# Patient Record
Sex: Female | Born: 1955 | Race: White | Hispanic: No | Marital: Married | Smoking: Former smoker
Health system: Southern US, Community
[De-identification: ages and names within clinical notes are randomized; demographics above are authoritative.]

## PROBLEM LIST (undated history)

## (undated) DIAGNOSIS — D649 Anemia, unspecified: Secondary | ICD-10-CM

## (undated) DIAGNOSIS — E538 Deficiency of other specified B group vitamins: Secondary | ICD-10-CM

## (undated) DIAGNOSIS — R0600 Dyspnea, unspecified: Secondary | ICD-10-CM

## (undated) DIAGNOSIS — Z9221 Personal history of antineoplastic chemotherapy: Secondary | ICD-10-CM

## (undated) DIAGNOSIS — T7840XA Allergy, unspecified, initial encounter: Secondary | ICD-10-CM

## (undated) DIAGNOSIS — J189 Pneumonia, unspecified organism: Secondary | ICD-10-CM

## (undated) DIAGNOSIS — Z8489 Family history of other specified conditions: Secondary | ICD-10-CM

## (undated) DIAGNOSIS — E063 Autoimmune thyroiditis: Secondary | ICD-10-CM

## (undated) DIAGNOSIS — M549 Dorsalgia, unspecified: Secondary | ICD-10-CM

## (undated) DIAGNOSIS — T451X5A Adverse effect of antineoplastic and immunosuppressive drugs, initial encounter: Secondary | ICD-10-CM

## (undated) DIAGNOSIS — K5790 Diverticulosis of intestine, part unspecified, without perforation or abscess without bleeding: Secondary | ICD-10-CM

## (undated) DIAGNOSIS — M545 Low back pain, unspecified: Secondary | ICD-10-CM

## (undated) DIAGNOSIS — F32A Depression, unspecified: Secondary | ICD-10-CM

## (undated) DIAGNOSIS — C3492 Malignant neoplasm of unspecified part of left bronchus or lung: Secondary | ICD-10-CM

## (undated) DIAGNOSIS — E039 Hypothyroidism, unspecified: Secondary | ICD-10-CM

## (undated) DIAGNOSIS — N952 Postmenopausal atrophic vaginitis: Secondary | ICD-10-CM

## (undated) DIAGNOSIS — F419 Anxiety disorder, unspecified: Secondary | ICD-10-CM

## (undated) DIAGNOSIS — R5383 Other fatigue: Secondary | ICD-10-CM

## (undated) DIAGNOSIS — Z720 Tobacco use: Secondary | ICD-10-CM

## (undated) DIAGNOSIS — M8580 Other specified disorders of bone density and structure, unspecified site: Secondary | ICD-10-CM

## (undated) DIAGNOSIS — F329 Major depressive disorder, single episode, unspecified: Secondary | ICD-10-CM

## (undated) DIAGNOSIS — Z8739 Personal history of other diseases of the musculoskeletal system and connective tissue: Secondary | ICD-10-CM

## (undated) DIAGNOSIS — K219 Gastro-esophageal reflux disease without esophagitis: Secondary | ICD-10-CM

## (undated) DIAGNOSIS — M1990 Unspecified osteoarthritis, unspecified site: Secondary | ICD-10-CM

## (undated) DIAGNOSIS — G620 Drug-induced polyneuropathy: Secondary | ICD-10-CM

## (undated) DIAGNOSIS — J449 Chronic obstructive pulmonary disease, unspecified: Secondary | ICD-10-CM

## (undated) DIAGNOSIS — E785 Hyperlipidemia, unspecified: Secondary | ICD-10-CM

## (undated) DIAGNOSIS — G4700 Insomnia, unspecified: Secondary | ICD-10-CM

## (undated) HISTORY — DX: Postmenopausal atrophic vaginitis: N95.2

## (undated) HISTORY — DX: Other fatigue: R53.83

## (undated) HISTORY — DX: Hypothyroidism, unspecified: E03.9

## (undated) HISTORY — DX: Allergy, unspecified, initial encounter: T78.40XA

## (undated) HISTORY — DX: Anemia, unspecified: D64.9

## (undated) HISTORY — DX: Chronic obstructive pulmonary disease, unspecified: J44.9

## (undated) HISTORY — DX: Tobacco use: Z72.0

## (undated) HISTORY — PX: ABDOMINAL HYSTERECTOMY: SHX81

## (undated) HISTORY — PX: ANKLE SURGERY: SHX546

## (undated) HISTORY — DX: Low back pain: M54.5

## (undated) HISTORY — DX: Insomnia, unspecified: G47.00

## (undated) HISTORY — DX: Low back pain, unspecified: M54.50

## (undated) HISTORY — DX: Depression, unspecified: F32.A

## (undated) HISTORY — DX: Other specified disorders of bone density and structure, unspecified site: M85.80

## (undated) HISTORY — PX: CHOLECYSTECTOMY: SHX55

## (undated) HISTORY — DX: Hyperlipidemia, unspecified: E78.5

## (undated) HISTORY — PX: HERNIA REPAIR: SHX51

## (undated) HISTORY — DX: Deficiency of other specified B group vitamins: E53.8

## (undated) HISTORY — DX: Major depressive disorder, single episode, unspecified: F32.9

## (undated) SURGERY — BRONCHOSCOPY, WITH EBUS
Anesthesia: Monitor Anesthesia Care | Laterality: Left

---

## 2005-06-17 ENCOUNTER — Ambulatory Visit: Payer: Self-pay | Admitting: Family Medicine

## 2005-09-04 ENCOUNTER — Ambulatory Visit (HOSPITAL_COMMUNITY): Admission: RE | Admit: 2005-09-04 | Discharge: 2005-09-05 | Payer: Self-pay | Admitting: General Surgery

## 2006-07-17 ENCOUNTER — Ambulatory Visit: Payer: Self-pay | Admitting: Family Medicine

## 2006-10-05 ENCOUNTER — Ambulatory Visit: Payer: Self-pay | Admitting: Gynecology

## 2007-09-27 ENCOUNTER — Ambulatory Visit: Payer: Self-pay | Admitting: Family Medicine

## 2007-11-09 ENCOUNTER — Ambulatory Visit: Payer: Self-pay | Admitting: Gastroenterology

## 2007-11-09 LAB — HM COLONOSCOPY

## 2007-11-12 ENCOUNTER — Ambulatory Visit: Payer: Self-pay | Admitting: Anesthesiology

## 2007-11-16 ENCOUNTER — Ambulatory Visit: Payer: Self-pay | Admitting: Anesthesiology

## 2007-11-24 ENCOUNTER — Ambulatory Visit: Payer: Self-pay | Admitting: Anesthesiology

## 2008-01-06 ENCOUNTER — Ambulatory Visit: Payer: Self-pay | Admitting: Anesthesiology

## 2008-02-11 ENCOUNTER — Ambulatory Visit: Payer: Self-pay | Admitting: Anesthesiology

## 2008-02-18 ENCOUNTER — Encounter: Payer: Self-pay | Admitting: Anesthesiology

## 2008-03-08 ENCOUNTER — Encounter: Payer: Self-pay | Admitting: Anesthesiology

## 2008-03-16 ENCOUNTER — Ambulatory Visit: Payer: Self-pay | Admitting: Anesthesiology

## 2008-04-18 ENCOUNTER — Ambulatory Visit: Payer: Self-pay | Admitting: Anesthesiology

## 2008-05-23 ENCOUNTER — Ambulatory Visit: Payer: Self-pay | Admitting: Anesthesiology

## 2008-06-12 ENCOUNTER — Ambulatory Visit: Payer: Self-pay | Admitting: Anesthesiology

## 2008-06-22 ENCOUNTER — Ambulatory Visit: Payer: Self-pay

## 2008-08-03 ENCOUNTER — Ambulatory Visit: Payer: Self-pay | Admitting: Anesthesiology

## 2008-09-18 ENCOUNTER — Ambulatory Visit: Payer: Self-pay | Admitting: Anesthesiology

## 2008-11-21 ENCOUNTER — Ambulatory Visit: Payer: Self-pay | Admitting: Anesthesiology

## 2008-12-20 ENCOUNTER — Ambulatory Visit: Payer: Self-pay | Admitting: Anesthesiology

## 2009-01-17 ENCOUNTER — Ambulatory Visit: Payer: Self-pay | Admitting: Anesthesiology

## 2009-02-15 ENCOUNTER — Ambulatory Visit: Payer: Self-pay | Admitting: Anesthesiology

## 2009-03-20 ENCOUNTER — Ambulatory Visit: Payer: Self-pay | Admitting: Anesthesiology

## 2009-04-17 ENCOUNTER — Ambulatory Visit: Payer: Self-pay | Admitting: Anesthesiology

## 2009-05-17 ENCOUNTER — Ambulatory Visit: Payer: Self-pay | Admitting: Anesthesiology

## 2009-06-12 ENCOUNTER — Ambulatory Visit: Payer: Self-pay | Admitting: Anesthesiology

## 2009-07-17 ENCOUNTER — Ambulatory Visit: Payer: Self-pay | Admitting: Anesthesiology

## 2009-08-23 ENCOUNTER — Ambulatory Visit: Payer: Self-pay | Admitting: Anesthesiology

## 2009-09-27 ENCOUNTER — Ambulatory Visit: Payer: Self-pay | Admitting: Anesthesiology

## 2009-11-14 ENCOUNTER — Ambulatory Visit: Payer: Self-pay | Admitting: Anesthesiology

## 2010-01-09 ENCOUNTER — Ambulatory Visit: Payer: Self-pay | Admitting: Anesthesiology

## 2010-03-04 ENCOUNTER — Ambulatory Visit: Payer: Self-pay | Admitting: Anesthesiology

## 2010-03-27 ENCOUNTER — Ambulatory Visit: Payer: Self-pay | Admitting: Anesthesiology

## 2010-04-30 ENCOUNTER — Ambulatory Visit: Payer: Self-pay | Admitting: Anesthesiology

## 2010-06-17 ENCOUNTER — Ambulatory Visit: Payer: Self-pay | Admitting: Anesthesiology

## 2010-08-15 ENCOUNTER — Ambulatory Visit: Payer: Self-pay | Admitting: Anesthesiology

## 2010-10-16 ENCOUNTER — Ambulatory Visit: Payer: Self-pay | Admitting: Anesthesiology

## 2010-12-17 ENCOUNTER — Ambulatory Visit: Payer: Self-pay | Admitting: Anesthesiology

## 2011-01-15 ENCOUNTER — Ambulatory Visit: Payer: Self-pay | Admitting: Anesthesiology

## 2011-02-05 ENCOUNTER — Ambulatory Visit: Payer: Self-pay | Admitting: Anesthesiology

## 2011-03-16 ENCOUNTER — Ambulatory Visit: Payer: Self-pay | Admitting: Unknown Physician Specialty

## 2011-04-03 ENCOUNTER — Ambulatory Visit: Payer: Self-pay | Admitting: Anesthesiology

## 2011-05-08 ENCOUNTER — Ambulatory Visit: Payer: Self-pay | Admitting: Pain Medicine

## 2011-05-19 ENCOUNTER — Ambulatory Visit: Payer: Self-pay | Admitting: Pain Medicine

## 2011-06-09 ENCOUNTER — Ambulatory Visit: Payer: Self-pay | Admitting: Pain Medicine

## 2011-06-16 ENCOUNTER — Ambulatory Visit: Payer: Self-pay | Admitting: Pain Medicine

## 2011-07-14 ENCOUNTER — Ambulatory Visit: Payer: Self-pay | Admitting: Pain Medicine

## 2011-08-12 ENCOUNTER — Ambulatory Visit: Payer: Self-pay | Admitting: Pain Medicine

## 2011-08-20 ENCOUNTER — Ambulatory Visit: Payer: Self-pay | Admitting: Pain Medicine

## 2011-09-15 ENCOUNTER — Ambulatory Visit: Payer: Self-pay | Admitting: Pain Medicine

## 2011-10-14 ENCOUNTER — Ambulatory Visit: Payer: Self-pay | Admitting: Pain Medicine

## 2011-10-20 ENCOUNTER — Ambulatory Visit: Payer: Self-pay | Admitting: Pain Medicine

## 2011-11-17 ENCOUNTER — Ambulatory Visit: Payer: Self-pay | Admitting: Pain Medicine

## 2011-12-22 ENCOUNTER — Ambulatory Visit: Payer: Self-pay | Admitting: Pain Medicine

## 2012-01-12 ENCOUNTER — Ambulatory Visit: Payer: Self-pay | Admitting: Pain Medicine

## 2012-02-16 ENCOUNTER — Ambulatory Visit: Payer: Self-pay | Admitting: Pain Medicine

## 2012-02-18 ENCOUNTER — Ambulatory Visit: Payer: Self-pay | Admitting: Pain Medicine

## 2012-03-11 ENCOUNTER — Ambulatory Visit: Payer: Self-pay | Admitting: Pain Medicine

## 2012-03-22 ENCOUNTER — Ambulatory Visit: Payer: Self-pay | Admitting: Pain Medicine

## 2012-04-08 ENCOUNTER — Ambulatory Visit: Payer: Self-pay | Admitting: Pain Medicine

## 2012-04-19 ENCOUNTER — Ambulatory Visit: Payer: Self-pay | Admitting: Pain Medicine

## 2012-05-12 ENCOUNTER — Ambulatory Visit: Payer: Self-pay | Admitting: Pain Medicine

## 2012-06-08 ENCOUNTER — Ambulatory Visit: Payer: Self-pay | Admitting: Pain Medicine

## 2012-06-14 ENCOUNTER — Ambulatory Visit: Payer: Self-pay | Admitting: Pain Medicine

## 2012-07-06 ENCOUNTER — Ambulatory Visit: Payer: Self-pay | Admitting: Pain Medicine

## 2012-07-14 ENCOUNTER — Ambulatory Visit: Payer: Self-pay | Admitting: Pain Medicine

## 2012-08-03 ENCOUNTER — Ambulatory Visit: Payer: Self-pay | Admitting: Pain Medicine

## 2012-08-16 ENCOUNTER — Ambulatory Visit: Payer: Self-pay | Admitting: Pain Medicine

## 2012-08-25 ENCOUNTER — Ambulatory Visit: Payer: Self-pay | Admitting: Pain Medicine

## 2012-09-22 ENCOUNTER — Ambulatory Visit: Payer: Self-pay | Admitting: Pain Medicine

## 2012-09-30 ENCOUNTER — Ambulatory Visit: Payer: Self-pay | Admitting: Pain Medicine

## 2012-10-11 ENCOUNTER — Ambulatory Visit: Payer: Self-pay | Admitting: Pain Medicine

## 2012-11-02 ENCOUNTER — Ambulatory Visit: Payer: Self-pay | Admitting: Pain Medicine

## 2012-11-15 ENCOUNTER — Ambulatory Visit: Payer: Self-pay | Admitting: Pain Medicine

## 2012-11-29 ENCOUNTER — Ambulatory Visit: Payer: Self-pay | Admitting: Pain Medicine

## 2012-12-22 ENCOUNTER — Ambulatory Visit: Payer: Self-pay | Admitting: Pain Medicine

## 2013-01-27 ENCOUNTER — Ambulatory Visit: Payer: Self-pay | Admitting: Pain Medicine

## 2013-02-07 ENCOUNTER — Ambulatory Visit: Payer: Self-pay | Admitting: Pain Medicine

## 2013-03-03 ENCOUNTER — Ambulatory Visit: Payer: Self-pay | Admitting: Pain Medicine

## 2013-03-14 ENCOUNTER — Ambulatory Visit: Payer: Self-pay | Admitting: Pain Medicine

## 2013-03-31 ENCOUNTER — Ambulatory Visit: Payer: Self-pay | Admitting: Pain Medicine

## 2013-04-11 ENCOUNTER — Ambulatory Visit: Payer: Self-pay | Admitting: Pain Medicine

## 2013-05-03 ENCOUNTER — Ambulatory Visit: Payer: Self-pay | Admitting: Pain Medicine

## 2013-05-09 ENCOUNTER — Ambulatory Visit: Payer: Self-pay | Admitting: Pain Medicine

## 2013-06-02 ENCOUNTER — Ambulatory Visit: Payer: Self-pay | Admitting: Pain Medicine

## 2013-06-13 ENCOUNTER — Ambulatory Visit: Payer: Self-pay | Admitting: Pain Medicine

## 2013-06-30 ENCOUNTER — Ambulatory Visit: Payer: Self-pay | Admitting: Pain Medicine

## 2013-07-13 ENCOUNTER — Ambulatory Visit: Payer: Self-pay | Admitting: Pain Medicine

## 2013-07-28 ENCOUNTER — Ambulatory Visit: Payer: Self-pay | Admitting: Pain Medicine

## 2013-08-30 ENCOUNTER — Ambulatory Visit: Payer: Self-pay | Admitting: Pain Medicine

## 2013-09-07 ENCOUNTER — Ambulatory Visit: Payer: Self-pay | Admitting: Pain Medicine

## 2013-09-29 ENCOUNTER — Ambulatory Visit: Payer: Self-pay | Admitting: Pain Medicine

## 2013-10-25 ENCOUNTER — Ambulatory Visit: Payer: Self-pay

## 2013-10-25 LAB — HM MAMMOGRAPHY

## 2014-03-08 HISTORY — PX: CARPAL TUNNEL RELEASE: SHX101

## 2015-03-28 ENCOUNTER — Other Ambulatory Visit: Payer: Self-pay | Admitting: Unknown Physician Specialty

## 2015-03-28 DIAGNOSIS — N951 Menopausal and female climacteric states: Secondary | ICD-10-CM

## 2015-03-28 DIAGNOSIS — Z1231 Encounter for screening mammogram for malignant neoplasm of breast: Secondary | ICD-10-CM

## 2015-04-03 ENCOUNTER — Other Ambulatory Visit: Payer: Self-pay | Admitting: Unknown Physician Specialty

## 2015-04-03 DIAGNOSIS — Z780 Asymptomatic menopausal state: Secondary | ICD-10-CM

## 2015-04-03 DIAGNOSIS — N951 Menopausal and female climacteric states: Secondary | ICD-10-CM

## 2015-04-17 ENCOUNTER — Ambulatory Visit: Payer: Self-pay

## 2015-04-24 ENCOUNTER — Ambulatory Visit: Payer: Self-pay

## 2015-05-03 ENCOUNTER — Ambulatory Visit: Payer: Managed Care, Other (non HMO) | Attending: Unknown Physician Specialty

## 2015-08-14 ENCOUNTER — Other Ambulatory Visit: Payer: Self-pay

## 2015-08-14 MED ORDER — ZOLPIDEM TARTRATE 10 MG PO TABS: 10.0000 mg | ORAL_TABLET | Freq: Every day | ORAL | Status: DC

## 2015-08-14 NOTE — Telephone Encounter (Signed)
Patient called stating that she needs a refill on Ambien. Patient has appointment scheduled on 09/10/15.

## 2015-08-15 ENCOUNTER — Other Ambulatory Visit: Payer: Self-pay | Admitting: Unknown Physician Specialty

## 2015-08-17 ENCOUNTER — Telehealth: Payer: Self-pay | Admitting: Unknown Physician Specialty

## 2015-08-17 NOTE — Telephone Encounter (Signed)
done

## 2015-09-10 ENCOUNTER — Ambulatory Visit (INDEPENDENT_AMBULATORY_CARE_PROVIDER_SITE_OTHER): Payer: Managed Care, Other (non HMO) | Admitting: Unknown Physician Specialty

## 2015-09-10 ENCOUNTER — Encounter: Payer: Self-pay | Admitting: Unknown Physician Specialty

## 2015-09-10 VITALS — BP 113/73 | HR 120 | Temp 98.9°F | Ht 64.2 in | Wt 159.2 lb

## 2015-09-10 DIAGNOSIS — E538 Deficiency of other specified B group vitamins: Secondary | ICD-10-CM

## 2015-09-10 DIAGNOSIS — F329 Major depressive disorder, single episode, unspecified: Secondary | ICD-10-CM | POA: Insufficient documentation

## 2015-09-10 DIAGNOSIS — E039 Hypothyroidism, unspecified: Secondary | ICD-10-CM | POA: Insufficient documentation

## 2015-09-10 DIAGNOSIS — J029 Acute pharyngitis, unspecified: Secondary | ICD-10-CM

## 2015-09-10 DIAGNOSIS — J0100 Acute maxillary sinusitis, unspecified: Secondary | ICD-10-CM

## 2015-09-10 DIAGNOSIS — N3941 Urge incontinence: Secondary | ICD-10-CM

## 2015-09-10 DIAGNOSIS — E785 Hyperlipidemia, unspecified: Secondary | ICD-10-CM

## 2015-09-10 DIAGNOSIS — D510 Vitamin B12 deficiency anemia due to intrinsic factor deficiency: Secondary | ICD-10-CM

## 2015-09-10 DIAGNOSIS — G8929 Other chronic pain: Secondary | ICD-10-CM

## 2015-09-10 DIAGNOSIS — Z720 Tobacco use: Secondary | ICD-10-CM

## 2015-09-10 DIAGNOSIS — N952 Postmenopausal atrophic vaginitis: Secondary | ICD-10-CM

## 2015-09-10 DIAGNOSIS — I10 Essential (primary) hypertension: Secondary | ICD-10-CM | POA: Insufficient documentation

## 2015-09-10 DIAGNOSIS — M8580 Other specified disorders of bone density and structure, unspecified site: Secondary | ICD-10-CM | POA: Insufficient documentation

## 2015-09-10 DIAGNOSIS — M545 Low back pain, unspecified: Secondary | ICD-10-CM | POA: Insufficient documentation

## 2015-09-10 DIAGNOSIS — G4700 Insomnia, unspecified: Secondary | ICD-10-CM | POA: Diagnosis not present

## 2015-09-10 DIAGNOSIS — R5382 Chronic fatigue, unspecified: Secondary | ICD-10-CM

## 2015-09-10 DIAGNOSIS — R5383 Other fatigue: Secondary | ICD-10-CM | POA: Insufficient documentation

## 2015-09-10 DIAGNOSIS — F322 Major depressive disorder, single episode, severe without psychotic features: Secondary | ICD-10-CM

## 2015-09-10 DIAGNOSIS — E8881 Metabolic syndrome: Secondary | ICD-10-CM

## 2015-09-10 DIAGNOSIS — J449 Chronic obstructive pulmonary disease, unspecified: Secondary | ICD-10-CM

## 2015-09-10 DIAGNOSIS — F32A Depression, unspecified: Secondary | ICD-10-CM

## 2015-09-10 LAB — MICROALBUMIN, URINE WAIVED
CREATININE, URINE WAIVED: 50 mg/dL (ref 10–300)
MICROALB, UR WAIVED: 30 mg/L — AB (ref 0–19)

## 2015-09-10 MED ORDER — AMOXICILLIN-POT CLAVULANATE 875-125 MG PO TABS: 1.0000 | ORAL_TABLET | Freq: Two times a day (BID) | ORAL | Status: DC

## 2015-09-10 MED ORDER — CYANOCOBALAMIN 1000 MCG/ML IJ SOLN: 1000.0000 ug | Freq: Once | INTRAMUSCULAR | Status: DC

## 2015-09-10 MED ORDER — METHOCARBAMOL 750 MG PO TABS: 750.0000 mg | ORAL_TABLET | Freq: Four times a day (QID) | ORAL | Status: DC

## 2015-09-10 MED ORDER — ZOLPIDEM TARTRATE 10 MG PO TABS: 10.0000 mg | ORAL_TABLET | Freq: Every day | ORAL | Status: DC

## 2015-09-10 NOTE — Assessment & Plan Note (Signed)
Check TSH 

## 2015-09-10 NOTE — Assessment & Plan Note (Signed)
Stable, continue present medications. Stable, continue present medications.

## 2015-09-10 NOTE — Assessment & Plan Note (Signed)
Stable, continue present medications.   

## 2015-09-10 NOTE — Assessment & Plan Note (Signed)
Needs refill of B12

## 2015-09-10 NOTE — Progress Notes (Signed)
BP 113/73 mmHg  Pulse 120  Temp(Src) 98.9 F (37.2 C)  Ht 5' 4.2" (1.631 m)  Wt 159 lb 3.2 oz (72.213 kg)  BMI 27.15 kg/m2  SpO2 96%  LMP  (LMP Unknown)   Subjective:    Patient ID: Jill Gross, female    DOB: 07/27/1956, 59 y.o.   MRN: [id number]  HPI: Jill Gross is a 59 y.o. female  Chief Complaint  Patient presents with  . Hyperlipidemia  . Hypertension  . Depression  . Sore Throat    pt states sore throat started about 2 weeks ago. States it started as sore throat, went to sinuses, and now back to sore throat. States it is also messing with her ears.   Hyperlipidemia This is a chronic problem. The problem is controlled. Exacerbating diseases include hypothyroidism. Pertinent negatives include no chest pain, myalgias or shortness of breath. There are no known risk factors for coronary artery disease.  Hypertension This is a chronic problem. The problem is unchanged. The problem is controlled. Pertinent negatives include no chest pain, palpitations or shortness of breath.  Depression        This is a chronic problem.  The problem has been resolved since onset.  Associated symptoms include insomnia.  Associated symptoms include no decreased concentration, no fatigue, no helplessness, no hopelessness, not irritable, no restlessness, no decreased interest, no appetite change, no body aches, no myalgias, no indigestion, not sad and no suicidal ideas.  Compliance with treatment is good.  Past medical history includes hypothyroidism.   Cough This is a new problem. The current episode started in the past 7 days. The problem has been rapidly worsening. The problem occurs constantly. The cough is productive of purulent sputum. Associated symptoms include nasal congestion, postnasal drip, rhinorrhea and a sore throat. Pertinent negatives include no chest pain, myalgias or shortness of breath. Nothing aggravates the symptoms. She has tried nothing for the symptoms.   Insomnia States  she "does not sleep" confirmed by husband.  She takes 5-10 mg of Ambien nightly.    Relevant past medical, surgical, family and social history reviewed and updated as indicated. Interim medical history since our last visit reviewed. Allergies and medications reviewed and updated.  Review of Systems  Constitutional: Negative for appetite change and fatigue.  HENT: Positive for postnasal drip, rhinorrhea and sore throat.   Respiratory: Positive for cough. Negative for shortness of breath.   Cardiovascular: Negative for chest pain and palpitations.  Musculoskeletal: Negative for myalgias.  Psychiatric/Behavioral: Positive for depression. Negative for suicidal ideas and decreased concentration. The patient has insomnia.     Per HPI unless specifically indicated above     Objective:    BP 113/73 mmHg  Pulse 120  Temp(Src) 98.9 F (37.2 C)  Ht 5' 4.2" (1.631 m)  Wt 159 lb 3.2 oz (72.213 kg)  BMI 27.15 kg/m2  SpO2 96%  LMP  (LMP Unknown)  Wt Readings from Last 3 Encounters:  09/10/15 159 lb 3.2 oz (72.213 kg)  03/27/15 160 lb (72.576 kg)    Physical Exam  Constitutional: She is oriented to person, place, and time. She appears well-developed and well-nourished. She is not irritable. No distress.  HENT:  Head: Normocephalic and atraumatic.  Right Ear: Tympanic membrane and ear canal normal.  Left Ear: Tympanic membrane and ear canal normal.  Nose: No rhinorrhea. Right sinus exhibits maxillary sinus tenderness. Right sinus exhibits no frontal sinus tenderness. Left sinus exhibits maxillary sinus tenderness. Left sinus exhibits  no frontal sinus tenderness.  Eyes: Conjunctivae and lids are normal. Right eye exhibits no discharge. Left eye exhibits no discharge. No scleral icterus.  Cardiovascular: Normal rate and regular rhythm.   Pulmonary/Chest: Effort normal and breath sounds normal. No respiratory distress.  Abdominal: Soft. Normal appearance and bowel sounds are normal. There is no  splenomegaly or hepatomegaly.  Musculoskeletal: Normal range of motion.  Neurological: She is alert and oriented to person, place, and time.  Skin: Skin is warm, dry and intact. No rash noted. No pallor.  Psychiatric: She has a normal mood and affect. Her behavior is normal. Judgment and thought content normal.  Nursing note and vitals reviewed.   Results for orders placed or performed in visit on 09/10/15  Strep Gp A Ag, IA W/Reflex  Result Value Ref Range   Strep Gp A Ag, IA W/Reflex Negative Negative      Assessment & Plan:   Problem List Items Addressed This Visit      Unprioritized   Hypothyroidism    Check TSH      Relevant Orders   TSH   Insomnia    Pt requires Ambien to help her sleep.  She has been on it long-term.  Encouraged alternatives.  Will refill the Ambien      Low back pain    Will try Robaxin for spasms.        Relevant Medications   methocarbamol (ROBAXIN-750) 750 MG tablet   Hyperlipidemia    Stable, continue present medications.       Relevant Orders   Lipid Panel w/o Chol/HDL Ratio   Hypertension    Stable, continue present medications. Stable, continue present medications.       Relevant Orders   Comprehensive metabolic panel   Microalbumin, Urine Waived   Uric acid   Vitamin B12 deficiency    Needs refill of B12      Relevant Orders   Vitamin B12    Other Visit Diagnoses    Sore throat    -  Primary    Relevant Orders    Strep Gp A Ag, IA W/Reflex (Completed)    Acute maxillary sinusitis, recurrence not specified        Relevant Medications    amoxicillin-clavulanate (AUGMENTIN) 875-125 MG tablet        Follow up plan: Return in about 6 months (around 03/10/2016).

## 2015-09-10 NOTE — Assessment & Plan Note (Signed)
Will try Robaxin for spasms.

## 2015-09-10 NOTE — Assessment & Plan Note (Signed)
Pt requires Ambien to help her sleep.  She has been on it long-term.  Encouraged alternatives.  Will refill the Ambien

## 2015-09-11 ENCOUNTER — Encounter: Payer: Self-pay | Admitting: Unknown Physician Specialty

## 2015-09-11 LAB — COMPREHENSIVE METABOLIC PANEL
ALBUMIN: 4.2 g/dL (ref 3.5–5.5)
ALK PHOS: 118 IU/L — AB (ref 39–117)
ALT: 15 IU/L (ref 0–32)
AST: 18 IU/L (ref 0–40)
Albumin/Globulin Ratio: 1.6 (ref 1.1–2.5)
BUN / CREAT RATIO: 8 — AB (ref 9–23)
BUN: 6 mg/dL (ref 6–24)
Bilirubin Total: 0.4 mg/dL (ref 0.0–1.2)
CO2: 24 mmol/L (ref 18–29)
CREATININE: 0.79 mg/dL (ref 0.57–1.00)
Calcium: 9.2 mg/dL (ref 8.7–10.2)
Chloride: 93 mmol/L — ABNORMAL LOW (ref 97–108)
GFR calc non Af Amer: 82 mL/min/{1.73_m2} (ref >59)
GFR, EST AFRICAN AMERICAN: 95 mL/min/{1.73_m2} (ref >59)
GLOBULIN, TOTAL: 2.7 g/dL (ref 1.5–4.5)
Glucose: 96 mg/dL (ref 65–99)
Potassium: 4 mmol/L (ref 3.5–5.2)
SODIUM: 135 mmol/L (ref 134–144)
TOTAL PROTEIN: 6.9 g/dL (ref 6.0–8.5)

## 2015-09-11 LAB — TSH: TSH: 2.1 u[IU]/mL (ref 0.450–4.500)

## 2015-09-11 LAB — LIPID PANEL W/O CHOL/HDL RATIO
Cholesterol, Total: 141 mg/dL (ref 100–199)
HDL: 30 mg/dL — AB (ref >39)
LDL Calculated: 76 mg/dL (ref 0–99)
TRIGLYCERIDES: 173 mg/dL — AB (ref 0–149)
VLDL Cholesterol Cal: 35 mg/dL (ref 5–40)

## 2015-09-11 LAB — URIC ACID: Uric Acid: 4.9 mg/dL (ref 2.5–7.1)

## 2015-09-11 LAB — VITAMIN B12: Vitamin B-12: 511 pg/mL (ref 211–946)

## 2015-09-12 ENCOUNTER — Telehealth: Payer: Self-pay | Admitting: Unknown Physician Specialty

## 2015-09-12 LAB — STREP GP A AG, IA W/REFLEX: Strep A Culture: NEGATIVE

## 2015-09-12 MED ORDER — AZITHROMYCIN 250 MG PO TABS: ORAL_TABLET | ORAL | Status: DC

## 2015-09-12 MED ORDER — HYDROCOD POLST-CPM POLST ER 10-8 MG/5ML PO SUER: 5.0000 mL | Freq: Two times a day (BID) | ORAL | Status: DC | PRN

## 2015-09-12 NOTE — Telephone Encounter (Signed)
Called and left patient a voicemail asking for her to please return my call.  

## 2015-09-12 NOTE — Telephone Encounter (Signed)
Pt called and asked that you call her back. Thanks.

## 2015-09-12 NOTE — Telephone Encounter (Signed)
Wrote for a Zpack and sent.  Tussionex needs to be picked up

## 2015-09-12 NOTE — Telephone Encounter (Signed)
Pt called in and would like to have another antibiotic because the one she was given makes her throw up. She would also like a form of cough syrup if possible.

## 2015-09-12 NOTE — Telephone Encounter (Signed)
Routing to provider. Patient was seen 09/10/15 and pharmacy is [facility] in [place].

## 2015-09-12 NOTE — Telephone Encounter (Signed)
Called patient back and let her know about her prescriptions.

## 2015-09-20 ENCOUNTER — Telehealth: Payer: Self-pay | Admitting: Unknown Physician Specialty

## 2015-09-20 NOTE — Telephone Encounter (Signed)
pts husband called and stated that she cant talk over a wisper and it doesn't sem like the antibiotics and cough syrup are working. They would like to know if she needs to come back in or if something else could be called in to [facility] in [place]

## 2015-09-21 ENCOUNTER — Telehealth: Payer: Self-pay

## 2015-09-21 ENCOUNTER — Ambulatory Visit: Payer: Self-pay | Admitting: Unknown Physician Specialty

## 2015-09-21 NOTE — Telephone Encounter (Signed)
There is a stomach virus going around.  Best to drink fluids.  If she is not able to hold them down, plus being sick, she should go to the ER as ? Dehydration.  I don't want to give her extra antibiotics without examining her as it might make her worse.

## 2015-09-21 NOTE — Telephone Encounter (Signed)
Jill Gross, since she no showed, can you call her and see if she is OK?

## 2015-09-21 NOTE — Telephone Encounter (Signed)
Called patient again and explained if she continued to vomit with diarrhea to go to the ER for dehydration. Explained to get plenty of rest and drink plenty of fluids. She said that if her voice/throat was still the same Monday, she was going call and make an appointment. I told her to try to talk as little as possible, and even though she's hoarse to whisper. Not to strain her vocal chords. I told her to call Monday if no better.

## 2015-09-21 NOTE — Telephone Encounter (Signed)
I called patient and asked her if she was ok because she didn't come to her appointment today. Patient was very hoarse on the phone. She explained that she vomited all night and had diarrhea this morning, and that's why she didn't come. I asked her if she was still vomiting with diarrhea and she said no. She explained that she was still taking the Z-Pack. I told her that if there was anything the doctor wanted to do, I'd call her back. And told her to feel better.

## 2015-09-25 ENCOUNTER — Ambulatory Visit (INDEPENDENT_AMBULATORY_CARE_PROVIDER_SITE_OTHER): Payer: Managed Care, Other (non HMO) | Admitting: Unknown Physician Specialty

## 2015-09-25 ENCOUNTER — Encounter: Payer: Self-pay | Admitting: Unknown Physician Specialty

## 2015-09-25 VITALS — BP 152/85 | HR 99 | Temp 98.4°F | Ht 64.5 in | Wt 159.2 lb

## 2015-09-25 DIAGNOSIS — Z23 Encounter for immunization: Secondary | ICD-10-CM | POA: Diagnosis not present

## 2015-09-25 DIAGNOSIS — J040 Acute laryngitis: Secondary | ICD-10-CM

## 2015-09-25 MED ORDER — PREDNISONE 20 MG PO TABS: 20.0000 mg | ORAL_TABLET | Freq: Every day | ORAL | Status: DC

## 2015-09-25 NOTE — Progress Notes (Signed)
BP 152/85 mmHg  Pulse 99  Temp(Src) 98.4 F (36.9 C)  Ht 5' 4.5" (1.638 m)  Wt 159 lb 3.2 oz (72.213 kg)  BMI 26.91 kg/m2  SpO2 98%  LMP  (LMP Unknown)   Subjective:    Patient ID: Jill Gross, female    DOB: [birth year], 60 y.o.   MRN: [id number]  HPI: Jill Gross is a 59 y.o. female  Chief Complaint  Patient presents with  . Speech Problem    pt states she is still having trouble talking, says her voice comes and goes. But states throat does not hurt    Pt with complaints of continued laryngitis.  She has been out of work.  Other cold and sinus symptoms I saw her for before are improved with no cough.  Still feels she has a lot of mucous in the back of her throat.    Relevant past medical, surgical, family and social history reviewed and updated as indicated. Interim medical history since our last visit reviewed. Allergies and medications reviewed and updated.  Review of Systems  Per HPI unless specifically indicated above     Objective:    BP 152/85 mmHg  Pulse 99  Temp(Src) 98.4 F (36.9 C)  Ht 5' 4.5" (1.638 m)  Wt 159 lb 3.2 oz (72.213 kg)  BMI 26.91 kg/m2  SpO2 98%  LMP  (LMP Unknown)  Wt Readings from Last 3 Encounters:  09/25/15 159 lb 3.2 oz (72.213 kg)  09/10/15 159 lb 3.2 oz (72.213 kg)  03/27/15 160 lb (72.576 kg)    Physical Exam  Constitutional: She is oriented to person, place, and time. She appears well-developed and well-nourished. No distress.  HENT:  Head: Normocephalic and atraumatic.  Nose: Nose normal.  Mouth/Throat: Uvula is midline, oropharynx is clear and moist and mucous membranes are normal.  Eyes: Conjunctivae and lids are normal. Right eye exhibits no discharge. Left eye exhibits no discharge. No scleral icterus.  Cardiovascular: Normal rate, regular rhythm and normal heart sounds.   Pulmonary/Chest: Effort normal and breath sounds normal. No respiratory distress.  Abdominal: Normal appearance. There is no splenomegaly or  hepatomegaly.  Musculoskeletal: Normal range of motion.  Neurological: She is alert and oriented to person, place, and time.  Skin: Skin is intact. No rash noted. No pallor.  Psychiatric: She has a normal mood and affect. Her behavior is normal. Judgment and thought content normal.    Results for orders placed or performed in visit on 09/10/15  Strep Gp A Ag, IA W/Reflex  Result Value Ref Range   Strep A Culture Negative   TSH  Result Value Ref Range   TSH 2.100 0.450 - 4.500 uIU/mL  Lipid Panel w/o Chol/HDL Ratio  Result Value Ref Range   Cholesterol, Total 141 100 - 199 mg/dL   Triglycerides 173 (H) 0 - 149 mg/dL   HDL 30 (L) >39 mg/dL   VLDL Cholesterol Cal 35 5 - 40 mg/dL   LDL Calculated 76 0 - 99 mg/dL  Comprehensive metabolic panel  Result Value Ref Range   Glucose 96 65 - 99 mg/dL   BUN 6 6 - 24 mg/dL   Creatinine, Ser 0.79 0.57 - 1.00 mg/dL   GFR calc non Af Amer 82 >59 mL/min/1.73   GFR calc Af Amer 95 >59 mL/min/1.73   BUN/Creatinine Ratio 8 (L) 9 - 23   Sodium 135 134 - 144 mmol/L   Potassium 4.0 3.5 - 5.2 mmol/L   Chloride 93 (  L) 97 - 108 mmol/L   CO2 24 18 - 29 mmol/L   Calcium 9.2 8.7 - 10.2 mg/dL   Total Protein 6.9 6.0 - 8.5 g/dL   Albumin 4.2 3.5 - 5.5 g/dL   Globulin, Total 2.7 1.5 - 4.5 g/dL   Albumin/Globulin Ratio 1.6 1.1 - 2.5   Bilirubin Total 0.4 0.0 - 1.2 mg/dL   Alkaline Phosphatase 118 (H) 39 - 117 IU/L   AST 18 0 - 40 IU/L   ALT 15 0 - 32 IU/L  Microalbumin, Urine Waived  Result Value Ref Range   Microalb, Ur Waived 30 (H) 0 - 19 mg/L   Creatinine, Urine Waived 50 10 - 300 mg/dL   Microalb/Creat Ratio 30-300 (H) <30 mg/g  Uric acid  Result Value Ref Range   Uric Acid 4.9 2.5 - 7.1 mg/dL  Vitamin B12  Result Value Ref Range   Vitamin B-12 511 211 - 946 pg/mL      Assessment & Plan:   Problem List Items Addressed This Visit    None    Visit Diagnoses    Laryngitis    -  Primary    Will rx a steroid to see if it will help.  No  further antibiotics are needed.  Will refer to ENT for further evaluation os laryngitis is ongoing for 3 weeks.      Relevant Orders    Ambulatory referral to ENT        Follow up plan: Return for with ENT.

## 2015-12-06 ENCOUNTER — Other Ambulatory Visit: Payer: Self-pay | Admitting: Unknown Physician Specialty

## 2015-12-26 ENCOUNTER — Ambulatory Visit
Admission: RE | Admit: 2015-12-26 | Discharge: 2015-12-26 | Disposition: A | Discharge status: Home / Self Care | Payer: Managed Care, Other (non HMO) | Source: Ambulatory Visit | Attending: Unknown Physician Specialty | Admitting: Unknown Physician Specialty

## 2015-12-26 ENCOUNTER — Telehealth: Payer: Self-pay | Admitting: Unknown Physician Specialty

## 2015-12-26 ENCOUNTER — Encounter: Payer: Self-pay | Admitting: Unknown Physician Specialty

## 2015-12-26 ENCOUNTER — Ambulatory Visit (INDEPENDENT_AMBULATORY_CARE_PROVIDER_SITE_OTHER): Payer: Managed Care, Other (non HMO) | Admitting: Unknown Physician Specialty

## 2015-12-26 VITALS — BP 140/83 | HR 116 | Temp 97.8°F | Ht 63.1 in | Wt 153.4 lb

## 2015-12-26 DIAGNOSIS — R042 Hemoptysis: Secondary | ICD-10-CM | POA: Insufficient documentation

## 2015-12-26 DIAGNOSIS — R918 Other nonspecific abnormal finding of lung field: Secondary | ICD-10-CM | POA: Insufficient documentation

## 2015-12-26 MED ORDER — LEVOFLOXACIN 750 MG PO TABS: 750.0000 mg | ORAL_TABLET | Freq: Every day | ORAL | Status: DC

## 2015-12-26 NOTE — Progress Notes (Signed)
BP 140/83 mmHg  Pulse 116  Temp(Src) 97.8 F (36.6 C)  Ht 5' 3.1" (1.603 m)  Wt 153 lb 6.4 oz (69.582 kg)  BMI 27.08 kg/m2  SpO2 96%  LMP  (LMP Unknown)   Subjective:    Patient ID: Jill Gross, female    DOB: October 19, 1956, 60 y.o.   MRN: [id number]  HPI: Jill Gross is a 60 y.o. female  Chief Complaint  Patient presents with  . Cough    pt states she has been coughing up blood and has a pain in the middle of her chest that started Saturday. States she has been to ENT.   This started on Saturday.  States she put her cigarettes down when she started coughing up blood.  She had been to ENT for w/u of sinus drainage.  Had "a light" down her throat.  SOB at times.  Chest pain with cough.  States she is having "sweats" and lost about 6 pounds from last visit.  Very fatigued with no energy.    Relevant past medical, surgical, family and social history reviewed and updated as indicated. Interim medical history since our last visit reviewed. Allergies and medications reviewed and updated.  Review of Systems  Per HPI unless specifically indicated above     Objective:    BP 140/83 mmHg  Pulse 116  Temp(Src) 97.8 F (36.6 C)  Ht 5' 3.1" (1.603 m)  Wt 153 lb 6.4 oz (69.582 kg)  BMI 27.08 kg/m2  SpO2 96%  LMP  (LMP Unknown)  Wt Readings from Last 3 Encounters:  12/26/15 153 lb 6.4 oz (69.582 kg)  09/25/15 159 lb 3.2 oz (72.213 kg)  09/10/15 159 lb 3.2 oz (72.213 kg)    Physical Exam  Constitutional: She is oriented to person, place, and time. She appears well-developed and well-nourished. No distress.  HENT:  Head: Normocephalic and atraumatic.  Eyes: Conjunctivae and lids are normal. Right eye exhibits no discharge. Left eye exhibits no discharge. No scleral icterus.  Neck: Normal range of motion. Neck supple. No JVD present. Carotid bruit is not present.  Cardiovascular: Normal rate, regular rhythm and normal heart sounds.   Pulmonary/Chest: Effort normal. She has  decreased breath sounds. She has wheezes in the right middle field. She has rhonchi in the right lower field.  Abdominal: Normal appearance. There is no splenomegaly or hepatomegaly.  Musculoskeletal: Normal range of motion.  Neurological: She is alert and oriented to person, place, and time.  Skin: Skin is warm, dry and intact. No rash noted. No pallor.  Psychiatric: She has a normal mood and affect. Her behavior is normal. Judgment and thought content normal.    Results for orders placed or performed in visit on 09/10/15  Strep Gp A Ag, IA W/Reflex  Result Value Ref Range   Strep A Culture Negative   TSH  Result Value Ref Range   TSH 2.100 0.450 - 4.500 uIU/mL  Lipid Panel w/o Chol/HDL Ratio  Result Value Ref Range   Cholesterol, Total 141 100 - 199 mg/dL   Triglycerides 173 (H) 0 - 149 mg/dL   HDL 30 (L) >39 mg/dL   VLDL Cholesterol Cal 35 5 - 40 mg/dL   LDL Calculated 76 0 - 99 mg/dL  Comprehensive metabolic panel  Result Value Ref Range   Glucose 96 65 - 99 mg/dL   BUN 6 6 - 24 mg/dL   Creatinine, Ser 0.79 0.57 - 1.00 mg/dL   GFR calc non Af Wyvonnia Lora  82 >59 mL/min/1.73   GFR calc Af Amer 95 >59 mL/min/1.73   BUN/Creatinine Ratio 8 (L) 9 - 23   Sodium 135 134 - 144 mmol/L   Potassium 4.0 3.5 - 5.2 mmol/L   Chloride 93 (L) 97 - 108 mmol/L   CO2 24 18 - 29 mmol/L   Calcium 9.2 8.7 - 10.2 mg/dL   Total Protein 6.9 6.0 - 8.5 g/dL   Albumin 4.2 3.5 - 5.5 g/dL   Globulin, Total 2.7 1.5 - 4.5 g/dL   Albumin/Globulin Ratio 1.6 1.1 - 2.5   Bilirubin Total 0.4 0.0 - 1.2 mg/dL   Alkaline Phosphatase 118 (H) 39 - 117 IU/L   AST 18 0 - 40 IU/L   ALT 15 0 - 32 IU/L  Microalbumin, Urine Waived  Result Value Ref Range   Microalb, Ur Waived 30 (H) 0 - 19 mg/L   Creatinine, Urine Waived 50 10 - 300 mg/dL   Microalb/Creat Ratio 30-300 (H) <30 mg/g  Uric acid  Result Value Ref Range   Uric Acid 4.9 2.5 - 7.1 mg/dL  Vitamin B12  Result Value Ref Range   Vitamin B-12 511 211 - 946  pg/mL      Assessment & Plan:   Problem List Items Addressed This Visit    None    Visit Diagnoses    Hemoptysis    -  Primary    Relevant Medications    aspirin 81 MG tablet    Other Relevant Orders    DG Chest 2 View       Will get chest x-ray.  If negative, get CT scan in this 1-2 pack/day smoker with a 40 plus pack year history     Follow up plan: Return for 2 days.

## 2015-12-26 NOTE — Addendum Note (Signed)
Addended by: [name] on: 12/26/2015 03:04 PM   Modules accepted: Orders

## 2015-12-26 NOTE — Telephone Encounter (Signed)
Called patient to discuss CT scan.  Middle right lung infiltrate vs mass.  Treat with Levaquin and also get contrast enhanced CT scan

## 2015-12-27 LAB — CBC WITH DIFFERENTIAL/PLATELET
BASOS: 0 %
Basophils Absolute: 0 10*3/uL (ref 0.0–0.2)
EOS (ABSOLUTE): 0.1 10*3/uL (ref 0.0–0.4)
EOS: 1 %
HEMATOCRIT: 41.7 % (ref 34.0–46.6)
Hemoglobin: 14.2 g/dL (ref 11.1–15.9)
IMMATURE GRANS (ABS): 0 10*3/uL (ref 0.0–0.1)
Immature Granulocytes: 0 %
Lymphocytes Absolute: 2.4 10*3/uL (ref 0.7–3.1)
Lymphs: 24 %
MCH: 31.5 pg (ref 26.6–33.0)
MCHC: 34.1 g/dL (ref 31.5–35.7)
MCV: 93 fL (ref 79–97)
MONOS ABS: 0.6 10*3/uL (ref 0.1–0.9)
Monocytes: 6 %
NEUTROS ABS: 6.9 10*3/uL (ref 1.4–7.0)
Neutrophils: 69 %
PLATELETS: 326 10*3/uL (ref 150–379)
RBC: 4.51 x10E6/uL (ref 3.77–5.28)
RDW: 13.4 % (ref 12.3–15.4)
WBC: 10 10*3/uL (ref 3.4–10.8)

## 2015-12-27 NOTE — Telephone Encounter (Signed)
This is the patient I was just speaking to you about.

## 2015-12-28 ENCOUNTER — Encounter: Payer: Self-pay | Admitting: Unknown Physician Specialty

## 2015-12-28 ENCOUNTER — Ambulatory Visit (INDEPENDENT_AMBULATORY_CARE_PROVIDER_SITE_OTHER): Payer: Managed Care, Other (non HMO) | Admitting: Unknown Physician Specialty

## 2015-12-28 VITALS — BP 142/87 | HR 106 | Temp 98.4°F | Ht 63.1 in | Wt 152.6 lb

## 2015-12-28 DIAGNOSIS — R938 Abnormal findings on diagnostic imaging of other specified body structures: Secondary | ICD-10-CM | POA: Diagnosis not present

## 2015-12-28 DIAGNOSIS — R9389 Abnormal findings on diagnostic imaging of other specified body structures: Secondary | ICD-10-CM

## 2015-12-28 NOTE — Progress Notes (Signed)
BP 142/87 mmHg  Pulse 106  Temp(Src) 98.4 F (36.9 C)  Ht 5' 3.1" (1.603 m)  Wt 152 lb 9.6 oz (69.219 kg)  BMI 26.94 kg/m2  SpO2 96%  LMP  (LMP Unknown)   Subjective:    Patient ID: Jill Gross, female    DOB: [birth year], 60 y.o.   MRN: [id number]  HPI: Jill Gross is a 60 y.o. female  Chief Complaint  Patient presents with  . Follow-up    pt states she is here for 2 day f/u on coughing up blood   Pt having no cough at this time besides in the AM.  She has quit smoking.  Never picked up the antibiotic called in for her due to left middle lobe density.  Will need a BMP for contrast.    Relevant past medical, surgical, family and social history reviewed and updated as indicated. Interim medical history since our last visit reviewed. Allergies and medications reviewed and updated.  Review of Systems  Per HPI unless specifically indicated above     Objective:    BP 142/87 mmHg  Pulse 106  Temp(Src) 98.4 F (36.9 C)  Ht 5' 3.1" (1.603 m)  Wt 152 lb 9.6 oz (69.219 kg)  BMI 26.94 kg/m2  SpO2 96%  LMP  (LMP Unknown)  Wt Readings from Last 3 Encounters:  12/28/15 152 lb 9.6 oz (69.219 kg)  12/26/15 153 lb 6.4 oz (69.582 kg)  09/25/15 159 lb 3.2 oz (72.213 kg)    Physical Exam  Constitutional: She is oriented to person, place, and time. She appears well-developed and well-nourished. No distress.  HENT:  Head: Normocephalic and atraumatic.  Eyes: Conjunctivae and lids are normal. Right eye exhibits no discharge. Left eye exhibits no discharge. No scleral icterus.  Cardiovascular: Normal rate.   Pulmonary/Chest: Effort normal.  Abdominal: Normal appearance. There is no splenomegaly or hepatomegaly.  Musculoskeletal: Normal range of motion.  Neurological: She is alert and oriented to person, place, and time.  Skin: Skin is intact. No rash noted. No pallor. + Psychiatric: She has a normal mood and affect. Her behavior is normal. Judgment and thought content normal.     Results for orders placed or performed in visit on 12/26/15  CBC with Differential/Platelet  Result Value Ref Range   WBC 10.0 3.4 - 10.8 x10E3/uL   RBC 4.51 3.77 - 5.28 x10E6/uL   Hemoglobin 14.2 11.1 - 15.9 g/dL   Hematocrit 41.7 34.0 - 46.6 %   MCV 93 79 - 97 fL   MCH 31.5 26.6 - 33.0 pg   MCHC 34.1 31.5 - 35.7 g/dL   RDW 13.4 12.3 - 15.4 %   Platelets 326 150 - 379 x10E3/uL   Neutrophils 69 %   Lymphs 24 %   Monocytes 6 %   Eos 1 %   Basos 0 %   Neutrophils Absolute 6.9 1.4 - 7.0 x10E3/uL   Lymphocytes Absolute 2.4 0.7 - 3.1 x10E3/uL   Monocytes Absolute 0.6 0.1 - 0.9 x10E3/uL   EOS (ABSOLUTE) 0.1 0.0 - 0.4 x10E3/uL   Basophils Absolute 0.0 0.0 - 0.2 x10E3/uL   Immature Granulocytes 0 %   Immature Grans (Abs) 0.0 0.0 - 0.1 x10E3/uL      Assessment & Plan:   Problem List Items Addressed This Visit    None    Visit Diagnoses    Abnormal chest x-ray    -  Primary    Piuck up antibiotic called in. Await results of  chest CT.  BMP for contrast dye administration    Relevant Orders    Basic Metabolic Panel (BMET)        Follow up plan: No Follow-up on file.

## 2015-12-29 LAB — BASIC METABOLIC PANEL
BUN/Creatinine Ratio: 7 — ABNORMAL LOW (ref 9–23)
BUN: 5 mg/dL — ABNORMAL LOW (ref 6–24)
CALCIUM: 9.4 mg/dL (ref 8.7–10.2)
CO2: 26 mmol/L (ref 18–29)
CREATININE: 0.76 mg/dL (ref 0.57–1.00)
Chloride: 98 mmol/L (ref 96–106)
GFR, EST AFRICAN AMERICAN: 99 mL/min/{1.73_m2} (ref >59)
GFR, EST NON AFRICAN AMERICAN: 86 mL/min/{1.73_m2} (ref >59)
Glucose: 102 mg/dL — ABNORMAL HIGH (ref 65–99)
POTASSIUM: 3.8 mmol/L (ref 3.5–5.2)
SODIUM: 143 mmol/L (ref 134–144)

## 2016-01-02 ENCOUNTER — Other Ambulatory Visit: Payer: Self-pay | Admitting: Unknown Physician Specialty

## 2016-01-02 ENCOUNTER — Ambulatory Visit
Admission: RE | Admit: 2016-01-02 | Discharge: 2016-01-02 | Disposition: A | Discharge status: Home / Self Care | Payer: Managed Care, Other (non HMO) | Source: Ambulatory Visit | Attending: Unknown Physician Specialty | Admitting: Unknown Physician Specialty

## 2016-01-02 DIAGNOSIS — R042 Hemoptysis: Secondary | ICD-10-CM | POA: Insufficient documentation

## 2016-01-02 DIAGNOSIS — I313 Pericardial effusion (noninflammatory): Secondary | ICD-10-CM | POA: Diagnosis not present

## 2016-01-02 DIAGNOSIS — R918 Other nonspecific abnormal finding of lung field: Secondary | ICD-10-CM | POA: Insufficient documentation

## 2016-01-02 MED ORDER — IOHEXOL 300 MG/ML  SOLN
75.0000 mL | Freq: Once | INTRAMUSCULAR | Status: AC | PRN
  Administered 2016-01-02: 75 mL via INTRAVENOUS

## 2016-01-03 ENCOUNTER — Inpatient Hospital Stay: Payer: Managed Care, Other (non HMO) | Attending: Oncology | Admitting: Oncology

## 2016-01-03 ENCOUNTER — Inpatient Hospital Stay: Payer: Managed Care, Other (non HMO)

## 2016-01-03 VITALS — BP 90/65 | HR 111 | Temp 96.9°F | Resp 20 | Wt 155.0 lb

## 2016-01-03 DIAGNOSIS — F17210 Nicotine dependence, cigarettes, uncomplicated: Secondary | ICD-10-CM | POA: Insufficient documentation

## 2016-01-03 DIAGNOSIS — E039 Hypothyroidism, unspecified: Secondary | ICD-10-CM | POA: Insufficient documentation

## 2016-01-03 DIAGNOSIS — J90 Pleural effusion, not elsewhere classified: Secondary | ICD-10-CM | POA: Diagnosis not present

## 2016-01-03 DIAGNOSIS — J449 Chronic obstructive pulmonary disease, unspecified: Secondary | ICD-10-CM | POA: Insufficient documentation

## 2016-01-03 DIAGNOSIS — F419 Anxiety disorder, unspecified: Secondary | ICD-10-CM | POA: Diagnosis not present

## 2016-01-03 DIAGNOSIS — M8580 Other specified disorders of bone density and structure, unspecified site: Secondary | ICD-10-CM | POA: Insufficient documentation

## 2016-01-03 DIAGNOSIS — I517 Cardiomegaly: Secondary | ICD-10-CM | POA: Insufficient documentation

## 2016-01-03 DIAGNOSIS — R918 Other nonspecific abnormal finding of lung field: Secondary | ICD-10-CM

## 2016-01-03 DIAGNOSIS — E785 Hyperlipidemia, unspecified: Secondary | ICD-10-CM | POA: Diagnosis not present

## 2016-01-03 DIAGNOSIS — I313 Pericardial effusion (noninflammatory): Secondary | ICD-10-CM | POA: Diagnosis not present

## 2016-01-03 DIAGNOSIS — G4700 Insomnia, unspecified: Secondary | ICD-10-CM | POA: Insufficient documentation

## 2016-01-03 DIAGNOSIS — F329 Major depressive disorder, single episode, unspecified: Secondary | ICD-10-CM | POA: Insufficient documentation

## 2016-01-03 DIAGNOSIS — R490 Dysphonia: Secondary | ICD-10-CM | POA: Insufficient documentation

## 2016-01-03 DIAGNOSIS — Z79899 Other long term (current) drug therapy: Secondary | ICD-10-CM | POA: Insufficient documentation

## 2016-01-03 DIAGNOSIS — N952 Postmenopausal atrophic vaginitis: Secondary | ICD-10-CM | POA: Insufficient documentation

## 2016-01-03 DIAGNOSIS — C3402 Malignant neoplasm of left main bronchus: Secondary | ICD-10-CM

## 2016-01-03 DIAGNOSIS — D510 Vitamin B12 deficiency anemia due to intrinsic factor deficiency: Secondary | ICD-10-CM | POA: Insufficient documentation

## 2016-01-03 DIAGNOSIS — Z7982 Long term (current) use of aspirin: Secondary | ICD-10-CM | POA: Diagnosis not present

## 2016-01-03 DIAGNOSIS — M545 Low back pain: Secondary | ICD-10-CM | POA: Diagnosis not present

## 2016-01-03 DIAGNOSIS — R05 Cough: Secondary | ICD-10-CM | POA: Diagnosis not present

## 2016-01-03 DIAGNOSIS — R5383 Other fatigue: Secondary | ICD-10-CM | POA: Diagnosis not present

## 2016-01-03 DIAGNOSIS — R042 Hemoptysis: Secondary | ICD-10-CM | POA: Insufficient documentation

## 2016-01-03 DIAGNOSIS — R590 Localized enlarged lymph nodes: Secondary | ICD-10-CM | POA: Diagnosis not present

## 2016-01-03 LAB — PROTIME-INR
INR: 1.04
PROTHROMBIN TIME: 13.8 s (ref 11.4–15.0)

## 2016-01-03 LAB — APTT: aPTT: 28 seconds (ref 24–36)

## 2016-01-03 NOTE — Progress Notes (Signed)
Patient here today for initial evaluation regarding lung mass. Patient report she has been coughing up blood and having chest pain for about 2 weeks, pain is rated 2/10 this morning.

## 2016-01-04 ENCOUNTER — Encounter
Admission: RE | Admit: 2016-01-04 | Discharge: 2016-01-04 | Disposition: A | Discharge status: Home / Self Care | Payer: Managed Care, Other (non HMO) | Source: Ambulatory Visit | Attending: Internal Medicine | Admitting: Internal Medicine

## 2016-01-04 ENCOUNTER — Inpatient Hospital Stay: Admission: RE | Admit: 2016-01-04 | Payer: Managed Care, Other (non HMO) | Source: Ambulatory Visit

## 2016-01-04 DIAGNOSIS — Z7982 Long term (current) use of aspirin: Secondary | ICD-10-CM | POA: Diagnosis not present

## 2016-01-04 DIAGNOSIS — R918 Other nonspecific abnormal finding of lung field: Secondary | ICD-10-CM | POA: Diagnosis present

## 2016-01-04 DIAGNOSIS — Z9049 Acquired absence of other specified parts of digestive tract: Secondary | ICD-10-CM | POA: Diagnosis not present

## 2016-01-04 DIAGNOSIS — N952 Postmenopausal atrophic vaginitis: Secondary | ICD-10-CM | POA: Diagnosis not present

## 2016-01-04 DIAGNOSIS — Z79899 Other long term (current) drug therapy: Secondary | ICD-10-CM | POA: Diagnosis not present

## 2016-01-04 DIAGNOSIS — G4700 Insomnia, unspecified: Secondary | ICD-10-CM | POA: Diagnosis not present

## 2016-01-04 DIAGNOSIS — Z888 Allergy status to other drugs, medicaments and biological substances status: Secondary | ICD-10-CM | POA: Diagnosis not present

## 2016-01-04 DIAGNOSIS — Z87891 Personal history of nicotine dependence: Secondary | ICD-10-CM | POA: Diagnosis not present

## 2016-01-04 DIAGNOSIS — R042 Hemoptysis: Secondary | ICD-10-CM | POA: Diagnosis not present

## 2016-01-04 DIAGNOSIS — M8580 Other specified disorders of bone density and structure, unspecified site: Secondary | ICD-10-CM | POA: Diagnosis not present

## 2016-01-04 DIAGNOSIS — C3432 Malignant neoplasm of lower lobe, left bronchus or lung: Secondary | ICD-10-CM | POA: Diagnosis not present

## 2016-01-04 DIAGNOSIS — F419 Anxiety disorder, unspecified: Secondary | ICD-10-CM | POA: Diagnosis not present

## 2016-01-04 DIAGNOSIS — F329 Major depressive disorder, single episode, unspecified: Secondary | ICD-10-CM | POA: Diagnosis not present

## 2016-01-04 DIAGNOSIS — R5383 Other fatigue: Secondary | ICD-10-CM | POA: Diagnosis not present

## 2016-01-04 DIAGNOSIS — Z825 Family history of asthma and other chronic lower respiratory diseases: Secondary | ICD-10-CM | POA: Diagnosis not present

## 2016-01-04 DIAGNOSIS — Z882 Allergy status to sulfonamides status: Secondary | ICD-10-CM | POA: Diagnosis not present

## 2016-01-04 DIAGNOSIS — Z7951 Long term (current) use of inhaled steroids: Secondary | ICD-10-CM | POA: Diagnosis not present

## 2016-01-04 DIAGNOSIS — E039 Hypothyroidism, unspecified: Secondary | ICD-10-CM | POA: Diagnosis not present

## 2016-01-04 DIAGNOSIS — D510 Vitamin B12 deficiency anemia due to intrinsic factor deficiency: Secondary | ICD-10-CM | POA: Diagnosis not present

## 2016-01-04 DIAGNOSIS — M545 Low back pain: Secondary | ICD-10-CM | POA: Diagnosis not present

## 2016-01-04 DIAGNOSIS — Z90710 Acquired absence of both cervix and uterus: Secondary | ICD-10-CM | POA: Diagnosis not present

## 2016-01-04 DIAGNOSIS — E785 Hyperlipidemia, unspecified: Secondary | ICD-10-CM | POA: Diagnosis not present

## 2016-01-04 DIAGNOSIS — J449 Chronic obstructive pulmonary disease, unspecified: Secondary | ICD-10-CM | POA: Diagnosis not present

## 2016-01-04 DIAGNOSIS — Z833 Family history of diabetes mellitus: Secondary | ICD-10-CM | POA: Diagnosis not present

## 2016-01-04 DIAGNOSIS — E538 Deficiency of other specified B group vitamins: Secondary | ICD-10-CM | POA: Diagnosis not present

## 2016-01-04 DIAGNOSIS — R079 Chest pain, unspecified: Secondary | ICD-10-CM | POA: Diagnosis not present

## 2016-01-04 HISTORY — DX: Anxiety disorder, unspecified: F41.9

## 2016-01-04 MED ORDER — ALPRAZOLAM 0.25 MG PO TABS: 0.2500 mg | ORAL_TABLET | Freq: Two times a day (BID) | ORAL | Status: DC | PRN

## 2016-01-04 NOTE — Progress Notes (Signed)
[place]  [phone]) [phone] [phone]) [phone]  ID: DASIAH HOOLEY OB: 12-16-1955  MR#: [id number]  KKX#:[id number]  Patient Care Team: [name], NP as PCP - General (Nurse Practitioner)  CHIEF COMPLAINT:  Chief Complaint  Patient presents with  . New Evaluation    Lung mass    INTERVAL HISTORY: Patient is a 60 year old female who presented to the emergency room with chest pain and hemoptysis for approximately 2 weeks. Subsequent workup included a CT scan which revealed a large left hilar mass suspicious for malignancy. Currently, she is highly anxious but otherwise feels well. She is no longer complaining of chest pain. She has no neurologic complaints. She has a good appetite and denies weight loss. She denies any shortness of breath or cough. She denies any nausea, vomiting, constipation, or diarrhea. She has no urinary complaints. Patient otherwise feels well and offers no further specific complaints.  REVIEW OF SYSTEMS:   Review of Systems  Constitutional: Negative for fever, weight loss and malaise/fatigue.  Respiratory: Positive for cough and hemoptysis. Negative for shortness of breath.   Cardiovascular: Negative.  Negative for chest pain.  Gastrointestinal: Negative.   Genitourinary: Negative.   Musculoskeletal: Negative.   Neurological: Negative.  Negative for weakness.    As per HPI. Otherwise, a complete review of systems is negatve.  PAST MEDICAL HISTORY: Past Medical History  Diagnosis Date  . Anemia     pernicious  . Hypothyroid   . Osteopenia   . Insomnia   . Allergy   . COPD (chronic obstructive pulmonary disease) ([place])   . Depression   . Atrophic vaginitis   . Low back pain   . Fatigue   . Lumbago   . Tobacco use   . Hyperlipidemia   . Vitamin B12 deficiency   . Anxiety     PAST SURGICAL HISTORY: Past Surgical History  Procedure Laterality Date  . Cholecystectomy    . Abdominal hysterectomy    . Hernia repair     . Ankle surgery      x4  . Carpal tunnel release  April 2015    FAMILY HISTORY Family History  Problem Relation Age of Onset  . Emphysema Mother   . Diabetes Father        ADVANCED DIRECTIVES:    HEALTH MAINTENANCE: Social History  Substance Use Topics  . Smoking status: Former Smoker -- 1.00 packs/day    Types: Cigarettes  . Smokeless tobacco: Never Used  . Alcohol Use: No     Colonoscopy:  PAP:  Bone density:  Lipid panel:  Allergies  Allergen Reactions  . Skelaxin [Metaxalone] Anaphylaxis  . Effexor [Venlafaxine]   . Hctz [Hydrochlorothiazide] Other (See Comments)    cramps  . Macrobid [Nitrofurantoin]   . Neurontin [Gabapentin]   . Paxil [Paroxetine Hcl]   . Pravachol [Pravastatin Sodium] Other (See Comments)    aching  . Prozac [Fluoxetine Hcl]   . Sulfur   . Wellbutrin [Bupropion]   . Zoloft [Sertraline Hcl]     Current Outpatient Prescriptions  Medication Sig Dispense Refill  . albuterol (PROAIR HFA) 108 (90 Base) MCG/ACT inhaler Inhale 2 puffs into the lungs every 4 (four) hours as needed for wheezing or shortness of breath. 8.5 g 1  . amitriptyline (ELAVIL) 10 MG tablet Take by mouth. Take 2 tablets at bedtime    . aspirin 81 MG tablet Take 81 mg by mouth daily.    . cyanocobalamin (,VITAMIN B-12,) 1000 MCG/ML injection Inject 1  mL (1,000 mcg total) into the muscle once. 10 mL 1  . escitalopram (LEXAPRO) 20 MG tablet Take 20 mg by mouth daily.    Marland Kitchen glucose blood test strip 1 each by Other route daily. Use as instructed    . levothyroxine (SYNTHROID, LEVOTHROID) 137 MCG tablet Take 137 mcg by mouth daily before breakfast.    . Multiple Vitamin (MULTIVITAMIN) tablet Take 1 tablet by mouth daily.    . simvastatin (ZOCOR) 10 MG tablet Take 10 mg by mouth daily.    Marland Kitchen zolpidem (AMBIEN) 10 MG tablet Take 1 tablet (10 mg total) by mouth at bedtime. 30 tablet 5  . ALPRAZolam (XANAX) 0.25 MG tablet Take 1 tablet (0.25 mg total) by mouth 2 (two) times daily  as needed for anxiety. 45 tablet 0   No current facility-administered medications for this visit.    OBJECTIVE: Filed Vitals:   01/03/16 0955  BP: 90/65  Pulse: 111  Temp: 96.9 F (36.1 C)  Resp: 20     Body mass index is 27.36 kg/(m^2).    ECOG FS:0 - Asymptomatic  General: Well-developed, well-nourished, no acute distress. Eyes: Pink conjunctiva, anicteric sclera. HEENT: Normocephalic, moist mucous membranes, clear oropharnyx. Lungs: Clear to auscultation bilaterally. Heart: Regular rate and rhythm. No rubs, murmurs, or gallops. Abdomen: Soft, nontender, nondistended. No organomegaly noted, normoactive bowel sounds. Musculoskeletal: No edema, cyanosis, or clubbing. Neuro: Alert, answering all questions appropriately. Cranial nerves grossly intact. Skin: No rashes or petechiae noted. Psych: Normal affect. Lymphatics: No cervical, calvicular, axillary or inguinal LAD.   LAB RESULTS:  Lab Results  Component Value Date   NA 143 12/28/2015   K 3.8 12/28/2015   CL 98 12/28/2015   CO2 26 12/28/2015   GLUCOSE 102* 12/28/2015   BUN 5* 12/28/2015   CREATININE 0.76 12/28/2015   CALCIUM 9.4 12/28/2015   PROT 6.9 09/10/2015   ALBUMIN 4.2 09/10/2015   AST 18 09/10/2015   ALT 15 09/10/2015   ALKPHOS 118* 09/10/2015   BILITOT 0.4 09/10/2015   GFRNONAA 86 12/28/2015   GFRAA 99 12/28/2015    Lab Results  Component Value Date   WBC 10.0 12/26/2015   NEUTROABS 6.9 12/26/2015   HCT 41.7 12/26/2015   MCV 93 12/26/2015   PLT 326 12/26/2015     STUDIES: Dg Chest 2 View  12/26/2015  CLINICAL DATA:  Hemoptysis. EXAM: CHEST  2 VIEW COMPARISON:  CT 10/05/2007. FINDINGS: Mediastinum and hilar structures normal. Cardiomegaly with normal pulmonary vascularity. Low lung volumes with mild basilar atelectasis. Increased density noted over the left upper anterior. This could represent a mediastinal, pleural, or pulmonary mass or infiltrate. Follow-up PA lateral chest x-ray suggested.  This density does not clear contrast-enhanced chest CT suggested for further evaluation. Left base subsegmental atelectasis. No pleural effusion or pneumothorax. No acute bony abnormality IMPRESSION: Increased density noted over the medial left anterior lung. This could represent a mediastinal, pleural, or pulmonary mass or infiltrate. Follow-up PA lateral chest x-ray is suggested to demonstrate clearing. If this density does not clear contrast-enhanced chest CT suggested. Electronically Signed   By: Marcello Moores  Register   On: 12/26/2015 16:20   Ct Chest W Contrast  01/02/2016  CLINICAL DATA:  Hoarseness for 5 week, intermittent hemoptysis EXAM: CT CHEST WITH CONTRAST TECHNIQUE: Multidetector CT imaging of the chest was performed during intravenous contrast administration. CONTRAST:  44m OMNIPAQUE IOHEXOL 300 MG/ML  SOLN COMPARISON:  Chest x-ray 12/26/2015 FINDINGS: Sagittal images of the spine shows mild degenerative changes thoracic spine.  The central airways are patent. Images of the thoracic inlet are unremarkable. There is sub- carinal pathologic lymph node measures at least 1.8 cm short-axis. There is left hilar mass or confluent adenopathy extending in left infrahilar region posteriorly. Best seen in coronal image 85 measures at least 3.5 x 4.7 cm. There is encasement of the left lower lobe medial segment bronchus. There is encasement of the segmental branch of pulmonary artery and narrowing of segmental pain. There is consolidation in left lower lobe posterior medially highly suspicious for tumor extension or postobstructive pneumonia. There is peripheral consolidation with nodular central low density. Necrotic tumor or focal alveolar infection cannot be excluded. Please see axial image 30. There is small left pleural effusion. Small pericardial effusion. Pathologic left hilar adenopathy. The visualized upper abdomen shows fatty infiltration of the liver. No adrenal gland mass is noted in visualized upper  abdomen. A calcified granuloma is noted in left lower lobe medially within consolidation. The right lung is clear. There is no right hilar adenopathy. There is pathologic lymph node in left AP window with mild mass effect on distal esophagus. Please correlate for dysphagia. See axial image 26. IMPRESSION: 1. There is mass or adenopathy in left hilum posteriorly extending in left infrahilar region. Measures at least 3.5 x 4.7 cm. This is highly suspicious for malignancy such as small cell carcinoma. Further correlation with bronchoscopy is recommended. There is encasement of the left lower lobe posterior medial segment bronchus and arterial branch. There is narrowing of segmental vein. There is consolidation with air bronchogram in left lower lobe posteromedially. Although may be due to postobstructive pneumonia tumor extension cannot be excluded. There is focal nodular consolidation with within left lower lobe posterior medial segment with low density centrally. Necrotic tumour or focal infectious alveolar process cannot be excluded. Please see axial image 31 measures 2.8 cm. There is sub- carinal and left hilar adenopathy. 2. The right lung is clear.  No right hilar adenopathy. 3. Small pericardial effusion. 4. No adrenal gland mass is noted. These results were called by telephone at the time of interpretation on 01/02/2016 at 10:25 am to Dr. [name] , who verbally acknowledged these results. Electronically Signed   By: Lahoma Crocker M.D.   On: 01/02/2016 10:29    ASSESSMENT: Clinical at least stage IIa, T2 N1 M0 presumed lung cancer.  PLAN:    1. Hilar mass: Given patient's extensive smoking history and hemoptysis this is highly suspicious for malignancy. Patient is scheduled for bronchoscopy next week to obtain biopsy and confirmed the diagnosis. We will get a PET scan as well as MRI of the brain to complete the staging workup. Patient will return to clinic in approximately 1 to 2 weeks to discuss her  pathology and imaging results and treatment planning.  Approximately 45 minutes was spent in discussion of which greater than 50% was consultation.  Patient expressed understanding and was in agreement with this plan. She also understands that She can call clinic at any time with any questions, concerns, or complaints.   Lloyd Huger, MD   01/04/2016 4:21 PM

## 2016-01-04 NOTE — Patient Instructions (Signed)
  Your procedure is scheduled on: Monday 01/07/16 AT 12:00 Report to Day Surgery. 2ND FLOOR MEDICAL MALL ENTRANCE To find out your arrival time please call [phone] between 1PM - 3PM on .  Remember: Instructions that are not followed completely may result in serious medical risk, up to and including death, or upon the discretion of your surgeon and anesthesiologist your surgery may need to be rescheduled.    __X__ 1. Do not eat food or drink liquids after midnight. No gum chewing or hard candies.     __X__ 2. No Alcohol for 24 hours before or after surgery.   ____ 3. Bring all medications with you on the day of surgery if instructed.    __X__ 4. Notify your doctor if there is any change in your medical condition     (cold, fever, infections).     Do not wear jewelry, make-up, hairpins, clips or nail polish.  Do not wear lotions, powders, or perfumes. You may wear deodorant.  Do not shave 48 hours prior to surgery. Men may shave face and neck.  Do not bring valuables to the hospital.    [facility] is not responsible for any belongings or valuables.               Contacts, dentures or bridgework may not be worn into surgery.  Leave your suitcase in the car. After surgery it may be brought to your room.  For patients admitted to the hospital, discharge time is determined by your                treatment team.   Patients discharged the day of surgery will not be allowed to drive home.   Please read over the following fact sheets that you were given:   Surgical Site Infection Prevention   __X__ Take these medicines the morning of surgery with A SIP OF WATER:    1. LEVOTHYROXINE/SYNTHROID  2. MAY TAKE ALPRAZOLAM FOR ANXIETY IF NEEDED  3.   4.  5.  6.  ____ Fleet Enema (as directed)   ____ Use CHG Soap as directed  __X__ Use inhalers on the day of surgery AND BRING WITH YOU  ____ Stop metformin 2 days prior to surgery    ____ Take 1/2 of usual insulin dose the night  before surgery and none on the morning of surgery.   ____ Stop Coumadin/Plavix/aspirin on   ____ Stop Anti-inflammatories on    ____ Stop supplements until after surgery.    ____ Bring C-Pap to the hospital.

## 2016-01-07 ENCOUNTER — Ambulatory Visit: Payer: Managed Care, Other (non HMO)

## 2016-01-07 ENCOUNTER — Ambulatory Visit
Admission: RE | Admit: 2016-01-07 | Discharge: 2016-01-07 | Disposition: A | Discharge status: Home / Self Care | Payer: Managed Care, Other (non HMO) | Source: Ambulatory Visit | Attending: Internal Medicine | Admitting: Internal Medicine

## 2016-01-07 ENCOUNTER — Ambulatory Visit: Payer: Managed Care, Other (non HMO) | Admitting: Anesthesiology

## 2016-01-07 ENCOUNTER — Encounter: Payer: Self-pay | Admitting: *Deleted

## 2016-01-07 ENCOUNTER — Encounter
Admission: RE | Disposition: A | Discharge status: Home / Self Care | Payer: Self-pay | Source: Ambulatory Visit | Attending: Internal Medicine

## 2016-01-07 ENCOUNTER — Telehealth: Payer: Self-pay | Admitting: *Deleted

## 2016-01-07 DIAGNOSIS — Z9049 Acquired absence of other specified parts of digestive tract: Secondary | ICD-10-CM | POA: Insufficient documentation

## 2016-01-07 DIAGNOSIS — E538 Deficiency of other specified B group vitamins: Secondary | ICD-10-CM | POA: Insufficient documentation

## 2016-01-07 DIAGNOSIS — Z90710 Acquired absence of both cervix and uterus: Secondary | ICD-10-CM | POA: Insufficient documentation

## 2016-01-07 DIAGNOSIS — J984 Other disorders of lung: Secondary | ICD-10-CM

## 2016-01-07 DIAGNOSIS — J449 Chronic obstructive pulmonary disease, unspecified: Secondary | ICD-10-CM | POA: Insufficient documentation

## 2016-01-07 DIAGNOSIS — G4700 Insomnia, unspecified: Secondary | ICD-10-CM | POA: Insufficient documentation

## 2016-01-07 DIAGNOSIS — E785 Hyperlipidemia, unspecified: Secondary | ICD-10-CM | POA: Insufficient documentation

## 2016-01-07 DIAGNOSIS — M8580 Other specified disorders of bone density and structure, unspecified site: Secondary | ICD-10-CM | POA: Insufficient documentation

## 2016-01-07 DIAGNOSIS — R222 Localized swelling, mass and lump, trunk: Secondary | ICD-10-CM

## 2016-01-07 DIAGNOSIS — R079 Chest pain, unspecified: Secondary | ICD-10-CM | POA: Insufficient documentation

## 2016-01-07 DIAGNOSIS — R918 Other nonspecific abnormal finding of lung field: Secondary | ICD-10-CM | POA: Insufficient documentation

## 2016-01-07 DIAGNOSIS — R5383 Other fatigue: Secondary | ICD-10-CM | POA: Insufficient documentation

## 2016-01-07 DIAGNOSIS — C3432 Malignant neoplasm of lower lobe, left bronchus or lung: Secondary | ICD-10-CM | POA: Insufficient documentation

## 2016-01-07 DIAGNOSIS — R042 Hemoptysis: Secondary | ICD-10-CM | POA: Insufficient documentation

## 2016-01-07 DIAGNOSIS — Z825 Family history of asthma and other chronic lower respiratory diseases: Secondary | ICD-10-CM | POA: Insufficient documentation

## 2016-01-07 DIAGNOSIS — Z833 Family history of diabetes mellitus: Secondary | ICD-10-CM | POA: Insufficient documentation

## 2016-01-07 DIAGNOSIS — N952 Postmenopausal atrophic vaginitis: Secondary | ICD-10-CM | POA: Insufficient documentation

## 2016-01-07 DIAGNOSIS — F329 Major depressive disorder, single episode, unspecified: Secondary | ICD-10-CM | POA: Insufficient documentation

## 2016-01-07 DIAGNOSIS — Z79899 Other long term (current) drug therapy: Secondary | ICD-10-CM | POA: Insufficient documentation

## 2016-01-07 DIAGNOSIS — Z87891 Personal history of nicotine dependence: Secondary | ICD-10-CM | POA: Insufficient documentation

## 2016-01-07 DIAGNOSIS — C3402 Malignant neoplasm of left main bronchus: Secondary | ICD-10-CM | POA: Insufficient documentation

## 2016-01-07 DIAGNOSIS — Z7951 Long term (current) use of inhaled steroids: Secondary | ICD-10-CM | POA: Insufficient documentation

## 2016-01-07 DIAGNOSIS — M545 Low back pain: Secondary | ICD-10-CM | POA: Insufficient documentation

## 2016-01-07 DIAGNOSIS — D510 Vitamin B12 deficiency anemia due to intrinsic factor deficiency: Secondary | ICD-10-CM | POA: Insufficient documentation

## 2016-01-07 DIAGNOSIS — F419 Anxiety disorder, unspecified: Secondary | ICD-10-CM | POA: Insufficient documentation

## 2016-01-07 DIAGNOSIS — Z882 Allergy status to sulfonamides status: Secondary | ICD-10-CM | POA: Insufficient documentation

## 2016-01-07 DIAGNOSIS — Z888 Allergy status to other drugs, medicaments and biological substances status: Secondary | ICD-10-CM | POA: Insufficient documentation

## 2016-01-07 DIAGNOSIS — Z7982 Long term (current) use of aspirin: Secondary | ICD-10-CM | POA: Insufficient documentation

## 2016-01-07 DIAGNOSIS — E039 Hypothyroidism, unspecified: Secondary | ICD-10-CM | POA: Insufficient documentation

## 2016-01-07 HISTORY — PX: ENDOBRONCHIAL ULTRASOUND: SHX5096

## 2016-01-07 LAB — GLUCOSE, CAPILLARY: Glucose-Capillary: 99 mg/dL (ref 65–99)

## 2016-01-07 SURGERY — ENDOBRONCHIAL ULTRASOUND (EBUS)
Anesthesia: General

## 2016-01-07 MED ORDER — FAMOTIDINE 20 MG PO TABS
20.0000 mg | ORAL_TABLET | Freq: Once | ORAL | Status: AC
  Administered 2016-01-07: 20 mg via ORAL

## 2016-01-07 MED ORDER — IPRATROPIUM-ALBUTEROL 0.5-2.5 (3) MG/3ML IN SOLN
RESPIRATORY_TRACT | Status: AC
  Filled 2016-01-07: qty 3

## 2016-01-07 MED ORDER — EPHEDRINE SULFATE 50 MG/ML IJ SOLN
INTRAMUSCULAR | Status: DC | PRN
  Administered 2016-01-07: 10 mg via INTRAVENOUS
  Administered 2016-01-07: 5 mg via INTRAVENOUS
  Administered 2016-01-07: 10 mg via INTRAVENOUS

## 2016-01-07 MED ORDER — FENTANYL CITRATE (PF) 100 MCG/2ML IJ SOLN
INTRAMUSCULAR | Status: DC | PRN
  Administered 2016-01-07: 50 ug via INTRAVENOUS
  Administered 2016-01-07: 200 ug via INTRAVENOUS

## 2016-01-07 MED ORDER — ROCURONIUM BROMIDE 100 MG/10ML IV SOLN
INTRAVENOUS | Status: DC | PRN
  Administered 2016-01-07: 40 mg via INTRAVENOUS

## 2016-01-07 MED ORDER — GLYCOPYRROLATE 0.2 MG/ML IJ SOLN
INTRAMUSCULAR | Status: DC | PRN
  Administered 2016-01-07: 0.4 mg via INTRAVENOUS

## 2016-01-07 MED ORDER — NEOSTIGMINE METHYLSULFATE 10 MG/10ML IV SOLN
INTRAVENOUS | Status: DC | PRN
  Administered 2016-01-07: 3 mg via INTRAVENOUS

## 2016-01-07 MED ORDER — IPRATROPIUM-ALBUTEROL 0.5-2.5 (3) MG/3ML IN SOLN: 3.0000 mL | Freq: Once | RESPIRATORY_TRACT | Status: DC | PRN

## 2016-01-07 MED ORDER — FENTANYL CITRATE (PF) 100 MCG/2ML IJ SOLN
25.0000 ug | INTRAMUSCULAR | Status: DC | PRN
  Administered 2016-01-07 (×4): 25 ug via INTRAVENOUS

## 2016-01-07 MED ORDER — PROPOFOL 10 MG/ML IV BOLUS
INTRAVENOUS | Status: DC | PRN
  Administered 2016-01-07: 150 mg via INTRAVENOUS

## 2016-01-07 MED ORDER — FENTANYL CITRATE (PF) 100 MCG/2ML IJ SOLN
INTRAMUSCULAR | Status: AC
  Administered 2016-01-07: 25 ug via INTRAVENOUS
  Filled 2016-01-07: qty 2

## 2016-01-07 MED ORDER — OXYCODONE HCL 5 MG PO TABS: 5.0000 mg | ORAL_TABLET | Freq: Once | ORAL | Status: DC | PRN

## 2016-01-07 MED ORDER — LIDOCAINE HCL (CARDIAC) 20 MG/ML IV SOLN
INTRAVENOUS | Status: DC | PRN
  Administered 2016-01-07: 40 mg via INTRAVENOUS
  Administered 2016-01-07: 60 mg via INTRAVENOUS

## 2016-01-07 MED ORDER — LIDOCAINE HCL (PF) 4 % IJ SOLN
INTRAMUSCULAR | Status: DC | PRN
  Administered 2016-01-07: 4 mL via RESPIRATORY_TRACT

## 2016-01-07 MED ORDER — OXYCODONE HCL 5 MG/5ML PO SOLN: 5.0000 mg | Freq: Once | ORAL | Status: DC | PRN

## 2016-01-07 MED ORDER — LACTATED RINGERS IV SOLN
INTRAVENOUS | Status: DC
  Administered 2016-01-07 (×2): via INTRAVENOUS

## 2016-01-07 MED ORDER — ONDANSETRON HCL 4 MG/2ML IJ SOLN
4.0000 mg | Freq: Once | INTRAMUSCULAR | Status: AC
  Administered 2016-01-07: 4 mg via INTRAVENOUS

## 2016-01-07 MED ORDER — IPRATROPIUM-ALBUTEROL 0.5-2.5 (3) MG/3ML IN SOLN
3.0000 mL | Freq: Once | RESPIRATORY_TRACT | Status: AC
  Administered 2016-01-07: 3 mL via RESPIRATORY_TRACT

## 2016-01-07 MED ORDER — MIDAZOLAM HCL 2 MG/2ML IJ SOLN
INTRAMUSCULAR | Status: DC | PRN
  Administered 2016-01-07: 2 mg via INTRAVENOUS

## 2016-01-07 MED ORDER — ONDANSETRON HCL 4 MG/2ML IJ SOLN
INTRAMUSCULAR | Status: AC
  Filled 2016-01-07: qty 2

## 2016-01-07 MED ORDER — FAMOTIDINE 20 MG PO TABS
ORAL_TABLET | ORAL | Status: AC
  Filled 2016-01-07: qty 1

## 2016-01-07 NOTE — Anesthesia Preprocedure Evaluation (Signed)
Anesthesia Evaluation  Patient identified by MRN, date of birth, ID band Patient awake    Reviewed: Allergy & Precautions, H&P , NPO status , Patient's Chart, lab work & pertinent test results  History of Anesthesia Complications Negative for: history of anesthetic complications  Airway Mallampati: III  TM Distance: >3 FB Neck ROM: limited    Dental  (+) Poor Dentition, Chipped, Missing, Upper Dentures, Lower Dentures   Pulmonary neg shortness of breath, COPD, former smoker,    Pulmonary exam normal breath sounds clear to auscultation       Cardiovascular Exercise Tolerance: Good hypertension, (-) angina(-) Past MI and (-) DOE Normal cardiovascular exam Rhythm:regular Rate:Normal     Neuro/Psych PSYCHIATRIC DISORDERS Anxiety Depression negative neurological ROS     GI/Hepatic negative GI ROS, Neg liver ROS,   Endo/Other  Hypothyroidism   Renal/GU negative Renal ROS  negative genitourinary   Musculoskeletal   Abdominal   Peds  Hematology negative hematology ROS (+)   Anesthesia Other Findings Past Medical History:   Anemia                                                         Comment:pernicious   Hypothyroid                                                  Osteopenia                                                   Insomnia                                                     Allergy                                                      COPD (chronic obstructive pulmonary disease) (*              Depression                                                   Atrophic vaginitis                                           Low back pain  Fatigue                                                      Lumbago                                                      Tobacco use                                                  Hyperlipidemia                                                Vitamin B12 deficiency                                       Anxiety                                                     Past Surgical History:   CHOLECYSTECTOMY                                               ABDOMINAL HYSTERECTOMY                                        HERNIA REPAIR                                                 ANKLE SURGERY                                                   Comment:x4   CARPAL TUNNEL RELEASE                            April 2015  BMI    Body Mass Index   26.42 kg/m 2      Reproductive/Obstetrics negative OB ROS                             Anesthesia Physical Anesthesia Plan  ASA: III  Anesthesia Plan: General ETT   Post-op Pain Management:  Induction:   Airway Management Planned:   Additional Equipment:   Intra-op Plan:   Post-operative Plan:   Informed Consent: I have reviewed the patients History and Physical, chart, labs and discussed the procedure including the risks, benefits and alternatives for the proposed anesthesia with the patient or authorized representative who has indicated his/her understanding and acceptance.   Dental Advisory Given  Plan Discussed with: Anesthesiologist, CRNA and Surgeon  Anesthesia Plan Comments:         Anesthesia Quick Evaluation

## 2016-01-07 NOTE — Anesthesia Procedure Notes (Signed)
Procedure Name: Intubation Date/Time: 01/07/2016 1:57 PM Performed by: Rosaria Ferries, Hendrick Pavich Pre-anesthesia Checklist: Patient identified, Emergency Drugs available, Suction available and Patient being monitored Patient Re-evaluated:Patient Re-evaluated prior to inductionOxygen Delivery Method: Circle system utilized Preoxygenation: Pre-oxygenation with 100% oxygen Intubation Type: IV induction Laryngoscope Size: Mac and 3 Grade View: Grade I Tube type: Oral Tube size: 8.5 mm Number of attempts: 1 Secured at: 21 cm Tube secured with: Tape Dental Injury: Teeth and Oropharynx as per pre-operative assessment

## 2016-01-07 NOTE — Telephone Encounter (Signed)
Scheduling is working on this.

## 2016-01-07 NOTE — Anesthesia Postprocedure Evaluation (Signed)
Anesthesia Post Note  Patient: Jill Gross  Procedure(s) Performed: Procedure(s) (LRB): ENDOBRONCHIAL ULTRASOUND (N/A)  Patient location during evaluation: PACU Anesthesia Type: General Level of consciousness: awake and alert Pain management: pain level controlled Vital Signs Assessment: post-procedure vital signs reviewed and stable Respiratory status: spontaneous breathing, nonlabored ventilation, respiratory function stable and patient connected to nasal cannula oxygen Cardiovascular status: blood pressure returned to baseline and stable Postop Assessment: no signs of nausea or vomiting Anesthetic complications: no    Last Vitals:  Filed Vitals:   01/07/16 1648 01/07/16 1704  BP: 160/78 138/79  Pulse: 88 84  Temp: 36.8 C   Resp: 17 17    Last Pain:  Filed Vitals:   01/07/16 1706  PainSc: 5                  Precious Haws Burton Paone

## 2016-01-07 NOTE — Telephone Encounter (Signed)
Insurance has denied PET we need to let pt know this

## 2016-01-07 NOTE — Discharge Instructions (Signed)
AMBULATORY SURGERY  °DISCHARGE INSTRUCTIONS ° ° °1) The drugs that you were given will stay in your system until tomorrow so for the next 24 hours you should not: ° °A) Drive an automobile °B) Make any legal decisions °C) Drink any alcoholic beverage ° ° °2) You may resume regular meals tomorrow.  Today it is better to start with liquids and gradually work up to solid foods. ° °You may eat anything you prefer, but it is better to start with liquids, then soup and crackers, and gradually work up to solid foods. ° ° °3) Please notify your doctor immediately if you have any unusual bleeding, trouble breathing, redness and pain at the surgery site, drainage, fever, or pain not relieved by medication. ° ° ° °4) Additional Instructions: ° ° ° ° ° ° ° °Please contact your physician with any problems or Same Day Surgery at [phone], Monday through Friday 6 am to 4 pm, or [place] [facility] [place] Main number at [phone]. °

## 2016-01-07 NOTE — Op Note (Signed)
[facility] Patient Name: Jill Gross Procedure Date: 01/07/2016 1:25 PM MRN: [id number] Account #: [id number] Date of Birth: 1956-05-28 Admit Type: Outpatient Age: 60 Room: [address] on 2nd floor Gender: Female Note Status: Finalized Attending MD: Vilinda Boehringer,  Procedure:         Bronchoscopy Indications:       Left lower lobe mass, Left hilar mass Providers:         Vilinda Boehringer, Annia Belt, Admin. Environmental education officer) Referring MD:       Medicines:         General Anesthesia Complications:     No immediate complications. Estimated blood loss: Minimal Procedure:         Pre-Anesthesia Assessment:                    - A History and Physical has been performed. The patient's                     medications, allergies and sensitivities have been                     reviewed.                    - The risks and benefits of the procedure and the sedation                     options and risks were discussed with the patient. All                     questions were answered and informed consent was obtained.                    - Pre-procedure physical examination revealed no                     contraindications to sedation.                    After obtaining informed consent, the bronchoscope was                     passed under direct vision. Throughout the procedure, the                     patient's blood pressure, pulse, and oxygen saturations                     were monitored continuously. the Bronchofibervideoscope                     Olympus [id number] was introduced through the                     nose, via the endotracheal tube (the patient was intubated                     for the procedure) and advanced to the trachea. The                     procedure was accomplished without difficulty. The patient                     tolerated the procedure well. Findings:      Left Lung Abnormalities: Extrinsic compression was found in the left       lower  lobe. The airway lumen is nearly occluded. The lesion was not       traversed. Edema was found in the left lower lobe. Erythema was found in       the left lower lobe.      The nasopharynx/oropharynx appears normal. The larynx appears normal.       The vocal cords are normal and move normally with phonation and       breathing. The subglottic space is normal. The trachea is of normal       caliber. The carina is sharp. The tracheobronchial tree of the right       lung was examined to at least the first subsegmental level. Bronchial       mucosa and anatomy in the right lung are normal; there are no       endobronchial lesions, and no secretions.      The endotracheal tube is in good position. The visualized portion of the       trachea is of normal caliber. The carina is sharp. The tracheobronchial       tree was examined to at least the first subsegmental level. Bronchial       mucosa and anatomy are normal; there are no endobronchial lesions, and       no secretions.      Endobronchial biopsies were performed in the left lower lobe using a       forceps and sent for cell count, bacterial culture, viral smears &       culture, analysis and cytology. Three samples were obtained.      An endobronchial ultrasound endoscope was utilized to systematically       examine in the left mainstem bronchus in order to assist with guiding       the biopsy needle.      Washings were obtained in the left lower lobe and sent for cell count,       bacterial culture, viral smears & culture, and fungal & AFB analysis and       cytology. The return was bloody. Multiple specimens were obtained, and       each sent for analysis.      Brushings were obtained in the left lower lobe and sent for cell count,       bacterial culture, viral smears & culture, fungal & AFB analysis and       cytology for immunocompromised host protocol. Four samples were obtained.      Lesion sizing was performed via endobronchial  ultrasound for presumed       lung cancer. Sampling by endobronchial needle aspiration was also       performed using a fine (21 gauge) needle in the left hilum and sent for       routine cytology.      - The lesion in the left hilum. Five samples with the needle were       obtained. Impression:        - Extrinsic compression was found in the left lower lobe                     secondary to a mass and secondary to a non-pulsatile mass.                    - Edema was present in the left lower lobe.                    -  Erythema was present in the left lower lobe.                    - The right lung was normal.                    - The examination was normal.                    - No specimens collected. Recommendation:    - Await BAL, biopsy, brushing, culture, cytology and                     washing results.                    - Consultation with/referral to referring MD. Vilinda Boehringer,  01/07/2016 3:31:21 PM Number of Addenda: 0 Note Initiated On: 01/07/2016 1:25 PM      [facility]

## 2016-01-07 NOTE — H&P (Signed)
See clinic note by Dr. [name] on 01/04/16. No change in H&P. Plan for EBUS of Left hilar mass.   Vilinda Boehringer, MD [place] Pager [phone] (please enter 7-digits) On Call Pager - [phone] (please enter 7-digits)

## 2016-01-07 NOTE — Telephone Encounter (Signed)
Per Dr Shari Heritage, she is to have a bx this week and does not want to cancel appt

## 2016-01-07 NOTE — Transfer of Care (Signed)
Immediate Anesthesia Transfer of Care Note  Patient: Jill Gross  Procedure(s) Performed: Procedure(s): ENDOBRONCHIAL ULTRASOUND (N/A)  Patient Location: PACU  Anesthesia Type:General  Level of Consciousness: awake and patient cooperative  Airway & Oxygen Therapy: Patient Spontanous Breathing and Patient connected to nasal cannula oxygen  Post-op Assessment: Report given to RN and Post -op Vital signs reviewed and stable  Post vital signs: Reviewed and stable  Last Vitals:  Filed Vitals:   01/07/16 1201  BP: 143/70  Pulse: 94  Temp: 36.6 C  Resp: 16    Complications: No apparent anesthesia complications

## 2016-01-08 ENCOUNTER — Encounter: Payer: Self-pay | Admitting: Internal Medicine

## 2016-01-09 ENCOUNTER — Ambulatory Visit: Admission: RE | Admit: 2016-01-09 | Payer: Managed Care, Other (non HMO) | Source: Ambulatory Visit

## 2016-01-09 DIAGNOSIS — C3492 Malignant neoplasm of unspecified part of left bronchus or lung: Secondary | ICD-10-CM

## 2016-01-09 HISTORY — DX: Malignant neoplasm of unspecified part of left bronchus or lung: C34.92

## 2016-01-10 LAB — CULTURE, BAL-QUANTITATIVE: CULTURE: NO GROWTH

## 2016-01-10 LAB — CULTURE, BAL-QUANTITATIVE W GRAM STAIN: Special Requests: NORMAL

## 2016-01-14 LAB — SURGICAL PATHOLOGY

## 2016-01-14 LAB — CYTOLOGY - NON PAP

## 2016-01-17 ENCOUNTER — Ambulatory Visit
Admission: RE | Admit: 2016-01-17 | Discharge: 2016-01-17 | Disposition: A | Discharge status: Home / Self Care | Payer: Managed Care, Other (non HMO) | Source: Ambulatory Visit | Attending: Oncology | Admitting: Oncology

## 2016-01-17 ENCOUNTER — Inpatient Hospital Stay: Payer: Managed Care, Other (non HMO) | Attending: Oncology | Admitting: Oncology

## 2016-01-17 ENCOUNTER — Inpatient Hospital Stay (HOSPITAL_BASED_OUTPATIENT_CLINIC_OR_DEPARTMENT_OTHER): Payer: Managed Care, Other (non HMO) | Admitting: Cardiothoracic Surgery

## 2016-01-17 ENCOUNTER — Encounter: Payer: Self-pay | Admitting: *Deleted

## 2016-01-17 VITALS — BP 134/87 | HR 102 | Temp 97.3°F | Resp 18 | Ht 64.0 in | Wt 156.1 lb

## 2016-01-17 DIAGNOSIS — R042 Hemoptysis: Secondary | ICD-10-CM

## 2016-01-17 DIAGNOSIS — M8580 Other specified disorders of bone density and structure, unspecified site: Secondary | ICD-10-CM | POA: Diagnosis not present

## 2016-01-17 DIAGNOSIS — J449 Chronic obstructive pulmonary disease, unspecified: Secondary | ICD-10-CM

## 2016-01-17 DIAGNOSIS — Z87891 Personal history of nicotine dependence: Secondary | ICD-10-CM | POA: Diagnosis not present

## 2016-01-17 DIAGNOSIS — F17210 Nicotine dependence, cigarettes, uncomplicated: Secondary | ICD-10-CM

## 2016-01-17 DIAGNOSIS — F419 Anxiety disorder, unspecified: Secondary | ICD-10-CM | POA: Diagnosis not present

## 2016-01-17 DIAGNOSIS — G4700 Insomnia, unspecified: Secondary | ICD-10-CM | POA: Diagnosis not present

## 2016-01-17 DIAGNOSIS — Z5111 Encounter for antineoplastic chemotherapy: Secondary | ICD-10-CM | POA: Insufficient documentation

## 2016-01-17 DIAGNOSIS — F329 Major depressive disorder, single episode, unspecified: Secondary | ICD-10-CM

## 2016-01-17 DIAGNOSIS — Z7982 Long term (current) use of aspirin: Secondary | ICD-10-CM | POA: Diagnosis not present

## 2016-01-17 DIAGNOSIS — I959 Hypotension, unspecified: Secondary | ICD-10-CM | POA: Diagnosis not present

## 2016-01-17 DIAGNOSIS — E785 Hyperlipidemia, unspecified: Secondary | ICD-10-CM

## 2016-01-17 DIAGNOSIS — M545 Low back pain: Secondary | ICD-10-CM

## 2016-01-17 DIAGNOSIS — R739 Hyperglycemia, unspecified: Secondary | ICD-10-CM | POA: Diagnosis not present

## 2016-01-17 DIAGNOSIS — C3432 Malignant neoplasm of lower lobe, left bronchus or lung: Secondary | ICD-10-CM

## 2016-01-17 DIAGNOSIS — E039 Hypothyroidism, unspecified: Secondary | ICD-10-CM | POA: Diagnosis not present

## 2016-01-17 DIAGNOSIS — I639 Cerebral infarction, unspecified: Secondary | ICD-10-CM | POA: Diagnosis not present

## 2016-01-17 DIAGNOSIS — C771 Secondary and unspecified malignant neoplasm of intrathoracic lymph nodes: Secondary | ICD-10-CM

## 2016-01-17 DIAGNOSIS — N952 Postmenopausal atrophic vaginitis: Secondary | ICD-10-CM

## 2016-01-17 DIAGNOSIS — C3402 Malignant neoplasm of left main bronchus: Secondary | ICD-10-CM

## 2016-01-17 DIAGNOSIS — G4730 Sleep apnea, unspecified: Secondary | ICD-10-CM | POA: Diagnosis not present

## 2016-01-17 DIAGNOSIS — Z79899 Other long term (current) drug therapy: Secondary | ICD-10-CM | POA: Diagnosis not present

## 2016-01-17 DIAGNOSIS — I517 Cardiomegaly: Secondary | ICD-10-CM | POA: Diagnosis not present

## 2016-01-17 MED ORDER — OXYCODONE-ACETAMINOPHEN 5-325 MG PO TABS: 1.0000 | ORAL_TABLET | ORAL | Status: DC | PRN

## 2016-01-17 NOTE — Progress Notes (Signed)
Patient ID: Jill Gross, female   DOB: [birth year], 60 y.o.   MRN: [id number]  Chief Complaint  Patient presents with  . Lung Mass    Port Placement    Referred By Dr. Delight Hoh Reason for Referral insertion of Port-A-Cath  HPI Location, Quality, Duration, Severity, Timing, Context, Modifying Factors, Associated Signs and Symptoms.  Jill Gross is a 60 y.o. female.  Jill Gross is a 60 year old female who began experiencing hemoptysis several weeks ago. She did undergo bronchoscopy by Dr. Stevenson Clinch which revealed small cell carcinoma the lung. Imaging studies show near complete collapse of the left lower lobe bronchus and some mediastinal adenopathy. Patient is otherwise in good health except for some surgery on her foot and some back pain. 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 She was stepped on her foot by a horse causing multiple operations on her foot. She does smoke but recently just quit. She has extensive allergies but can take penicillin.  She does not complain of any significant shortness of breath. She states that her hemoptysis is actually improved since her bronchoscopy. She has some back pain but this is been a chronic issue for her.   Past Medical History  Diagnosis Date  . Anemia     pernicious  . Hypothyroid   . Osteopenia   . Insomnia   . Allergy   . COPD (chronic obstructive pulmonary disease) ([place])   . Depression   . Atrophic vaginitis   . Low back pain   . Fatigue   . Lumbago   . Tobacco use   . Hyperlipidemia   . Vitamin B12 deficiency   . Anxiety     Past Surgical History  Procedure Laterality Date  . Cholecystectomy    . Abdominal hysterectomy    . Hernia repair    . Ankle surgery      x4  . Carpal tunnel release  April 2015  . Endobronchial ultrasound N/A 01/07/2016    Procedure: ENDOBRONCHIAL ULTRASOUND;  Surgeon: Vilinda Boehringer, MD;  Location: [facility] ORS;  Service: Cardiopulmonary;  Laterality: N/A;    Family History  Problem  Relation Age of Onset  . Emphysema Mother   . Diabetes Father     Social History Social History  Substance Use Topics  . Smoking status: Former Smoker -- 1.00 packs/day    Types: Cigarettes  . Smokeless tobacco: Never Used  . Alcohol Use: No    Allergies  Allergen Reactions  . Skelaxin [Metaxalone] Anaphylaxis  . Effexor [Venlafaxine]   . Hctz [Hydrochlorothiazide] Other (See Comments)    cramps  . Macrobid [Nitrofurantoin]   . Neurontin [Gabapentin]   . Paxil [Paroxetine Hcl]   . Pravachol [Pravastatin Sodium] Other (See Comments)    aching  . Prozac [Fluoxetine Hcl]   . Sulfur   . Wellbutrin [Bupropion]   . Zoloft [Sertraline Hcl]     Current Outpatient Prescriptions  Medication Sig Dispense Refill  . albuterol (PROAIR HFA) 108 (90 Base) MCG/ACT inhaler Inhale 2 puffs into the lungs every 4 (four) hours as needed for wheezing or shortness of breath. 8.5 g 1  . ALPRAZolam (XANAX) 0.25 MG tablet Take 1 tablet (0.25 mg total) by mouth 2 (two) times daily as needed for anxiety. 45 tablet 0  . amitriptyline (ELAVIL) 10 MG tablet Take by mouth. Take 2 tablets at bedtime    . aspirin 81 MG tablet Take 81 mg by mouth daily.    . cimetidine (  TAGAMET) 200 MG tablet Take 150 mg by mouth every morning.    . cyanocobalamin (,VITAMIN B-12,) 1000 MCG/ML injection Inject 1 mL (1,000 mcg total) into the muscle once. 10 mL 1  . escitalopram (LEXAPRO) 20 MG tablet Take 20 mg by mouth daily.    Marland Kitchen glucose blood test strip 1 each by Other route daily. Reported on 01/07/2016    . levothyroxine (SYNTHROID, LEVOTHROID) 137 MCG tablet Take 137 mcg by mouth daily before breakfast.    . Multiple Vitamin (MULTIVITAMIN) tablet Take 1 tablet by mouth daily.    . simvastatin (ZOCOR) 10 MG tablet Take 10 mg by mouth daily.    Marland Kitchen zolpidem (AMBIEN) 10 MG tablet Take 1 tablet (10 mg total) by mouth at bedtime. 30 tablet 5   No current facility-administered medications for this visit.      Review of  Systems A complete review of systems was asked and was negative except for the following positive findings difficulty with vision, cough, hemoptysis, shortness of breath, headache.  Blood pressure 134/87, pulse 102, temperature 97.3 F (36.3 C), temperature source Tympanic, resp. rate 18, height '5\' 4"'$  (1.626 m), weight 156 lb 1.4 oz (70.8 kg).  Physical Exam CONSTITUTIONAL:  Pleasant, well-developed, well-nourished, and in no acute distress. EYES: Pupils equal and reactive to light, Sclera non-icteric EARS, NOSE, MOUTH AND THROAT:  The oropharynx was clear.  She is edentulous.  Oral mucosa pink and moist. LYMPH NODES:  Lymph nodes in the neck and axillae were normal RESPIRATORY:  Lungs were clear.  Normal respiratory effort without pathologic use of accessory muscles of respiration CARDIOVASCULAR: Heart was regular without murmurs.  There were no carotid bruits. GI: The abdomen was soft, nontender, and nondistended. There were no palpable masses. There was no hepatosplenomegaly. There were normal bowel sounds in all quadrants. GU:  Rectal deferred.   MUSCULOSKELETAL:  Normal muscle strength and tone.  No clubbing or cyanosis.   SKIN:  There were no pathologic skin lesions.  There were no nodules on palpation. NEUROLOGIC:  Sensation is normal.  Cranial nerves are grossly intact. PSYCH:  Oriented to person, place and time.  Mood and affect are normal.  Data Reviewed CT scans  I have personally reviewed the patient's imaging, laboratory findings and medical records.    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 I do not believe she is a candidate for surgical resection. I did review with her the indications and risks of Port-A-Cath insertion including risks of bleeding, infection, pneumothorax, and catheter malfunction.    Plan    After receiving informed consent we'll go ahead and set her up for surgery next week. All her questions were answered.  I did discuss her care today with Dr. Edwin Dada and Dr. Delight Hoh.     [name], MD 01/17/2016, 3:52 PM

## 2016-01-18 ENCOUNTER — Telehealth: Payer: Self-pay | Admitting: Cardiothoracic Surgery

## 2016-01-18 NOTE — Progress Notes (Signed)
Met with patient and husband at follow up medical oncology appointment. Introduced Programmer, multimedia and reviewed plan of care. Had previously assisted patient with coordination. Will follow.

## 2016-01-18 NOTE — Telephone Encounter (Signed)
Pt advised of pre op date/time and sx date. Sx: 01/23/16 with Dr Rolley Sims Placement Pre op: 01/21/16 between 1-5:00pm--Phone.

## 2016-01-21 ENCOUNTER — Encounter: Payer: Self-pay | Admitting: *Deleted

## 2016-01-21 ENCOUNTER — Inpatient Hospital Stay: Admission: RE | Admit: 2016-01-21 | Payer: Managed Care, Other (non HMO) | Source: Ambulatory Visit

## 2016-01-21 ENCOUNTER — Other Ambulatory Visit: Payer: Managed Care, Other (non HMO)

## 2016-01-21 NOTE — Patient Instructions (Addendum)
  Your procedure is scheduled on: 01-23-16  Report to [facility]. To find out your arrival time please call [phone] between 1PM - 3PM on 01-22-16  Remember: Instructions that are not followed completely may result in serious medical risk, up to and including death, or upon the discretion of your surgeon and anesthesiologist your surgery may need to be rescheduled.    _X___ 1. Do not eat food or drink liquids after midnight. No gum chewing or hard candies.     _X___ 2. No Alcohol for 24 hours before or after surgery.   ____ 3. Bring all medications with you on the day of surgery if instructed.    ____ 4. Notify your doctor if there is any change in your medical condition     (cold, fever, infections).     Do not wear jewelry, make-up, hairpins, clips or nail polish.  Do not wear lotions, powders, or perfumes. You may wear deodorant.  Do not shave 48 hours prior to surgery. Men may shave face and neck.  Do not bring valuables to the hospital.    [facility] is not responsible for any belongings or valuables.               Contacts, dentures or bridgework may not be worn into surgery.  Leave your suitcase in the car. After surgery it may be brought to your room.  For patients admitted to the hospital, discharge time is determined by your treatment team.   Patients discharged the day of surgery will not be allowed to drive home.   Please read over the following fact sheets that you were given:      _X___ Take these medicines the morning of surgery with A SIP OF WATER:    1. LEVOTHYROXINE  2. MAY TAKE XANAX IF NEEDED DAY OF SURGERY WITH A SMALL SIP OF WATER  3.   4.  5.  6.  ____ Fleet Enema (as directed)   ____ Use CHG Soap as directed  _X___ Use inhalers on the day of surgery-USE ALBUTEROL INHALER AT [place]   ____ Stop metformin 2 days prior to surgery    ____ Take 1/2 of usual insulin dose the night before surgery and  none on the morning of surgery.   ____ Stop Coumadin/Plavix/aspirin-PT STOPPED ASA 3 WEEKS AGO  ____ Stop Anti-inflammatories-NO NSAIDS OR ASA PRODUCTS-PERCOCET OK TO CONTINUE    ____ Stop supplements until after surgery.    ____ Bring C-Pap to the hospital.

## 2016-01-22 ENCOUNTER — Encounter: Payer: Self-pay | Admitting: Radiation Oncology

## 2016-01-22 ENCOUNTER — Ambulatory Visit
Admission: RE | Admit: 2016-01-22 | Discharge: 2016-01-22 | Disposition: A | Discharge status: Home / Self Care | Payer: Managed Care, Other (non HMO) | Source: Ambulatory Visit | Attending: Oncology | Admitting: Oncology

## 2016-01-22 ENCOUNTER — Ambulatory Visit
Admission: RE | Admit: 2016-01-22 | Discharge: 2016-01-22 | Disposition: A | Discharge status: Home / Self Care | Payer: Managed Care, Other (non HMO) | Source: Ambulatory Visit | Attending: Radiation Oncology | Admitting: Radiation Oncology

## 2016-01-22 VITALS — BP 126/82 | HR 99 | Temp 97.1°F | Resp 18 | Wt 159.1 lb

## 2016-01-22 DIAGNOSIS — C3412 Malignant neoplasm of upper lobe, left bronchus or lung: Secondary | ICD-10-CM | POA: Insufficient documentation

## 2016-01-22 DIAGNOSIS — Z7982 Long term (current) use of aspirin: Secondary | ICD-10-CM | POA: Diagnosis not present

## 2016-01-22 DIAGNOSIS — C3432 Malignant neoplasm of lower lobe, left bronchus or lung: Secondary | ICD-10-CM | POA: Diagnosis not present

## 2016-01-22 DIAGNOSIS — D649 Anemia, unspecified: Secondary | ICD-10-CM | POA: Diagnosis not present

## 2016-01-22 DIAGNOSIS — R918 Other nonspecific abnormal finding of lung field: Secondary | ICD-10-CM

## 2016-01-22 DIAGNOSIS — M8580 Other specified disorders of bone density and structure, unspecified site: Secondary | ICD-10-CM | POA: Diagnosis not present

## 2016-01-22 DIAGNOSIS — Z87891 Personal history of nicotine dependence: Secondary | ICD-10-CM | POA: Diagnosis not present

## 2016-01-22 DIAGNOSIS — E039 Hypothyroidism, unspecified: Secondary | ICD-10-CM | POA: Diagnosis not present

## 2016-01-22 DIAGNOSIS — Z825 Family history of asthma and other chronic lower respiratory diseases: Secondary | ICD-10-CM | POA: Diagnosis not present

## 2016-01-22 DIAGNOSIS — J449 Chronic obstructive pulmonary disease, unspecified: Secondary | ICD-10-CM | POA: Insufficient documentation

## 2016-01-22 DIAGNOSIS — Z510 Encounter for antineoplastic radiation therapy: Secondary | ICD-10-CM | POA: Insufficient documentation

## 2016-01-22 DIAGNOSIS — C3492 Malignant neoplasm of unspecified part of left bronchus or lung: Secondary | ICD-10-CM | POA: Diagnosis present

## 2016-01-22 DIAGNOSIS — F17210 Nicotine dependence, cigarettes, uncomplicated: Secondary | ICD-10-CM | POA: Insufficient documentation

## 2016-01-22 DIAGNOSIS — M818 Other osteoporosis without current pathological fracture: Secondary | ICD-10-CM | POA: Insufficient documentation

## 2016-01-22 DIAGNOSIS — Z862 Personal history of diseases of the blood and blood-forming organs and certain disorders involving the immune mechanism: Secondary | ICD-10-CM | POA: Insufficient documentation

## 2016-01-22 DIAGNOSIS — M545 Low back pain: Secondary | ICD-10-CM | POA: Insufficient documentation

## 2016-01-22 DIAGNOSIS — E538 Deficiency of other specified B group vitamins: Secondary | ICD-10-CM | POA: Insufficient documentation

## 2016-01-22 DIAGNOSIS — E785 Hyperlipidemia, unspecified: Secondary | ICD-10-CM | POA: Diagnosis not present

## 2016-01-22 DIAGNOSIS — Z833 Family history of diabetes mellitus: Secondary | ICD-10-CM | POA: Diagnosis not present

## 2016-01-22 DIAGNOSIS — F419 Anxiety disorder, unspecified: Secondary | ICD-10-CM | POA: Diagnosis not present

## 2016-01-22 DIAGNOSIS — G4730 Sleep apnea, unspecified: Secondary | ICD-10-CM | POA: Insufficient documentation

## 2016-01-22 DIAGNOSIS — Z79899 Other long term (current) drug therapy: Secondary | ICD-10-CM | POA: Diagnosis not present

## 2016-01-22 DIAGNOSIS — C781 Secondary malignant neoplasm of mediastinum: Secondary | ICD-10-CM | POA: Diagnosis not present

## 2016-01-22 DIAGNOSIS — F329 Major depressive disorder, single episode, unspecified: Secondary | ICD-10-CM | POA: Diagnosis not present

## 2016-01-22 DIAGNOSIS — Z9049 Acquired absence of other specified parts of digestive tract: Secondary | ICD-10-CM | POA: Diagnosis not present

## 2016-01-22 DIAGNOSIS — Z882 Allergy status to sulfonamides status: Secondary | ICD-10-CM | POA: Diagnosis not present

## 2016-01-22 DIAGNOSIS — N952 Postmenopausal atrophic vaginitis: Secondary | ICD-10-CM | POA: Diagnosis not present

## 2016-01-22 DIAGNOSIS — Z7952 Long term (current) use of systemic steroids: Secondary | ICD-10-CM | POA: Diagnosis not present

## 2016-01-22 DIAGNOSIS — R5383 Other fatigue: Secondary | ICD-10-CM | POA: Diagnosis not present

## 2016-01-22 DIAGNOSIS — Z888 Allergy status to other drugs, medicaments and biological substances status: Secondary | ICD-10-CM | POA: Diagnosis not present

## 2016-01-22 DIAGNOSIS — G4700 Insomnia, unspecified: Secondary | ICD-10-CM | POA: Diagnosis not present

## 2016-01-22 DIAGNOSIS — Z881 Allergy status to other antibiotic agents status: Secondary | ICD-10-CM | POA: Diagnosis not present

## 2016-01-22 DIAGNOSIS — F418 Other specified anxiety disorders: Secondary | ICD-10-CM | POA: Insufficient documentation

## 2016-01-22 DIAGNOSIS — Z90710 Acquired absence of both cervix and uterus: Secondary | ICD-10-CM | POA: Diagnosis not present

## 2016-01-22 LAB — GLUCOSE, CAPILLARY: GLUCOSE-CAPILLARY: 92 mg/dL (ref 65–99)

## 2016-01-22 MED ORDER — PROCHLORPERAZINE MALEATE 10 MG PO TABS: 10.0000 mg | ORAL_TABLET | Freq: Four times a day (QID) | ORAL | Status: DC | PRN

## 2016-01-22 MED ORDER — FLUDEOXYGLUCOSE F - 18 (FDG) INJECTION
12.4900 | Freq: Once | INTRAVENOUS | Status: AC | PRN
  Administered 2016-01-22: 12.49 via INTRAVENOUS

## 2016-01-22 MED ORDER — GADOBENATE DIMEGLUMINE 529 MG/ML IV SOLN
15.0000 mL | Freq: Once | INTRAVENOUS | Status: AC | PRN
  Administered 2016-01-22: 15 mL via INTRAVENOUS

## 2016-01-22 MED ORDER — LIDOCAINE-PRILOCAINE 2.5-2.5 % EX CREA: TOPICAL_CREAM | CUTANEOUS | Status: DC

## 2016-01-22 MED ORDER — ONDANSETRON HCL 8 MG PO TABS: 8.0000 mg | ORAL_TABLET | Freq: Two times a day (BID) | ORAL | Status: DC | PRN

## 2016-01-22 NOTE — Consult Note (Signed)
Except an outstanding is perfect of Radiation Oncology NEW PATIENT EVALUATION  Name: Jill Gross  MRN: [id number]  Date:   01/22/2016     DOB: 1956/05/17   This 60 y.o. female patient presents to the clinic for initial evaluation of small cell lung cancer as yet not staged.  REFERRING PHYSICIAN: [name], NP  CHIEF COMPLAINT:  Chief Complaint  Patient presents with  . Lung Cancer    Pt is here for initial consultation of lung cancer.      DIAGNOSIS: The encounter diagnosis was Malignant neoplasm of lower lobe of left lung ([place]).   PREVIOUS INVESTIGATIONS:  CT scans reviewed PET CT scan and MRI of brain ordered Clinical notes reviewed Pathology report reviewed  HPI: Patient is a 60 year old female who began experiencing slight hemoptysis over the past several weeks. She also had a slight productive cough and feeling somewhat off. She was seen by pulmonology after CT scan and plain films demonstrated a 4.7 x 3.5 cm left infrahilar mass highly suspicious for malignancy. Bronchoscopy was performed showing extrinsic compression of the left lower lobe with air weight was completely obstructed by tumor. Biopsies were positive for small cell undifferentiated carcinoma. Patient is scheduled a PET CT scan for today as well as an MRI of her brain for complete staging workup. She is doing fairly well hemoptysis has improved somewhat. She states her appetite is good no dysphagia.  PLANNED TREATMENT REGIMEN: Probable chemotherapy with concurrent radiation therapy  PAST MEDICAL HISTORY:  has a past medical history of Anemia; Hypothyroid; Osteopenia; Insomnia; Allergy; COPD (chronic obstructive pulmonary disease) ([place]); Depression; Atrophic vaginitis; Low back pain; Fatigue; Lumbago; Tobacco use; Hyperlipidemia; Vitamin B12 deficiency; and Anxiety.    PAST SURGICAL HISTORY:  Past Surgical History  Procedure Laterality Date  . Cholecystectomy    . Abdominal hysterectomy    . Hernia repair     . Ankle surgery      x4  . Carpal tunnel release  April 2015  . Endobronchial ultrasound N/A 01/07/2016    Procedure: ENDOBRONCHIAL ULTRASOUND;  Surgeon: Vilinda Boehringer, MD;  Location: [facility] ORS;  Service: Cardiopulmonary;  Laterality: N/A;    FAMILY HISTORY: family history includes Diabetes in her father; Emphysema in her mother.  SOCIAL HISTORY:  reports that she has quit smoking. Her smoking use included Cigarettes. She smoked 1.00 pack per day. She has never used smokeless tobacco. She reports that she does not drink alcohol or use illicit drugs.  ALLERGIES: Skelaxin; Effexor; Hctz; Macrobid; Neurontin; Paxil; Pravachol; Prozac; Sulfur; Wellbutrin; and Zoloft  MEDICATIONS:  Current Outpatient Prescriptions  Medication Sig Dispense Refill  . albuterol (PROAIR HFA) 108 (90 Base) MCG/ACT inhaler Inhale 2 puffs into the lungs every 4 (four) hours as needed for wheezing or shortness of breath. 8.5 g 1  . ALPRAZolam (XANAX) 0.25 MG tablet Take 1 tablet (0.25 mg total) by mouth 2 (two) times daily as needed for anxiety. 45 tablet 0  . amitriptyline (ELAVIL) 10 MG tablet Take by mouth. Take 2 tablets at bedtime    . aspirin 81 MG tablet Take 81 mg by mouth daily.    . cimetidine (TAGAMET) 200 MG tablet Take 150 mg by mouth at bedtime.     . cyanocobalamin (,VITAMIN B-12,) 1000 MCG/ML injection Inject 1 mL (1,000 mcg total) into the muscle once. 10 mL 1  . escitalopram (LEXAPRO) 20 MG tablet Take 20 mg by mouth at bedtime.     Marland Kitchen glucose blood test strip 1 each by  Other route daily. Reported on 01/07/2016    . levothyroxine (SYNTHROID, LEVOTHROID) 137 MCG tablet Take 137 mcg by mouth daily before breakfast.    . Multiple Vitamin (MULTIVITAMIN) tablet Take 1 tablet by mouth daily.    Marland Kitchen oxyCODONE-acetaminophen (PERCOCET/ROXICET) 5-325 MG tablet Take 1 tablet by mouth every 4 (four) hours as needed for severe pain. 30 tablet 0  . simvastatin (ZOCOR) 10 MG tablet Take 10 mg by mouth daily at 6 PM.      . zolpidem (AMBIEN) 10 MG tablet Take 1 tablet (10 mg total) by mouth at bedtime. 30 tablet 5   No current facility-administered medications for this encounter.    ECOG PERFORMANCE STATUS:  1 - Symptomatic but completely ambulatory  REVIEW OF SYSTEMS: Except for the hemoptysis fatigue and slight weight loss  Patient denies any weight loss, fatigue, weakness, fever, chills or night sweats. Patient denies any loss of vision, blurred vision. Patient denies any ringing  of the ears or hearing loss. No irregular heartbeat. Patient denies heart murmur or history of fainting. Patient denies any chest pain or pain radiating to her upper extremities. Patient denies any shortness of breath, difficulty breathing at night, cough or hemoptysis. Patient denies any swelling in the lower legs. Patient denies any nausea vomiting, vomiting of blood, or coffee ground material in the vomitus. Patient denies any stomach pain. Patient states has had normal bowel movements no significant constipation or diarrhea. Patient denies any dysuria, hematuria or significant nocturia. Patient denies any problems walking, swelling in the joints or loss of balance. Patient denies any skin changes, loss of hair or loss of weight. Patient denies any excessive worrying or anxiety or significant depression. Patient denies any problems with insomnia. Patient denies excessive thirst, polyuria, polydipsia. Patient denies any swollen glands, patient denies easy bruising or easy bleeding. Patient denies any recent infections, allergies or URI. Patient "s visual fields have not changed significantly in recent time. PHYSICAL EXAM: BP 126/82 mmHg  Pulse 99  Temp(Src) 97.1 F (36.2 C)  Resp 18  Wt 159 lb 1 oz (72.15 kg)  LMP  (LMP Unknown) Well-developed well-nourished patient in NAD. HEENT reveals PERLA, EOMI, discs not visualized.  Oral cavity is clear. No oral mucosal lesions are identified. Neck is clear without evidence of cervical or  supraclavicular adenopathy. Lungs are clear to A&P. Cardiac examination is essentially unremarkable with regular rate and rhythm without murmur rub or thrill. Abdomen is benign with no organomegaly or masses noted. Motor sensory and DTR levels are equal and symmetric in the upper and lower extremities. Cranial nerves II through XII are grossly intact. Proprioception is intact. No peripheral adenopathy or edema is identified. No motor or sensory levels are noted. Crude visual fields are within normal range.  LABORATORY DATA: Bronchoscopy pathology reports reviewed    RADIOLOGY RESULTS: CT scan reviewed PET CT scan and MRI scan will be reviewed prior to initiating treatment   IMPRESSION: Small cell lung cancer as yet on staged in 60 year old female  PLAN: At this time I like you review her PET CT scan and MRI scan and determine if we are dealing with limited versus extensive stage small cell lung cancer. If this is limited stage disease would treat up to 6000 cGy over 6 weeks with concurrent chemotherapy. Would also recommend whole brain radiation to prevent microscopic relapse in her brain if she has no evidence of disease after completion of all chemotherapy and radiation. If she indeed has extensive stage disease also might  recommend a short course of palliative radiation to her chest to prevent further atelectasis of her left lower lobe and further obstruction of that lung. Also palliative radiation therapy to prevent further hemoptysis. Risks and benefits of radiation including possible dysphasia, fatigue, alteration of blood counts, skin reaction, all were described in detail to the patient and her husband. I have set her up and ordered CT simulation later this week after completion of her staging workup.  I would like to take this opportunity for allowing me to participate in the care of your patient.Armstead Peaks., MD

## 2016-01-22 NOTE — Patient Instructions (Signed)
Cisplatin injection What is this medicine? CISPLATIN (SIS pla tin) is a chemotherapy drug. It targets fast dividing cells, like cancer cells, and causes these cells to die. This medicine is used to treat many types of cancer like bladder, ovarian, and testicular cancers. This medicine may be used for other purposes; ask your health care provider or pharmacist if you have questions. What should I tell my health care provider before I take this medicine? They need to know if you have any of these conditions: -blood disorders -hearing problems -kidney disease -recent or ongoing radiation therapy -an unusual or allergic reaction to cisplatin, carboplatin, other chemotherapy, other medicines, foods, dyes, or preservatives -pregnant or trying to get pregnant -breast-feeding How should I use this medicine? This drug is given as an infusion into a vein. It is administered in a hospital or clinic by a specially trained health care professional. Talk to your pediatrician regarding the use of this medicine in children. Special care may be needed. Overdosage: If you think you have taken too much of this medicine contact a poison control center or emergency room at once. NOTE: This medicine is only for you. Do not share this medicine with others. What if I miss a dose? It is important not to miss a dose. Call your doctor or health care professional if you are unable to keep an appointment. What may interact with this medicine? -dofetilide -foscarnet -medicines for seizures -medicines to increase blood counts like filgrastim, pegfilgrastim, sargramostim -probenecid -pyridoxine used with altretamine -rituximab -some antibiotics like amikacin, gentamicin, neomycin, polymyxin B, streptomycin, tobramycin -sulfinpyrazone -vaccines -zalcitabine Talk to your doctor or health care professional before taking any of these medicines: -acetaminophen -aspirin -ibuprofen -ketoprofen -naproxen This list may  not describe all possible interactions. Give your health care provider a list of all the medicines, herbs, non-prescription drugs, or dietary supplements you use. Also tell them if you smoke, drink alcohol, or use illegal drugs. Some items may interact with your medicine. What should I watch for while using this medicine? Your condition will be monitored carefully while you are receiving this medicine. You will need important blood work done while you are taking this medicine. This drug may make you feel generally unwell. This is not uncommon, as chemotherapy can affect healthy cells as well as cancer cells. Report any side effects. Continue your course of treatment even though you feel ill unless your doctor tells you to stop. In some cases, you may be given additional medicines to help with side effects. Follow all directions for their use. Call your doctor or health care professional for advice if you get a fever, chills or sore throat, or other symptoms of a cold or flu. Do not treat yourself. This drug decreases your body's ability to fight infections. Try to avoid being around people who are sick. This medicine may increase your risk to bruise or bleed. Call your doctor or health care professional if you notice any unusual bleeding. Be careful brushing and flossing your teeth or using a toothpick because you may get an infection or bleed more easily. If you have any dental work done, tell your dentist you are receiving this medicine. Avoid taking products that contain aspirin, acetaminophen, ibuprofen, naproxen, or ketoprofen unless instructed by your doctor. These medicines may hide a fever. Do not become pregnant while taking this medicine. Women should inform their doctor if they wish to become pregnant or think they might be pregnant. There is a potential for serious side effects to   an unborn child. Talk to your health care professional or pharmacist for more information. Do not breast-feed an  infant while taking this medicine. Drink fluids as directed while you are taking this medicine. This will help protect your kidneys. Call your doctor or health care professional if you get diarrhea. Do not treat yourself. What side effects may I notice from receiving this medicine? Side effects that you should report to your doctor or health care professional as soon as possible: -allergic reactions like skin rash, itching or hives, swelling of the face, lips, or tongue -signs of infection - fever or chills, cough, sore throat, pain or difficulty passing urine -signs of decreased platelets or bleeding - bruising, pinpoint red spots on the skin, black, tarry stools, nosebleeds -signs of decreased red blood cells - unusually weak or tired, fainting spells, lightheadedness -breathing problems -changes in hearing -gout pain -low blood counts - This drug may decrease the number of white blood cells, red blood cells and platelets. You may be at increased risk for infections and bleeding. -nausea and vomiting -pain, swelling, redness or irritation at the injection site -pain, tingling, numbness in the hands or feet -problems with balance, movement -trouble passing urine or change in the amount of urine Side effects that usually do not require medical attention (report to your doctor or health care professional if they continue or are bothersome): -changes in vision -loss of appetite -metallic taste in the mouth or changes in taste This list may not describe all possible side effects. Call your doctor for medical advice about side effects. You may report side effects to FDA at 1-800-FDA-1088. Where should I keep my medicine? This drug is given in a hospital or clinic and will not be stored at home. NOTE: This sheet is a summary. It may not cover all possible information. If you have questions about this medicine, talk to your doctor, pharmacist, or health care provider.    2016, [employer]. (2008-02-29 14:40:54) Etoposide, VP-16 injection What is this medicine? ETOPOSIDE, VP-16 (e toe POE side) is a chemotherapy drug. It is used to treat testicular cancer, lung cancer, and other cancers. This medicine may be used for other purposes; ask your health care provider or pharmacist if you have questions. What should I tell my health care provider before I take this medicine? They need to know if you have any of these conditions: -infection -kidney disease -low blood counts, like low white cell, platelet, or red cell counts -an unusual or allergic reaction to etoposide, other chemotherapeutic agents, other medicines, foods, dyes, or preservatives -pregnant or trying to get pregnant -breast-feeding How should I use this medicine? This medicine is for infusion into a vein. It is administered in a hospital or clinic by a specially trained health care professional. Talk to your pediatrician regarding the use of this medicine in children. Special care may be needed. Overdosage: If you think you have taken too much of this medicine contact a poison control center or emergency room at once. NOTE: This medicine is only for you. Do not share this medicine with others. What if I miss a dose? It is important not to miss your dose. Call your doctor or health care professional if you are unable to keep an appointment. What may interact with this medicine? -aspirin -certain medications for seizures like carbamazepine, phenobarbital, phenytoin, valproic acid -cyclosporine -levamisole -warfarin This list may not describe all possible interactions. Give your health care provider a list of all the medicines, herbs,  non-prescription drugs, or dietary supplements you use. Also tell them if you smoke, drink alcohol, or use illegal drugs. Some items may interact with your medicine. What should I watch for while using this medicine? Visit your doctor for checks on your progress. This drug may  make you feel generally unwell. This is not uncommon, as chemotherapy can affect healthy cells as well as cancer cells. Report any side effects. Continue your course of treatment even though you feel ill unless your doctor tells you to stop. In some cases, you may be given additional medicines to help with side effects. Follow all directions for their use. Call your doctor or health care professional for advice if you get a fever, chills or sore throat, or other symptoms of a cold or flu. Do not treat yourself. This drug decreases your body's ability to fight infections. Try to avoid being around people who are sick. This medicine may increase your risk to bruise or bleed. Call your doctor or health care professional if you notice any unusual bleeding. Be careful brushing and flossing your teeth or using a toothpick because you may get an infection or bleed more easily. If you have any dental work done, tell your dentist you are receiving this medicine. Avoid taking products that contain aspirin, acetaminophen, ibuprofen, naproxen, or ketoprofen unless instructed by your doctor. These medicines may hide a fever. Do not become pregnant while taking this medicine or for at least 6 months after stopping it. Women should inform their doctor if they wish to become pregnant or think they might be pregnant. Women of child-bearing potential will need to have a negative pregnancy test before starting this medicine. There is a potential for serious side effects to an unborn child. Talk to your health care professional or pharmacist for more information. Do not breast-feed an infant while taking this medicine. Men must use a latex condom during sexual contact with a woman while taking this medicine and for at least 4 months after stopping it. A latex condom is needed even if you have had a vasectomy. Contact your doctor right away if your partner becomes pregnant. Do not donate sperm while taking this medicine and for  at least 4 months after you stop taking this medicine. Men should inform their doctors if they wish to father a child. This medicine may lower sperm counts. What side effects may I notice from receiving this medicine? Side effects that you should report to your doctor or health care professional as soon as possible: -allergic reactions like skin rash, itching or hives, swelling of the face, lips, or tongue -low blood counts - this medicine may decrease the number of white blood cells, red blood cells and platelets. You may be at increased risk for infections and bleeding. -signs of infection - fever or chills, cough, sore throat, pain or difficulty passing urine -signs of decreased platelets or bleeding - bruising, pinpoint red spots on the skin, black, tarry stools, blood in the urine -signs of decreased red blood cells - unusually weak or tired, fainting spells, lightheadedness -breathing problems -changes in vision -mouth or throat sores or ulcers -pain, redness, swelling or irritation at the injection site -pain, tingling, numbness in the hands or feet -redness, blistering, peeling or loosening of the skin, including inside the mouth -seizures -vomiting Side effects that usually do not require medical attention (report to your doctor or health care professional if they continue or are bothersome): -diarrhea -hair loss -loss of appetite -nausea -stomach pain  This list may not describe all possible side effects. Call your doctor for medical advice about side effects. You may report side effects to FDA at 1-800-FDA-1088. Where should I keep my medicine? This drug is given in a hospital or clinic and will not be stored at home. NOTE: This sheet is a summary. It may not cover all possible information. If you have questions about this medicine, talk to your doctor, pharmacist, or health care provider.    2016, [employer]. (2014-07-20 12:32:50)

## 2016-01-22 NOTE — Progress Notes (Signed)
[place]  [phone]) [phone] [phone]  ID: Jill Gross OB: March 09, 1956  MR#: [id number]  GYK#:[id number]  Patient Care Team: [name], NP as PCP - General (Nurse Practitioner)  CHIEF COMPLAINT: Small cell lung cancer. No chief complaint on file.   INTERVAL HISTORY: Patient returns to clinic today to discuss her biopsy results, further diagnostic as well as treatment planning. She continues to be highly anxious, but otherwise feels well. She is no longer complaining of chest pain. She has no neurologic complaints. She has a good appetite and denies weight loss. She denies any shortness of breath, hemoptysis, or cough. She denies any nausea, vomiting, constipation, or diarrhea. She has no urinary complaints. Patient otherwise feels well and offers no further specific complaints.  REVIEW OF SYSTEMS:   Review of Systems  Constitutional: Negative for fever, weight loss and malaise/fatigue.  Respiratory: Negative for cough, hemoptysis and shortness of breath.   Cardiovascular: Negative.  Negative for chest pain.  Gastrointestinal: Negative.   Genitourinary: Negative.   Musculoskeletal: Negative.   Neurological: Negative.  Negative for weakness.  Psychiatric/Behavioral: The patient is nervous/anxious.     As per HPI. Otherwise, a complete review of systems is negatve.  PAST MEDICAL HISTORY: Past Medical History  Diagnosis Date  . Anemia     pernicious  . Hypothyroid   . Osteopenia   . Insomnia   . Allergy   . COPD (chronic obstructive pulmonary disease) ([place])   . Depression   . Atrophic vaginitis   . Low back pain   . Fatigue   . Lumbago   . Tobacco use   . Hyperlipidemia   . Vitamin B12 deficiency   . Anxiety     PAST SURGICAL HISTORY: Past Surgical History  Procedure Laterality Date  . Cholecystectomy    . Abdominal hysterectomy    . Hernia repair    . Ankle surgery      x4  . Carpal tunnel release  April 2015  .  Endobronchial ultrasound N/A 01/07/2016    Procedure: ENDOBRONCHIAL ULTRASOUND;  Surgeon: Vilinda Boehringer, MD;  Location: [facility] ORS;  Service: Cardiopulmonary;  Laterality: N/A;    FAMILY HISTORY Family History  Problem Relation Age of Onset  . Emphysema Mother   . Diabetes Father        ADVANCED DIRECTIVES:    HEALTH MAINTENANCE: Social History  Substance Use Topics  . Smoking status: Former Smoker -- 1.00 packs/day    Types: Cigarettes  . Smokeless tobacco: Never Used  . Alcohol Use: No     Colonoscopy:  PAP:  Bone density:  Lipid panel:  Allergies  Allergen Reactions  . Skelaxin [Metaxalone] Anaphylaxis  . Effexor [Venlafaxine]   . Hctz [Hydrochlorothiazide] Other (See Comments)    cramps  . Macrobid [Nitrofurantoin]   . Neurontin [Gabapentin]   . Paxil [Paroxetine Hcl]   . Pravachol [Pravastatin Sodium] Other (See Comments)    aching  . Prozac [Fluoxetine Hcl]   . Sulfur   . Wellbutrin [Bupropion]   . Zoloft [Sertraline Hcl]     Current Outpatient Prescriptions  Medication Sig Dispense Refill  . albuterol (PROAIR HFA) 108 (90 Base) MCG/ACT inhaler Inhale 2 puffs into the lungs every 4 (four) hours as needed for wheezing or shortness of breath. 8.5 g 1  . ALPRAZolam (XANAX) 0.25 MG tablet Take 1 tablet (0.25 mg total) by mouth 2 (two) times daily as needed for anxiety. 45 tablet 0  . amitriptyline (ELAVIL) 10 MG tablet Take  by mouth. Take 2 tablets at bedtime    . aspirin 81 MG tablet Take 81 mg by mouth daily.    . cimetidine (TAGAMET) 200 MG tablet Take 150 mg by mouth at bedtime.     . cyanocobalamin (,VITAMIN B-12,) 1000 MCG/ML injection Inject 1 mL (1,000 mcg total) into the muscle once. 10 mL 1  . escitalopram (LEXAPRO) 20 MG tablet Take 20 mg by mouth at bedtime.     Marland Kitchen glucose blood test strip 1 each by Other route daily. Reported on 01/07/2016    . levothyroxine (SYNTHROID, LEVOTHROID) 137 MCG tablet Take 137 mcg by mouth daily before breakfast.    .  Multiple Vitamin (MULTIVITAMIN) tablet Take 1 tablet by mouth daily.    Marland Kitchen oxyCODONE-acetaminophen (PERCOCET/ROXICET) 5-325 MG tablet Take 1 tablet by mouth every 4 (four) hours as needed for severe pain. 30 tablet 0  . simvastatin (ZOCOR) 10 MG tablet Take 10 mg by mouth daily at 6 PM.     . zolpidem (AMBIEN) 10 MG tablet Take 1 tablet (10 mg total) by mouth at bedtime. 30 tablet 5   No current facility-administered medications for this visit.    OBJECTIVE: There were no vitals filed for this visit.   There is no weight on file to calculate BMI.    ECOG FS:0 - Asymptomatic  General: Well-developed, well-nourished, no acute distress. Eyes: Pink conjunctiva, anicteric sclera. Lungs: Clear to auscultation bilaterally. Heart: Regular rate and rhythm. No rubs, murmurs, or gallops. Abdomen: Soft, nontender, nondistended. No organomegaly noted, normoactive bowel sounds. Musculoskeletal: No edema, cyanosis, or clubbing. Neuro: Alert, answering all questions appropriately. Cranial nerves grossly intact. Skin: No rashes or petechiae noted. Psych: Normal affect.   LAB RESULTS:  Lab Results  Component Value Date   NA 143 12/28/2015   K 3.8 12/28/2015   CL 98 12/28/2015   CO2 26 12/28/2015   GLUCOSE 102* 12/28/2015   BUN 5* 12/28/2015   CREATININE 0.76 12/28/2015   CALCIUM 9.4 12/28/2015   PROT 6.9 09/10/2015   ALBUMIN 4.2 09/10/2015   AST 18 09/10/2015   ALT 15 09/10/2015   ALKPHOS 118* 09/10/2015   BILITOT 0.4 09/10/2015   GFRNONAA 86 12/28/2015   GFRAA 99 12/28/2015    Lab Results  Component Value Date   WBC 10.0 12/26/2015   NEUTROABS 6.9 12/26/2015   HCT 41.7 12/26/2015   MCV 93 12/26/2015   PLT 326 12/26/2015     STUDIES: Dg Chest 2 View  12/26/2015  CLINICAL DATA:  Hemoptysis. EXAM: CHEST  2 VIEW COMPARISON:  CT 10/05/2007. FINDINGS: Mediastinum and hilar structures normal. Cardiomegaly with normal pulmonary vascularity. Low lung volumes with mild basilar  atelectasis. Increased density noted over the left upper anterior. This could represent a mediastinal, pleural, or pulmonary mass or infiltrate. Follow-up PA lateral chest x-ray suggested. This density does not clear contrast-enhanced chest CT suggested for further evaluation. Left base subsegmental atelectasis. No pleural effusion or pneumothorax. No acute bony abnormality IMPRESSION: Increased density noted over the medial left anterior lung. This could represent a mediastinal, pleural, or pulmonary mass or infiltrate. Follow-up PA lateral chest x-ray is suggested to demonstrate clearing. If this density does not clear contrast-enhanced chest CT suggested. Electronically Signed   By: Marcello Moores  Register   On: 12/26/2015 16:20   Ct Chest W Contrast  01/02/2016  CLINICAL DATA:  Hoarseness for 5 week, intermittent hemoptysis EXAM: CT CHEST WITH CONTRAST TECHNIQUE: Multidetector CT imaging of the chest was performed during intravenous contrast  administration. CONTRAST:  71m OMNIPAQUE IOHEXOL 300 MG/ML  SOLN COMPARISON:  Chest x-ray 12/26/2015 FINDINGS: Sagittal images of the spine shows mild degenerative changes thoracic spine. The central airways are patent. Images of the thoracic inlet are unremarkable. There is sub- carinal pathologic lymph node measures at least 1.8 cm short-axis. There is left hilar mass or confluent adenopathy extending in left infrahilar region posteriorly. Best seen in coronal image 85 measures at least 3.5 x 4.7 cm. There is encasement of the left lower lobe medial segment bronchus. There is encasement of the segmental branch of pulmonary artery and narrowing of segmental pain. There is consolidation in left lower lobe posterior medially highly suspicious for tumor extension or postobstructive pneumonia. There is peripheral consolidation with nodular central low density. Necrotic tumor or focal alveolar infection cannot be excluded. Please see axial image 30. There is small left pleural  effusion. Small pericardial effusion. Pathologic left hilar adenopathy. The visualized upper abdomen shows fatty infiltration of the liver. No adrenal gland mass is noted in visualized upper abdomen. A calcified granuloma is noted in left lower lobe medially within consolidation. The right lung is clear. There is no right hilar adenopathy. There is pathologic lymph node in left AP window with mild mass effect on distal esophagus. Please correlate for dysphagia. See axial image 26. IMPRESSION: 1. There is mass or adenopathy in left hilum posteriorly extending in left infrahilar region. Measures at least 3.5 x 4.7 cm. This is highly suspicious for malignancy such as small cell carcinoma. Further correlation with bronchoscopy is recommended. There is encasement of the left lower lobe posterior medial segment bronchus and arterial branch. There is narrowing of segmental vein. There is consolidation with air bronchogram in left lower lobe posteromedially. Although may be due to postobstructive pneumonia tumor extension cannot be excluded. There is focal nodular consolidation with within left lower lobe posterior medial segment with low density centrally. Necrotic tumour or focal infectious alveolar process cannot be excluded. Please see axial image 31 measures 2.8 cm. There is sub- carinal and left hilar adenopathy. 2. The right lung is clear.  No right hilar adenopathy. 3. Small pericardial effusion. 4. No adrenal gland mass is noted. These results were called by telephone at the time of interpretation on 01/02/2016 at 10:25 am to Dr. CKathrine Haddock, who verbally acknowledged these results. Electronically Signed   By: LLahoma CrockerM.D.   On: 01/02/2016 10:29   Mr BJeri CosWHFContrast  01/22/2016  CLINICAL DATA:  Recently diagnosed left lung cancer.  Staging. EXAM: MRI HEAD WITHOUT AND WITH CONTRAST TECHNIQUE: Multiplanar, multiecho pulse sequences of the brain and surrounding structures were obtained without and with  intravenous contrast. CONTRAST:  169mMULTIHANCE GADOBENATE DIMEGLUMINE 529 MG/ML IV SOLN COMPARISON:  None. FINDINGS: There is no evidence of acute infarct, intracranial hemorrhage, mass, midline shift, or extra-axial fluid collection. Ventricles and sulci are normal. Patchy T2 hyperintensities are present in the subcortical and deep cerebral white matter bilaterally, nonspecific but compatible with moderate chronic small vessel ischemic disease. 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 No abnormal enhancement is identified. Orbits are unremarkable. Mild left maxillary sinus mucosal thickening and a small right mastoid effusion are noted. Major intracranial vascular flow voids are preserved. No suspicious skull lesions are identified. IMPRESSION: 1. No evidence of intracranial metastases. 2. Moderate chronic small vessel ischemic disease. Electronically Signed   By: AlLogan Bores.D.   On: 01/22/2016 15:24   Nm Pet Image Initial (pi)  Skull Base To Thigh  01/22/2016  CLINICAL DATA:  Initial treatment strategy for lung mass. Bronchoscopy 01/07/2016. EXAM: NUCLEAR MEDICINE PET SKULL BASE TO THIGH TECHNIQUE: 12.5 mCi F-18 FDG was injected intravenously. Full-ring PET imaging was performed from the skull base to thigh after the radiotracer. CT data was obtained and used for attenuation correction and anatomic localization. FASTING BLOOD GLUCOSE:  Value: 98 mg/dl COMPARISON:  None. FINDINGS: NECK No hypermetabolic lymph nodes in the neck. CHEST LEFT infrahilar mass constricting the LEFT lower lobe bronchus measures approximately 4.3 cm with intense metabolic activity (SUV max equal 13.7). Second hypermetabolic focus within the collapsed LEFT lower lobe which corresponds to a small hypodensity on the CT portion exam, measures approximately 3.4 cm and has intense metabolic activity with SUV max equal 69.7 Hypermetabolic subcarinal lymph node with SUV max equal 94.8 No hypermetabolic supraclavicular  nodes. No additional pulmonary nodules. ABDOMEN/PELVIS No abnormal hypermetabolic activity within the liver, pancreas, adrenal glands, or spleen. No hypermetabolic lymph nodes in the abdomen or pelvis. SKELETON No focal hypermetabolic activity to suggest skeletal metastasis. IMPRESSION: 1. Hypermetabolic LEFT infrahilar mass consistent bronchogenic carcinoma versus metastatic adenopathy. 2. Hypermetabolic mass within the collapsed LEFT lower lobe may represent primary malignancy. 3. 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 Electronically Signed   By: Suzy Bouchard M.D.   On: 01/22/2016 14:35    ASSESSMENT: Clinical at least stage IIa, T2 N1 M0 small cell lung cancer.  PLAN:    1. Small cell lung cancer: PET scan, MRI, and CT scans all reviewed independently and reported as above. Biopsy consistent with small cell carcinoma of the lung. Will plan on concurrent chemotherapy and XRT and patient has a consult with radiation oncology later this week. She will require port placement prior to initiating treatment. Patient will then return to clinic on January 28, 2016 to initiate cycle 1 of 4 of cisplatin and etoposide. She will receive cisplatin and etoposide on day 1 with etoposide only on days 2 and 3. She will be given treatment every 3 weeks and plan to reimage after cycle 4.  Approximately 30 minutes was spent in discussion of which greater than 50% was consultation.  Patient expressed understanding and was in agreement with this plan. She also understands that She can call clinic at any time with any questions, concerns, or complaints.   Lloyd Huger, MD   01/22/2016 3:41 PM

## 2016-01-23 ENCOUNTER — Institutional Professional Consult (permissible substitution): Payer: Managed Care, Other (non HMO) | Admitting: Radiation Oncology

## 2016-01-23 ENCOUNTER — Ambulatory Visit: Payer: Managed Care, Other (non HMO)

## 2016-01-23 ENCOUNTER — Ambulatory Visit
Admission: RE | Admit: 2016-01-23 | Discharge: 2016-01-23 | Disposition: A | Discharge status: Home / Self Care | Payer: Managed Care, Other (non HMO) | Source: Ambulatory Visit | Attending: Cardiothoracic Surgery | Admitting: Cardiothoracic Surgery

## 2016-01-23 ENCOUNTER — Encounter
Admission: RE | Disposition: A | Discharge status: Home / Self Care | Payer: Self-pay | Source: Ambulatory Visit | Attending: Cardiothoracic Surgery

## 2016-01-23 ENCOUNTER — Ambulatory Visit: Payer: Managed Care, Other (non HMO) | Admitting: Anesthesiology

## 2016-01-23 DIAGNOSIS — E039 Hypothyroidism, unspecified: Secondary | ICD-10-CM | POA: Insufficient documentation

## 2016-01-23 DIAGNOSIS — E538 Deficiency of other specified B group vitamins: Secondary | ICD-10-CM | POA: Diagnosis not present

## 2016-01-23 DIAGNOSIS — C3412 Malignant neoplasm of upper lobe, left bronchus or lung: Secondary | ICD-10-CM | POA: Diagnosis present

## 2016-01-23 DIAGNOSIS — Z9049 Acquired absence of other specified parts of digestive tract: Secondary | ICD-10-CM | POA: Insufficient documentation

## 2016-01-23 DIAGNOSIS — F17210 Nicotine dependence, cigarettes, uncomplicated: Secondary | ICD-10-CM | POA: Diagnosis not present

## 2016-01-23 DIAGNOSIS — E785 Hyperlipidemia, unspecified: Secondary | ICD-10-CM | POA: Insufficient documentation

## 2016-01-23 DIAGNOSIS — F419 Anxiety disorder, unspecified: Secondary | ICD-10-CM | POA: Insufficient documentation

## 2016-01-23 DIAGNOSIS — G4700 Insomnia, unspecified: Secondary | ICD-10-CM | POA: Insufficient documentation

## 2016-01-23 DIAGNOSIS — R5383 Other fatigue: Secondary | ICD-10-CM | POA: Insufficient documentation

## 2016-01-23 DIAGNOSIS — C781 Secondary malignant neoplasm of mediastinum: Secondary | ICD-10-CM | POA: Insufficient documentation

## 2016-01-23 DIAGNOSIS — Z87891 Personal history of nicotine dependence: Secondary | ICD-10-CM | POA: Insufficient documentation

## 2016-01-23 DIAGNOSIS — D649 Anemia, unspecified: Secondary | ICD-10-CM | POA: Insufficient documentation

## 2016-01-23 DIAGNOSIS — M545 Low back pain: Secondary | ICD-10-CM | POA: Insufficient documentation

## 2016-01-23 DIAGNOSIS — Z90710 Acquired absence of both cervix and uterus: Secondary | ICD-10-CM | POA: Insufficient documentation

## 2016-01-23 DIAGNOSIS — Z510 Encounter for antineoplastic radiation therapy: Secondary | ICD-10-CM | POA: Diagnosis not present

## 2016-01-23 DIAGNOSIS — Z79899 Other long term (current) drug therapy: Secondary | ICD-10-CM | POA: Insufficient documentation

## 2016-01-23 DIAGNOSIS — C3402 Malignant neoplasm of left main bronchus: Secondary | ICD-10-CM | POA: Diagnosis not present

## 2016-01-23 DIAGNOSIS — C3432 Malignant neoplasm of lower lobe, left bronchus or lung: Secondary | ICD-10-CM

## 2016-01-23 DIAGNOSIS — Z7952 Long term (current) use of systemic steroids: Secondary | ICD-10-CM | POA: Insufficient documentation

## 2016-01-23 DIAGNOSIS — M8580 Other specified disorders of bone density and structure, unspecified site: Secondary | ICD-10-CM | POA: Insufficient documentation

## 2016-01-23 DIAGNOSIS — J449 Chronic obstructive pulmonary disease, unspecified: Secondary | ICD-10-CM | POA: Diagnosis not present

## 2016-01-23 DIAGNOSIS — N952 Postmenopausal atrophic vaginitis: Secondary | ICD-10-CM | POA: Insufficient documentation

## 2016-01-23 DIAGNOSIS — F329 Major depressive disorder, single episode, unspecified: Secondary | ICD-10-CM | POA: Insufficient documentation

## 2016-01-23 DIAGNOSIS — Z7982 Long term (current) use of aspirin: Secondary | ICD-10-CM | POA: Insufficient documentation

## 2016-01-23 DIAGNOSIS — G4730 Sleep apnea, unspecified: Secondary | ICD-10-CM | POA: Diagnosis not present

## 2016-01-23 DIAGNOSIS — F418 Other specified anxiety disorders: Secondary | ICD-10-CM | POA: Diagnosis not present

## 2016-01-23 DIAGNOSIS — Z825 Family history of asthma and other chronic lower respiratory diseases: Secondary | ICD-10-CM | POA: Insufficient documentation

## 2016-01-23 DIAGNOSIS — Z833 Family history of diabetes mellitus: Secondary | ICD-10-CM | POA: Insufficient documentation

## 2016-01-23 DIAGNOSIS — Z888 Allergy status to other drugs, medicaments and biological substances status: Secondary | ICD-10-CM | POA: Insufficient documentation

## 2016-01-23 DIAGNOSIS — Z882 Allergy status to sulfonamides status: Secondary | ICD-10-CM | POA: Insufficient documentation

## 2016-01-23 DIAGNOSIS — Z09 Encounter for follow-up examination after completed treatment for conditions other than malignant neoplasm: Secondary | ICD-10-CM

## 2016-01-23 DIAGNOSIS — Z862 Personal history of diseases of the blood and blood-forming organs and certain disorders involving the immune mechanism: Secondary | ICD-10-CM | POA: Diagnosis not present

## 2016-01-23 DIAGNOSIS — Z881 Allergy status to other antibiotic agents status: Secondary | ICD-10-CM | POA: Insufficient documentation

## 2016-01-23 DIAGNOSIS — M818 Other osteoporosis without current pathological fracture: Secondary | ICD-10-CM | POA: Diagnosis not present

## 2016-01-23 HISTORY — PX: PORTACATH PLACEMENT: SHX2246

## 2016-01-23 SURGERY — INSERTION, TUNNELED CENTRAL VENOUS DEVICE, WITH PORT
Anesthesia: General | Site: Chest | Laterality: Right | Wound class: Clean

## 2016-01-23 MED ORDER — LIDOCAINE HCL 1 % IJ SOLN
INTRAMUSCULAR | Status: DC | PRN
  Administered 2016-01-23: 9 mL

## 2016-01-23 MED ORDER — FAMOTIDINE 20 MG PO TABS
20.0000 mg | ORAL_TABLET | Freq: Once | ORAL | Status: AC
  Administered 2016-01-23: 20 mg via ORAL

## 2016-01-23 MED ORDER — LIDOCAINE HCL (PF) 1 % IJ SOLN
INTRAMUSCULAR | Status: AC
  Filled 2016-01-23: qty 30

## 2016-01-23 MED ORDER — DEXAMETHASONE SODIUM PHOSPHATE 10 MG/ML IJ SOLN
INTRAMUSCULAR | Status: DC | PRN
  Administered 2016-01-23: 5 mg via INTRAVENOUS

## 2016-01-23 MED ORDER — ONDANSETRON HCL 4 MG/2ML IJ SOLN
INTRAMUSCULAR | Status: DC | PRN
  Administered 2016-01-23: 4 mg via INTRAVENOUS

## 2016-01-23 MED ORDER — MIDAZOLAM HCL 2 MG/2ML IJ SOLN
INTRAMUSCULAR | Status: DC | PRN
  Administered 2016-01-23: 2 mg via INTRAVENOUS

## 2016-01-23 MED ORDER — LABETALOL HCL 5 MG/ML IV SOLN
INTRAVENOUS | Status: AC
  Administered 2016-01-23: 5 mg via INTRAVENOUS
  Filled 2016-01-23: qty 4

## 2016-01-23 MED ORDER — PROMETHAZINE HCL 25 MG/ML IJ SOLN
INTRAMUSCULAR | Status: AC
  Administered 2016-01-23: 12.5 mg via INTRAVENOUS
  Filled 2016-01-23: qty 1

## 2016-01-23 MED ORDER — GLYCOPYRROLATE 0.2 MG/ML IJ SOLN
INTRAMUSCULAR | Status: DC | PRN
  Administered 2016-01-23: 0.2 mg via INTRAVENOUS

## 2016-01-23 MED ORDER — LABETALOL HCL 5 MG/ML IV SOLN
5.0000 mg | Freq: Once | INTRAVENOUS | Status: AC
  Administered 2016-01-23: 5 mg via INTRAVENOUS

## 2016-01-23 MED ORDER — LIDOCAINE HCL (CARDIAC) 20 MG/ML IV SOLN
INTRAVENOUS | Status: DC | PRN
  Administered 2016-01-23: 100 mg via INTRAVENOUS

## 2016-01-23 MED ORDER — PROMETHAZINE HCL 25 MG/ML IJ SOLN
12.5000 mg | Freq: Once | INTRAMUSCULAR | Status: AC
  Administered 2016-01-23: 12.5 mg via INTRAVENOUS

## 2016-01-23 MED ORDER — SODIUM CHLORIDE 0.9 % IV SOLN
INTRAVENOUS | Status: DC | PRN
  Administered 2016-01-23: 5 mL via INTRAMUSCULAR

## 2016-01-23 MED ORDER — PHENYLEPHRINE HCL 10 MG/ML IJ SOLN
INTRAMUSCULAR | Status: DC | PRN
  Administered 2016-01-23 (×2): 100 ug via INTRAVENOUS

## 2016-01-23 MED ORDER — SODIUM CHLORIDE 0.9 % IJ SOLN
INTRAMUSCULAR | Status: AC
  Filled 2016-01-23: qty 10

## 2016-01-23 MED ORDER — ONDANSETRON HCL 4 MG/2ML IJ SOLN
4.0000 mg | Freq: Once | INTRAMUSCULAR | Status: AC | PRN
  Administered 2016-01-23: 4 mg via INTRAVENOUS

## 2016-01-23 MED ORDER — FENTANYL CITRATE (PF) 100 MCG/2ML IJ SOLN
INTRAMUSCULAR | Status: DC | PRN
  Administered 2016-01-23 (×4): 50 ug via INTRAVENOUS

## 2016-01-23 MED ORDER — LACTATED RINGERS IV SOLN
INTRAVENOUS | Status: DC
Start: 2016-01-23 — End: 2016-01-23
  Administered 2016-01-23 (×2): via INTRAVENOUS

## 2016-01-23 MED ORDER — FENTANYL CITRATE (PF) 100 MCG/2ML IJ SOLN
25.0000 ug | INTRAMUSCULAR | Status: DC | PRN
  Administered 2016-01-23 (×2): 25 ug via INTRAVENOUS

## 2016-01-23 MED ORDER — CEFUROXIME SODIUM 1.5 G IJ SOLR
1.5000 g | INTRAMUSCULAR | Status: AC
  Administered 2016-01-23: 1.5 g via INTRAVENOUS
  Filled 2016-01-23: qty 1.5

## 2016-01-23 MED ORDER — EPHEDRINE SULFATE 50 MG/ML IJ SOLN
INTRAMUSCULAR | Status: DC | PRN
Start: 2016-01-23 — End: 2016-01-23
  Administered 2016-01-23: 5 mg via INTRAVENOUS

## 2016-01-23 MED ORDER — PROPOFOL 10 MG/ML IV BOLUS
INTRAVENOUS | Status: DC | PRN
  Administered 2016-01-23: 20 mg via INTRAVENOUS
  Administered 2016-01-23: 150 mg via INTRAVENOUS

## 2016-01-23 MED ORDER — FAMOTIDINE 20 MG PO TABS
ORAL_TABLET | ORAL | Status: AC
Start: 2016-01-23 — End: 2016-01-23
  Administered 2016-01-23: 20 mg via ORAL
  Filled 2016-01-23: qty 1

## 2016-01-23 MED ORDER — ONDANSETRON HCL 4 MG/2ML IJ SOLN
INTRAMUSCULAR | Status: AC
  Administered 2016-01-23: 4 mg via INTRAVENOUS
  Filled 2016-01-23: qty 2

## 2016-01-23 MED ORDER — OXYCODONE-ACETAMINOPHEN 5-325 MG PO TABS
ORAL_TABLET | ORAL | Status: AC
  Filled 2016-01-23: qty 1

## 2016-01-23 MED ORDER — FENTANYL CITRATE (PF) 100 MCG/2ML IJ SOLN
INTRAMUSCULAR | Status: AC
  Administered 2016-01-23: 25 ug via INTRAVENOUS
  Filled 2016-01-23: qty 2

## 2016-01-23 MED ORDER — HEPARIN SODIUM (PORCINE) 5000 UNIT/ML IJ SOLN
INTRAMUSCULAR | Status: AC
  Filled 2016-01-23: qty 1

## 2016-01-23 MED ORDER — OXYCODONE-ACETAMINOPHEN 5-325 MG PO TABS
1.0000 | ORAL_TABLET | ORAL | Status: DC | PRN
  Administered 2016-01-23: 1 via ORAL

## 2016-01-23 SURGICAL SUPPLY — 37 items
BAG DECANTER FOR FLEXI CONT (MISCELLANEOUS) ×2 IMPLANT
BLADE SURG SZ11 CARB STEEL (BLADE) ×2 IMPLANT
CANISTER SUCT 1200ML W/VALVE (MISCELLANEOUS) ×2 IMPLANT
CHLORAPREP W/TINT 26ML (MISCELLANEOUS) ×2 IMPLANT
COVER LIGHT HANDLE STERIS (MISCELLANEOUS) ×4 IMPLANT
DRAPE C-ARM XRAY 36X54 (DRAPES) ×2 IMPLANT
DRESSING TELFA 4X3 1S ST N-ADH (GAUZE/BANDAGES/DRESSINGS) ×2 IMPLANT
DRSG TEGADERM 2-3/8X2-3/4 SM (GAUZE/BANDAGES/DRESSINGS) ×2 IMPLANT
DRSG TEGADERM 4X4.75 (GAUZE/BANDAGES/DRESSINGS) ×2 IMPLANT
ELECT CAUTERY BLADE TIP 2.5 (TIP) ×2
ELECT REM PT RETURN 9FT ADLT (ELECTROSURGICAL) ×2
ELECTRODE CAUTERY BLDE TIP 2.5 (TIP) ×1 IMPLANT
ELECTRODE REM PT RTRN 9FT ADLT (ELECTROSURGICAL) ×1 IMPLANT
GLOVE EXAM LX STRL 7.5 (GLOVE) ×2 IMPLANT
GOWN STRL REUS W/ TWL LRG LVL3 (GOWN DISPOSABLE) ×2 IMPLANT
GOWN STRL REUS W/TWL LRG LVL3 (GOWN DISPOSABLE) ×4
IV NS 500ML (IV SOLUTION) ×2
IV NS 500ML BAXH (IV SOLUTION) ×1 IMPLANT
KIT PORT POWER 8FR ISP CVUE (Catheter) ×2 IMPLANT
KIT RM TURNOVER STRD PROC AR (KITS) ×2 IMPLANT
LABEL OR SOLS (LABEL) ×2 IMPLANT
MARKER SKIN DUAL TIP RULER LAB (MISCELLANEOUS) ×2 IMPLANT
NDL FILTER BLUNT 18X1 1/2 (NEEDLE) ×1 IMPLANT
NDL SAFETY 22GX1.5 (NEEDLE) ×2 IMPLANT
NEEDLE FILTER BLUNT 18X 1/2SAF (NEEDLE) ×1
NEEDLE FILTER BLUNT 18X1 1/2 (NEEDLE) ×1 IMPLANT
NS IRRIG 500ML POUR BTL (IV SOLUTION) ×2 IMPLANT
PACK PORT-A-CATH (MISCELLANEOUS) ×2 IMPLANT
SUT ETHILON 4-0 (SUTURE) ×4
SUT ETHILON 4-0 FS2 18XMFL BLK (SUTURE) ×2
SUT PROLENE 2 0 SH DA (SUTURE) ×4 IMPLANT
SUT VIC AB 3-0 SH 27 (SUTURE) ×2
SUT VIC AB 3-0 SH 27X BRD (SUTURE) ×1 IMPLANT
SUTURE ETHLN 4-0 FS2 18XMF BLK (SUTURE) ×2 IMPLANT
SYR 3ML LL SCALE MARK (SYRINGE) ×2 IMPLANT
SYRINGE 10CC LL (SYRINGE) ×2 IMPLANT
TAPE TRANSPORE STRL 2 31045 (GAUZE/BANDAGES/DRESSINGS) ×1 IMPLANT

## 2016-01-23 NOTE — Anesthesia Procedure Notes (Signed)
Procedure Name: LMA Insertion Date/Time: 01/23/2016 12:50 PM Performed by: Allean Found Pre-anesthesia Checklist: Patient identified, Emergency Drugs available, Patient being monitored, Suction available and Timeout performed Patient Re-evaluated:Patient Re-evaluated prior to inductionOxygen Delivery Method: Circle system utilized Preoxygenation: Pre-oxygenation with 100% oxygen Intubation Type: IV induction LMA: LMA inserted Number of attempts: 1 Tube secured with: Tape

## 2016-01-23 NOTE — Transfer of Care (Signed)
Immediate Anesthesia Transfer of Care Note  Patient: Jill Gross  Procedure(s) Performed: Procedure(s): INSERTION PORT-A-CATH (Right)  Patient Location: PACU  Anesthesia Type:General  Level of Consciousness: awake  Airway & Oxygen Therapy: Patient Spontanous Breathing and Patient connected to face mask oxygen  Post-op Assessment: Report given to RN and Post -op Vital signs reviewed and stable  Post vital signs: Reviewed and stable  Last Vitals:  Filed Vitals:   01/23/16 1125 01/23/16 1346  BP: 139/84 158/89  Pulse: 91 91  Temp: 36.8 C 36.6 C  Resp: 16 15    Complications: No apparent anesthesia complications

## 2016-01-23 NOTE — Anesthesia Preprocedure Evaluation (Signed)
Anesthesia Evaluation  Patient identified by MRN, date of birth, ID band Patient awake    Reviewed: Allergy & Precautions, H&P , NPO status , Patient's Chart, lab work & pertinent test results, reviewed documented beta blocker date and time   Airway Mallampati: II  TM Distance: >3 FB Neck ROM: full    Dental  (+) Upper Dentures, Lower Dentures   Pulmonary neg pulmonary ROS, COPD, former smoker,    Pulmonary exam normal        Cardiovascular Exercise Tolerance: Good hypertension, negative cardio ROS Normal cardiovascular exam Rate:Normal     Neuro/Psych PSYCHIATRIC DISORDERS negative neurological ROS  negative psych ROS   GI/Hepatic negative GI ROS, Neg liver ROS,   Endo/Other  negative endocrine ROSHypothyroidism   Renal/GU negative Renal ROS  negative genitourinary   Musculoskeletal   Abdominal   Peds  Hematology negative hematology ROS (+) anemia ,   Anesthesia Other Findings   Reproductive/Obstetrics negative OB ROS                             Anesthesia Physical Anesthesia Plan  ASA: II  Anesthesia Plan: General LMA   Post-op Pain Management:    Induction:   Airway Management Planned:   Additional Equipment:   Intra-op Plan:   Post-operative Plan:   Informed Consent: I have reviewed the patients History and Physical, chart, labs and discussed the procedure including the risks, benefits and alternatives for the proposed anesthesia with the patient or authorized representative who has indicated his/her understanding and acceptance.     Plan Discussed with: CRNA  Anesthesia Plan Comments:         Anesthesia Quick Evaluation

## 2016-01-23 NOTE — H&P (View-Only) (Signed)
Patient ID: Jill Gross, female   DOB: [birth year], 60 y.o.   MRN: [id number]  Chief Complaint  Patient presents with  . Lung Mass    Port Placement    Referred By Dr. Delight Hoh Reason for Referral insertion of Port-A-Cath  HPI Location, Quality, Duration, Severity, Timing, Context, Modifying Factors, Associated Signs and Symptoms.  Jill Gross is a 60 y.o. female.  Jill Gross is a 60 year old female who began experiencing hemoptysis several weeks ago. She did undergo bronchoscopy by Dr. Stevenson Clinch which revealed small cell carcinoma the lung. Imaging studies show near complete collapse of the left lower lobe bronchus and some mediastinal adenopathy. Patient is otherwise in good health except for some surgery on her foot and some back pain. 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 She was stepped on her foot by a horse causing multiple operations on her foot. She does smoke but recently just quit. She has extensive allergies but can take penicillin.  She does not complain of any significant shortness of breath. She states that her hemoptysis is actually improved since her bronchoscopy. She has some back pain but this is been a chronic issue for her.   Past Medical History  Diagnosis Date  . Anemia     pernicious  . Hypothyroid   . Osteopenia   . Insomnia   . Allergy   . COPD (chronic obstructive pulmonary disease) ([place])   . Depression   . Atrophic vaginitis   . Low back pain   . Fatigue   . Lumbago   . Tobacco use   . Hyperlipidemia   . Vitamin B12 deficiency   . Anxiety     Past Surgical History  Procedure Laterality Date  . Cholecystectomy    . Abdominal hysterectomy    . Hernia repair    . Ankle surgery      x4  . Carpal tunnel release  April 2015  . Endobronchial ultrasound N/A 01/07/2016    Procedure: ENDOBRONCHIAL ULTRASOUND;  Surgeon: Vilinda Boehringer, MD;  Location: [facility] ORS;  Service: Cardiopulmonary;  Laterality: N/A;    Family History  Problem  Relation Age of Onset  . Emphysema Mother   . Diabetes Father     Social History Social History  Substance Use Topics  . Smoking status: Former Smoker -- 1.00 packs/day    Types: Cigarettes  . Smokeless tobacco: Never Used  . Alcohol Use: No    Allergies  Allergen Reactions  . Skelaxin [Metaxalone] Anaphylaxis  . Effexor [Venlafaxine]   . Hctz [Hydrochlorothiazide] Other (See Comments)    cramps  . Macrobid [Nitrofurantoin]   . Neurontin [Gabapentin]   . Paxil [Paroxetine Hcl]   . Pravachol [Pravastatin Sodium] Other (See Comments)    aching  . Prozac [Fluoxetine Hcl]   . Sulfur   . Wellbutrin [Bupropion]   . Zoloft [Sertraline Hcl]     Current Outpatient Prescriptions  Medication Sig Dispense Refill  . albuterol (PROAIR HFA) 108 (90 Base) MCG/ACT inhaler Inhale 2 puffs into the lungs every 4 (four) hours as needed for wheezing or shortness of breath. 8.5 g 1  . ALPRAZolam (XANAX) 0.25 MG tablet Take 1 tablet (0.25 mg total) by mouth 2 (two) times daily as needed for anxiety. 45 tablet 0  . amitriptyline (ELAVIL) 10 MG tablet Take by mouth. Take 2 tablets at bedtime    . aspirin 81 MG tablet Take 81 mg by mouth daily.    . cimetidine (  TAGAMET) 200 MG tablet Take 150 mg by mouth every morning.    . cyanocobalamin (,VITAMIN B-12,) 1000 MCG/ML injection Inject 1 mL (1,000 mcg total) into the muscle once. 10 mL 1  . escitalopram (LEXAPRO) 20 MG tablet Take 20 mg by mouth daily.    Marland Kitchen glucose blood test strip 1 each by Other route daily. Reported on 01/07/2016    . levothyroxine (SYNTHROID, LEVOTHROID) 137 MCG tablet Take 137 mcg by mouth daily before breakfast.    . Multiple Vitamin (MULTIVITAMIN) tablet Take 1 tablet by mouth daily.    . simvastatin (ZOCOR) 10 MG tablet Take 10 mg by mouth daily.    Marland Kitchen zolpidem (AMBIEN) 10 MG tablet Take 1 tablet (10 mg total) by mouth at bedtime. 30 tablet 5   No current facility-administered medications for this visit.      Review of  Systems A complete review of systems was asked and was negative except for the following positive findings difficulty with vision, cough, hemoptysis, shortness of breath, headache.  Blood pressure 134/87, pulse 102, temperature 97.3 F (36.3 C), temperature source Tympanic, resp. rate 18, height '5\' 4"'$  (1.626 m), weight 156 lb 1.4 oz (70.8 kg).  Physical Exam CONSTITUTIONAL:  Pleasant, well-developed, well-nourished, and in no acute distress. EYES: Pupils equal and reactive to light, Sclera non-icteric EARS, NOSE, MOUTH AND THROAT:  The oropharynx was clear.  She is edentulous.  Oral mucosa pink and moist. LYMPH NODES:  Lymph nodes in the neck and axillae were normal RESPIRATORY:  Lungs were clear.  Normal respiratory effort without pathologic use of accessory muscles of respiration CARDIOVASCULAR: Heart was regular without murmurs.  There were no carotid bruits. GI: The abdomen was soft, nontender, and nondistended. There were no palpable masses. There was no hepatosplenomegaly. There were normal bowel sounds in all quadrants. GU:  Rectal deferred.   MUSCULOSKELETAL:  Normal muscle strength and tone.  No clubbing or cyanosis.   SKIN:  There were no pathologic skin lesions.  There were no nodules on palpation. NEUROLOGIC:  Sensation is normal.  Cranial nerves are grossly intact. PSYCH:  Oriented to person, place and time.  Mood and affect are normal.  Data Reviewed CT scans  I have personally reviewed the patient's imaging, laboratory findings and medical records.    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 I do not believe she is a candidate for surgical resection. I did review with her the indications and risks of Port-A-Cath insertion including risks of bleeding, infection, pneumothorax, and catheter malfunction.    Plan    After receiving informed consent we'll go ahead and set her up for surgery next week. All her questions were answered.  I did discuss her care today with Dr. Edwin Dada and Dr. Delight Hoh.     [name], MD 01/17/2016, 3:52 PM

## 2016-01-23 NOTE — Discharge Instructions (Signed)
Script for Percocet given to spouse by Dr. Genevive Bi  AMBULATORY SURGERY  DISCHARGE INSTRUCTIONS   1) The drugs that you were given will stay in your system until tomorrow so for the next 24 hours you should not:  A) Drive an automobile B) Make any legal decisions C) Drink any alcoholic beverage   2) You may resume regular meals tomorrow.  Today it is better to start with liquids and gradually work up to solid foods.  You may eat anything you prefer, but it is better to start with liquids, then soup and crackers, and gradually work up to solid foods.   3) Please notify your doctor immediately if you have any unusual bleeding, trouble breathing, redness and pain at the surgery site, drainage, fever, or pain not relieved by medication.    4) Additional Instructions:        Please contact your physician with any problems or Same Day Surgery at [phone], Monday through Friday 6 am to 4 pm, or [place] at [facility] number at [phone].

## 2016-01-23 NOTE — Interval H&P Note (Signed)
History and Physical Interval Note:  01/23/2016 11:14 AM  Jill Gross  has presented today for surgery, with the diagnosis of LUNG MASS  The various methods of treatment have been discussed with the patient and family. After consideration of risks, benefits and other options for treatment, the patient has consented to  Procedure(s): INSERTION PORT-A-CATH (N/A) as a surgical intervention .  The patient's history has been reviewed, patient examined, no change in status, stable for surgery.  I have reviewed the patient's chart and labs.  Questions were answered to the patient's satisfaction.     [name]

## 2016-01-23 NOTE — Op Note (Signed)
01/23/2016  1:52 PM  PATIENT:  Jill Gross  60 y.o. female  PRE-OPERATIVE DIAGNOSIS:  LUNG MASS  POST-OPERATIVE DIAGNOSIS:  lung mass  PROCEDURE:  Procedure(s): INSERTION PORT-A-CATH (Right)  SURGEON:  Surgeon(s) and Role:    * [name], MD - Primary  ASSISTANTS:    ANESTHESIA:     DICTATION:   The patient was brought to the operating suite and placed in the supine position. Using the ultrasound machine the Right internal jugular was identified and suitable for cannulation. The patient was then prepped and draped in usual sterile fashion. The right internal jugular vein was percutaneously catheterized. A wire was placed into the venous system under fluoroscopic guidance. An appropriate site was selected on the chest wall and a Port-A-Cath pocket was created. The catheter was tunneled from the port site up to the insertion site. The catheter was then inserted through a peel-away sheath and positioned at the appropriate level in the superior vena cava. The catheter was then assembled and aspirated and flushed easily. It was then secured to the anterior chest wall with interrupted Prolene sutures. The catheter was flushed one last time and the wounds were then closed. The subcutaneous tissues were closed with running absorbable sutures and the skin with nylon. Sterile dressings were applied. Patient was then transported to the recovery room in stable condition.   [name], MD

## 2016-01-24 ENCOUNTER — Inpatient Hospital Stay: Payer: Managed Care, Other (non HMO)

## 2016-01-24 ENCOUNTER — Ambulatory Visit: Payer: Managed Care, Other (non HMO)

## 2016-01-24 ENCOUNTER — Ambulatory Visit
Admission: RE | Admit: 2016-01-24 | Discharge: 2016-01-24 | Disposition: A | Discharge status: Home / Self Care | Payer: Managed Care, Other (non HMO) | Source: Ambulatory Visit | Attending: Radiation Oncology | Admitting: Radiation Oncology

## 2016-01-24 DIAGNOSIS — C3412 Malignant neoplasm of upper lobe, left bronchus or lung: Secondary | ICD-10-CM | POA: Diagnosis not present

## 2016-01-24 NOTE — Anesthesia Postprocedure Evaluation (Signed)
Anesthesia Post Note  Patient: Jill Gross  Procedure(s) Performed: Procedure(s) (LRB): INSERTION PORT-A-CATH (Right)  Patient location during evaluation: PACU Anesthesia Type: General Level of consciousness: awake and alert Pain management: pain level controlled Vital Signs Assessment: post-procedure vital signs reviewed and stable Respiratory status: spontaneous breathing, nonlabored ventilation, respiratory function stable and patient connected to nasal cannula oxygen Cardiovascular status: blood pressure returned to baseline and stable Postop Assessment: no signs of nausea or vomiting Anesthetic complications: no    Last Vitals:  Filed Vitals:   01/23/16 1527 01/23/16 1559  BP: 146/64 143/64  Pulse: 87 93  Temp: 36.6 C   Resp: 16 16    Last Pain:  Filed Vitals:   01/23/16 1603  PainSc: Travis Adams

## 2016-01-26 ENCOUNTER — Other Ambulatory Visit: Payer: Self-pay | Admitting: Unknown Physician Specialty

## 2016-01-28 ENCOUNTER — Inpatient Hospital Stay: Payer: Managed Care, Other (non HMO)

## 2016-01-28 ENCOUNTER — Inpatient Hospital Stay (HOSPITAL_BASED_OUTPATIENT_CLINIC_OR_DEPARTMENT_OTHER): Payer: Managed Care, Other (non HMO) | Admitting: Oncology

## 2016-01-28 VITALS — BP 138/84 | HR 85 | Resp 18

## 2016-01-28 VITALS — BP 92/65 | HR 103 | Temp 96.7°F | Resp 16 | Wt 158.1 lb

## 2016-01-28 DIAGNOSIS — F17210 Nicotine dependence, cigarettes, uncomplicated: Secondary | ICD-10-CM

## 2016-01-28 DIAGNOSIS — C3402 Malignant neoplasm of left main bronchus: Secondary | ICD-10-CM

## 2016-01-28 DIAGNOSIS — J449 Chronic obstructive pulmonary disease, unspecified: Secondary | ICD-10-CM

## 2016-01-28 DIAGNOSIS — Z7982 Long term (current) use of aspirin: Secondary | ICD-10-CM

## 2016-01-28 DIAGNOSIS — I517 Cardiomegaly: Secondary | ICD-10-CM

## 2016-01-28 DIAGNOSIS — R739 Hyperglycemia, unspecified: Secondary | ICD-10-CM

## 2016-01-28 DIAGNOSIS — E785 Hyperlipidemia, unspecified: Secondary | ICD-10-CM

## 2016-01-28 DIAGNOSIS — I959 Hypotension, unspecified: Secondary | ICD-10-CM | POA: Diagnosis not present

## 2016-01-28 DIAGNOSIS — R042 Hemoptysis: Secondary | ICD-10-CM

## 2016-01-28 DIAGNOSIS — F329 Major depressive disorder, single episode, unspecified: Secondary | ICD-10-CM

## 2016-01-28 DIAGNOSIS — N952 Postmenopausal atrophic vaginitis: Secondary | ICD-10-CM

## 2016-01-28 DIAGNOSIS — Z87891 Personal history of nicotine dependence: Secondary | ICD-10-CM

## 2016-01-28 DIAGNOSIS — M545 Low back pain: Secondary | ICD-10-CM

## 2016-01-28 DIAGNOSIS — C771 Secondary and unspecified malignant neoplasm of intrathoracic lymph nodes: Secondary | ICD-10-CM | POA: Diagnosis not present

## 2016-01-28 DIAGNOSIS — M8580 Other specified disorders of bone density and structure, unspecified site: Secondary | ICD-10-CM

## 2016-01-28 DIAGNOSIS — G4730 Sleep apnea, unspecified: Secondary | ICD-10-CM

## 2016-01-28 DIAGNOSIS — Z79899 Other long term (current) drug therapy: Secondary | ICD-10-CM

## 2016-01-28 DIAGNOSIS — E039 Hypothyroidism, unspecified: Secondary | ICD-10-CM

## 2016-01-28 DIAGNOSIS — C3412 Malignant neoplasm of upper lobe, left bronchus or lung: Secondary | ICD-10-CM | POA: Diagnosis not present

## 2016-01-28 DIAGNOSIS — I639 Cerebral infarction, unspecified: Secondary | ICD-10-CM

## 2016-01-28 DIAGNOSIS — F419 Anxiety disorder, unspecified: Secondary | ICD-10-CM

## 2016-01-28 LAB — CBC WITH DIFFERENTIAL/PLATELET
BASOS PCT: 1 %
Basophils Absolute: 0.1 10*3/uL (ref 0–0.1)
EOS ABS: 0.1 10*3/uL (ref 0–0.7)
Eosinophils Relative: 1 %
HCT: 37.2 % (ref 35.0–47.0)
HEMOGLOBIN: 12.9 g/dL (ref 12.0–16.0)
Lymphocytes Relative: 28 %
Lymphs Abs: 2.2 10*3/uL (ref 1.0–3.6)
MCH: 32 pg (ref 26.0–34.0)
MCHC: 34.7 g/dL (ref 32.0–36.0)
MCV: 92.4 fL (ref 80.0–100.0)
Monocytes Absolute: 0.4 10*3/uL (ref 0.2–0.9)
Monocytes Relative: 5 %
NEUTROS ABS: 5 10*3/uL (ref 1.4–6.5)
Neutrophils Relative %: 65 %
Platelets: 209 10*3/uL (ref 150–440)
RBC: 4.02 MIL/uL (ref 3.80–5.20)
RDW: 13.1 % (ref 11.5–14.5)
WBC: 7.7 10*3/uL (ref 3.6–11.0)

## 2016-01-28 LAB — COMPREHENSIVE METABOLIC PANEL
ALBUMIN: 3.8 g/dL (ref 3.5–5.0)
ALK PHOS: 84 U/L (ref 38–126)
ALT: 18 U/L (ref 14–54)
ANION GAP: 6 (ref 5–15)
AST: 17 U/L (ref 15–41)
BUN: 11 mg/dL (ref 6–20)
CALCIUM: 8.7 mg/dL — AB (ref 8.9–10.3)
CO2: 27 mmol/L (ref 22–32)
Chloride: 101 mmol/L (ref 101–111)
Creatinine, Ser: 0.84 mg/dL (ref 0.44–1.00)
GFR calc non Af Amer: >60 mL/min (ref >60)
GLUCOSE: 189 mg/dL — AB (ref 65–99)
POTASSIUM: 3.5 mmol/L (ref 3.5–5.1)
SODIUM: 134 mmol/L — AB (ref 135–145)
TOTAL PROTEIN: 7.1 g/dL (ref 6.5–8.1)
Total Bilirubin: 0.4 mg/dL (ref 0.3–1.2)

## 2016-01-28 MED ORDER — OXYCODONE-ACETAMINOPHEN 5-325 MG PO TABS: 1.0000 | ORAL_TABLET | ORAL | Status: DC | PRN

## 2016-01-28 MED ORDER — SODIUM CHLORIDE 0.9 % IV SOLN
Freq: Once | INTRAVENOUS | Status: AC
  Administered 2016-01-28: 12:00:00 via INTRAVENOUS
  Filled 2016-01-28: qty 5

## 2016-01-28 MED ORDER — HEPARIN SOD (PORK) LOCK FLUSH 100 UNIT/ML IV SOLN
500.0000 [IU] | Freq: Once | INTRAVENOUS | Status: DC
Start: 2016-01-28 — End: 2016-01-28
  Filled 2016-01-28: qty 5

## 2016-01-28 MED ORDER — SODIUM CHLORIDE 0.9 % IV SOLN
80.0000 mg/m2 | Freq: Once | INTRAVENOUS | Status: AC
  Administered 2016-01-28: 145 mg via INTRAVENOUS
  Filled 2016-01-28: qty 145

## 2016-01-28 MED ORDER — SODIUM CHLORIDE 0.9 % IV SOLN
100.0000 mg/m2 | Freq: Once | INTRAVENOUS | Status: AC
  Administered 2016-01-28: 180 mg via INTRAVENOUS
  Filled 2016-01-28: qty 9

## 2016-01-28 MED ORDER — SODIUM CHLORIDE 0.9 % IV SOLN
Freq: Once | INTRAVENOUS | Status: AC
  Administered 2016-01-28: 10:00:00 via INTRAVENOUS
  Filled 2016-01-28: qty 1000

## 2016-01-28 MED ORDER — PALONOSETRON HCL INJECTION 0.25 MG/5ML
0.2500 mg | Freq: Once | INTRAVENOUS | Status: AC
Start: 2016-01-28 — End: 2016-01-28
  Administered 2016-01-28: 0.25 mg via INTRAVENOUS
  Filled 2016-01-28: qty 5

## 2016-01-28 MED ORDER — POTASSIUM CHLORIDE 2 MEQ/ML IV SOLN
Freq: Once | INTRAVENOUS | Status: AC
  Administered 2016-01-28: 10:00:00 via INTRAVENOUS
  Filled 2016-01-28: qty 1000

## 2016-01-28 MED ORDER — SODIUM CHLORIDE 0.9% FLUSH
10.0000 mL | Freq: Once | INTRAVENOUS | Status: AC
  Administered 2016-01-28: 10 mL via INTRAVENOUS
  Filled 2016-01-28: qty 10

## 2016-01-28 NOTE — Progress Notes (Signed)
Patient blood pressure is low at 92/65 today and she reports history of having low blood pressure.

## 2016-01-29 ENCOUNTER — Other Ambulatory Visit: Payer: Self-pay | Admitting: Oncology

## 2016-01-29 ENCOUNTER — Inpatient Hospital Stay: Payer: Managed Care, Other (non HMO)

## 2016-01-29 VITALS — BP 149/88 | HR 94 | Temp 95.4°F | Resp 18

## 2016-01-29 DIAGNOSIS — C3402 Malignant neoplasm of left main bronchus: Secondary | ICD-10-CM

## 2016-01-29 MED ORDER — SODIUM CHLORIDE 0.9% FLUSH
10.0000 mL | INTRAVENOUS | Status: DC | PRN
  Administered 2016-01-29: 10 mL
  Filled 2016-01-29: qty 10

## 2016-01-29 MED ORDER — SODIUM CHLORIDE 0.9 % IV SOLN
Freq: Once | INTRAVENOUS | Status: DC
  Filled 2016-01-29: qty 4

## 2016-01-29 MED ORDER — HEPARIN SOD (PORK) LOCK FLUSH 100 UNIT/ML IV SOLN
INTRAVENOUS | Status: AC
  Filled 2016-01-29: qty 5

## 2016-01-29 MED ORDER — SODIUM CHLORIDE 0.9 % IV SOLN
10.0000 mg | Freq: Once | INTRAVENOUS | Status: AC
  Administered 2016-01-29: 10 mg via INTRAVENOUS
  Filled 2016-01-29: qty 1

## 2016-01-29 MED ORDER — SODIUM CHLORIDE 0.9 % IV SOLN
Freq: Once | INTRAVENOUS | Status: AC
  Administered 2016-01-29: 14:00:00 via INTRAVENOUS
  Filled 2016-01-29: qty 1000

## 2016-01-29 MED ORDER — SODIUM CHLORIDE 0.9 % IV SOLN
100.0000 mg/m2 | Freq: Once | INTRAVENOUS | Status: AC
  Administered 2016-01-29: 180 mg via INTRAVENOUS
  Filled 2016-01-29: qty 9

## 2016-01-29 MED ORDER — HEPARIN SOD (PORK) LOCK FLUSH 100 UNIT/ML IV SOLN
500.0000 [IU] | Freq: Once | INTRAVENOUS | Status: AC | PRN
  Administered 2016-01-29: 500 [IU]

## 2016-01-29 MED ORDER — SODIUM CHLORIDE 0.9 % IV SOLN
Freq: Once | INTRAVENOUS | Status: AC
  Administered 2016-01-29: 16:00:00 via INTRAVENOUS
  Filled 2016-01-29: qty 4

## 2016-01-29 NOTE — Progress Notes (Signed)
C/o nausea, dr [name] notified zofran given. States feeling less nauseous at discharge.

## 2016-01-30 ENCOUNTER — Inpatient Hospital Stay: Payer: Managed Care, Other (non HMO)

## 2016-01-30 DIAGNOSIS — C3402 Malignant neoplasm of left main bronchus: Secondary | ICD-10-CM | POA: Diagnosis not present

## 2016-01-30 MED ORDER — SODIUM CHLORIDE 0.9 % IV SOLN
Freq: Once | INTRAVENOUS | Status: AC
  Administered 2016-01-30: 14:00:00 via INTRAVENOUS
  Filled 2016-01-30: qty 1000

## 2016-01-30 MED ORDER — SODIUM CHLORIDE 0.9 % IV SOLN
100.0000 mg/m2 | Freq: Once | INTRAVENOUS | Status: AC
  Administered 2016-01-30: 180 mg via INTRAVENOUS
  Filled 2016-01-30: qty 9

## 2016-01-30 MED ORDER — HEPARIN SOD (PORK) LOCK FLUSH 100 UNIT/ML IV SOLN
500.0000 [IU] | Freq: Once | INTRAVENOUS | Status: AC
  Administered 2016-01-30: 500 [IU] via INTRAVENOUS
  Filled 2016-01-30: qty 5

## 2016-01-30 MED ORDER — SODIUM CHLORIDE 0.9 % IV SOLN
10.0000 mg | Freq: Once | INTRAVENOUS | Status: AC
  Administered 2016-01-30: 10 mg via INTRAVENOUS
  Filled 2016-01-30: qty 1

## 2016-01-30 MED ORDER — SODIUM CHLORIDE 0.9% FLUSH
10.0000 mL | INTRAVENOUS | Status: DC | PRN
  Administered 2016-01-30: 10 mL via INTRAVENOUS
  Filled 2016-01-30: qty 10

## 2016-02-03 NOTE — Progress Notes (Signed)
[place]  [phone] [phone]  ID: Jill Gross OB: 03/25/1956  MR#: [id number]  HEN#:[id number]  Patient Care Team: Jill Haddock, NP as PCP - General (Nurse Practitioner)  CHIEF COMPLAINT: Small cell lung cancer. Chief Complaint  Patient presents with  . Lung Cancer    INTERVAL HISTORY: Patient returns to clinic today to initiate cycle 1 of cisplatin and etoposide. She currently feels well and is asymptomatic. She is no longer complaining of chest pain. She has no neurologic complaints. She has a good appetite and denies weight loss. She denies any shortness of breath, hemoptysis, or cough. She denies any nausea, vomiting, constipation, or diarrhea. She has no urinary complaints. Patient offers no specific complaints today.  REVIEW OF SYSTEMS:   Review of Systems  Constitutional: Negative for fever, weight loss and malaise/fatigue.  Respiratory: Negative for cough, hemoptysis and shortness of breath.   Cardiovascular: Negative.  Negative for chest pain.  Gastrointestinal: Negative.   Genitourinary: Negative.   Musculoskeletal: Negative.   Neurological: Negative.  Negative for weakness.  Psychiatric/Behavioral: The patient is nervous/anxious.     As per HPI. Otherwise, a complete review of systems is negatve.  PAST MEDICAL HISTORY: Past Medical History  Diagnosis Date  . Anemia     pernicious  . Hypothyroid   . Osteopenia   . Insomnia   . Allergy   . COPD (chronic obstructive pulmonary disease) ([place])   . Depression   . Atrophic vaginitis   . Low back pain   . Fatigue   . Lumbago   . Tobacco use   . Hyperlipidemia   . Vitamin B12 deficiency   . Anxiety     PAST SURGICAL HISTORY: Past Surgical History  Procedure Laterality Date  . Cholecystectomy    . Abdominal hysterectomy    . Hernia repair    . Ankle surgery      x4  . Carpal tunnel release  April 2015  . Endobronchial ultrasound N/A 01/07/2016    Procedure:  ENDOBRONCHIAL ULTRASOUND;  Surgeon: Jill Boehringer, MD;  Location: [facility] ORS;  Service: Cardiopulmonary;  Laterality: N/A;    FAMILY HISTORY Family History  Problem Relation Age of Onset  . Emphysema Mother   . Diabetes Father        ADVANCED DIRECTIVES:    HEALTH MAINTENANCE: Social History  Substance Use Topics  . Smoking status: Former Smoker -- 1.00 packs/day    Types: Cigarettes  . Smokeless tobacco: Never Used  . Alcohol Use: No     Colonoscopy:  PAP:  Bone density:  Lipid panel:  Allergies  Allergen Reactions  . Skelaxin [Metaxalone] Anaphylaxis  . Effexor [Venlafaxine]   . Hctz [Hydrochlorothiazide] Other (See Comments)    cramps  . Macrobid [Nitrofurantoin]   . Neurontin [Gabapentin]   . Paxil [Paroxetine Hcl]   . Pravachol [Pravastatin Sodium] Other (See Comments)    aching  . Prozac [Fluoxetine Hcl]   . Sulfur   . Wellbutrin [Bupropion]   . Zoloft [Sertraline Hcl]     Current Outpatient Prescriptions  Medication Sig Dispense Refill  . albuterol (PROAIR HFA) 108 (90 Base) MCG/ACT inhaler Inhale 2 puffs into the lungs every 4 (four) hours as needed for wheezing or shortness of breath. 8.5 g 1  . ALPRAZolam (XANAX) 0.25 MG tablet Take 1 tablet (0.25 mg total) by mouth 2 (two) times daily as needed for anxiety. 45 tablet 0  . amitriptyline (ELAVIL) 10 MG tablet Take by mouth. Take 2 tablets  at bedtime    . cimetidine (TAGAMET) 200 MG tablet Take 150 mg by mouth at bedtime.     . cyanocobalamin (,VITAMIN B-12,) 1000 MCG/ML injection Inject 1 mL (1,000 mcg total) into the muscle once. 10 mL 1  . escitalopram (LEXAPRO) 20 MG tablet Take 20 mg by mouth at bedtime.     Marland Kitchen glucose blood test strip 1 each by Other route daily. Reported on 01/07/2016    . levothyroxine (SYNTHROID, LEVOTHROID) 137 MCG tablet Take 137 mcg by mouth daily before breakfast.    . lidocaine-prilocaine (EMLA) cream Apply to affected area once 30 g 3  . Multiple Vitamin (MULTIVITAMIN) tablet  Take 1 tablet by mouth daily.    . ondansetron (ZOFRAN) 8 MG tablet Take 1 tablet (8 mg total) by mouth 2 (two) times daily as needed. Start on the third day after cisplatin chemotherapy. 30 tablet 1  . oxyCODONE-acetaminophen (PERCOCET/ROXICET) 5-325 MG tablet Take 1 tablet by mouth every 4 (four) hours as needed for severe pain. 30 tablet 0  . prochlorperazine (COMPAZINE) 10 MG tablet Take 1 tablet (10 mg total) by mouth every 6 (six) hours as needed (Nausea or vomiting). 30 tablet 1  . simvastatin (ZOCOR) 10 MG tablet Take 10 mg by mouth daily at 6 PM.     . zolpidem (AMBIEN) 10 MG tablet Take 1 tablet (10 mg total) by mouth at bedtime. 30 tablet 3   No current facility-administered medications for this visit.    OBJECTIVE: Filed Vitals:   01/28/16 0859  BP: 92/65  Pulse: 103  Temp: 96.7 F (35.9 C)  Resp: 16     Body mass index is 27.12 kg/(m^2).    ECOG FS:0 - Asymptomatic  General: Well-developed, well-nourished, no acute distress. Eyes: Pink conjunctiva, anicteric sclera. Lungs: Clear to auscultation bilaterally. Heart: Regular rate and rhythm. No rubs, murmurs, or gallops. Abdomen: Soft, nontender, nondistended. No organomegaly noted, normoactive bowel sounds. Musculoskeletal: No edema, cyanosis, or clubbing. Neuro: Alert, answering all questions appropriately. Cranial nerves grossly intact. Skin: No rashes or petechiae noted. Psych: Normal affect.   LAB RESULTS:  Lab Results  Component Value Date   NA 134* 01/28/2016   K 3.5 01/28/2016   CL 101 01/28/2016   CO2 27 01/28/2016   GLUCOSE 189* 01/28/2016   BUN 11 01/28/2016   CREATININE 0.84 01/28/2016   CALCIUM 8.7* 01/28/2016   PROT 7.1 01/28/2016   ALBUMIN 3.8 01/28/2016   AST 17 01/28/2016   ALT 18 01/28/2016   ALKPHOS 84 01/28/2016   BILITOT 0.4 01/28/2016   GFRNONAA >60 01/28/2016   GFRAA >60 01/28/2016    Lab Results  Component Value Date   WBC 7.7 01/28/2016   NEUTROABS 5.0 01/28/2016   HGB 12.9  01/28/2016   HCT 37.2 01/28/2016   MCV 92.4 01/28/2016   PLT 209 01/28/2016     STUDIES: Mr [name] ME Contrast  2016-02-02  CLINICAL DATA:  Recently diagnosed left lung cancer.  Staging. EXAM: MRI HEAD WITHOUT AND WITH CONTRAST TECHNIQUE: Multiplanar, multiecho pulse sequences of the brain and surrounding structures were obtained without and with intravenous contrast. CONTRAST:  72m MULTIHANCE GADOBENATE DIMEGLUMINE 529 MG/ML IV SOLN COMPARISON:  None. FINDINGS: There is no evidence of acute infarct, intracranial hemorrhage, mass, midline shift, or extra-axial fluid collection. Ventricles and sulci are normal. Patchy T2 hyperintensities are present in the subcortical and deep cerebral white matter bilaterally, nonspecific but compatible with moderate chronic small vessel ischemic disease. A chronic lacunar infarct is  noted in the left thalamus. No abnormal enhancement is identified. Orbits are unremarkable. Mild left maxillary sinus mucosal thickening and a small right mastoid effusion are noted. Major intracranial vascular flow voids are preserved. No suspicious skull lesions are identified. IMPRESSION: 1. No evidence of intracranial metastases. 2. Moderate chronic small vessel ischemic disease. Electronically Signed   By: Logan Bores M.D.   On: 01/22/2016 15:24   Nm Pet Image Initial (pi) Skull Base To Thigh  01/22/2016  CLINICAL DATA:  Initial treatment strategy for lung mass. Bronchoscopy 01/07/2016. EXAM: NUCLEAR MEDICINE PET SKULL BASE TO THIGH TECHNIQUE: 12.5 mCi F-18 FDG was injected intravenously. Full-ring PET imaging was performed from the skull base to thigh after the radiotracer. CT data was obtained and used for attenuation correction and anatomic localization. FASTING BLOOD GLUCOSE:  Value: 98 mg/dl COMPARISON:  None. FINDINGS: NECK No hypermetabolic lymph nodes in the neck. CHEST LEFT infrahilar mass constricting the LEFT lower lobe bronchus measures approximately 4.3 cm with intense  metabolic activity (SUV max equal 13.7). Second hypermetabolic focus within the collapsed LEFT lower lobe which corresponds to a small hypodensity on the CT portion exam, measures approximately 3.4 cm and has intense metabolic activity with SUV max equal 03.5 Hypermetabolic subcarinal lymph node with SUV max equal 46.5 No hypermetabolic supraclavicular nodes. No additional pulmonary nodules. ABDOMEN/PELVIS No abnormal hypermetabolic activity within the liver, pancreas, adrenal glands, or spleen. No hypermetabolic lymph nodes in the abdomen or pelvis. SKELETON No focal hypermetabolic activity to suggest skeletal metastasis. IMPRESSION: 1. Hypermetabolic LEFT infrahilar mass consistent bronchogenic carcinoma versus metastatic adenopathy. 2. Hypermetabolic mass within the collapsed LEFT lower lobe may represent primary malignancy. 3. 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 Electronically Signed   By: Suzy Bouchard M.D.   On: 01/22/2016 14:35   Dg Chest Port 1 View  01/23/2016  CLINICAL DATA:  Port-A-Cath placement EXAM: CHEST 1 VIEW -intraoperative fluoroscopy COMPARISON:  December 26, 2015 FINDINGS: Port-A-Cath tip is in the superior vena cava. No pneumothorax. There is left base atelectasis. There is no edema or consolidation. Heart is mildly enlarged with pulmonary vascularity within normal limits. No demonstrable adenopathy. There is atherosclerotic calcification in the aorta. No bone lesions. IMPRESSION: Port-A-Cath tip in superior vena cava. No pneumothorax. Stable cardiac prominence. Left base atelectasis. Lungs elsewhere clear. Electronically Signed   By: Lowella Grip III M.D.   On: 01/23/2016 14:20   Dg C-arm 1-60 Min-no Report  01/23/2016  CLINICAL DATA: surgery C-ARM 1-60 MINUTES Fluoroscopy was utilized by the requesting physician.  No radiographic interpretation.    ASSESSMENT: Clinical at least stage IIa, T2 N1 M0 small cell lung cancer.  PLAN:    1. Small cell  lung cancer: PET scan, MRI, and CT scans all reviewed independently. Biopsy consistent with small cell carcinoma of the lung. Proceed with cycle 1 of 4 of cisplatin and etoposide. Patient will initiate her XRT on February 04, 2016. Return to clinic in 1 and 2 days for etoposide only, in 1 week for further evaluation, and 3 weeks for consideration of cycle 2.  She will be given treatment every 3 weeks and plan to reimage after cycle 4. 2. Hypotension: Patient is asymptomatic, monitor. 3. Hyperglycemia: Patient receives dexamethasone as a premedication, monitor.  Patient expressed understanding and was in agreement with this plan. She also understands that She can call clinic at any time with any questions, concerns, or complaints.   Lloyd Huger, MD   02/03/2016 7:04 AM

## 2016-02-04 ENCOUNTER — Ambulatory Visit: Payer: Managed Care, Other (non HMO)

## 2016-02-04 ENCOUNTER — Inpatient Hospital Stay (HOSPITAL_BASED_OUTPATIENT_CLINIC_OR_DEPARTMENT_OTHER): Payer: Managed Care, Other (non HMO) | Admitting: Oncology

## 2016-02-04 ENCOUNTER — Inpatient Hospital Stay: Payer: Managed Care, Other (non HMO)

## 2016-02-04 ENCOUNTER — Other Ambulatory Visit: Payer: Managed Care, Other (non HMO)

## 2016-02-04 ENCOUNTER — Ambulatory Visit: Payer: Managed Care, Other (non HMO) | Admitting: Oncology

## 2016-02-04 ENCOUNTER — Ambulatory Visit
Admission: RE | Admit: 2016-02-04 | Discharge: 2016-02-04 | Disposition: A | Discharge status: Home / Self Care | Payer: Managed Care, Other (non HMO) | Source: Ambulatory Visit | Attending: Radiation Oncology | Admitting: Radiation Oncology

## 2016-02-04 VITALS — BP 120/79 | HR 102 | Temp 97.2°F | Resp 18 | Wt 157.6 lb

## 2016-02-04 DIAGNOSIS — G4730 Sleep apnea, unspecified: Secondary | ICD-10-CM

## 2016-02-04 DIAGNOSIS — R042 Hemoptysis: Secondary | ICD-10-CM

## 2016-02-04 DIAGNOSIS — I959 Hypotension, unspecified: Secondary | ICD-10-CM

## 2016-02-04 DIAGNOSIS — M8580 Other specified disorders of bone density and structure, unspecified site: Secondary | ICD-10-CM

## 2016-02-04 DIAGNOSIS — I639 Cerebral infarction, unspecified: Secondary | ICD-10-CM

## 2016-02-04 DIAGNOSIS — R739 Hyperglycemia, unspecified: Secondary | ICD-10-CM | POA: Diagnosis not present

## 2016-02-04 DIAGNOSIS — C3402 Malignant neoplasm of left main bronchus: Secondary | ICD-10-CM

## 2016-02-04 DIAGNOSIS — Z87891 Personal history of nicotine dependence: Secondary | ICD-10-CM

## 2016-02-04 DIAGNOSIS — F17210 Nicotine dependence, cigarettes, uncomplicated: Secondary | ICD-10-CM

## 2016-02-04 DIAGNOSIS — G4700 Insomnia, unspecified: Secondary | ICD-10-CM

## 2016-02-04 DIAGNOSIS — C771 Secondary and unspecified malignant neoplasm of intrathoracic lymph nodes: Secondary | ICD-10-CM

## 2016-02-04 DIAGNOSIS — N952 Postmenopausal atrophic vaginitis: Secondary | ICD-10-CM

## 2016-02-04 DIAGNOSIS — M545 Low back pain: Secondary | ICD-10-CM

## 2016-02-04 DIAGNOSIS — Z7982 Long term (current) use of aspirin: Secondary | ICD-10-CM

## 2016-02-04 DIAGNOSIS — E039 Hypothyroidism, unspecified: Secondary | ICD-10-CM

## 2016-02-04 DIAGNOSIS — Z79899 Other long term (current) drug therapy: Secondary | ICD-10-CM

## 2016-02-04 DIAGNOSIS — F329 Major depressive disorder, single episode, unspecified: Secondary | ICD-10-CM

## 2016-02-04 DIAGNOSIS — I517 Cardiomegaly: Secondary | ICD-10-CM

## 2016-02-04 DIAGNOSIS — J449 Chronic obstructive pulmonary disease, unspecified: Secondary | ICD-10-CM

## 2016-02-04 DIAGNOSIS — F419 Anxiety disorder, unspecified: Secondary | ICD-10-CM

## 2016-02-04 DIAGNOSIS — E785 Hyperlipidemia, unspecified: Secondary | ICD-10-CM

## 2016-02-04 DIAGNOSIS — C3412 Malignant neoplasm of upper lobe, left bronchus or lung: Secondary | ICD-10-CM | POA: Diagnosis not present

## 2016-02-04 LAB — COMPREHENSIVE METABOLIC PANEL
ALT: 22 U/L (ref 14–54)
AST: 15 U/L (ref 15–41)
Albumin: 3.7 g/dL (ref 3.5–5.0)
Alkaline Phosphatase: 77 U/L (ref 38–126)
Anion gap: 6 (ref 5–15)
BUN: 13 mg/dL (ref 6–20)
CO2: 27 mmol/L (ref 22–32)
Calcium: 8.5 mg/dL — ABNORMAL LOW (ref 8.9–10.3)
Chloride: 97 mmol/L — ABNORMAL LOW (ref 101–111)
Creatinine, Ser: 0.9 mg/dL (ref 0.44–1.00)
Glucose, Bld: 91 mg/dL (ref 65–99)
POTASSIUM: 3.9 mmol/L (ref 3.5–5.1)
Sodium: 130 mmol/L — ABNORMAL LOW (ref 135–145)
TOTAL PROTEIN: 7.2 g/dL (ref 6.5–8.1)
Total Bilirubin: 0.2 mg/dL — ABNORMAL LOW (ref 0.3–1.2)

## 2016-02-04 LAB — CBC WITH DIFFERENTIAL/PLATELET
BASOS ABS: 0.1 10*3/uL (ref 0–0.1)
Basophils Relative: 1 %
EOS PCT: 1 %
Eosinophils Absolute: 0.1 10*3/uL (ref 0–0.7)
HEMATOCRIT: 36.5 % (ref 35.0–47.0)
Hemoglobin: 12.7 g/dL (ref 12.0–16.0)
LYMPHS ABS: 2.1 10*3/uL (ref 1.0–3.6)
LYMPHS PCT: 24 %
MCH: 32.2 pg (ref 26.0–34.0)
MCHC: 35 g/dL (ref 32.0–36.0)
MCV: 92 fL (ref 80.0–100.0)
MONO ABS: 0 10*3/uL — AB (ref 0.2–0.9)
MONOS PCT: 1 %
NEUTROS ABS: 6.5 10*3/uL (ref 1.4–6.5)
Neutrophils Relative %: 73 %
PLATELETS: 153 10*3/uL (ref 150–440)
RBC: 3.96 MIL/uL (ref 3.80–5.20)
RDW: 13 % (ref 11.5–14.5)
WBC: 8.9 10*3/uL (ref 3.6–11.0)

## 2016-02-04 NOTE — Progress Notes (Signed)
[place]  [phone] [phone]) [phone]  ID: PAISLI SILFIES OB: 1956/05/05  MR#: [id number]  WSF#:[id number]  Patient Care Team: [name], NP as PCP - General (Nurse Practitioner)  CHIEF COMPLAINT: Small cell lung cancer. Chief Complaint  Patient presents with  . Lung Cancer    INTERVAL HISTORY: Patient returns to clinic today for one week follow up after cycle 1 of cisplatin and etoposide. She currently feels well and is asymptomatic. She tolerated the infusion well with no side effects. Her only complaint is that she goes to sleep at night but wakes up and can't go back to sleep. She already takes ambien 10 mg as well as benadryl and amitriptyline. She does state that she has had this problem since her mother passed in 2002. She has no neurologic complaints. She has a good appetite and denies weight loss. She denies any chest pain, shortness of breath, hemoptysis, or cough. She denies any nausea, vomiting, constipation, or diarrhea. She has no urinary complaints. Patient offers no specific complaints today.  REVIEW OF SYSTEMS:   Review of Systems  Constitutional: Negative for fever, weight loss and malaise/fatigue.  Respiratory: Negative for cough, hemoptysis and shortness of breath.   Cardiovascular: Negative.  Negative for chest pain.  Gastrointestinal: Negative.   Genitourinary: Negative.   Musculoskeletal: Negative.   Neurological: Negative.  Negative for weakness.  Psychiatric/Behavioral: The patient has insomnia.     As per HPI. Otherwise, a complete review of systems is negatve.  PAST MEDICAL HISTORY: Past Medical History  Diagnosis Date  . Anemia     pernicious  . Hypothyroid   . Osteopenia   . Insomnia   . Allergy   . COPD (chronic obstructive pulmonary disease) ([place])   . Depression   . Atrophic vaginitis   . Low back pain   . Fatigue   . Lumbago   . Tobacco use   . Hyperlipidemia   . Vitamin B12 deficiency   . Anxiety      PAST SURGICAL HISTORY: Past Surgical History  Procedure Laterality Date  . Cholecystectomy    . Abdominal hysterectomy    . Hernia repair    . Ankle surgery      x4  . Carpal tunnel release  April 2015  . Endobronchial ultrasound N/A 01/07/2016    Procedure: ENDOBRONCHIAL ULTRASOUND;  Surgeon: Vilinda Boehringer, MD;  Location: [facility] ORS;  Service: Cardiopulmonary;  Laterality: N/A;    FAMILY HISTORY Family History  Problem Relation Age of Onset  . Emphysema Mother   . Diabetes Father        ADVANCED DIRECTIVES:    HEALTH MAINTENANCE: Social History  Substance Use Topics  . Smoking status: Former Smoker -- 1.00 packs/day    Types: Cigarettes  . Smokeless tobacco: Never Used  . Alcohol Use: No     Colonoscopy:  PAP:  Bone density:  Lipid panel:  Allergies  Allergen Reactions  . Skelaxin [Metaxalone] Anaphylaxis  . Effexor [Venlafaxine]   . Hctz [Hydrochlorothiazide] Other (See Comments)    cramps  . Macrobid [Nitrofurantoin]   . Neurontin [Gabapentin]   . Paxil [Paroxetine Hcl]   . Pravachol [Pravastatin Sodium] Other (See Comments)    aching  . Prozac [Fluoxetine Hcl]   . Sulfur   . Wellbutrin [Bupropion]   . Zoloft [Sertraline Hcl]     Current Outpatient Prescriptions  Medication Sig Dispense Refill  . albuterol (PROAIR HFA) 108 (90 Base) MCG/ACT inhaler Inhale 2 puffs into the lungs  every 4 (four) hours as needed for wheezing or shortness of breath. 8.5 g 1  . ALPRAZolam (XANAX) 0.25 MG tablet Take 1 tablet (0.25 mg total) by mouth 2 (two) times daily as needed for anxiety. 45 tablet 0  . amitriptyline (ELAVIL) 10 MG tablet Take by mouth. Take 2 tablets at bedtime    . cimetidine (TAGAMET) 200 MG tablet Take 150 mg by mouth at bedtime.     . cyanocobalamin (,VITAMIN B-12,) 1000 MCG/ML injection Inject 1 mL (1,000 mcg total) into the muscle once. 10 mL 1  . escitalopram (LEXAPRO) 20 MG tablet Take 20 mg by mouth at bedtime.     Marland Kitchen glucose blood test  strip 1 each by Other route daily. Reported on 01/07/2016    . levothyroxine (SYNTHROID, LEVOTHROID) 137 MCG tablet Take 137 mcg by mouth daily before breakfast.    . lidocaine-prilocaine (EMLA) cream Apply to affected area once 30 g 3  . Multiple Vitamin (MULTIVITAMIN) tablet Take 1 tablet by mouth daily.    . ondansetron (ZOFRAN) 8 MG tablet Take 1 tablet (8 mg total) by mouth 2 (two) times daily as needed. Start on the third day after cisplatin chemotherapy. 30 tablet 1  . oxyCODONE-acetaminophen (PERCOCET/ROXICET) 5-325 MG tablet Take 1 tablet by mouth every 4 (four) hours as needed for severe pain. 30 tablet 0  . prochlorperazine (COMPAZINE) 10 MG tablet Take 1 tablet (10 mg total) by mouth every 6 (six) hours as needed (Nausea or vomiting). 30 tablet 1  . simvastatin (ZOCOR) 10 MG tablet Take 10 mg by mouth daily at 6 PM.     . zolpidem (AMBIEN) 10 MG tablet Take 1 tablet (10 mg total) by mouth at bedtime. 30 tablet 3   No current facility-administered medications for this visit.    OBJECTIVE: Filed Vitals:   02/04/16 1423  BP: 120/79  Pulse: 102  Temp: 97.2 F (36.2 C)  Resp: 18     Body mass index is 27.04 kg/(m^2).    ECOG FS:0 - Asymptomatic  General: Well-developed, well-nourished, no acute distress. Eyes: Pink conjunctiva, anicteric sclera. Lungs: Clear to auscultation bilaterally. Heart: Regular rate and rhythm. No rubs, murmurs, or gallops. Abdomen: Soft, nontender, nondistended. No organomegaly noted, normoactive bowel sounds. Musculoskeletal: No edema, cyanosis, or clubbing. Neuro: Alert, answering all questions appropriately. Cranial nerves grossly intact. Skin: No rashes or petechiae noted. Psych: Normal affect.   LAB RESULTS:  Lab Results  Component Value Date   NA 130* 02/04/2016   K 3.9 02/04/2016   CL 97* 02/04/2016   CO2 27 02/04/2016   GLUCOSE 91 02/04/2016   BUN 13 02/04/2016   CREATININE 0.90 02/04/2016   CALCIUM 8.5* 02/04/2016   PROT 7.2  02/04/2016   ALBUMIN 3.7 02/04/2016   AST 15 02/04/2016   ALT 22 02/04/2016   ALKPHOS 77 02/04/2016   BILITOT 0.2* 02/04/2016   GFRNONAA >60 02/04/2016   GFRAA >60 02/04/2016    Lab Results  Component Value Date   WBC 8.9 02/04/2016   NEUTROABS 6.5 02/04/2016   HGB 12.7 02/04/2016   HCT 36.5 02/04/2016   MCV 92.0 02/04/2016   PLT 153 02/04/2016     STUDIES: Mr [name] SW Contrast  2016-02-12  CLINICAL DATA:  Recently diagnosed left lung cancer.  Staging. EXAM: MRI HEAD WITHOUT AND WITH CONTRAST TECHNIQUE: Multiplanar, multiecho pulse sequences of the brain and surrounding structures were obtained without and with intravenous contrast. CONTRAST:  5m MULTIHANCE GADOBENATE DIMEGLUMINE 529 MG/ML IV SOLN  COMPARISON:  None. FINDINGS: There is no evidence of acute infarct, intracranial hemorrhage, mass, midline shift, or extra-axial fluid collection. Ventricles and sulci are normal. Patchy T2 hyperintensities are present in the subcortical and deep cerebral white matter bilaterally, nonspecific but compatible with moderate chronic small vessel ischemic disease. 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 No abnormal enhancement is identified. Orbits are unremarkable. Mild left maxillary sinus mucosal thickening and a small right mastoid effusion are noted. Major intracranial vascular flow voids are preserved. No suspicious skull lesions are identified. IMPRESSION: 1. No evidence of intracranial metastases. 2. Moderate chronic small vessel ischemic disease. Electronically Signed   By: Logan Bores M.D.   On: 01/22/2016 15:24   Nm Pet Image Initial (pi) Skull Base To Thigh  01/22/2016  CLINICAL DATA:  Initial treatment strategy for lung mass. Bronchoscopy 01/07/2016. EXAM: NUCLEAR MEDICINE PET SKULL BASE TO THIGH TECHNIQUE: 12.5 mCi F-18 FDG was injected intravenously. Full-ring PET imaging was performed from the skull base to thigh after the radiotracer. CT data was obtained and used  for attenuation correction and anatomic localization. FASTING BLOOD GLUCOSE:  Value: 98 mg/dl COMPARISON:  None. FINDINGS: NECK No hypermetabolic lymph nodes in the neck. CHEST LEFT infrahilar mass constricting the LEFT lower lobe bronchus measures approximately 4.3 cm with intense metabolic activity (SUV max equal 13.7). Second hypermetabolic focus within the collapsed LEFT lower lobe which corresponds to a small hypodensity on the CT portion exam, measures approximately 3.4 cm and has intense metabolic activity with SUV max equal 78.5 Hypermetabolic subcarinal lymph node with SUV max equal 88.5 No hypermetabolic supraclavicular nodes. No additional pulmonary nodules. ABDOMEN/PELVIS No abnormal hypermetabolic activity within the liver, pancreas, adrenal glands, or spleen. No hypermetabolic lymph nodes in the abdomen or pelvis. SKELETON No focal hypermetabolic activity to suggest skeletal metastasis. IMPRESSION: 1. Hypermetabolic LEFT infrahilar mass consistent bronchogenic carcinoma versus metastatic adenopathy. 2. Hypermetabolic mass within the collapsed LEFT lower lobe may represent primary malignancy. 3. 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 Electronically Signed   By: Suzy Bouchard M.D.   On: 01/22/2016 14:35   Dg Chest Port 1 View  01/23/2016  CLINICAL DATA:  Port-A-Cath placement EXAM: CHEST 1 VIEW -intraoperative fluoroscopy COMPARISON:  December 26, 2015 FINDINGS: Port-A-Cath tip is in the superior vena cava. No pneumothorax. There is left base atelectasis. There is no edema or consolidation. Heart is mildly enlarged with pulmonary vascularity within normal limits. No demonstrable adenopathy. There is atherosclerotic calcification in the aorta. No bone lesions. IMPRESSION: Port-A-Cath tip in superior vena cava. No pneumothorax. Stable cardiac prominence. Left base atelectasis. Lungs elsewhere clear. Electronically Signed   By: Lowella Grip III M.D.   On: 01/23/2016 14:20     Dg C-arm 1-60 Min-no Report  01/23/2016  CLINICAL DATA: surgery C-ARM 1-60 MINUTES Fluoroscopy was utilized by the requesting physician.  No radiographic interpretation.    ASSESSMENT: Clinical at least stage IIa, T2 N1 M0 small cell lung cancer.  PLAN:    1. Small cell lung cancer: PET scan, MRI, and CT scans all reviewed independently. Biopsy consistent with small cell carcinoma of the lung. Patient tolerated cycle 1 of 4 of cisplatin and etoposide last week with significant side effects. Patient was suppose to initiate her XRT today but it was postponed for tomorrow, February 05, 2016. She will return to clinic in 2 weeks for consideration of cycle 2 of 4 of cisplatin and etoposide day 1 and 2 days for etoposide only. She will be  given treatment every 3 weeks and plan to reimage after cycle 4.  She will also require PCI at the conclusion of her treatments.  2. Hypotension: Resolved. Blood pressure in WNL today. 3. Hyperglycemia: Patient receives dexamethasone as a premedication, monitor. 4. Insomnia: Has been ongoing since her mother passed in 2002. Continue current medications.   Patient expressed understanding and was in agreement with this plan. She also understands that She can call clinic at any time with any questions, concerns, or complaints.   Mayra Reel, NP   02/04/2016 3:37 PM  Patient was seen and evaluated independently and I agree with the assessment and plan as dictated above.  Lloyd Huger, MD 02/07/2016 7:48 PM

## 2016-02-04 NOTE — Progress Notes (Signed)
Patient states she stopped smoking last week.

## 2016-02-05 ENCOUNTER — Ambulatory Visit
Admission: RE | Admit: 2016-02-05 | Discharge: 2016-02-05 | Disposition: A | Discharge status: Home / Self Care | Payer: Managed Care, Other (non HMO) | Source: Ambulatory Visit | Attending: Radiation Oncology | Admitting: Radiation Oncology

## 2016-02-05 ENCOUNTER — Telehealth: Payer: Self-pay | Admitting: Unknown Physician Specialty

## 2016-02-05 ENCOUNTER — Ambulatory Visit: Payer: Managed Care, Other (non HMO)

## 2016-02-05 NOTE — Telephone Encounter (Signed)
Medication was refilled by Malachy Mood 01/28/16. So I called it into the pharmacy because it was in the chart. I then called the patient to let her know that it had been called in and apologized to her for it not being done sooner.

## 2016-02-05 NOTE — Telephone Encounter (Signed)
Patient needs refill on her zolpidem (AMBIEN) 10 MG tablet she will be here today to pick it up. She stated that it was supposed to be ready and it was never send to her pharmacy when she was told it would be.

## 2016-02-06 ENCOUNTER — Ambulatory Visit
Admission: RE | Admit: 2016-02-06 | Discharge: 2016-02-06 | Disposition: A | Discharge status: Home / Self Care | Payer: Managed Care, Other (non HMO) | Source: Ambulatory Visit | Attending: Radiation Oncology | Admitting: Radiation Oncology

## 2016-02-06 DIAGNOSIS — C3412 Malignant neoplasm of upper lobe, left bronchus or lung: Secondary | ICD-10-CM | POA: Diagnosis not present

## 2016-02-07 ENCOUNTER — Ambulatory Visit
Admission: RE | Admit: 2016-02-07 | Discharge: 2016-02-07 | Disposition: A | Discharge status: Home / Self Care | Payer: Managed Care, Other (non HMO) | Source: Ambulatory Visit | Attending: Radiation Oncology | Admitting: Radiation Oncology

## 2016-02-07 DIAGNOSIS — C3412 Malignant neoplasm of upper lobe, left bronchus or lung: Secondary | ICD-10-CM | POA: Diagnosis not present

## 2016-02-08 ENCOUNTER — Ambulatory Visit
Admission: RE | Admit: 2016-02-08 | Discharge: 2016-02-08 | Disposition: A | Discharge status: Home / Self Care | Payer: Managed Care, Other (non HMO) | Source: Ambulatory Visit | Attending: Radiation Oncology | Admitting: Radiation Oncology

## 2016-02-08 DIAGNOSIS — C3412 Malignant neoplasm of upper lobe, left bronchus or lung: Secondary | ICD-10-CM | POA: Diagnosis not present

## 2016-02-11 ENCOUNTER — Ambulatory Visit
Admission: RE | Admit: 2016-02-11 | Discharge: 2016-02-11 | Disposition: A | Discharge status: Home / Self Care | Payer: Managed Care, Other (non HMO) | Source: Ambulatory Visit | Attending: Radiation Oncology | Admitting: Radiation Oncology

## 2016-02-11 DIAGNOSIS — C3412 Malignant neoplasm of upper lobe, left bronchus or lung: Secondary | ICD-10-CM | POA: Diagnosis not present

## 2016-02-12 ENCOUNTER — Ambulatory Visit
Admission: RE | Admit: 2016-02-12 | Discharge: 2016-02-12 | Disposition: A | Discharge status: Home / Self Care | Payer: Managed Care, Other (non HMO) | Source: Ambulatory Visit | Attending: Radiation Oncology | Admitting: Radiation Oncology

## 2016-02-12 ENCOUNTER — Other Ambulatory Visit: Payer: Self-pay | Admitting: *Deleted

## 2016-02-12 DIAGNOSIS — C3412 Malignant neoplasm of upper lobe, left bronchus or lung: Secondary | ICD-10-CM | POA: Diagnosis not present

## 2016-02-12 MED ORDER — SUCRALFATE 1 G PO TABS: 1.0000 g | ORAL_TABLET | Freq: Three times a day (TID) | ORAL | Status: DC

## 2016-02-13 ENCOUNTER — Ambulatory Visit
Admission: RE | Admit: 2016-02-13 | Discharge: 2016-02-13 | Disposition: A | Discharge status: Home / Self Care | Payer: Managed Care, Other (non HMO) | Source: Ambulatory Visit | Attending: Radiation Oncology | Admitting: Radiation Oncology

## 2016-02-13 DIAGNOSIS — C3412 Malignant neoplasm of upper lobe, left bronchus or lung: Secondary | ICD-10-CM | POA: Diagnosis not present

## 2016-02-14 ENCOUNTER — Ambulatory Visit
Admission: RE | Admit: 2016-02-14 | Discharge: 2016-02-14 | Disposition: A | Discharge status: Home / Self Care | Payer: Managed Care, Other (non HMO) | Source: Ambulatory Visit | Attending: Radiation Oncology | Admitting: Radiation Oncology

## 2016-02-14 DIAGNOSIS — C3412 Malignant neoplasm of upper lobe, left bronchus or lung: Secondary | ICD-10-CM | POA: Diagnosis not present

## 2016-02-15 ENCOUNTER — Ambulatory Visit
Admission: RE | Admit: 2016-02-15 | Discharge: 2016-02-15 | Disposition: A | Discharge status: Home / Self Care | Payer: Managed Care, Other (non HMO) | Source: Ambulatory Visit | Attending: Radiation Oncology | Admitting: Radiation Oncology

## 2016-02-15 DIAGNOSIS — C3412 Malignant neoplasm of upper lobe, left bronchus or lung: Secondary | ICD-10-CM | POA: Diagnosis not present

## 2016-02-18 ENCOUNTER — Ambulatory Visit
Admission: RE | Admit: 2016-02-18 | Discharge: 2016-02-18 | Disposition: A | Discharge status: Home / Self Care | Payer: Managed Care, Other (non HMO) | Source: Ambulatory Visit | Attending: Radiation Oncology | Admitting: Radiation Oncology

## 2016-02-18 ENCOUNTER — Inpatient Hospital Stay: Payer: Managed Care, Other (non HMO)

## 2016-02-18 ENCOUNTER — Ambulatory Visit: Payer: Managed Care, Other (non HMO)

## 2016-02-18 ENCOUNTER — Inpatient Hospital Stay: Payer: Managed Care, Other (non HMO) | Attending: Oncology | Admitting: Oncology

## 2016-02-18 VITALS — HR 94

## 2016-02-18 VITALS — BP 113/76 | HR 111 | Temp 97.2°F | Resp 18 | Wt 159.4 lb

## 2016-02-18 DIAGNOSIS — E785 Hyperlipidemia, unspecified: Secondary | ICD-10-CM

## 2016-02-18 DIAGNOSIS — R4702 Dysphasia: Secondary | ICD-10-CM

## 2016-02-18 DIAGNOSIS — E039 Hypothyroidism, unspecified: Secondary | ICD-10-CM

## 2016-02-18 DIAGNOSIS — R110 Nausea: Secondary | ICD-10-CM

## 2016-02-18 DIAGNOSIS — Z87891 Personal history of nicotine dependence: Secondary | ICD-10-CM | POA: Diagnosis not present

## 2016-02-18 DIAGNOSIS — F418 Other specified anxiety disorders: Secondary | ICD-10-CM | POA: Diagnosis not present

## 2016-02-18 DIAGNOSIS — Z5111 Encounter for antineoplastic chemotherapy: Secondary | ICD-10-CM | POA: Diagnosis not present

## 2016-02-18 DIAGNOSIS — C3402 Malignant neoplasm of left main bronchus: Secondary | ICD-10-CM | POA: Diagnosis not present

## 2016-02-18 DIAGNOSIS — R739 Hyperglycemia, unspecified: Secondary | ICD-10-CM

## 2016-02-18 DIAGNOSIS — C3412 Malignant neoplasm of upper lobe, left bronchus or lung: Secondary | ICD-10-CM | POA: Diagnosis not present

## 2016-02-18 DIAGNOSIS — C771 Secondary and unspecified malignant neoplasm of intrathoracic lymph nodes: Secondary | ICD-10-CM

## 2016-02-18 DIAGNOSIS — G4700 Insomnia, unspecified: Secondary | ICD-10-CM

## 2016-02-18 DIAGNOSIS — Z79899 Other long term (current) drug therapy: Secondary | ICD-10-CM | POA: Diagnosis not present

## 2016-02-18 DIAGNOSIS — J449 Chronic obstructive pulmonary disease, unspecified: Secondary | ICD-10-CM | POA: Diagnosis not present

## 2016-02-18 LAB — COMPREHENSIVE METABOLIC PANEL
ALK PHOS: 112 U/L (ref 38–126)
ALT: 22 U/L (ref 14–54)
ANION GAP: 9 (ref 5–15)
AST: 24 U/L (ref 15–41)
Albumin: 3.9 g/dL (ref 3.5–5.0)
BILIRUBIN TOTAL: 0.5 mg/dL (ref 0.3–1.2)
BUN: 9 mg/dL (ref 6–20)
CALCIUM: 8.5 mg/dL — AB (ref 8.9–10.3)
CO2: 24 mmol/L (ref 22–32)
CREATININE: 0.92 mg/dL (ref 0.44–1.00)
Chloride: 98 mmol/L — ABNORMAL LOW (ref 101–111)
GFR calc non Af Amer: >60 mL/min (ref >60)
GLUCOSE: 236 mg/dL — AB (ref 65–99)
Potassium: 3.7 mmol/L (ref 3.5–5.1)
SODIUM: 131 mmol/L — AB (ref 135–145)
TOTAL PROTEIN: 7.1 g/dL (ref 6.5–8.1)

## 2016-02-18 LAB — CBC WITH DIFFERENTIAL/PLATELET
Basophils Absolute: 0.1 10*3/uL (ref 0–0.1)
Basophils Relative: 1 %
Eosinophils Absolute: 0 10*3/uL (ref 0–0.7)
Eosinophils Relative: 0 %
HEMATOCRIT: 35.2 % (ref 35.0–47.0)
HEMOGLOBIN: 12.4 g/dL (ref 12.0–16.0)
LYMPHS ABS: 1.6 10*3/uL (ref 1.0–3.6)
LYMPHS PCT: 39 %
MCH: 32.9 pg (ref 26.0–34.0)
MCHC: 35.3 g/dL (ref 32.0–36.0)
MCV: 93.2 fL (ref 80.0–100.0)
MONOS PCT: 16 %
Monocytes Absolute: 0.6 10*3/uL (ref 0.2–0.9)
NEUTROS ABS: 1.8 10*3/uL (ref 1.4–6.5)
NEUTROS PCT: 44 %
Platelets: 321 10*3/uL (ref 150–440)
RBC: 3.78 MIL/uL — AB (ref 3.80–5.20)
RDW: 13.4 % (ref 11.5–14.5)
WBC: 4.1 10*3/uL (ref 3.6–11.0)

## 2016-02-18 MED ORDER — SODIUM CHLORIDE 0.9 % IV SOLN
100.0000 mg/m2 | Freq: Once | INTRAVENOUS | Status: AC
  Administered 2016-02-18: 180 mg via INTRAVENOUS
  Filled 2016-02-18: qty 9

## 2016-02-18 MED ORDER — HEPARIN SOD (PORK) LOCK FLUSH 100 UNIT/ML IV SOLN
500.0000 [IU] | Freq: Once | INTRAVENOUS | Status: AC | PRN
  Administered 2016-02-18: 500 [IU]
  Filled 2016-02-18: qty 5

## 2016-02-18 MED ORDER — SODIUM CHLORIDE 0.9 % IV SOLN
Freq: Once | INTRAVENOUS | Status: AC
  Administered 2016-02-18: 10:00:00 via INTRAVENOUS
  Filled 2016-02-18: qty 1000

## 2016-02-18 MED ORDER — SODIUM CHLORIDE 0.9 % IV SOLN
80.0000 mg/m2 | Freq: Once | INTRAVENOUS | Status: AC
  Administered 2016-02-18: 145 mg via INTRAVENOUS
  Filled 2016-02-18: qty 145

## 2016-02-18 MED ORDER — PROMETHAZINE HCL 25 MG PO TABS: 25.0000 mg | ORAL_TABLET | Freq: Four times a day (QID) | ORAL | Status: DC | PRN

## 2016-02-18 MED ORDER — POTASSIUM CHLORIDE 2 MEQ/ML IV SOLN
Freq: Once | INTRAVENOUS | Status: AC
  Administered 2016-02-18: 11:00:00 via INTRAVENOUS
  Filled 2016-02-18: qty 1000

## 2016-02-18 MED ORDER — FIRST-DUKES MOUTHWASH MT SUSP: 5.0000 mL | Freq: Four times a day (QID) | OROMUCOSAL | Status: DC | PRN

## 2016-02-18 MED ORDER — PALONOSETRON HCL INJECTION 0.25 MG/5ML
0.2500 mg | Freq: Once | INTRAVENOUS | Status: AC
Start: 2016-02-18 — End: 2016-02-18
  Administered 2016-02-18: 0.25 mg via INTRAVENOUS
  Filled 2016-02-18: qty 5

## 2016-02-18 MED ORDER — SODIUM CHLORIDE 0.9% FLUSH
10.0000 mL | INTRAVENOUS | Status: DC | PRN
  Administered 2016-02-18: 10 mL
  Filled 2016-02-18: qty 10

## 2016-02-18 MED ORDER — SODIUM CHLORIDE 0.9 % IV SOLN
Freq: Once | INTRAVENOUS | Status: AC
  Administered 2016-02-18: 13:00:00 via INTRAVENOUS
  Filled 2016-02-18: qty 5

## 2016-02-18 NOTE — Progress Notes (Signed)
Patient has episodes of nausea and the medication usually does not help.  She has been checking her glucose at home and got a reading in the 200's last Thursday with her usual being 80-100 on check this morning glucose was 161.  Was having trouble with swallowing and esophagus so Dr. Baruch Gouty prescribed Carafate.  She was giving her self monthly b-12 injections before diagnosis and would like to discuss if she is to continue.

## 2016-02-19 ENCOUNTER — Ambulatory Visit
Admission: RE | Admit: 2016-02-19 | Discharge: 2016-02-19 | Disposition: A | Discharge status: Home / Self Care | Payer: Managed Care, Other (non HMO) | Source: Ambulatory Visit | Attending: Radiation Oncology | Admitting: Radiation Oncology

## 2016-02-19 ENCOUNTER — Inpatient Hospital Stay: Payer: Managed Care, Other (non HMO)

## 2016-02-19 VITALS — BP 172/94 | HR 94 | Resp 18

## 2016-02-19 DIAGNOSIS — C3402 Malignant neoplasm of left main bronchus: Secondary | ICD-10-CM

## 2016-02-19 DIAGNOSIS — C3412 Malignant neoplasm of upper lobe, left bronchus or lung: Secondary | ICD-10-CM | POA: Diagnosis not present

## 2016-02-19 MED ORDER — SODIUM CHLORIDE 0.9 % IV SOLN
Freq: Once | INTRAVENOUS | Status: AC
  Administered 2016-02-19: 15:00:00 via INTRAVENOUS
  Filled 2016-02-19: qty 4

## 2016-02-19 MED ORDER — SODIUM CHLORIDE 0.9 % IV SOLN
Freq: Once | INTRAVENOUS | Status: AC
  Administered 2016-02-19: 14:00:00 via INTRAVENOUS
  Filled 2016-02-19: qty 1000

## 2016-02-19 MED ORDER — SODIUM CHLORIDE 0.9 % IV SOLN: Freq: Once | INTRAVENOUS | Status: DC

## 2016-02-19 MED ORDER — SODIUM CHLORIDE 0.9% FLUSH
10.0000 mL | INTRAVENOUS | Status: DC | PRN
  Administered 2016-02-19: 10 mL via INTRAVENOUS
  Filled 2016-02-19: qty 10

## 2016-02-19 MED ORDER — SODIUM CHLORIDE 0.9 % IV SOLN
100.0000 mg/m2 | Freq: Once | INTRAVENOUS | Status: AC
  Administered 2016-02-19: 180 mg via INTRAVENOUS
  Filled 2016-02-19: qty 9

## 2016-02-19 MED ORDER — HEPARIN SOD (PORK) LOCK FLUSH 100 UNIT/ML IV SOLN
500.0000 [IU] | Freq: Once | INTRAVENOUS | Status: AC
  Administered 2016-02-19: 500 [IU] via INTRAVENOUS

## 2016-02-19 MED ORDER — DEXAMETHASONE SODIUM PHOSPHATE 100 MG/10ML IJ SOLN
10.0000 mg | Freq: Once | INTRAMUSCULAR | Status: AC
  Administered 2016-02-19: 10 mg via INTRAVENOUS
  Filled 2016-02-19: qty 1

## 2016-02-19 MED ORDER — HEPARIN SOD (PORK) LOCK FLUSH 100 UNIT/ML IV SOLN
INTRAVENOUS | Status: AC
  Filled 2016-02-19: qty 5

## 2016-02-20 ENCOUNTER — Telehealth: Payer: Self-pay | Admitting: *Deleted

## 2016-02-20 ENCOUNTER — Inpatient Hospital Stay: Payer: Managed Care, Other (non HMO)

## 2016-02-20 ENCOUNTER — Ambulatory Visit
Admission: RE | Admit: 2016-02-20 | Discharge: 2016-02-20 | Disposition: A | Discharge status: Home / Self Care | Payer: Managed Care, Other (non HMO) | Source: Ambulatory Visit | Attending: Radiation Oncology | Admitting: Radiation Oncology

## 2016-02-20 VITALS — BP 162/86 | HR 88 | Temp 96.3°F | Resp 20

## 2016-02-20 DIAGNOSIS — C3402 Malignant neoplasm of left main bronchus: Secondary | ICD-10-CM | POA: Diagnosis not present

## 2016-02-20 DIAGNOSIS — C3412 Malignant neoplasm of upper lobe, left bronchus or lung: Secondary | ICD-10-CM | POA: Diagnosis not present

## 2016-02-20 MED ORDER — HEPARIN SOD (PORK) LOCK FLUSH 100 UNIT/ML IV SOLN
INTRAVENOUS | Status: AC
  Filled 2016-02-20: qty 5

## 2016-02-20 MED ORDER — SODIUM CHLORIDE 0.9 % IV SOLN
Freq: Once | INTRAVENOUS | Status: AC
  Administered 2016-02-20: 15:00:00 via INTRAVENOUS
  Filled 2016-02-20: qty 4

## 2016-02-20 MED ORDER — SODIUM CHLORIDE 0.9 % IV SOLN
100.0000 mg/m2 | Freq: Once | INTRAVENOUS | Status: AC
  Administered 2016-02-20: 180 mg via INTRAVENOUS
  Filled 2016-02-20: qty 9

## 2016-02-20 MED ORDER — HEPARIN SOD (PORK) LOCK FLUSH 100 UNIT/ML IV SOLN
500.0000 [IU] | Freq: Once | INTRAVENOUS | Status: AC | PRN
  Administered 2016-02-20: 500 [IU]

## 2016-02-20 MED ORDER — SODIUM CHLORIDE 0.9 % IV SOLN
Freq: Once | INTRAVENOUS | Status: AC
  Administered 2016-02-20: 14:00:00 via INTRAVENOUS
  Filled 2016-02-20: qty 1000

## 2016-02-20 NOTE — Telephone Encounter (Signed)
Pt came in for radiation today and complained of redness and soreness to her right cheek.  It is only on one side.  She received cisplatin on Monday and etoposide yesterday and is going to receive etop today.  Patient was concerned and wanted to let MD know.

## 2016-02-21 ENCOUNTER — Encounter: Payer: Self-pay | Admitting: Cardiothoracic Surgery

## 2016-02-21 ENCOUNTER — Ambulatory Visit
Admission: RE | Admit: 2016-02-21 | Discharge: 2016-02-21 | Disposition: A | Discharge status: Home / Self Care | Payer: Managed Care, Other (non HMO) | Source: Ambulatory Visit | Attending: Radiation Oncology | Admitting: Radiation Oncology

## 2016-02-21 DIAGNOSIS — C3412 Malignant neoplasm of upper lobe, left bronchus or lung: Secondary | ICD-10-CM | POA: Diagnosis not present

## 2016-02-22 ENCOUNTER — Ambulatory Visit
Admission: RE | Admit: 2016-02-22 | Discharge: 2016-02-22 | Disposition: A | Discharge status: Home / Self Care | Payer: Managed Care, Other (non HMO) | Source: Ambulatory Visit | Attending: Radiation Oncology | Admitting: Radiation Oncology

## 2016-02-22 DIAGNOSIS — C3412 Malignant neoplasm of upper lobe, left bronchus or lung: Secondary | ICD-10-CM | POA: Diagnosis not present

## 2016-02-24 NOTE — Progress Notes (Signed)
[place]  [phone] [phone]) [phone]  ID: Jill Gross OB: 12-23-1955  MR#: [id number]  AGT#:[id number]  Patient Care Team: [name], NP as PCP - General (Nurse Practitioner)  CHIEF COMPLAINT: Small cell lung cancer. Chief Complaint  Patient presents with  . Lung Cancer    INTERVAL HISTORY: Patient returns to clinic today for consideration of cycle 2 of cisplatin and etoposide. She has episodes of nausea and her current medication does not seem to help. She also has some mild dysphasia and was recently prescribed Carafate. She otherwise feels well. She has no neurologic complaints. She has a good appetite and denies weight loss. She denies any chest pain, shortness of breath, hemoptysis, or cough. She denies any vomiting, constipation, or diarrhea. She has no urinary complaints. Patient offers no further specific complaints today.  REVIEW OF SYSTEMS:   Review of Systems  Constitutional: Negative for fever, weight loss and malaise/fatigue.  Respiratory: Negative for cough, hemoptysis and shortness of breath.   Cardiovascular: Negative.  Negative for chest pain.  Gastrointestinal: Positive for nausea.  Genitourinary: Negative.   Musculoskeletal: Negative.   Neurological: Negative.  Negative for weakness.  Psychiatric/Behavioral: The patient has insomnia.     As per HPI. Otherwise, a complete review of systems is negatve.  PAST MEDICAL HISTORY: Past Medical History  Diagnosis Date  . Anemia     pernicious  . Hypothyroid   . Osteopenia   . Insomnia   . Allergy   . COPD (chronic obstructive pulmonary disease) ([place])   . Depression   . Atrophic vaginitis   . Low back pain   . Fatigue   . Lumbago   . Tobacco use   . Hyperlipidemia   . Vitamin B12 deficiency   . Anxiety     PAST SURGICAL HISTORY: Past Surgical History  Procedure Laterality Date  . Cholecystectomy    . Abdominal hysterectomy    . Hernia repair    . Ankle  surgery      x4  . Carpal tunnel release  April 2015  . Endobronchial ultrasound N/A 01/07/2016    Procedure: ENDOBRONCHIAL ULTRASOUND;  Surgeon: Vilinda Boehringer, MD;  Location: [facility] ORS;  Service: Cardiopulmonary;  Laterality: N/A;  . Portacath placement Right 01/23/2016    Procedure: INSERTION PORT-A-CATH;  Surgeon: [name], MD;  Location: [facility] ORS;  Service: General;  Laterality: Right;    FAMILY HISTORY Family History  Problem Relation Age of Onset  . Emphysema Mother   . Diabetes Father        ADVANCED DIRECTIVES:    HEALTH MAINTENANCE: Social History  Substance Use Topics  . Smoking status: Former Smoker -- 1.00 packs/day    Types: Cigarettes  . Smokeless tobacco: Never Used  . Alcohol Use: No     Colonoscopy:  PAP:  Bone density:  Lipid panel:  Allergies  Allergen Reactions  . Skelaxin [Metaxalone] Anaphylaxis  . Effexor [Venlafaxine]   . Hctz [Hydrochlorothiazide] Other (See Comments)    cramps  . Macrobid [Nitrofurantoin]   . Neurontin [Gabapentin]   . Paxil [Paroxetine Hcl]   . Pravachol [Pravastatin Sodium] Other (See Comments)    aching  . Prozac [Fluoxetine Hcl]   . Sulfur   . Wellbutrin [Bupropion]   . Zoloft [Sertraline Hcl]     Current Outpatient Prescriptions  Medication Sig Dispense Refill  . albuterol (PROAIR HFA) 108 (90 Base) MCG/ACT inhaler Inhale 2 puffs into the lungs every 4 (four) hours as needed for wheezing or  shortness of breath. 8.5 g 1  . ALPRAZolam (XANAX) 0.25 MG tablet Take 1 tablet (0.25 mg total) by mouth 2 (two) times daily as needed for anxiety. 45 tablet 0  . amitriptyline (ELAVIL) 10 MG tablet Take by mouth. Take 2 tablets at bedtime    . cimetidine (TAGAMET) 200 MG tablet Take 150 mg by mouth at bedtime.     . cyanocobalamin (,VITAMIN B-12,) 1000 MCG/ML injection Inject 1 mL (1,000 mcg total) into the muscle once. 10 mL 1  . escitalopram (LEXAPRO) 20 MG tablet Take 20 mg by mouth at bedtime.     Marland Kitchen glucose blood test  strip 1 each by Other route daily. Reported on 01/07/2016    . levothyroxine (SYNTHROID, LEVOTHROID) 137 MCG tablet Take 137 mcg by mouth daily before breakfast.    . lidocaine-prilocaine (EMLA) cream Apply to affected area once 30 g 3  . Multiple Vitamin (MULTIVITAMIN) tablet Take 1 tablet by mouth daily.    . ondansetron (ZOFRAN) 8 MG tablet Take 1 tablet (8 mg total) by mouth 2 (two) times daily as needed. Start on the third day after cisplatin chemotherapy. 30 tablet 1  . oxyCODONE-acetaminophen (PERCOCET/ROXICET) 5-325 MG tablet Take 1 tablet by mouth every 4 (four) hours as needed for severe pain. 30 tablet 0  . prochlorperazine (COMPAZINE) 10 MG tablet Take 1 tablet (10 mg total) by mouth every 6 (six) hours as needed (Nausea or vomiting). 30 tablet 1  . simvastatin (ZOCOR) 10 MG tablet Take 10 mg by mouth daily at 6 PM.     . sucralfate (CARAFATE) 1 g tablet Take 1 tablet (1 g total) by mouth 3 (three) times daily. 90 tablet 3  . zolpidem (AMBIEN) 10 MG tablet Take 1 tablet (10 mg total) by mouth at bedtime. 30 tablet 3  . Diphenhyd-Hydrocort-Nystatin (FIRST-DUKES MOUTHWASH) SUSP Use as directed 5 mLs in the mouth or throat 4 (four) times daily as needed. 1 Bottle 2  . promethazine (PHENERGAN) 25 MG tablet Take 1 tablet (25 mg total) by mouth every 6 (six) hours as needed for nausea or vomiting. 30 tablet 0   No current facility-administered medications for this visit.    OBJECTIVE: Filed Vitals:   02/18/16 0851  BP: 113/76  Pulse: 111  Temp: 97.2 F (36.2 C)  Resp: 18     Body mass index is 27.35 kg/(m^2).    ECOG FS:0 - Asymptomatic  General: Well-developed, well-nourished, no acute distress. Eyes: Pink conjunctiva, anicteric sclera. HEENT: Oropharynx clear without erythema or exudate. Lungs: Clear to auscultation bilaterally. Heart: Regular rate and rhythm. No rubs, murmurs, or gallops. Abdomen: Soft, nontender, nondistended. No organomegaly noted, normoactive bowel  sounds. Musculoskeletal: No edema, cyanosis, or clubbing. Neuro: Alert, answering all questions appropriately. Cranial nerves grossly intact. Skin: No rashes or petechiae noted. Psych: Normal affect.   LAB RESULTS:  Lab Results  Component Value Date   NA 131* 02/18/2016   K 3.7 02/18/2016   CL 98* 02/18/2016   CO2 24 02/18/2016   GLUCOSE 236* 02/18/2016   BUN 9 02/18/2016   CREATININE 0.92 02/18/2016   CALCIUM 8.5* 02/18/2016   PROT 7.1 02/18/2016   ALBUMIN 3.9 02/18/2016   AST 24 02/18/2016   ALT 22 02/18/2016   ALKPHOS 112 02/18/2016   BILITOT 0.5 02/18/2016   GFRNONAA >60 02/18/2016   GFRAA >60 02/18/2016    Lab Results  Component Value Date   WBC 4.1 02/18/2016   NEUTROABS 1.8 02/18/2016   HGB 12.4 02/18/2016  HCT 35.2 02/18/2016   MCV 93.2 02/18/2016   PLT 321 02/18/2016     STUDIES: No results found.  ASSESSMENT: Clinical stage IIa, T2 N1 M0 small cell lung cancer.  PLAN:    1. Small cell lung cancer: PET scan, MRI, and CT scans all reviewed independently. Biopsy consistent with small cell carcinoma of the lung. Proceed with cycle 2 of 4 of cisplatin and etoposide today. Continue daily XRT. Return to clinic in 1 and 2 days for etoposide only and then in 3 weeks for consideration of cycle 3 of cisplatin and etoposide. She will be given treatment every 3 weeks and plan to reimage after cycle 4.  She will also require PCI at the conclusion of her treatments.  2. Hypotension: Resolved. Blood pressure in WNL today. 3. Hyperglycemia: Patient receives dexamethasone as a premedication, monitor. 4. Insomnia: Has been ongoing since her mother passed in 2002. Continue current medications.  5. Nausea: Patient was given a prescription for Phenergan today. 6. Dysphagia: Continue Carafate as prescribed. Patient was also given a prescription for Dukes mouthwash.  Patient expressed understanding and was in agreement with this plan. She also understands that She can call  clinic at any time with any questions, concerns, or complaints.   Lloyd Huger, MD   02/24/2016 8:01 AM

## 2016-02-25 ENCOUNTER — Ambulatory Visit: Payer: Managed Care, Other (non HMO)

## 2016-02-25 ENCOUNTER — Ambulatory Visit
Admission: RE | Admit: 2016-02-25 | Discharge: 2016-02-25 | Disposition: A | Discharge status: Home / Self Care | Payer: Managed Care, Other (non HMO) | Source: Ambulatory Visit | Attending: Radiation Oncology | Admitting: Radiation Oncology

## 2016-02-26 ENCOUNTER — Other Ambulatory Visit: Payer: Self-pay | Admitting: *Deleted

## 2016-02-26 ENCOUNTER — Ambulatory Visit
Admission: RE | Admit: 2016-02-26 | Discharge: 2016-02-26 | Disposition: A | Discharge status: Home / Self Care | Payer: Managed Care, Other (non HMO) | Source: Ambulatory Visit | Attending: Radiation Oncology | Admitting: Radiation Oncology

## 2016-02-26 DIAGNOSIS — C3432 Malignant neoplasm of lower lobe, left bronchus or lung: Secondary | ICD-10-CM

## 2016-02-26 DIAGNOSIS — C3412 Malignant neoplasm of upper lobe, left bronchus or lung: Secondary | ICD-10-CM | POA: Diagnosis not present

## 2016-02-27 ENCOUNTER — Ambulatory Visit
Admission: RE | Admit: 2016-02-27 | Discharge: 2016-02-27 | Disposition: A | Discharge status: Home / Self Care | Payer: Managed Care, Other (non HMO) | Source: Ambulatory Visit | Attending: Radiation Oncology | Admitting: Radiation Oncology

## 2016-02-27 DIAGNOSIS — C3412 Malignant neoplasm of upper lobe, left bronchus or lung: Secondary | ICD-10-CM | POA: Diagnosis not present

## 2016-02-28 ENCOUNTER — Ambulatory Visit
Admission: RE | Admit: 2016-02-28 | Discharge: 2016-02-28 | Disposition: A | Discharge status: Home / Self Care | Payer: Managed Care, Other (non HMO) | Source: Ambulatory Visit | Attending: Radiation Oncology | Admitting: Radiation Oncology

## 2016-02-28 ENCOUNTER — Inpatient Hospital Stay: Payer: Managed Care, Other (non HMO)

## 2016-02-28 ENCOUNTER — Ambulatory Visit: Payer: Managed Care, Other (non HMO)

## 2016-02-29 ENCOUNTER — Other Ambulatory Visit: Payer: Self-pay | Admitting: *Deleted

## 2016-02-29 ENCOUNTER — Ambulatory Visit
Admission: RE | Admit: 2016-02-29 | Discharge: 2016-02-29 | Disposition: A | Discharge status: Home / Self Care | Payer: Managed Care, Other (non HMO) | Source: Ambulatory Visit | Attending: Radiation Oncology | Admitting: Radiation Oncology

## 2016-02-29 DIAGNOSIS — C3412 Malignant neoplasm of upper lobe, left bronchus or lung: Secondary | ICD-10-CM | POA: Diagnosis not present

## 2016-02-29 MED ORDER — MAGIC MOUTHWASH W/LIDOCAINE: 5.0000 mL | Freq: Three times a day (TID) | ORAL | Status: DC | PRN

## 2016-03-03 ENCOUNTER — Other Ambulatory Visit: Payer: Self-pay | Admitting: Family Medicine

## 2016-03-03 ENCOUNTER — Telehealth: Payer: Self-pay | Admitting: *Deleted

## 2016-03-03 ENCOUNTER — Inpatient Hospital Stay: Payer: Managed Care, Other (non HMO)

## 2016-03-03 ENCOUNTER — Ambulatory Visit: Admission: RE | Admit: 2016-03-03 | Payer: Managed Care, Other (non HMO) | Source: Ambulatory Visit

## 2016-03-03 DIAGNOSIS — C801 Malignant (primary) neoplasm, unspecified: Secondary | ICD-10-CM

## 2016-03-03 DIAGNOSIS — C3492 Malignant neoplasm of unspecified part of left bronchus or lung: Secondary | ICD-10-CM

## 2016-03-03 DIAGNOSIS — C3402 Malignant neoplasm of left main bronchus: Secondary | ICD-10-CM | POA: Diagnosis not present

## 2016-03-03 MED ORDER — SODIUM CHLORIDE 0.9 % IV SOLN
Freq: Once | INTRAVENOUS | Status: AC
  Administered 2016-03-03: 14:00:00 via INTRAVENOUS
  Filled 2016-03-03: qty 1000

## 2016-03-03 MED ORDER — SODIUM CHLORIDE 0.9 % IV SOLN
Freq: Once | INTRAVENOUS | Status: DC
  Filled 2016-03-03: qty 2

## 2016-03-03 MED ORDER — SODIUM CHLORIDE 0.9 % IV SOLN
Freq: Once | INTRAVENOUS | Status: AC
  Administered 2016-03-03: 14:00:00 via INTRAVENOUS
  Filled 2016-03-03: qty 2

## 2016-03-03 MED ORDER — SODIUM CHLORIDE 0.9 % IV SOLN
Freq: Once | INTRAVENOUS | Status: DC
  Filled 2016-03-03: qty 1000

## 2016-03-03 NOTE — Telephone Encounter (Signed)
Per Georgeanne Nim, AGNP-C come in for IVF appt for 130, I spoke with Maudie Mercury RN in XRT and she wants patient to see Dr Baruch Gouty at [place] has agreed to come in at 62

## 2016-03-03 NOTE — Telephone Encounter (Addendum)
She can't eat throat is very irritated and having difficulty swallowing, she is "very sick" Asking to see md per suggestion of XRT nurses. She has med for it, but it is not helping (carafate and Magic mouth wash

## 2016-03-04 ENCOUNTER — Other Ambulatory Visit: Payer: Self-pay | Admitting: *Deleted

## 2016-03-04 ENCOUNTER — Ambulatory Visit
Admission: RE | Admit: 2016-03-04 | Discharge: 2016-03-04 | Disposition: A | Discharge status: Home / Self Care | Payer: Managed Care, Other (non HMO) | Source: Ambulatory Visit | Attending: Radiation Oncology | Admitting: Radiation Oncology

## 2016-03-04 DIAGNOSIS — C3412 Malignant neoplasm of upper lobe, left bronchus or lung: Secondary | ICD-10-CM | POA: Diagnosis not present

## 2016-03-04 MED ORDER — DEXAMETHASONE 4 MG PO TABS: 4.0000 mg | ORAL_TABLET | Freq: Two times a day (BID) | ORAL | Status: DC

## 2016-03-04 MED ORDER — LANSOPRAZOLE 30 MG PO CPDR: 30.0000 mg | DELAYED_RELEASE_CAPSULE | Freq: Every day | ORAL | Status: DC

## 2016-03-04 MED ORDER — CIPROFLOXACIN HCL 500 MG PO TABS: 500.0000 mg | ORAL_TABLET | Freq: Two times a day (BID) | ORAL | Status: DC

## 2016-03-05 ENCOUNTER — Ambulatory Visit: Payer: Managed Care, Other (non HMO)

## 2016-03-05 ENCOUNTER — Ambulatory Visit
Admission: RE | Admit: 2016-03-05 | Discharge: 2016-03-05 | Disposition: A | Discharge status: Home / Self Care | Payer: Managed Care, Other (non HMO) | Source: Ambulatory Visit | Attending: Radiation Oncology | Admitting: Radiation Oncology

## 2016-03-06 ENCOUNTER — Ambulatory Visit: Payer: Managed Care, Other (non HMO)

## 2016-03-06 ENCOUNTER — Inpatient Hospital Stay: Payer: Managed Care, Other (non HMO)

## 2016-03-06 ENCOUNTER — Ambulatory Visit
Admission: RE | Admit: 2016-03-06 | Discharge: 2016-03-06 | Disposition: A | Discharge status: Home / Self Care | Payer: Managed Care, Other (non HMO) | Source: Ambulatory Visit | Attending: Radiation Oncology | Admitting: Radiation Oncology

## 2016-03-07 ENCOUNTER — Ambulatory Visit: Payer: Managed Care, Other (non HMO)

## 2016-03-07 ENCOUNTER — Ambulatory Visit
Admission: RE | Admit: 2016-03-07 | Discharge: 2016-03-07 | Disposition: A | Discharge status: Home / Self Care | Payer: Managed Care, Other (non HMO) | Source: Ambulatory Visit | Attending: Radiation Oncology | Admitting: Radiation Oncology

## 2016-03-10 ENCOUNTER — Other Ambulatory Visit: Payer: Self-pay | Admitting: *Deleted

## 2016-03-10 ENCOUNTER — Inpatient Hospital Stay: Payer: Managed Care, Other (non HMO)

## 2016-03-10 ENCOUNTER — Telehealth: Payer: Self-pay | Admitting: *Deleted

## 2016-03-10 ENCOUNTER — Inpatient Hospital Stay (HOSPITAL_BASED_OUTPATIENT_CLINIC_OR_DEPARTMENT_OTHER): Payer: Managed Care, Other (non HMO) | Admitting: Oncology

## 2016-03-10 ENCOUNTER — Ambulatory Visit
Admission: RE | Admit: 2016-03-10 | Discharge: 2016-03-10 | Disposition: A | Discharge status: Home / Self Care | Payer: Managed Care, Other (non HMO) | Source: Ambulatory Visit | Attending: Radiation Oncology | Admitting: Radiation Oncology

## 2016-03-10 ENCOUNTER — Inpatient Hospital Stay: Payer: Managed Care, Other (non HMO) | Attending: Oncology

## 2016-03-10 ENCOUNTER — Ambulatory Visit: Payer: Managed Care, Other (non HMO) | Admitting: Unknown Physician Specialty

## 2016-03-10 VITALS — BP 93/68 | HR 118 | Temp 97.8°F | Resp 18 | Wt 155.2 lb

## 2016-03-10 DIAGNOSIS — R739 Hyperglycemia, unspecified: Secondary | ICD-10-CM | POA: Diagnosis not present

## 2016-03-10 DIAGNOSIS — Z5111 Encounter for antineoplastic chemotherapy: Secondary | ICD-10-CM | POA: Insufficient documentation

## 2016-03-10 DIAGNOSIS — Z79899 Other long term (current) drug therapy: Secondary | ICD-10-CM | POA: Insufficient documentation

## 2016-03-10 DIAGNOSIS — C3412 Malignant neoplasm of upper lobe, left bronchus or lung: Secondary | ICD-10-CM | POA: Diagnosis not present

## 2016-03-10 DIAGNOSIS — E539 Vitamin B deficiency, unspecified: Secondary | ICD-10-CM | POA: Diagnosis not present

## 2016-03-10 DIAGNOSIS — F419 Anxiety disorder, unspecified: Secondary | ICD-10-CM | POA: Diagnosis not present

## 2016-03-10 DIAGNOSIS — M545 Low back pain: Secondary | ICD-10-CM | POA: Diagnosis not present

## 2016-03-10 DIAGNOSIS — N390 Urinary tract infection, site not specified: Secondary | ICD-10-CM

## 2016-03-10 DIAGNOSIS — J449 Chronic obstructive pulmonary disease, unspecified: Secondary | ICD-10-CM | POA: Insufficient documentation

## 2016-03-10 DIAGNOSIS — R42 Dizziness and giddiness: Secondary | ICD-10-CM | POA: Insufficient documentation

## 2016-03-10 DIAGNOSIS — R1310 Dysphagia, unspecified: Secondary | ICD-10-CM

## 2016-03-10 DIAGNOSIS — N952 Postmenopausal atrophic vaginitis: Secondary | ICD-10-CM

## 2016-03-10 DIAGNOSIS — E785 Hyperlipidemia, unspecified: Secondary | ICD-10-CM | POA: Diagnosis not present

## 2016-03-10 DIAGNOSIS — M8580 Other specified disorders of bone density and structure, unspecified site: Secondary | ICD-10-CM | POA: Insufficient documentation

## 2016-03-10 DIAGNOSIS — Z87891 Personal history of nicotine dependence: Secondary | ICD-10-CM | POA: Insufficient documentation

## 2016-03-10 DIAGNOSIS — E876 Hypokalemia: Secondary | ICD-10-CM

## 2016-03-10 DIAGNOSIS — I10 Essential (primary) hypertension: Secondary | ICD-10-CM | POA: Insufficient documentation

## 2016-03-10 DIAGNOSIS — C3492 Malignant neoplasm of unspecified part of left bronchus or lung: Secondary | ICD-10-CM

## 2016-03-10 DIAGNOSIS — C3490 Malignant neoplasm of unspecified part of unspecified bronchus or lung: Secondary | ICD-10-CM | POA: Diagnosis present

## 2016-03-10 DIAGNOSIS — I959 Hypotension, unspecified: Secondary | ICD-10-CM | POA: Insufficient documentation

## 2016-03-10 DIAGNOSIS — R5383 Other fatigue: Secondary | ICD-10-CM | POA: Diagnosis not present

## 2016-03-10 DIAGNOSIS — E039 Hypothyroidism, unspecified: Secondary | ICD-10-CM | POA: Insufficient documentation

## 2016-03-10 DIAGNOSIS — F418 Other specified anxiety disorders: Secondary | ICD-10-CM | POA: Insufficient documentation

## 2016-03-10 DIAGNOSIS — R531 Weakness: Secondary | ICD-10-CM

## 2016-03-10 DIAGNOSIS — G4700 Insomnia, unspecified: Secondary | ICD-10-CM | POA: Diagnosis not present

## 2016-03-10 DIAGNOSIS — D510 Vitamin B12 deficiency anemia due to intrinsic factor deficiency: Secondary | ICD-10-CM | POA: Diagnosis not present

## 2016-03-10 DIAGNOSIS — C3402 Malignant neoplasm of left main bronchus: Secondary | ICD-10-CM

## 2016-03-10 LAB — URINALYSIS COMPLETE WITH MICROSCOPIC (ARMC ONLY)
Bilirubin Urine: NEGATIVE
Glucose, UA: NEGATIVE mg/dL
Hgb urine dipstick: NEGATIVE
KETONES UR: NEGATIVE mg/dL
Leukocytes, UA: NEGATIVE
Nitrite: NEGATIVE
PROTEIN: NEGATIVE mg/dL
Specific Gravity, Urine: 1.008 (ref 1.005–1.030)
pH: 7 (ref 5.0–8.0)

## 2016-03-10 LAB — CBC WITH DIFFERENTIAL/PLATELET
BASOS ABS: 0 10*3/uL (ref 0–0.1)
Basophils Relative: 0 %
EOS ABS: 0 10*3/uL (ref 0–0.7)
EOS PCT: 0 %
HCT: 32.6 % — ABNORMAL LOW (ref 35.0–47.0)
Hemoglobin: 11.3 g/dL — ABNORMAL LOW (ref 12.0–16.0)
Lymphocytes Relative: 10 %
Lymphs Abs: 1.1 10*3/uL (ref 1.0–3.6)
MCH: 31.9 pg (ref 26.0–34.0)
MCHC: 34.8 g/dL (ref 32.0–36.0)
MCV: 91.7 fL (ref 80.0–100.0)
Monocytes Absolute: 0.7 10*3/uL (ref 0.2–0.9)
Monocytes Relative: 6 %
Neutro Abs: 9.7 10*3/uL — ABNORMAL HIGH (ref 1.4–6.5)
Neutrophils Relative %: 84 %
PLATELETS: 217 10*3/uL (ref 150–440)
RBC: 3.55 MIL/uL — AB (ref 3.80–5.20)
RDW: 14.5 % (ref 11.5–14.5)
WBC: 11.6 10*3/uL — AB (ref 3.6–11.0)

## 2016-03-10 LAB — COMPREHENSIVE METABOLIC PANEL
ALT: 15 U/L (ref 14–54)
AST: 15 U/L (ref 15–41)
Albumin: 3.4 g/dL — ABNORMAL LOW (ref 3.5–5.0)
Alkaline Phosphatase: 77 U/L (ref 38–126)
Anion gap: 8 (ref 5–15)
BUN: 11 mg/dL (ref 6–20)
CHLORIDE: 95 mmol/L — AB (ref 101–111)
CO2: 28 mmol/L (ref 22–32)
CREATININE: 0.86 mg/dL (ref 0.44–1.00)
Calcium: 8.2 mg/dL — ABNORMAL LOW (ref 8.9–10.3)
GFR calc non Af Amer: >60 mL/min (ref >60)
Glucose, Bld: 200 mg/dL — ABNORMAL HIGH (ref 65–99)
POTASSIUM: 3 mmol/L — AB (ref 3.5–5.1)
SODIUM: 131 mmol/L — AB (ref 135–145)
Total Bilirubin: 0.4 mg/dL (ref 0.3–1.2)
Total Protein: 6.8 g/dL (ref 6.5–8.1)

## 2016-03-10 MED ORDER — PROMETHAZINE HCL 25 MG PO TABS: 25.0000 mg | ORAL_TABLET | Freq: Four times a day (QID) | ORAL | Status: DC | PRN

## 2016-03-10 MED ORDER — SODIUM CHLORIDE 0.9 % IV SOLN
40.0000 meq | Freq: Once | INTRAVENOUS | Status: AC
  Administered 2016-03-10: 40 meq via INTRAVENOUS
  Filled 2016-03-10: qty 20

## 2016-03-10 MED ORDER — POTASSIUM CHLORIDE 20 MEQ PO PACK: 20.0000 meq | PACK | Freq: Two times a day (BID) | ORAL | Status: DC

## 2016-03-10 MED ORDER — SODIUM CHLORIDE 0.9 % IV SOLN: 40.0000 meq | Freq: Once | INTRAVENOUS | Status: DC

## 2016-03-10 MED ORDER — SODIUM CHLORIDE 0.9 % IV SOLN
Freq: Once | INTRAVENOUS | Status: AC
  Administered 2016-03-10: 11:00:00 via INTRAVENOUS
  Filled 2016-03-10: qty 4

## 2016-03-10 MED ORDER — HEPARIN SOD (PORK) LOCK FLUSH 100 UNIT/ML IV SOLN
500.0000 [IU] | Freq: Once | INTRAVENOUS | Status: AC
  Administered 2016-03-10: 500 [IU] via INTRAVENOUS
  Filled 2016-03-10: qty 5

## 2016-03-10 MED ORDER — SODIUM CHLORIDE 0.9 % IV SOLN
Freq: Once | INTRAVENOUS | Status: AC
  Administered 2016-03-10: 10:00:00 via INTRAVENOUS
  Filled 2016-03-10: qty 1000

## 2016-03-10 NOTE — Progress Notes (Signed)
Patient has been feeling weak with dizziness upon standing, blood pressure today is 93/68 in office today.  I offered her a wheelchair but patient declined and I advised her to stand very slowly also make sure she is stable before walking .  Has constant nausea with occasional "dry heaves" that is slightly relieved with Phenergan.  Was started on Prevacid and Decadron for the throat spasms she has been having when she has the spasms her pain is 9/10 on pain scale.  Was prescribed Cipro by Dr. Baruch Gouty for UTI but was not able to tolerate due to vomiting so she started taking Cystex OTC for symptoms and no longer having dysuria.

## 2016-03-10 NOTE — Telephone Encounter (Signed)
They are unable to get potassium packets and have none in stock (they are on back order) Recommend using tab , break in half then dissolve in water. Per Dr [name] , ok to use tabs. Patient informed while she was in infusion suite and verbalized understanding

## 2016-03-11 ENCOUNTER — Inpatient Hospital Stay: Payer: Managed Care, Other (non HMO)

## 2016-03-11 ENCOUNTER — Ambulatory Visit
Admission: RE | Admit: 2016-03-11 | Discharge: 2016-03-11 | Disposition: A | Discharge status: Home / Self Care | Payer: Managed Care, Other (non HMO) | Source: Ambulatory Visit | Attending: Radiation Oncology | Admitting: Radiation Oncology

## 2016-03-11 DIAGNOSIS — C3412 Malignant neoplasm of upper lobe, left bronchus or lung: Secondary | ICD-10-CM | POA: Diagnosis not present

## 2016-03-12 ENCOUNTER — Inpatient Hospital Stay: Payer: Managed Care, Other (non HMO)

## 2016-03-12 ENCOUNTER — Ambulatory Visit
Admission: RE | Admit: 2016-03-12 | Discharge: 2016-03-12 | Disposition: A | Discharge status: Home / Self Care | Payer: Managed Care, Other (non HMO) | Source: Ambulatory Visit | Attending: Radiation Oncology | Admitting: Radiation Oncology

## 2016-03-12 DIAGNOSIS — C3412 Malignant neoplasm of upper lobe, left bronchus or lung: Secondary | ICD-10-CM | POA: Diagnosis not present

## 2016-03-12 LAB — URINE CULTURE

## 2016-03-13 ENCOUNTER — Ambulatory Visit
Admission: RE | Admit: 2016-03-13 | Discharge: 2016-03-13 | Disposition: A | Discharge status: Home / Self Care | Payer: Managed Care, Other (non HMO) | Source: Ambulatory Visit | Attending: Radiation Oncology | Admitting: Radiation Oncology

## 2016-03-13 DIAGNOSIS — C3412 Malignant neoplasm of upper lobe, left bronchus or lung: Secondary | ICD-10-CM | POA: Diagnosis not present

## 2016-03-14 ENCOUNTER — Ambulatory Visit
Admission: RE | Admit: 2016-03-14 | Discharge: 2016-03-14 | Disposition: A | Discharge status: Home / Self Care | Payer: Managed Care, Other (non HMO) | Source: Ambulatory Visit | Attending: Radiation Oncology | Admitting: Radiation Oncology

## 2016-03-14 DIAGNOSIS — C3412 Malignant neoplasm of upper lobe, left bronchus or lung: Secondary | ICD-10-CM | POA: Diagnosis not present

## 2016-03-16 NOTE — Progress Notes (Signed)
[place]  Telephone:(336) [phone] [phone]) [phone]  ID: MADDIX KLIEWER OB: [birth year]  MR#: [id number]  VOJ#:[id number]  Patient Care Team: [name], NP as PCP - General (Nurse Practitioner)  CHIEF COMPLAINT: Small cell lung cancer. Chief Complaint  Patient presents with  . Lung Cancer    INTERVAL HISTORY: Patient returns to clinic today for consideration of cycle 3 of cisplatin and etoposide. She has Increased weakness and fatigue as well as dizziness upon standing. She has persistent nausea that is helped with Phenergan. She also recently had symptoms of UTI and was placed on Cipro, but could not tolerate this secondary to increased vomiting. Her UTI symptoms have resolved. She has no neurologic complaints. She has a poor appetite, but denies weight loss. She denies any chest pain, shortness of breath, hemoptysis, or cough. She denies any vomiting, constipation, or diarrhea. Patient offers no further specific complaints today.  REVIEW OF SYSTEMS:   Review of Systems  Constitutional: Positive for malaise/fatigue. Negative for fever and weight loss.  Respiratory: Negative for cough, hemoptysis and shortness of breath.   Cardiovascular: Negative.  Negative for chest pain.  Gastrointestinal: Positive for nausea.  Genitourinary: Negative.   Musculoskeletal: Negative.   Neurological: Positive for dizziness and weakness.  Psychiatric/Behavioral: The patient has insomnia.     As per HPI. Otherwise, a complete review of systems is negatve.  PAST MEDICAL HISTORY: Past Medical History  Diagnosis Date  . Anemia     pernicious  . Hypothyroid   . Osteopenia   . Insomnia   . Allergy   . COPD (chronic obstructive pulmonary disease) ([place])   . Depression   . Atrophic vaginitis   . Low back pain   . Fatigue   . Lumbago   . Tobacco use   . Hyperlipidemia   . Vitamin B12 deficiency   . Anxiety     PAST SURGICAL HISTORY: Past Surgical History  Procedure  Laterality Date  . Cholecystectomy    . Abdominal hysterectomy    . Hernia repair    . Ankle surgery      x4  . Carpal tunnel release  April 2015  . Endobronchial ultrasound N/A 01/07/2016    Procedure: ENDOBRONCHIAL ULTRASOUND;  Surgeon: Vilinda Boehringer, MD;  Location: [facility] ORS;  Service: Cardiopulmonary;  Laterality: N/A;  . Portacath placement Right 01/23/2016    Procedure: INSERTION PORT-A-CATH;  Surgeon: [name], MD;  Location: [facility] ORS;  Service: General;  Laterality: Right;    FAMILY HISTORY Family History  Problem Relation Age of Onset  . Emphysema Mother   . Diabetes Father        ADVANCED DIRECTIVES:    HEALTH MAINTENANCE: Social History  Substance Use Topics  . Smoking status: Former Smoker -- 1.00 packs/day    Types: Cigarettes  . Smokeless tobacco: Never Used  . Alcohol Use: No     Colonoscopy:  PAP:  Bone density:  Lipid panel:  Allergies  Allergen Reactions  . Skelaxin [Metaxalone] Anaphylaxis  . Effexor [Venlafaxine]   . Hctz [Hydrochlorothiazide] Other (See Comments)    cramps  . Macrobid [Nitrofurantoin]   . Neurontin [Gabapentin]   . Paxil [Paroxetine Hcl]   . Pravachol [Pravastatin Sodium] Other (See Comments)    aching  . Prozac [Fluoxetine Hcl]   . Sulfur   . Wellbutrin [Bupropion]   . Zoloft [Sertraline Hcl]     Current Outpatient Prescriptions  Medication Sig Dispense Refill  . albuterol (PROAIR HFA) 108 (90 Base) MCG/ACT inhaler  Inhale 2 puffs into the lungs every 4 (four) hours as needed for wheezing or shortness of breath. 8.5 g 1  . ALPRAZolam (XANAX) 0.25 MG tablet Take 1 tablet (0.25 mg total) by mouth 2 (two) times daily as needed for anxiety. 45 tablet 0  . amitriptyline (ELAVIL) 10 MG tablet Take by mouth. Take 2 tablets at bedtime    . cimetidine (TAGAMET) 200 MG tablet Take 150 mg by mouth at bedtime.     . cyanocobalamin (,VITAMIN B-12,) 1000 MCG/ML injection Inject 1 mL (1,000 mcg total) into the muscle once. 10 mL 1    . dexamethasone (DECADRON) 4 MG tablet Take 1 tablet (4 mg total) by mouth 2 (two) times daily with a meal. Take 1 tablet 2x daily for 3 days, then begin 1 tablet daily until seen by Dr, Baruch Gouty. 15 tablet 0  . Diphenhyd-Hydrocort-Nystatin (FIRST-DUKES MOUTHWASH) SUSP Use as directed 5 mLs in the mouth or throat 4 (four) times daily as needed. 1 Bottle 2  . escitalopram (LEXAPRO) 20 MG tablet Take 20 mg by mouth at bedtime.     Marland Kitchen glucose blood test strip 1 each by Other route daily. Reported on 01/07/2016    . lansoprazole (PREVACID) 30 MG capsule Take 1 capsule (30 mg total) by mouth daily at 12 noon. 20 capsule 0  . levothyroxine (SYNTHROID, LEVOTHROID) 137 MCG tablet Take 137 mcg by mouth daily before breakfast.    . lidocaine-prilocaine (EMLA) cream Apply to affected area once 30 g 3  . magic mouthwash w/lidocaine SOLN Take 5 mLs by mouth 3 (three) times daily as needed for mouth pain. 237 mL 2  . Multiple Vitamin (MULTIVITAMIN) tablet Take 1 tablet by mouth daily.    . ondansetron (ZOFRAN) 8 MG tablet Take 1 tablet (8 mg total) by mouth 2 (two) times daily as needed. Start on the third day after cisplatin chemotherapy. 30 tablet 1  . oxyCODONE-acetaminophen (PERCOCET/ROXICET) 5-325 MG tablet Take 1 tablet by mouth every 4 (four) hours as needed for severe pain. 30 tablet 0  . prochlorperazine (COMPAZINE) 10 MG tablet Take 1 tablet (10 mg total) by mouth every 6 (six) hours as needed (Nausea or vomiting). 30 tablet 1  . promethazine (PHENERGAN) 25 MG tablet Take 1 tablet (25 mg total) by mouth every 6 (six) hours as needed for nausea or vomiting. 30 tablet 0  . simvastatin (ZOCOR) 10 MG tablet Take 10 mg by mouth daily at 6 PM.     . sucralfate (CARAFATE) 1 g tablet Take 1 tablet (1 g total) by mouth 3 (three) times daily. 90 tablet 3  . zolpidem (AMBIEN) 10 MG tablet Take 1 tablet (10 mg total) by mouth at bedtime. 30 tablet 3  . potassium chloride (KLOR-CON) 20 MEQ packet Take 20 mEq by mouth  2 (two) times daily. 30 packet 2   No current facility-administered medications for this visit.    OBJECTIVE: Filed Vitals:   03/10/16 0931  BP: 93/68  Pulse: 118  Temp: 97.8 F (36.6 C)  Resp: 18     Body mass index is 26.63 kg/(m^2).    ECOG FS:1 - Symptomatic but completely ambulatory  General: Well-developed, well-nourished, no acute distress. Eyes: Pink conjunctiva, anicteric sclera. HEENT: Oropharynx clear without erythema or exudate. Lungs: Clear to auscultation bilaterally. Heart: Regular rate and rhythm. No rubs, murmurs, or gallops. Abdomen: Soft, nontender, nondistended. No organomegaly noted, normoactive bowel sounds. Musculoskeletal: No edema, cyanosis, or clubbing. Neuro: Alert, answering all questions appropriately. Cranial  nerves grossly intact. Skin: No rashes or petechiae noted. Psych: Normal affect.   LAB RESULTS:  Lab Results  Component Value Date   NA 131* 03/10/2016   K 3.0* 03/10/2016   CL 95* 03/10/2016   CO2 28 03/10/2016   GLUCOSE 200* 03/10/2016   BUN 11 03/10/2016   CREATININE 0.86 03/10/2016   CALCIUM 8.2* 03/10/2016   PROT 6.8 03/10/2016   ALBUMIN 3.4* 03/10/2016   AST 15 03/10/2016   ALT 15 03/10/2016   ALKPHOS 77 03/10/2016   BILITOT 0.4 03/10/2016   GFRNONAA >60 03/10/2016   GFRAA >60 03/10/2016    Lab Results  Component Value Date   WBC 11.6* 03/10/2016   NEUTROABS 9.7* 03/10/2016   HGB 11.3* 03/10/2016   HCT 32.6* 03/10/2016   MCV 91.7 03/10/2016   PLT 217 03/10/2016     STUDIES: No results found.  ASSESSMENT: Clinical stage IIa, T2 N1 M0 small cell lung cancer.  PLAN:    1. Small cell lung cancer: PET scan, MRI, and CT scans all reviewed independently. Biopsy consistent with small cell carcinoma of the lung. Delay cycle 3 of 4 of cisplatin and etoposide today. Continue daily XRT. Return to clinic in 1 week for reconsideration of cycle 3 of cisplatin and etoposide. She will be given treatment every 3 weeks and plan  to reimage after cycle 4.  She will also require PCI at the conclusion of her treatments.  2. Hypotension: Patient will receive 1 L IV fluids today. 3. Hyperglycemia: Patient receives dexamethasone as a premedication, monitor. 4. Insomnia: Has been ongoing since her mother passed in 2002. Continue current medications.  5. Nausea: Continue Phenergan as prescribed. 6. Dysphagia: Continue Carafate as prescribed. Patient was also given a prescription for Dukes mouthwash. 7. UTI: Urinalysis is negative, a culture still growing Escherichia coli. Now the patient's nausea is improved, we will reattempt treatment with Cipro.  Patient expressed understanding and was in agreement with this plan. She also understands that She can call clinic at any time with any questions, concerns, or complaints.   Lloyd Huger, MD   03/16/2016 10:35 AM

## 2016-03-17 ENCOUNTER — Inpatient Hospital Stay: Payer: Managed Care, Other (non HMO)

## 2016-03-17 ENCOUNTER — Inpatient Hospital Stay (HOSPITAL_BASED_OUTPATIENT_CLINIC_OR_DEPARTMENT_OTHER): Payer: Managed Care, Other (non HMO) | Admitting: Oncology

## 2016-03-17 ENCOUNTER — Ambulatory Visit
Admission: RE | Admit: 2016-03-17 | Discharge: 2016-03-17 | Disposition: A | Discharge status: Home / Self Care | Payer: Managed Care, Other (non HMO) | Source: Ambulatory Visit | Attending: Radiation Oncology | Admitting: Radiation Oncology

## 2016-03-17 VITALS — BP 125/85 | HR 90 | Temp 96.5°F | Resp 16 | Wt 158.3 lb

## 2016-03-17 DIAGNOSIS — R531 Weakness: Secondary | ICD-10-CM

## 2016-03-17 DIAGNOSIS — Z87891 Personal history of nicotine dependence: Secondary | ICD-10-CM

## 2016-03-17 DIAGNOSIS — C3490 Malignant neoplasm of unspecified part of unspecified bronchus or lung: Secondary | ICD-10-CM

## 2016-03-17 DIAGNOSIS — E539 Vitamin B deficiency, unspecified: Secondary | ICD-10-CM

## 2016-03-17 DIAGNOSIS — N390 Urinary tract infection, site not specified: Secondary | ICD-10-CM | POA: Diagnosis not present

## 2016-03-17 DIAGNOSIS — Z79899 Other long term (current) drug therapy: Secondary | ICD-10-CM

## 2016-03-17 DIAGNOSIS — R1310 Dysphagia, unspecified: Secondary | ICD-10-CM

## 2016-03-17 DIAGNOSIS — J449 Chronic obstructive pulmonary disease, unspecified: Secondary | ICD-10-CM

## 2016-03-17 DIAGNOSIS — E785 Hyperlipidemia, unspecified: Secondary | ICD-10-CM

## 2016-03-17 DIAGNOSIS — F418 Other specified anxiety disorders: Secondary | ICD-10-CM

## 2016-03-17 DIAGNOSIS — E039 Hypothyroidism, unspecified: Secondary | ICD-10-CM

## 2016-03-17 DIAGNOSIS — D510 Vitamin B12 deficiency anemia due to intrinsic factor deficiency: Secondary | ICD-10-CM

## 2016-03-17 DIAGNOSIS — C3402 Malignant neoplasm of left main bronchus: Secondary | ICD-10-CM

## 2016-03-17 DIAGNOSIS — C3412 Malignant neoplasm of upper lobe, left bronchus or lung: Secondary | ICD-10-CM | POA: Diagnosis not present

## 2016-03-17 DIAGNOSIS — N952 Postmenopausal atrophic vaginitis: Secondary | ICD-10-CM

## 2016-03-17 DIAGNOSIS — R42 Dizziness and giddiness: Secondary | ICD-10-CM

## 2016-03-17 DIAGNOSIS — M8580 Other specified disorders of bone density and structure, unspecified site: Secondary | ICD-10-CM

## 2016-03-17 DIAGNOSIS — R5383 Other fatigue: Secondary | ICD-10-CM

## 2016-03-17 DIAGNOSIS — I959 Hypotension, unspecified: Secondary | ICD-10-CM | POA: Diagnosis not present

## 2016-03-17 DIAGNOSIS — F419 Anxiety disorder, unspecified: Secondary | ICD-10-CM

## 2016-03-17 DIAGNOSIS — R739 Hyperglycemia, unspecified: Secondary | ICD-10-CM | POA: Diagnosis not present

## 2016-03-17 DIAGNOSIS — M545 Low back pain: Secondary | ICD-10-CM

## 2016-03-17 DIAGNOSIS — I10 Essential (primary) hypertension: Secondary | ICD-10-CM

## 2016-03-17 DIAGNOSIS — G4700 Insomnia, unspecified: Secondary | ICD-10-CM

## 2016-03-17 LAB — COMPREHENSIVE METABOLIC PANEL
ALK PHOS: 67 U/L (ref 38–126)
ALT: 18 U/L (ref 14–54)
AST: 14 U/L — ABNORMAL LOW (ref 15–41)
Albumin: 3.6 g/dL (ref 3.5–5.0)
Anion gap: 6 (ref 5–15)
BUN: 20 mg/dL (ref 6–20)
CALCIUM: 8.5 mg/dL — AB (ref 8.9–10.3)
CO2: 29 mmol/L (ref 22–32)
CREATININE: 0.9 mg/dL (ref 0.44–1.00)
Chloride: 95 mmol/L — ABNORMAL LOW (ref 101–111)
Glucose, Bld: 277 mg/dL — ABNORMAL HIGH (ref 65–99)
Potassium: 3.8 mmol/L (ref 3.5–5.1)
Sodium: 130 mmol/L — ABNORMAL LOW (ref 135–145)
Total Bilirubin: 0.4 mg/dL (ref 0.3–1.2)
Total Protein: 6.8 g/dL (ref 6.5–8.1)

## 2016-03-17 LAB — CBC WITH DIFFERENTIAL/PLATELET
Basophils Absolute: 0 10*3/uL (ref 0–0.1)
Basophils Relative: 0 %
EOS PCT: 0 %
Eosinophils Absolute: 0 10*3/uL (ref 0–0.7)
HCT: 31.4 % — ABNORMAL LOW (ref 35.0–47.0)
HEMOGLOBIN: 11 g/dL — AB (ref 12.0–16.0)
LYMPHS ABS: 0.8 10*3/uL — AB (ref 1.0–3.6)
LYMPHS PCT: 8 %
MCH: 33.2 pg (ref 26.0–34.0)
MCHC: 34.9 g/dL (ref 32.0–36.0)
MCV: 95.1 fL (ref 80.0–100.0)
Monocytes Absolute: 0.9 10*3/uL (ref 0.2–0.9)
Monocytes Relative: 9 %
Neutro Abs: 9.2 10*3/uL — ABNORMAL HIGH (ref 1.4–6.5)
Neutrophils Relative %: 83 %
Platelets: 236 10*3/uL (ref 150–440)
RBC: 3.31 MIL/uL — AB (ref 3.80–5.20)
RDW: 16.9 % — ABNORMAL HIGH (ref 11.5–14.5)
WBC: 11 10*3/uL (ref 3.6–11.0)

## 2016-03-17 MED ORDER — POTASSIUM CHLORIDE 2 MEQ/ML IV SOLN
Freq: Once | INTRAVENOUS | Status: AC
  Administered 2016-03-17: 12:00:00 via INTRAVENOUS
  Filled 2016-03-17: qty 1000

## 2016-03-17 MED ORDER — CISPLATIN CHEMO INJECTION 100MG/100ML
80.0000 mg/m2 | Freq: Once | INTRAVENOUS | Status: AC
  Administered 2016-03-17: 145 mg via INTRAVENOUS
  Filled 2016-03-17: qty 145

## 2016-03-17 MED ORDER — SODIUM CHLORIDE 0.9 % IV SOLN
Freq: Once | INTRAVENOUS | Status: AC
  Administered 2016-03-17: 14:00:00 via INTRAVENOUS
  Filled 2016-03-17: qty 5

## 2016-03-17 MED ORDER — PALONOSETRON HCL INJECTION 0.25 MG/5ML
0.2500 mg | Freq: Once | INTRAVENOUS | Status: AC
Start: 2016-03-17 — End: 2016-03-17
  Administered 2016-03-17: 0.25 mg via INTRAVENOUS
  Filled 2016-03-17: qty 5

## 2016-03-17 MED ORDER — SODIUM CHLORIDE 0.9 % IV SOLN
Freq: Once | INTRAVENOUS | Status: AC
  Administered 2016-03-17: 12:00:00 via INTRAVENOUS
  Filled 2016-03-17: qty 1000

## 2016-03-17 MED ORDER — HEPARIN SOD (PORK) LOCK FLUSH 100 UNIT/ML IV SOLN
500.0000 [IU] | Freq: Once | INTRAVENOUS | Status: AC | PRN
  Administered 2016-03-17: 500 [IU]
  Filled 2016-03-17: qty 5

## 2016-03-17 MED ORDER — SODIUM CHLORIDE 0.9 % IV SOLN
100.0000 mg/m2 | Freq: Once | INTRAVENOUS | Status: AC
  Administered 2016-03-17: 180 mg via INTRAVENOUS
  Filled 2016-03-17: qty 9

## 2016-03-17 NOTE — Progress Notes (Signed)
[place]  Telephone:(336) [phone] [phone]) [phone]  ID: Jill Gross OB: [birth year]  MR#: [id number]  ZDG#:[id number]  Patient Care Team: [name], NP as PCP - General (Nurse Practitioner)  CHIEF COMPLAINT: Small cell lung cancer. Chief Complaint  Patient presents with  . Lung Cancer    INTERVAL HISTORY: Patient returns to clinic today for reconsideration of cycle 3 of cisplatin and etoposide. Her weakness and fatigue as well as dizziness have essentially resolved. She no longer complains of nausea. She no longer has symptoms of a UTI. She currently feels well and nearly back to her baseline. She has no neurologic complaints. She has a fair appetite, but denies weight loss. She denies any chest pain, shortness of breath, hemoptysis, or cough. She denies any vomiting, constipation, or diarrhea. Patient offers no further specific complaints today.  REVIEW OF SYSTEMS:   Review of Systems  Constitutional: Positive for malaise/fatigue. Negative for fever and weight loss.  Respiratory: Negative for cough, hemoptysis and shortness of breath.   Cardiovascular: Negative.  Negative for chest pain.  Gastrointestinal: Negative for nausea.  Genitourinary: Negative.   Musculoskeletal: Negative.   Neurological: Positive for weakness. Negative for dizziness.  Psychiatric/Behavioral: The patient has insomnia.     As per HPI. Otherwise, a complete review of systems is negatve.  PAST MEDICAL HISTORY: Past Medical History  Diagnosis Date  . Anemia     pernicious  . Hypothyroid   . Osteopenia   . Insomnia   . Allergy   . COPD (chronic obstructive pulmonary disease) ([place])   . Depression   . Atrophic vaginitis   . Low back pain   . Fatigue   . Lumbago   . Tobacco use   . Hyperlipidemia   . Vitamin B12 deficiency   . Anxiety     PAST SURGICAL HISTORY: Past Surgical History  Procedure Laterality Date  . Cholecystectomy    . Abdominal hysterectomy    .  Hernia repair    . Ankle surgery      x4  . Carpal tunnel release  April 2015  . Endobronchial ultrasound N/A 01/07/2016    Procedure: ENDOBRONCHIAL ULTRASOUND;  Surgeon: Vilinda Boehringer, MD;  Location: [facility] ORS;  Service: Cardiopulmonary;  Laterality: N/A;  . Portacath placement Right 01/23/2016    Procedure: INSERTION PORT-A-CATH;  Surgeon: [name], MD;  Location: [facility] ORS;  Service: General;  Laterality: Right;    FAMILY HISTORY Family History  Problem Relation Age of Onset  . Emphysema Mother   . Diabetes Father        ADVANCED DIRECTIVES:    HEALTH MAINTENANCE: Social History  Substance Use Topics  . Smoking status: Former Smoker -- 1.00 packs/day    Types: Cigarettes  . Smokeless tobacco: Never Used  . Alcohol Use: No     Colonoscopy:  PAP:  Bone density:  Lipid panel:  Allergies  Allergen Reactions  . Skelaxin [Metaxalone] Anaphylaxis  . Effexor [Venlafaxine]   . Hctz [Hydrochlorothiazide] Other (See Comments)    cramps  . Macrobid [Nitrofurantoin]   . Neurontin [Gabapentin]   . Paxil [Paroxetine Hcl]   . Pravachol [Pravastatin Sodium] Other (See Comments)    aching  . Prozac [Fluoxetine Hcl]   . Sulfur   . Wellbutrin [Bupropion]   . Zoloft [Sertraline Hcl]     Current Outpatient Prescriptions  Medication Sig Dispense Refill  . albuterol (PROAIR HFA) 108 (90 Base) MCG/ACT inhaler Inhale 2 puffs into the lungs every 4 (four) hours as  needed for wheezing or shortness of breath. 8.5 g 1  . ALPRAZolam (XANAX) 0.25 MG tablet Take 1 tablet (0.25 mg total) by mouth 2 (two) times daily as needed for anxiety. 45 tablet 0  . amitriptyline (ELAVIL) 10 MG tablet Take by mouth. Take 2 tablets at bedtime    . cimetidine (TAGAMET) 200 MG tablet Take 150 mg by mouth at bedtime.     . cyanocobalamin (,VITAMIN B-12,) 1000 MCG/ML injection Inject 1 mL (1,000 mcg total) into the muscle once. 10 mL 1  . dexamethasone (DECADRON) 4 MG tablet Take 1 tablet (4 mg total) by  mouth 2 (two) times daily with a meal. Take 1 tablet 2x daily for 3 days, then begin 1 tablet daily until seen by Dr, Baruch Gouty. 15 tablet 0  . escitalopram (LEXAPRO) 20 MG tablet Take 20 mg by mouth at bedtime.     Marland Kitchen glucose blood test strip 1 each by Other route daily. Reported on 01/07/2016    . lansoprazole (PREVACID) 30 MG capsule Take 1 capsule (30 mg total) by mouth daily at 12 noon. 20 capsule 0  . levothyroxine (SYNTHROID, LEVOTHROID) 137 MCG tablet Take 137 mcg by mouth daily before breakfast.    . lidocaine-prilocaine (EMLA) cream Apply to affected area once 30 g 3  . Multiple Vitamin (MULTIVITAMIN) tablet Take 1 tablet by mouth daily.    . ondansetron (ZOFRAN) 8 MG tablet Take 1 tablet (8 mg total) by mouth 2 (two) times daily as needed. Start on the third day after cisplatin chemotherapy. 30 tablet 1  . oxyCODONE-acetaminophen (PERCOCET/ROXICET) 5-325 MG tablet Take 1 tablet by mouth every 4 (four) hours as needed for severe pain. 30 tablet 0  . potassium chloride (KLOR-CON) 20 MEQ packet Take 20 mEq by mouth 2 (two) times daily. 30 packet 2  . prochlorperazine (COMPAZINE) 10 MG tablet Take 1 tablet (10 mg total) by mouth every 6 (six) hours as needed (Nausea or vomiting). 30 tablet 1  . promethazine (PHENERGAN) 25 MG tablet Take 1 tablet (25 mg total) by mouth every 6 (six) hours as needed for nausea or vomiting. 30 tablet 0  . simvastatin (ZOCOR) 10 MG tablet Take 10 mg by mouth daily at 6 PM.     . sucralfate (CARAFATE) 1 g tablet Take 1 tablet (1 g total) by mouth 3 (three) times daily. 90 tablet 3  . zolpidem (AMBIEN) 10 MG tablet Take 1 tablet (10 mg total) by mouth at bedtime. 30 tablet 3  . Diphenhyd-Hydrocort-Nystatin (FIRST-DUKES MOUTHWASH) SUSP Use as directed 5 mLs in the mouth or throat 4 (four) times daily as needed. (Patient not taking: Reported on 03/17/2016) 1 Bottle 2  . magic mouthwash w/lidocaine SOLN Take 5 mLs by mouth 3 (three) times daily as needed for mouth pain.  (Patient not taking: Reported on 03/17/2016) 237 mL 2   No current facility-administered medications for this visit.    OBJECTIVE: Filed Vitals:   03/17/16 1018  BP: 125/85  Pulse: 90  Temp: 96.5 F (35.8 C)  Resp: 16     Body mass index is 27.16 kg/(m^2).    ECOG FS:1 - Symptomatic but completely ambulatory  General: Well-developed, well-nourished, no acute distress. Eyes: Pink conjunctiva, anicteric sclera. HEENT: Oropharynx clear without erythema or exudate. Lungs: Clear to auscultation bilaterally. Heart: Regular rate and rhythm. No rubs, murmurs, or gallops. Abdomen: Soft, nontender, nondistended. No organomegaly noted, normoactive bowel sounds. Musculoskeletal: No edema, cyanosis, or clubbing. Neuro: Alert, answering all questions appropriately. Cranial  nerves grossly intact. Skin: No rashes or petechiae noted. Psych: Normal affect.   LAB RESULTS:  Lab Results  Component Value Date   NA 130* 03/17/2016   K 3.8 03/17/2016   CL 95* 03/17/2016   CO2 29 03/17/2016   GLUCOSE 277* 03/17/2016   BUN 20 03/17/2016   CREATININE 0.90 03/17/2016   CALCIUM 8.5* 03/17/2016   PROT 6.8 03/17/2016   ALBUMIN 3.6 03/17/2016   AST 14* 03/17/2016   ALT 18 03/17/2016   ALKPHOS 67 03/17/2016   BILITOT 0.4 03/17/2016   GFRNONAA >60 03/17/2016   GFRAA >60 03/17/2016    Lab Results  Component Value Date   WBC 11.0 03/17/2016   NEUTROABS 9.2* 03/17/2016   HGB 11.0* 03/17/2016   HCT 31.4* 03/17/2016   MCV 95.1 03/17/2016   PLT 236 03/17/2016     STUDIES: No results found.  ASSESSMENT: Clinical stage IIa, T2 N1 M0 small cell lung cancer.  PLAN:    1. Small cell lung cancer: PET scan, MRI, and CT scans all reviewed independently. Biopsy consistent with small cell carcinoma of the lung. Proceed with cycle 3 of 4 of cisplatin and etoposide today. Continue daily XRT. Return to clinic in 1 and 2 days for etoposide only and then in 3 weeks for consideration of cycle 4 of  cisplatin and etoposide. She will be given treatment every 3 weeks and plan to reimage after cycle 4.  She will also require PCI at the conclusion of her treatments.  2. Hypotension: Improved, monitor. 3. Hyperglycemia: Patient receives dexamethasone as a premedication, monitor. 4. Insomnia: Has been ongoing since her mother passed in 2002. Continue current medications.  5. Nausea: Improved.  Continue Phenergan as prescribed. 6. Dysphagia: Continue Carafate as prescribed. Patient was also given a prescription for Dukes mouthwash. 7. UTI: Urinalysis is negative, a culture still growing Escherichia coli. Patient has been started to restart Cipro.   Patient expressed understanding and was in agreement with this plan. She also understands that She can call clinic at any time with any questions, concerns, or complaints.   Lloyd Huger, MD   03/17/2016 11:15 AM

## 2016-03-17 NOTE — Progress Notes (Signed)
Patient is swallowing much better and has been able to eat.  Still having dysuria and obtained a urine specimen at last visit.

## 2016-03-18 ENCOUNTER — Ambulatory Visit
Admission: RE | Admit: 2016-03-18 | Discharge: 2016-03-18 | Disposition: A | Discharge status: Home / Self Care | Payer: Managed Care, Other (non HMO) | Source: Ambulatory Visit | Attending: Radiation Oncology | Admitting: Radiation Oncology

## 2016-03-18 ENCOUNTER — Ambulatory Visit: Payer: Managed Care, Other (non HMO)

## 2016-03-18 ENCOUNTER — Inpatient Hospital Stay: Payer: Managed Care, Other (non HMO)

## 2016-03-18 VITALS — BP 134/85 | HR 94 | Temp 96.6°F | Resp 20

## 2016-03-18 DIAGNOSIS — C3490 Malignant neoplasm of unspecified part of unspecified bronchus or lung: Secondary | ICD-10-CM | POA: Diagnosis not present

## 2016-03-18 DIAGNOSIS — C3412 Malignant neoplasm of upper lobe, left bronchus or lung: Secondary | ICD-10-CM | POA: Diagnosis not present

## 2016-03-18 DIAGNOSIS — C3402 Malignant neoplasm of left main bronchus: Secondary | ICD-10-CM

## 2016-03-18 MED ORDER — SODIUM CHLORIDE 0.9 % IV SOLN
100.0000 mg/m2 | Freq: Once | INTRAVENOUS | Status: AC
  Administered 2016-03-18: 180 mg via INTRAVENOUS
  Filled 2016-03-18: qty 9

## 2016-03-18 MED ORDER — DEXAMETHASONE SODIUM PHOSPHATE 100 MG/10ML IJ SOLN
10.0000 mg | Freq: Once | INTRAMUSCULAR | Status: AC
  Administered 2016-03-18: 10 mg via INTRAVENOUS
  Filled 2016-03-18: qty 1

## 2016-03-18 MED ORDER — HEPARIN SOD (PORK) LOCK FLUSH 100 UNIT/ML IV SOLN
500.0000 [IU] | Freq: Once | INTRAVENOUS | Status: AC | PRN
Start: 2016-03-18 — End: 2016-03-18
  Administered 2016-03-18: 500 [IU]
  Filled 2016-03-18: qty 5

## 2016-03-18 MED ORDER — SODIUM CHLORIDE 0.9 % IV SOLN
Freq: Once | INTRAVENOUS | Status: AC
  Administered 2016-03-18: 15:00:00 via INTRAVENOUS
  Filled 2016-03-18: qty 1000

## 2016-03-18 MED ORDER — SODIUM CHLORIDE 0.9% FLUSH
10.0000 mL | INTRAVENOUS | Status: DC | PRN
Start: 2016-03-18 — End: 2016-03-18
  Filled 2016-03-18: qty 10

## 2016-03-19 ENCOUNTER — Inpatient Hospital Stay: Payer: Managed Care, Other (non HMO)

## 2016-03-19 ENCOUNTER — Ambulatory Visit
Admission: RE | Admit: 2016-03-19 | Discharge: 2016-03-19 | Disposition: A | Discharge status: Home / Self Care | Payer: Managed Care, Other (non HMO) | Source: Ambulatory Visit | Attending: Radiation Oncology | Admitting: Radiation Oncology

## 2016-03-19 VITALS — BP 132/83 | HR 92 | Temp 97.0°F | Resp 20

## 2016-03-19 DIAGNOSIS — M8580 Other specified disorders of bone density and structure, unspecified site: Secondary | ICD-10-CM

## 2016-03-19 DIAGNOSIS — E8881 Metabolic syndrome: Secondary | ICD-10-CM

## 2016-03-19 DIAGNOSIS — C3412 Malignant neoplasm of upper lobe, left bronchus or lung: Secondary | ICD-10-CM | POA: Diagnosis not present

## 2016-03-19 DIAGNOSIS — R5382 Chronic fatigue, unspecified: Secondary | ICD-10-CM

## 2016-03-19 DIAGNOSIS — F322 Major depressive disorder, single episode, severe without psychotic features: Secondary | ICD-10-CM

## 2016-03-19 DIAGNOSIS — C801 Malignant (primary) neoplasm, unspecified: Secondary | ICD-10-CM

## 2016-03-19 DIAGNOSIS — E039 Hypothyroidism, unspecified: Secondary | ICD-10-CM

## 2016-03-19 DIAGNOSIS — E785 Hyperlipidemia, unspecified: Secondary | ICD-10-CM

## 2016-03-19 DIAGNOSIS — N952 Postmenopausal atrophic vaginitis: Secondary | ICD-10-CM

## 2016-03-19 DIAGNOSIS — G8929 Other chronic pain: Secondary | ICD-10-CM

## 2016-03-19 DIAGNOSIS — M545 Low back pain: Secondary | ICD-10-CM

## 2016-03-19 DIAGNOSIS — J449 Chronic obstructive pulmonary disease, unspecified: Secondary | ICD-10-CM

## 2016-03-19 DIAGNOSIS — N3941 Urge incontinence: Secondary | ICD-10-CM

## 2016-03-19 DIAGNOSIS — F329 Major depressive disorder, single episode, unspecified: Secondary | ICD-10-CM

## 2016-03-19 DIAGNOSIS — Z720 Tobacco use: Secondary | ICD-10-CM

## 2016-03-19 DIAGNOSIS — E538 Deficiency of other specified B group vitamins: Secondary | ICD-10-CM

## 2016-03-19 DIAGNOSIS — D510 Vitamin B12 deficiency anemia due to intrinsic factor deficiency: Secondary | ICD-10-CM

## 2016-03-19 DIAGNOSIS — C3402 Malignant neoplasm of left main bronchus: Secondary | ICD-10-CM

## 2016-03-19 DIAGNOSIS — G4700 Insomnia, unspecified: Secondary | ICD-10-CM

## 2016-03-19 DIAGNOSIS — C3490 Malignant neoplasm of unspecified part of unspecified bronchus or lung: Secondary | ICD-10-CM | POA: Diagnosis not present

## 2016-03-19 DIAGNOSIS — F32A Depression, unspecified: Secondary | ICD-10-CM

## 2016-03-19 DIAGNOSIS — R918 Other nonspecific abnormal finding of lung field: Secondary | ICD-10-CM

## 2016-03-19 MED ORDER — SODIUM CHLORIDE 0.9 % IV SOLN
10.0000 mg | Freq: Once | INTRAVENOUS | Status: AC
  Administered 2016-03-19: 10 mg via INTRAVENOUS
  Filled 2016-03-19: qty 1

## 2016-03-19 MED ORDER — HEPARIN SOD (PORK) LOCK FLUSH 100 UNIT/ML IV SOLN
500.0000 [IU] | Freq: Once | INTRAVENOUS | Status: AC
  Administered 2016-03-19: 500 [IU] via INTRAVENOUS

## 2016-03-19 MED ORDER — SODIUM CHLORIDE 0.9% FLUSH
10.0000 mL | INTRAVENOUS | Status: DC | PRN
  Administered 2016-03-19: 10 mL via INTRAVENOUS
  Filled 2016-03-19: qty 10

## 2016-03-19 MED ORDER — HEPARIN SOD (PORK) LOCK FLUSH 100 UNIT/ML IV SOLN: 500.0000 [IU] | Freq: Once | INTRAVENOUS | Status: AC

## 2016-03-19 MED ORDER — SODIUM CHLORIDE 0.9 % IV SOLN
100.0000 mg/m2 | Freq: Once | INTRAVENOUS | Status: AC
  Administered 2016-03-19: 180 mg via INTRAVENOUS
  Filled 2016-03-19: qty 9

## 2016-03-19 MED ORDER — SODIUM CHLORIDE 0.9 % IV SOLN
Freq: Once | INTRAVENOUS | Status: AC
  Administered 2016-03-19: 12:00:00 via INTRAVENOUS
  Filled 2016-03-19: qty 1000

## 2016-03-19 MED ORDER — SODIUM CHLORIDE 0.9% FLUSH
10.0000 mL | INTRAVENOUS | Status: DC | PRN
  Filled 2016-03-19: qty 10

## 2016-03-20 ENCOUNTER — Ambulatory Visit
Admission: RE | Admit: 2016-03-20 | Discharge: 2016-03-20 | Disposition: A | Discharge status: Home / Self Care | Payer: Managed Care, Other (non HMO) | Source: Ambulatory Visit | Attending: Radiation Oncology | Admitting: Radiation Oncology

## 2016-03-20 ENCOUNTER — Ambulatory Visit: Payer: Managed Care, Other (non HMO)

## 2016-03-20 DIAGNOSIS — C3412 Malignant neoplasm of upper lobe, left bronchus or lung: Secondary | ICD-10-CM | POA: Diagnosis not present

## 2016-03-21 ENCOUNTER — Ambulatory Visit: Payer: Managed Care, Other (non HMO)

## 2016-03-21 ENCOUNTER — Ambulatory Visit
Admission: RE | Admit: 2016-03-21 | Discharge: 2016-03-21 | Disposition: A | Discharge status: Home / Self Care | Payer: Managed Care, Other (non HMO) | Source: Ambulatory Visit | Attending: Radiation Oncology | Admitting: Radiation Oncology

## 2016-03-21 DIAGNOSIS — C3412 Malignant neoplasm of upper lobe, left bronchus or lung: Secondary | ICD-10-CM | POA: Diagnosis not present

## 2016-03-24 ENCOUNTER — Ambulatory Visit: Payer: Managed Care, Other (non HMO)

## 2016-03-24 ENCOUNTER — Telehealth: Payer: Self-pay | Admitting: *Deleted

## 2016-03-24 ENCOUNTER — Ambulatory Visit
Admission: RE | Admit: 2016-03-24 | Discharge: 2016-03-24 | Disposition: A | Discharge status: Home / Self Care | Payer: Managed Care, Other (non HMO) | Source: Ambulatory Visit | Attending: Radiation Oncology | Admitting: Radiation Oncology

## 2016-03-24 ENCOUNTER — Ambulatory Visit: Admission: RE | Admit: 2016-03-24 | Payer: Managed Care, Other (non HMO) | Source: Ambulatory Visit

## 2016-03-24 ENCOUNTER — Other Ambulatory Visit: Payer: Self-pay | Admitting: *Deleted

## 2016-03-24 ENCOUNTER — Inpatient Hospital Stay: Payer: Managed Care, Other (non HMO)

## 2016-03-24 DIAGNOSIS — R918 Other nonspecific abnormal finding of lung field: Secondary | ICD-10-CM

## 2016-03-24 DIAGNOSIS — C3492 Malignant neoplasm of unspecified part of left bronchus or lung: Secondary | ICD-10-CM

## 2016-03-24 DIAGNOSIS — C3490 Malignant neoplasm of unspecified part of unspecified bronchus or lung: Secondary | ICD-10-CM | POA: Diagnosis not present

## 2016-03-24 DIAGNOSIS — C3432 Malignant neoplasm of lower lobe, left bronchus or lung: Secondary | ICD-10-CM

## 2016-03-24 LAB — BASIC METABOLIC PANEL
ANION GAP: 7 (ref 5–15)
BUN: 19 mg/dL (ref 6–20)
CHLORIDE: 98 mmol/L — AB (ref 101–111)
CO2: 25 mmol/L (ref 22–32)
Calcium: 8.6 mg/dL — ABNORMAL LOW (ref 8.9–10.3)
Creatinine, Ser: 0.93 mg/dL (ref 0.44–1.00)
GFR calc Af Amer: >60 mL/min (ref >60)
GLUCOSE: 192 mg/dL — AB (ref 65–99)
POTASSIUM: 4.3 mmol/L (ref 3.5–5.1)
Sodium: 130 mmol/L — ABNORMAL LOW (ref 135–145)

## 2016-03-24 LAB — CBC WITH DIFFERENTIAL/PLATELET
BASOS ABS: 0 10*3/uL (ref 0–0.1)
Basophils Relative: 0 %
Eosinophils Absolute: 0.1 10*3/uL (ref 0–0.7)
Eosinophils Relative: 2 %
HEMATOCRIT: 30.2 % — AB (ref 35.0–47.0)
HEMOGLOBIN: 10.4 g/dL — AB (ref 12.0–16.0)
LYMPHS PCT: 10 %
Lymphs Abs: 0.5 10*3/uL — ABNORMAL LOW (ref 1.0–3.6)
MCH: 32.9 pg (ref 26.0–34.0)
MCHC: 34.4 g/dL (ref 32.0–36.0)
MCV: 95.6 fL (ref 80.0–100.0)
Monocytes Absolute: 0 10*3/uL — ABNORMAL LOW (ref 0.2–0.9)
Monocytes Relative: 1 %
NEUTROS ABS: 4.6 10*3/uL (ref 1.4–6.5)
Neutrophils Relative %: 87 %
PLATELETS: 107 10*3/uL — AB (ref 150–440)
RBC: 3.16 MIL/uL — AB (ref 3.80–5.20)
RDW: 18.8 % — ABNORMAL HIGH (ref 11.5–14.5)
WBC: 5.2 10*3/uL (ref 3.6–11.0)

## 2016-03-24 MED ORDER — HEPARIN SOD (PORK) LOCK FLUSH 100 UNIT/ML IV SOLN: 500.0000 [IU] | Freq: Once | INTRAVENOUS | Status: DC

## 2016-03-24 MED ORDER — SODIUM CHLORIDE 0.9 % IV SOLN
Freq: Once | INTRAVENOUS | Status: AC
  Administered 2016-03-24: 13:00:00 via INTRAVENOUS
  Filled 2016-03-24: qty 1000

## 2016-03-24 MED ORDER — SODIUM CHLORIDE 0.9% FLUSH
10.0000 mL | Freq: Once | INTRAVENOUS | Status: DC
  Filled 2016-03-24: qty 10

## 2016-03-24 NOTE — Telephone Encounter (Signed)
Called to ask for IVF, has not felt well all weekend and was told to call for IVF today

## 2016-03-24 NOTE — Telephone Encounter (Signed)
Er Dr Dr [name] come in for labs and IVF, she agrees to 11AM appt

## 2016-03-25 ENCOUNTER — Ambulatory Visit: Payer: Managed Care, Other (non HMO)

## 2016-03-26 ENCOUNTER — Ambulatory Visit: Payer: Managed Care, Other (non HMO)

## 2016-03-26 ENCOUNTER — Ambulatory Visit
Admission: RE | Admit: 2016-03-26 | Discharge: 2016-03-26 | Disposition: A | Discharge status: Home / Self Care | Payer: Managed Care, Other (non HMO) | Source: Ambulatory Visit | Attending: Radiation Oncology | Admitting: Radiation Oncology

## 2016-03-26 DIAGNOSIS — C3412 Malignant neoplasm of upper lobe, left bronchus or lung: Secondary | ICD-10-CM | POA: Diagnosis not present

## 2016-03-27 ENCOUNTER — Ambulatory Visit: Payer: Managed Care, Other (non HMO)

## 2016-03-27 ENCOUNTER — Ambulatory Visit
Admission: RE | Admit: 2016-03-27 | Discharge: 2016-03-27 | Disposition: A | Discharge status: Home / Self Care | Payer: Managed Care, Other (non HMO) | Source: Ambulatory Visit | Attending: Radiation Oncology | Admitting: Radiation Oncology

## 2016-03-27 DIAGNOSIS — C3412 Malignant neoplasm of upper lobe, left bronchus or lung: Secondary | ICD-10-CM | POA: Diagnosis not present

## 2016-03-28 ENCOUNTER — Ambulatory Visit
Admission: RE | Admit: 2016-03-28 | Discharge: 2016-03-28 | Disposition: A | Discharge status: Home / Self Care | Payer: Managed Care, Other (non HMO) | Source: Ambulatory Visit | Attending: Radiation Oncology | Admitting: Radiation Oncology

## 2016-03-28 ENCOUNTER — Ambulatory Visit: Payer: Managed Care, Other (non HMO)

## 2016-03-28 DIAGNOSIS — C3412 Malignant neoplasm of upper lobe, left bronchus or lung: Secondary | ICD-10-CM | POA: Diagnosis not present

## 2016-04-01 ENCOUNTER — Inpatient Hospital Stay: Payer: Managed Care, Other (non HMO)

## 2016-04-01 ENCOUNTER — Other Ambulatory Visit: Payer: Self-pay | Admitting: *Deleted

## 2016-04-01 ENCOUNTER — Telehealth: Payer: Self-pay | Admitting: *Deleted

## 2016-04-01 ENCOUNTER — Other Ambulatory Visit: Payer: Self-pay | Admitting: Oncology

## 2016-04-01 VITALS — BP 117/70 | HR 95 | Temp 98.0°F | Resp 20

## 2016-04-01 DIAGNOSIS — C3432 Malignant neoplasm of lower lobe, left bronchus or lung: Secondary | ICD-10-CM

## 2016-04-01 DIAGNOSIS — C3490 Malignant neoplasm of unspecified part of unspecified bronchus or lung: Secondary | ICD-10-CM | POA: Diagnosis not present

## 2016-04-01 MED ORDER — HEPARIN SOD (PORK) LOCK FLUSH 100 UNIT/ML IV SOLN
500.0000 [IU] | Freq: Once | INTRAVENOUS | Status: AC
  Administered 2016-04-01: 500 [IU] via INTRAVENOUS
  Filled 2016-04-01: qty 5

## 2016-04-01 MED ORDER — DEXAMETHASONE 4 MG PO TABS: 4.0000 mg | ORAL_TABLET | Freq: Every day | ORAL | Status: DC

## 2016-04-01 MED ORDER — SODIUM CHLORIDE 0.9 % IV SOLN
Freq: Once | INTRAVENOUS | Status: DC
  Filled 2016-04-01: qty 1000

## 2016-04-01 MED ORDER — SODIUM CHLORIDE 0.9 % IV SOLN
Freq: Once | INTRAVENOUS | Status: AC
  Administered 2016-04-01: 15:00:00 via INTRAVENOUS
  Filled 2016-04-01: qty 1000

## 2016-04-01 MED ORDER — SODIUM CHLORIDE 0.9 % IV SOLN
Freq: Once | INTRAVENOUS | Status: AC
  Administered 2016-04-01: 15:00:00 via INTRAVENOUS
  Filled 2016-04-01: qty 4

## 2016-04-01 MED ORDER — SODIUM CHLORIDE 0.9 % IV SOLN
10.0000 mg | Freq: Once | INTRAVENOUS | Status: DC
  Filled 2016-04-01: qty 1

## 2016-04-01 MED ORDER — SODIUM CHLORIDE 0.9% FLUSH
10.0000 mL | INTRAVENOUS | Status: DC | PRN
  Filled 2016-04-01: qty 10

## 2016-04-01 MED ORDER — SODIUM CHLORIDE 0.9 % IV SOLN
10.0000 mg | Freq: Once | INTRAVENOUS | Status: AC
  Administered 2016-04-01: 10 mg via INTRAVENOUS
  Filled 2016-04-01: qty 1

## 2016-04-01 NOTE — Telephone Encounter (Signed)
Called to request IVF and something extra that was previously given that made her feel better than just IVF. Denies nausea or vomiting, just having difficulty swallowing and eating. Appt for this afternoon at 2:45 for IVF agreed on in the [facility] office

## 2016-04-04 ENCOUNTER — Other Ambulatory Visit: Payer: Self-pay | Admitting: Unknown Physician Specialty

## 2016-04-07 ENCOUNTER — Inpatient Hospital Stay (HOSPITAL_BASED_OUTPATIENT_CLINIC_OR_DEPARTMENT_OTHER): Payer: Managed Care, Other (non HMO) | Admitting: Oncology

## 2016-04-07 ENCOUNTER — Inpatient Hospital Stay: Payer: Managed Care, Other (non HMO) | Attending: Oncology

## 2016-04-07 ENCOUNTER — Inpatient Hospital Stay: Payer: Managed Care, Other (non HMO)

## 2016-04-07 VITALS — BP 87/58 | HR 116 | Temp 96.4°F | Resp 16 | Wt 151.9 lb

## 2016-04-07 VITALS — BP 120/76 | HR 89 | Temp 97.1°F | Resp 18

## 2016-04-07 DIAGNOSIS — E785 Hyperlipidemia, unspecified: Secondary | ICD-10-CM | POA: Diagnosis not present

## 2016-04-07 DIAGNOSIS — D6481 Anemia due to antineoplastic chemotherapy: Secondary | ICD-10-CM

## 2016-04-07 DIAGNOSIS — N952 Postmenopausal atrophic vaginitis: Secondary | ICD-10-CM | POA: Insufficient documentation

## 2016-04-07 DIAGNOSIS — F419 Anxiety disorder, unspecified: Secondary | ICD-10-CM | POA: Insufficient documentation

## 2016-04-07 DIAGNOSIS — M545 Low back pain: Secondary | ICD-10-CM | POA: Diagnosis not present

## 2016-04-07 DIAGNOSIS — D696 Thrombocytopenia, unspecified: Secondary | ICD-10-CM | POA: Insufficient documentation

## 2016-04-07 DIAGNOSIS — Z87891 Personal history of nicotine dependence: Secondary | ICD-10-CM | POA: Insufficient documentation

## 2016-04-07 DIAGNOSIS — E039 Hypothyroidism, unspecified: Secondary | ICD-10-CM

## 2016-04-07 DIAGNOSIS — E876 Hypokalemia: Secondary | ICD-10-CM | POA: Insufficient documentation

## 2016-04-07 DIAGNOSIS — R531 Weakness: Secondary | ICD-10-CM | POA: Diagnosis not present

## 2016-04-07 DIAGNOSIS — J449 Chronic obstructive pulmonary disease, unspecified: Secondary | ICD-10-CM | POA: Diagnosis not present

## 2016-04-07 DIAGNOSIS — R739 Hyperglycemia, unspecified: Secondary | ICD-10-CM | POA: Insufficient documentation

## 2016-04-07 DIAGNOSIS — C3432 Malignant neoplasm of lower lobe, left bronchus or lung: Secondary | ICD-10-CM

## 2016-04-07 DIAGNOSIS — R1310 Dysphagia, unspecified: Secondary | ICD-10-CM | POA: Insufficient documentation

## 2016-04-07 DIAGNOSIS — R110 Nausea: Secondary | ICD-10-CM | POA: Diagnosis not present

## 2016-04-07 DIAGNOSIS — E538 Deficiency of other specified B group vitamins: Secondary | ICD-10-CM | POA: Insufficient documentation

## 2016-04-07 DIAGNOSIS — D649 Anemia, unspecified: Secondary | ICD-10-CM

## 2016-04-07 DIAGNOSIS — Z9221 Personal history of antineoplastic chemotherapy: Secondary | ICD-10-CM

## 2016-04-07 DIAGNOSIS — R42 Dizziness and giddiness: Secondary | ICD-10-CM | POA: Insufficient documentation

## 2016-04-07 DIAGNOSIS — Z5111 Encounter for antineoplastic chemotherapy: Secondary | ICD-10-CM | POA: Diagnosis not present

## 2016-04-07 DIAGNOSIS — M818 Other osteoporosis without current pathological fracture: Secondary | ICD-10-CM | POA: Insufficient documentation

## 2016-04-07 DIAGNOSIS — I959 Hypotension, unspecified: Secondary | ICD-10-CM | POA: Insufficient documentation

## 2016-04-07 DIAGNOSIS — N390 Urinary tract infection, site not specified: Secondary | ICD-10-CM

## 2016-04-07 DIAGNOSIS — D510 Vitamin B12 deficiency anemia due to intrinsic factor deficiency: Secondary | ICD-10-CM | POA: Diagnosis not present

## 2016-04-07 DIAGNOSIS — C3490 Malignant neoplasm of unspecified part of unspecified bronchus or lung: Secondary | ICD-10-CM | POA: Diagnosis not present

## 2016-04-07 DIAGNOSIS — R5383 Other fatigue: Secondary | ICD-10-CM

## 2016-04-07 DIAGNOSIS — R202 Paresthesia of skin: Secondary | ICD-10-CM | POA: Insufficient documentation

## 2016-04-07 DIAGNOSIS — R630 Anorexia: Secondary | ICD-10-CM | POA: Diagnosis not present

## 2016-04-07 DIAGNOSIS — G4700 Insomnia, unspecified: Secondary | ICD-10-CM | POA: Insufficient documentation

## 2016-04-07 DIAGNOSIS — C3402 Malignant neoplasm of left main bronchus: Secondary | ICD-10-CM

## 2016-04-07 HISTORY — DX: Personal history of antineoplastic chemotherapy: Z92.21

## 2016-04-07 LAB — COMPREHENSIVE METABOLIC PANEL
ALT: 19 U/L (ref 14–54)
AST: 20 U/L (ref 15–41)
Albumin: 3.8 g/dL (ref 3.5–5.0)
Alkaline Phosphatase: 85 U/L (ref 38–126)
Anion gap: 3 — ABNORMAL LOW (ref 5–15)
BUN: 12 mg/dL (ref 6–20)
CALCIUM: 8.9 mg/dL (ref 8.9–10.3)
CHLORIDE: 97 mmol/L — AB (ref 101–111)
CO2: 36 mmol/L — ABNORMAL HIGH (ref 22–32)
CREATININE: 0.8 mg/dL (ref 0.44–1.00)
Glucose, Bld: 190 mg/dL — ABNORMAL HIGH (ref 65–99)
Potassium: 3 mmol/L — ABNORMAL LOW (ref 3.5–5.1)
Sodium: 136 mmol/L (ref 135–145)
Total Bilirubin: 0.6 mg/dL (ref 0.3–1.2)
Total Protein: 7.2 g/dL (ref 6.5–8.1)

## 2016-04-07 LAB — URINALYSIS COMPLETE WITH MICROSCOPIC (ARMC ONLY)
Bilirubin Urine: NEGATIVE
Glucose, UA: NEGATIVE mg/dL
Ketones, ur: NEGATIVE mg/dL
NITRITE: NEGATIVE
PH: 5 (ref 5.0–8.0)
PROTEIN: 30 mg/dL — AB
Specific Gravity, Urine: 1.018 (ref 1.005–1.030)

## 2016-04-07 LAB — CBC WITH DIFFERENTIAL/PLATELET
Basophils Absolute: 0 10*3/uL (ref 0–0.1)
Basophils Relative: 0 %
EOS ABS: 0 10*3/uL (ref 0–0.7)
HCT: 23.4 % — ABNORMAL LOW (ref 35.0–47.0)
Hemoglobin: 8.5 g/dL — ABNORMAL LOW (ref 12.0–16.0)
LYMPHS ABS: 0.8 10*3/uL — AB (ref 1.0–3.6)
Lymphocytes Relative: 20 %
MCH: 34.9 pg — AB (ref 26.0–34.0)
MCHC: 36.3 g/dL — ABNORMAL HIGH (ref 32.0–36.0)
MCV: 96.1 fL (ref 80.0–100.0)
MONO ABS: 0.4 10*3/uL (ref 0.2–0.9)
Neutro Abs: 2.7 10*3/uL (ref 1.4–6.5)
PLATELETS: 108 10*3/uL — AB (ref 150–440)
RBC: 2.44 MIL/uL — ABNORMAL LOW (ref 3.80–5.20)
RDW: 20.8 % — ABNORMAL HIGH (ref 11.5–14.5)
WBC: 3.9 10*3/uL (ref 3.6–11.0)

## 2016-04-07 LAB — ABO/RH: ABO/RH(D): A NEG

## 2016-04-07 LAB — PREPARE RBC (CROSSMATCH)

## 2016-04-07 MED ORDER — DIPHENHYDRAMINE HCL 50 MG/ML IJ SOLN: 25.0000 mg | Freq: Once | INTRAMUSCULAR | Status: DC

## 2016-04-07 MED ORDER — DIPHENHYDRAMINE HCL 50 MG/ML IJ SOLN
25.0000 mg | Freq: Once | INTRAMUSCULAR | Status: AC
  Administered 2016-04-07: 25 mg via INTRAVENOUS
  Filled 2016-04-07: qty 1

## 2016-04-07 MED ORDER — SODIUM CHLORIDE 0.9% FLUSH
10.0000 mL | INTRAVENOUS | Status: DC | PRN
  Administered 2016-04-07: 10 mL via INTRAVENOUS
  Filled 2016-04-07: qty 10

## 2016-04-07 MED ORDER — HEPARIN SOD (PORK) LOCK FLUSH 100 UNIT/ML IV SOLN
500.0000 [IU] | Freq: Every day | INTRAVENOUS | Status: AC | PRN
  Administered 2016-04-07: 500 [IU]
  Filled 2016-04-07: qty 5

## 2016-04-07 MED ORDER — SODIUM CHLORIDE 0.9 % IV SOLN
Freq: Once | INTRAVENOUS | Status: AC
  Administered 2016-04-07: 10:00:00 via INTRAVENOUS
  Filled 2016-04-07: qty 1000

## 2016-04-07 MED ORDER — LEVOFLOXACIN IN D5W 500 MG/100ML IV SOLN
500.0000 mg | Freq: Once | INTRAVENOUS | Status: AC
  Administered 2016-04-07: 500 mg via INTRAVENOUS
  Filled 2016-04-07: qty 100

## 2016-04-07 MED ORDER — DEXAMETHASONE SODIUM PHOSPHATE 100 MG/10ML IJ SOLN
Freq: Once | INTRAMUSCULAR | Status: AC
  Administered 2016-04-07: 12:00:00 via INTRAVENOUS
  Filled 2016-04-07: qty 4

## 2016-04-07 MED ORDER — SODIUM CHLORIDE 0.9 % IV SOLN: Freq: Once | INTRAVENOUS | Status: DC

## 2016-04-07 MED ORDER — SODIUM CHLORIDE 0.9 % IV SOLN
Freq: Once | INTRAVENOUS | Status: AC
  Administered 2016-04-07: 13:00:00 via INTRAVENOUS
  Filled 2016-04-07: qty 250

## 2016-04-07 MED ORDER — SODIUM CHLORIDE 0.9 % IV SOLN: 250.0000 mL | Freq: Once | INTRAVENOUS | Status: DC

## 2016-04-07 MED ORDER — ACETAMINOPHEN 325 MG PO TABS
650.0000 mg | ORAL_TABLET | Freq: Once | ORAL | Status: AC
  Administered 2016-04-07: 650 mg via ORAL
  Filled 2016-04-07: qty 2

## 2016-04-07 MED ORDER — LEVOFLOXACIN 500 MG PO TABS: 500.0000 mg | ORAL_TABLET | Freq: Every day | ORAL | Status: DC

## 2016-04-07 NOTE — Progress Notes (Signed)
Patient is feeling dizzy and weak, her bp is 85/58 today.  Has a lot of throat pain when swallowing so Dr. Baruch Gouty prescribed Carafate and advised her NOT to use Magic Mouthwash.

## 2016-04-08 ENCOUNTER — Inpatient Hospital Stay: Payer: Managed Care, Other (non HMO)

## 2016-04-08 LAB — TYPE AND SCREEN
ABO/RH(D): A NEG
ANTIBODY SCREEN: NEGATIVE
Unit division: 0

## 2016-04-09 ENCOUNTER — Inpatient Hospital Stay: Payer: Managed Care, Other (non HMO)

## 2016-04-09 LAB — URINE CULTURE: Culture: >=100000 — AB

## 2016-04-12 NOTE — Progress Notes (Signed)
[place]  [phone] [phone]  ID: Jill Gross OB: January 02, 1956  MR#: [id number]  ELF#:[id number]  Patient Care Team: [name], NP as PCP - General (Nurse Practitioner)  CHIEF COMPLAINT: Small cell lung cancer. Chief Complaint  Patient presents with  . Lung Cancer    INTERVAL HISTORY: Patient returns to clinic today for consideration of cycle 4 of cisplatin and etoposide. She again feels weak and fatigued as well as dizzy. She has poor PO intake associated with dysphasia.  She has no neurologic complaints. She denies any recent fevers.  She denies any chest pain, shortness of breath, hemoptysis, or cough. She denies any vomiting, constipation, or diarrhea. Patient offers no further specific complaints today.  REVIEW OF SYSTEMS:   Review of Systems  Constitutional: Positive for malaise/fatigue. Negative for fever and weight loss.  HENT: Positive for sore throat.   Respiratory: Negative for cough, hemoptysis and shortness of breath.   Cardiovascular: Negative.  Negative for chest pain.  Gastrointestinal: Negative for nausea.  Genitourinary: Negative.   Musculoskeletal: Negative.   Neurological: Positive for dizziness and weakness.  Psychiatric/Behavioral: The patient has insomnia.     As per HPI. Otherwise, a complete review of systems is negatve.  PAST MEDICAL HISTORY: Past Medical History  Diagnosis Date  . Anemia     pernicious  . Hypothyroid   . Osteopenia   . Insomnia   . Allergy   . COPD (chronic obstructive pulmonary disease) ([place])   . Depression   . Atrophic vaginitis   . Low back pain   . Fatigue   . Lumbago   . Tobacco use   . Hyperlipidemia   . Vitamin B12 deficiency   . Anxiety     PAST SURGICAL HISTORY: Past Surgical History  Procedure Laterality Date  . Cholecystectomy    . Abdominal hysterectomy    . Hernia repair    . Ankle surgery      x4  . Carpal tunnel release  April 2015  . Endobronchial  ultrasound N/A 01/07/2016    Procedure: ENDOBRONCHIAL ULTRASOUND;  Surgeon: Vilinda Boehringer, MD;  Location: [facility] ORS;  Service: Cardiopulmonary;  Laterality: N/A;  . Portacath placement Right 01/23/2016    Procedure: INSERTION PORT-A-CATH;  Surgeon: [name], MD;  Location: [facility] ORS;  Service: General;  Laterality: Right;    FAMILY HISTORY Family History  Problem Relation Age of Onset  . Emphysema Mother   . Diabetes Father        ADVANCED DIRECTIVES:    HEALTH MAINTENANCE: Social History  Substance Use Topics  . Smoking status: Former Smoker -- 1.00 packs/day    Types: Cigarettes  . Smokeless tobacco: Never Used  . Alcohol Use: No     Colonoscopy:  PAP:  Bone density:  Lipid panel:  Allergies  Allergen Reactions  . Skelaxin [Metaxalone] Anaphylaxis  . Effexor [Venlafaxine]   . Hctz [Hydrochlorothiazide] Other (See Comments)    cramps  . Macrobid [Nitrofurantoin]   . Neurontin [Gabapentin]   . Paxil [Paroxetine Hcl]   . Pravachol [Pravastatin Sodium] Other (See Comments)    aching  . Prozac [Fluoxetine Hcl]   . Sulfur   . Wellbutrin [Bupropion]   . Zoloft [Sertraline Hcl]     Current Outpatient Prescriptions  Medication Sig Dispense Refill  . albuterol (PROAIR HFA) 108 (90 Base) MCG/ACT inhaler Inhale 2 puffs into the lungs every 4 (four) hours as needed for wheezing or shortness of breath. 8.5 g 1  . ALPRAZolam (  XANAX) 0.25 MG tablet Take 1 tablet (0.25 mg total) by mouth 2 (two) times daily as needed for anxiety. 45 tablet 0  . amitriptyline (ELAVIL) 10 MG tablet Take 1 tablet (10 mg total) by mouth at bedtime. 60 tablet 12  . cimetidine (TAGAMET) 200 MG tablet Take 150 mg by mouth at bedtime.     . cyanocobalamin (,VITAMIN B-12,) 1000 MCG/ML injection Inject 1 mL (1,000 mcg total) into the muscle once. 10 mL 1  . dexamethasone (DECADRON) 4 MG tablet Take 1 tablet (4 mg total) by mouth daily. Take 1 tablet daily for 7 days, then 1 tablet every other day until  finished. 12 tablet 0  . escitalopram (LEXAPRO) 20 MG tablet Take 1 tablet (20 mg total) by mouth daily. 30 tablet 6  . glucose blood test strip 1 each by Other route daily. Reported on 01/07/2016    . lansoprazole (PREVACID) 30 MG capsule Take 1 capsule (30 mg total) by mouth daily at 12 noon. 20 capsule 0  . levothyroxine (SYNTHROID, LEVOTHROID) 137 MCG tablet Take 137 mcg by mouth daily before breakfast.    . lidocaine-prilocaine (EMLA) cream Apply to affected area once 30 g 3  . Multiple Vitamin (MULTIVITAMIN) tablet Take 1 tablet by mouth daily.    . ondansetron (ZOFRAN) 8 MG tablet Take 1 tablet (8 mg total) by mouth 2 (two) times daily as needed. Start on the third day after cisplatin chemotherapy. 30 tablet 1  . oxyCODONE-acetaminophen (PERCOCET/ROXICET) 5-325 MG tablet Take 1 tablet by mouth every 4 (four) hours as needed for severe pain. 30 tablet 0  . potassium chloride (KLOR-CON) 20 MEQ packet Take 20 mEq by mouth 2 (two) times daily. 30 packet 2  . prochlorperazine (COMPAZINE) 10 MG tablet Take 1 tablet (10 mg total) by mouth every 6 (six) hours as needed (Nausea or vomiting). 30 tablet 1  . promethazine (PHENERGAN) 25 MG tablet Take 1 tablet (25 mg total) by mouth every 6 (six) hours as needed for nausea or vomiting. 30 tablet 0  . simvastatin (ZOCOR) 10 MG tablet Take 1 tablet (10 mg total) by mouth daily at 6 PM. 30 tablet 6  . sucralfate (CARAFATE) 1 g tablet Take 1 tablet (1 g total) by mouth 3 (three) times daily. 90 tablet 3  . zolpidem (AMBIEN) 10 MG tablet Take 1 tablet (10 mg total) by mouth at bedtime. 30 tablet 3  . Diphenhyd-Hydrocort-Nystatin (FIRST-DUKES MOUTHWASH) SUSP Use as directed 5 mLs in the mouth or throat 4 (four) times daily as needed. (Patient not taking: Reported on 03/17/2016) 1 Bottle 2  . levofloxacin (LEVAQUIN) 500 MG tablet Take 1 tablet (500 mg total) by mouth daily. 10 tablet 0  . magic mouthwash w/lidocaine SOLN Take 5 mLs by mouth 3 (three) times daily  as needed for mouth pain. (Patient not taking: Reported on 03/17/2016) 237 mL 2   No current facility-administered medications for this visit.    OBJECTIVE: Filed Vitals:   04/07/16 0858  BP: 87/58  Pulse: 116  Temp: 96.4 F (35.8 C)  Resp: 16     Body mass index is 26.06 kg/(m^2).    ECOG FS:1 - Symptomatic but completely ambulatory  General: Well-developed, well-nourished, no acute distress. Eyes: Pink conjunctiva, anicteric sclera. HEENT: Oropharynx clear without erythema or exudate. Lungs: Clear to auscultation bilaterally. Heart: Regular rate and rhythm. No rubs, murmurs, or gallops. Abdomen: Soft, nontender, nondistended. No organomegaly noted, normoactive bowel sounds. Musculoskeletal: No edema, cyanosis, or clubbing. Neuro: Alert,  answering all questions appropriately. Cranial nerves grossly intact. Skin: No rashes or petechiae noted. Psych: Normal affect.   LAB RESULTS:  Lab Results  Component Value Date   NA 136 04/07/2016   K 3.0* 04/07/2016   CL 97* 04/07/2016   CO2 36* 04/07/2016   GLUCOSE 190* 04/07/2016   BUN 12 04/07/2016   CREATININE 0.80 04/07/2016   CALCIUM 8.9 04/07/2016   PROT 7.2 04/07/2016   ALBUMIN 3.8 04/07/2016   AST 20 04/07/2016   ALT 19 04/07/2016   ALKPHOS 85 04/07/2016   BILITOT 0.6 04/07/2016   GFRNONAA >60 04/07/2016   GFRAA >60 04/07/2016    Lab Results  Component Value Date   WBC 3.9 04/07/2016   NEUTROABS 2.7 04/07/2016   HGB 8.5* 04/07/2016   HCT 23.4* 04/07/2016   MCV 96.1 04/07/2016   PLT 108* 04/07/2016     STUDIES: No results found.  ASSESSMENT: Clinical stage IIa, T2 N1 M0 small cell lung cancer.  PLAN:    1. Small cell lung cancer: PET scan, MRI, and CT scans all reviewed independently. Biopsy consistent with small cell carcinoma of the lung. Delay cycle 4 of 4 of cisplatin and etoposide today. Patient has now completed her XRT. Return to clinic in 1 week for reconsideration of cycle 4 of cisplatin and  etoposide. Plan to reimage after cycle 4.  She will also require PCI at the conclusion of her treatments.  2. Hypotension/dizziness: Delay treatment as above and patient will be given one unit packed red blood cells today. 3. Hyperglycemia: Patient receives dexamethasone as a premedication, monitor. 4. Insomnia: Has been ongoing since her mother passed in 2002. Continue current medications.  5. Nausea:  Continue Phenergan as prescribed. 6. Dysphagia: Continue Carafate as prescribed. Patient was also given a prescription for Dukes mouthwash. 7. Anemia: Secondary chemotherapy, but transfusion as above. 8. Thrombocytopenia: Mild, secondary to chemotherapy. Monitor.  Patient expressed understanding and was in agreement with this plan. She also understands that She can call clinic at any time with any questions, concerns, or complaints.   Lloyd Huger, MD   04/12/2016 8:21 AM

## 2016-04-14 ENCOUNTER — Inpatient Hospital Stay: Payer: Managed Care, Other (non HMO)

## 2016-04-14 ENCOUNTER — Inpatient Hospital Stay (HOSPITAL_BASED_OUTPATIENT_CLINIC_OR_DEPARTMENT_OTHER): Payer: Managed Care, Other (non HMO) | Admitting: Oncology

## 2016-04-14 VITALS — BP 125/79 | HR 90

## 2016-04-14 VITALS — BP 94/67 | HR 112 | Temp 97.9°F | Wt 157.8 lb

## 2016-04-14 DIAGNOSIS — C3490 Malignant neoplasm of unspecified part of unspecified bronchus or lung: Secondary | ICD-10-CM

## 2016-04-14 DIAGNOSIS — D510 Vitamin B12 deficiency anemia due to intrinsic factor deficiency: Secondary | ICD-10-CM

## 2016-04-14 DIAGNOSIS — C3402 Malignant neoplasm of left main bronchus: Secondary | ICD-10-CM

## 2016-04-14 DIAGNOSIS — M545 Low back pain: Secondary | ICD-10-CM

## 2016-04-14 DIAGNOSIS — R110 Nausea: Secondary | ICD-10-CM

## 2016-04-14 DIAGNOSIS — R739 Hyperglycemia, unspecified: Secondary | ICD-10-CM

## 2016-04-14 DIAGNOSIS — E039 Hypothyroidism, unspecified: Secondary | ICD-10-CM

## 2016-04-14 DIAGNOSIS — M818 Other osteoporosis without current pathological fracture: Secondary | ICD-10-CM

## 2016-04-14 DIAGNOSIS — G4700 Insomnia, unspecified: Secondary | ICD-10-CM

## 2016-04-14 DIAGNOSIS — N952 Postmenopausal atrophic vaginitis: Secondary | ICD-10-CM

## 2016-04-14 DIAGNOSIS — I959 Hypotension, unspecified: Secondary | ICD-10-CM

## 2016-04-14 DIAGNOSIS — E785 Hyperlipidemia, unspecified: Secondary | ICD-10-CM

## 2016-04-14 DIAGNOSIS — R42 Dizziness and giddiness: Secondary | ICD-10-CM | POA: Diagnosis not present

## 2016-04-14 DIAGNOSIS — E538 Deficiency of other specified B group vitamins: Secondary | ICD-10-CM

## 2016-04-14 DIAGNOSIS — R531 Weakness: Secondary | ICD-10-CM

## 2016-04-14 DIAGNOSIS — Z87891 Personal history of nicotine dependence: Secondary | ICD-10-CM

## 2016-04-14 DIAGNOSIS — E876 Hypokalemia: Secondary | ICD-10-CM

## 2016-04-14 DIAGNOSIS — C3432 Malignant neoplasm of lower lobe, left bronchus or lung: Secondary | ICD-10-CM

## 2016-04-14 DIAGNOSIS — F419 Anxiety disorder, unspecified: Secondary | ICD-10-CM

## 2016-04-14 DIAGNOSIS — D696 Thrombocytopenia, unspecified: Secondary | ICD-10-CM

## 2016-04-14 DIAGNOSIS — J449 Chronic obstructive pulmonary disease, unspecified: Secondary | ICD-10-CM

## 2016-04-14 DIAGNOSIS — R1310 Dysphagia, unspecified: Secondary | ICD-10-CM

## 2016-04-14 DIAGNOSIS — R630 Anorexia: Secondary | ICD-10-CM

## 2016-04-14 DIAGNOSIS — D6481 Anemia due to antineoplastic chemotherapy: Secondary | ICD-10-CM

## 2016-04-14 DIAGNOSIS — R5383 Other fatigue: Secondary | ICD-10-CM

## 2016-04-14 LAB — CBC WITH DIFFERENTIAL/PLATELET
BASOS ABS: 0 10*3/uL (ref 0–0.1)
Basophils Relative: 1 %
EOS ABS: 0 10*3/uL (ref 0–0.7)
EOS PCT: 1 %
HCT: 26.9 % — ABNORMAL LOW (ref 35.0–47.0)
Hemoglobin: 9.5 g/dL — ABNORMAL LOW (ref 12.0–16.0)
Lymphocytes Relative: 21 %
Lymphs Abs: 0.9 10*3/uL — ABNORMAL LOW (ref 1.0–3.6)
MCH: 33.6 pg (ref 26.0–34.0)
MCHC: 35.4 g/dL (ref 32.0–36.0)
MCV: 94.8 fL (ref 80.0–100.0)
Monocytes Absolute: 0.5 10*3/uL (ref 0.2–0.9)
Monocytes Relative: 12 %
Neutro Abs: 2.9 10*3/uL (ref 1.4–6.5)
Neutrophils Relative %: 65 %
PLATELETS: 134 10*3/uL — AB (ref 150–440)
RBC: 2.84 MIL/uL — AB (ref 3.80–5.20)
RDW: 21.4 % — ABNORMAL HIGH (ref 11.5–14.5)
WBC: 4.5 10*3/uL (ref 3.6–11.0)

## 2016-04-14 LAB — COMPREHENSIVE METABOLIC PANEL
ALBUMIN: 3.2 g/dL — AB (ref 3.5–5.0)
ALK PHOS: 72 U/L (ref 38–126)
ALT: 15 U/L (ref 14–54)
ANION GAP: 9 (ref 5–15)
AST: 19 U/L (ref 15–41)
BUN: 15 mg/dL (ref 6–20)
CALCIUM: 8.6 mg/dL — AB (ref 8.9–10.3)
CO2: 29 mmol/L (ref 22–32)
Chloride: 99 mmol/L — ABNORMAL LOW (ref 101–111)
Creatinine, Ser: 0.83 mg/dL (ref 0.44–1.00)
GFR calc Af Amer: >60 mL/min (ref >60)
GFR calc non Af Amer: >60 mL/min (ref >60)
GLUCOSE: 146 mg/dL — AB (ref 65–99)
Potassium: 3 mmol/L — ABNORMAL LOW (ref 3.5–5.1)
SODIUM: 137 mmol/L (ref 135–145)
TOTAL PROTEIN: 6.2 g/dL — AB (ref 6.5–8.1)
Total Bilirubin: 0.5 mg/dL (ref 0.3–1.2)

## 2016-04-14 MED ORDER — HEPARIN SOD (PORK) LOCK FLUSH 100 UNIT/ML IV SOLN
500.0000 [IU] | Freq: Once | INTRAVENOUS | Status: DC | PRN
  Filled 2016-04-14: qty 5

## 2016-04-14 MED ORDER — PROMETHAZINE HCL 25 MG PO TABS: 25.0000 mg | ORAL_TABLET | Freq: Four times a day (QID) | ORAL | Status: DC | PRN

## 2016-04-14 MED ORDER — SODIUM CHLORIDE 0.9 % IV SOLN
Freq: Once | INTRAVENOUS | Status: AC
  Administered 2016-04-14: 12:00:00 via INTRAVENOUS
  Filled 2016-04-14: qty 5

## 2016-04-14 MED ORDER — SODIUM CHLORIDE 0.9 % IV SOLN
Freq: Once | INTRAVENOUS | Status: AC
  Administered 2016-04-14: 10:00:00 via INTRAVENOUS
  Filled 2016-04-14: qty 1000

## 2016-04-14 MED ORDER — POTASSIUM CHLORIDE 2 MEQ/ML IV SOLN
Freq: Once | INTRAVENOUS | Status: AC
  Administered 2016-04-14: 10:00:00 via INTRAVENOUS
  Filled 2016-04-14: qty 10

## 2016-04-14 MED ORDER — SODIUM CHLORIDE 0.9% FLUSH
10.0000 mL | INTRAVENOUS | Status: DC | PRN
  Administered 2016-04-14: 10 mL via INTRAVENOUS
  Filled 2016-04-14: qty 10

## 2016-04-14 MED ORDER — SODIUM CHLORIDE 0.9 % IV SOLN
100.0000 mg/m2 | Freq: Once | INTRAVENOUS | Status: AC
  Administered 2016-04-14: 180 mg via INTRAVENOUS
  Filled 2016-04-14: qty 9

## 2016-04-14 MED ORDER — HEPARIN SOD (PORK) LOCK FLUSH 100 UNIT/ML IV SOLN
500.0000 [IU] | Freq: Once | INTRAVENOUS | Status: AC
  Administered 2016-04-14: 500 [IU] via INTRAVENOUS

## 2016-04-14 MED ORDER — PALONOSETRON HCL INJECTION 0.25 MG/5ML
0.2500 mg | Freq: Once | INTRAVENOUS | Status: AC
  Administered 2016-04-14: 0.25 mg via INTRAVENOUS
  Filled 2016-04-14: qty 5

## 2016-04-14 MED ORDER — POTASSIUM CHLORIDE 20 MEQ PO PACK: 20.0000 meq | PACK | Freq: Two times a day (BID) | ORAL | Status: DC

## 2016-04-14 MED ORDER — SODIUM CHLORIDE 0.9% FLUSH
10.0000 mL | INTRAVENOUS | Status: DC | PRN
  Filled 2016-04-14: qty 10

## 2016-04-14 MED ORDER — SODIUM CHLORIDE 0.9 % IV SOLN
80.0000 mg/m2 | Freq: Once | INTRAVENOUS | Status: AC
  Administered 2016-04-14: 145 mg via INTRAVENOUS
  Filled 2016-04-14: qty 145

## 2016-04-14 NOTE — Progress Notes (Signed)
Patient reports feeling better than she did at last appt but while walking to the scales she started feeling dizzy so had to sit down and she does have these dizzy episodes at home.  While sitting after dizzy episode I obtained her bp and it was low at 94/67

## 2016-04-14 NOTE — Progress Notes (Signed)
[place]  [phone]) [phone] [phone]) [phone]  ID: Jill Gross OB: Oct 10, 1956  MR#: [id number]  HUT#:[id number]  Patient Care Team: [name], NP as PCP - General (Nurse Practitioner)  CHIEF COMPLAINT: Small cell lung cancer. Chief Complaint  Patient presents with  . Lung Cancer    INTERVAL HISTORY: Patient returns to clinic today for reconsideration of cycle 4 of 4 cisplatin and etoposide. She again feels weak and fatigued as well as dizzy. She has poor PO intake associated with dysphasia. She also has a cough. She has no neurologic complaints. She denies any recent fevers.  She denies any chest pain, shortness of breath, or hemoptysis.. She denies any vomiting, constipation, or diarrhea. Patient offers no further specific complaints today.  REVIEW OF SYSTEMS:   Review of Systems  Constitutional: Positive for malaise/fatigue. Negative for fever and weight loss.  HENT: Negative for sore throat.   Respiratory: Positive for cough. Negative for hemoptysis and shortness of breath.   Cardiovascular: Negative.  Negative for chest pain.  Gastrointestinal: Negative for nausea.  Genitourinary: Negative.   Musculoskeletal: Negative.   Neurological: Positive for dizziness and weakness.  Psychiatric/Behavioral: The patient has insomnia.     As per HPI. Otherwise, a complete review of systems is negatve.  PAST MEDICAL HISTORY: Past Medical History  Diagnosis Date  . Anemia     pernicious  . Hypothyroid   . Osteopenia   . Insomnia   . Allergy   . COPD (chronic obstructive pulmonary disease) ([place])   . Depression   . Atrophic vaginitis   . Low back pain   . Fatigue   . Lumbago   . Tobacco use   . Hyperlipidemia   . Vitamin B12 deficiency   . Anxiety     PAST SURGICAL HISTORY: Past Surgical History  Procedure Laterality Date  . Cholecystectomy    . Abdominal hysterectomy    . Hernia repair    . Ankle surgery      x4  . Carpal tunnel  release  April 2015  . Endobronchial ultrasound N/A 01/07/2016    Procedure: ENDOBRONCHIAL ULTRASOUND;  Surgeon: Vilinda Boehringer, MD;  Location: [facility] ORS;  Service: Cardiopulmonary;  Laterality: N/A;  . Portacath placement Right 01/23/2016    Procedure: INSERTION PORT-A-CATH;  Surgeon: [name], MD;  Location: [facility] ORS;  Service: General;  Laterality: Right;    FAMILY HISTORY Family History  Problem Relation Age of Onset  . Emphysema Mother   . Diabetes Father        ADVANCED DIRECTIVES:    HEALTH MAINTENANCE: Social History  Substance Use Topics  . Smoking status: Former Smoker -- 1.00 packs/day    Types: Cigarettes  . Smokeless tobacco: Never Used  . Alcohol Use: No     Allergies  Allergen Reactions  . Skelaxin [Metaxalone] Anaphylaxis  . Effexor [Venlafaxine]   . Hctz [Hydrochlorothiazide] Other (See Comments)    cramps  . Macrobid [Nitrofurantoin]   . Neurontin [Gabapentin]   . Paxil [Paroxetine Hcl]   . Pravachol [Pravastatin Sodium] Other (See Comments)    aching  . Prozac [Fluoxetine Hcl]   . Sulfur   . Wellbutrin [Bupropion]   . Zoloft [Sertraline Hcl]     Current Outpatient Prescriptions  Medication Sig Dispense Refill  . albuterol (PROAIR HFA) 108 (90 Base) MCG/ACT inhaler Inhale 2 puffs into the lungs every 4 (four) hours as needed for wheezing or shortness of breath. 8.5 g 1  . ALPRAZolam (XANAX) 0.25 MG tablet  Take 1 tablet (0.25 mg total) by mouth 2 (two) times daily as needed for anxiety. 45 tablet 0  . amitriptyline (ELAVIL) 10 MG tablet Take 1 tablet (10 mg total) by mouth at bedtime. 60 tablet 12  . cimetidine (TAGAMET) 200 MG tablet Take 150 mg by mouth at bedtime.     . cyanocobalamin (,VITAMIN B-12,) 1000 MCG/ML injection Inject 1 mL (1,000 mcg total) into the muscle once. 10 mL 1  . dexamethasone (DECADRON) 4 MG tablet Take 1 tablet (4 mg total) by mouth daily. Take 1 tablet daily for 7 days, then 1 tablet every other day until finished. 12  tablet 0  . escitalopram (LEXAPRO) 20 MG tablet Take 1 tablet (20 mg total) by mouth daily. 30 tablet 6  . glucose blood test strip 1 each by Other route daily. Reported on 01/07/2016    . lansoprazole (PREVACID) 30 MG capsule Take 1 capsule (30 mg total) by mouth daily at 12 noon. 20 capsule 0  . levofloxacin (LEVAQUIN) 500 MG tablet Take 1 tablet (500 mg total) by mouth daily. 10 tablet 0  . levothyroxine (SYNTHROID, LEVOTHROID) 137 MCG tablet Take 137 mcg by mouth daily before breakfast.    . lidocaine-prilocaine (EMLA) cream Apply to affected area once 30 g 3  . Multiple Vitamin (MULTIVITAMIN) tablet Take 1 tablet by mouth daily.    . ondansetron (ZOFRAN) 8 MG tablet Take 1 tablet (8 mg total) by mouth 2 (two) times daily as needed. Start on the third day after cisplatin chemotherapy. 30 tablet 1  . oxyCODONE-acetaminophen (PERCOCET/ROXICET) 5-325 MG tablet Take 1 tablet by mouth every 4 (four) hours as needed for severe pain. 30 tablet 0  . potassium chloride (KLOR-CON) 20 MEQ packet Take 20 mEq by mouth 2 (two) times daily. 30 packet 2  . prochlorperazine (COMPAZINE) 10 MG tablet Take 1 tablet (10 mg total) by mouth every 6 (six) hours as needed (Nausea or vomiting). 30 tablet 1  . promethazine (PHENERGAN) 25 MG tablet Take 1 tablet (25 mg total) by mouth every 6 (six) hours as needed for nausea or vomiting. 30 tablet 0  . simvastatin (ZOCOR) 10 MG tablet Take 1 tablet (10 mg total) by mouth daily at 6 PM. 30 tablet 6  . sucralfate (CARAFATE) 1 g tablet Take 1 tablet (1 g total) by mouth 3 (three) times daily. 90 tablet 3  . zolpidem (AMBIEN) 10 MG tablet Take 1 tablet (10 mg total) by mouth at bedtime. 30 tablet 3  . Diphenhyd-Hydrocort-Nystatin (FIRST-DUKES MOUTHWASH) SUSP Use as directed 5 mLs in the mouth or throat 4 (four) times daily as needed. (Patient not taking: Reported on 03/17/2016) 1 Bottle 2  . magic mouthwash w/lidocaine SOLN Take 5 mLs by mouth 3 (three) times daily as needed for  mouth pain. (Patient not taking: Reported on 03/17/2016) 237 mL 2   No current facility-administered medications for this visit.   Facility-Administered Medications Ordered in Other Visits  Medication Dose Route Frequency Provider Last Rate Last Dose  . heparin lock flush 100 unit/mL  500 Units Intravenous Once Lloyd Huger, MD      . sodium chloride flush (NS) 0.9 % injection 10 mL  10 mL Intravenous PRN Lloyd Huger, MD   10 mL at 04/14/16 0817    OBJECTIVE: Filed Vitals:   04/14/16 0859  BP: 94/67  Pulse: 112  Temp: 97.9 F (36.6 C)     Body mass index is 27.08 kg/(m^2).    ECOG  FS:1 - Symptomatic but completely ambulatory  General: Well-developed, well-nourished, no acute distress. Eyes: Pink conjunctiva, anicteric sclera. HEENT: Oropharynx clear without erythema or exudate. Lungs: Clear to auscultation bilaterally. Heart: Regular rate and rhythm. No rubs, murmurs, or gallops. Abdomen: Soft, nontender, nondistended. No organomegaly noted, normoactive bowel sounds. Musculoskeletal: No edema, cyanosis, or clubbing. Neuro: Alert, answering all questions appropriately. Cranial nerves grossly intact. Skin: No rashes or petechiae noted. Psych: Normal affect.   LAB RESULTS:  Lab Results  Component Value Date   NA 137 04/14/2016   K 3.0* 04/14/2016   CL 99* 04/14/2016   CO2 29 04/14/2016   GLUCOSE 146* 04/14/2016   BUN 15 04/14/2016   CREATININE 0.83 04/14/2016   CALCIUM 8.6* 04/14/2016   PROT 6.2* 04/14/2016   ALBUMIN 3.2* 04/14/2016   AST 19 04/14/2016   ALT 15 04/14/2016   ALKPHOS 72 04/14/2016   BILITOT 0.5 04/14/2016   GFRNONAA >60 04/14/2016   GFRAA >60 04/14/2016    Lab Results  Component Value Date   WBC 4.5 04/14/2016   NEUTROABS 2.9 04/14/2016   HGB 9.5* 04/14/2016   HCT 26.9* 04/14/2016   MCV 94.8 04/14/2016   PLT 134* 04/14/2016     STUDIES: No results found.  ASSESSMENT: Clinical stage IIa, T2 N1 M0 small cell lung  cancer.  PLAN:    1. Small cell lung cancer: PET scan, MRI, and CT scans all reviewed independently. Biopsy consistent with small cell carcinoma of the lung. Proceed with cycle 4 of 4 of cisplatin and etoposide today. Patient has now completed her XRT. She will require PCI at the conclusion of her treatments. PET scan in 6 weeks and then follow up in clinic for results.  2. Hypotension/dizziness: IV fluids PRN. 3. Hyperglycemia: Patient receives dexamethasone as a premedication, monitor. 4. Insomnia: Has been ongoing since her mother passed in 2002. Continue current medications.  5. Nausea:  Continue Phenergan as prescribed. 6. Dysphagia: Continue Carafate as prescribed. Patient was also given a prescription for Dukes mouthwash. 7. Anemia: Secondary chemotherapy. Monitor 8. Thrombocytopenia: Mild, secondary to chemotherapy. Monitor. 9. Hypokalemia: Patient has not been taking potassium due to dysphagia. Sent order for liquid potassium to pharmacy.  Patient expressed understanding and was in agreement with this plan. She also understands that She can call clinic at any time with any questions, concerns, or complaints.   Mayra Reel, NP   04/14/2016 9:40 AM   Patient was seen and evaluated independently and I agree with the assessment and plan as dictated above.  Lloyd Huger, MD 04/21/2016 10:38 AM

## 2016-04-15 ENCOUNTER — Inpatient Hospital Stay: Payer: Managed Care, Other (non HMO)

## 2016-04-15 ENCOUNTER — Other Ambulatory Visit: Payer: Self-pay | Admitting: *Deleted

## 2016-04-15 VITALS — BP 138/83 | HR 88 | Resp 20

## 2016-04-15 DIAGNOSIS — C3432 Malignant neoplasm of lower lobe, left bronchus or lung: Secondary | ICD-10-CM

## 2016-04-15 DIAGNOSIS — C3490 Malignant neoplasm of unspecified part of unspecified bronchus or lung: Secondary | ICD-10-CM | POA: Diagnosis not present

## 2016-04-15 DIAGNOSIS — C3402 Malignant neoplasm of left main bronchus: Secondary | ICD-10-CM

## 2016-04-15 MED ORDER — SODIUM CHLORIDE 0.9 % IV SOLN
100.0000 mg/m2 | Freq: Once | INTRAVENOUS | Status: AC
  Administered 2016-04-15: 180 mg via INTRAVENOUS
  Filled 2016-04-15: qty 9

## 2016-04-15 MED ORDER — HEPARIN SOD (PORK) LOCK FLUSH 100 UNIT/ML IV SOLN
500.0000 [IU] | Freq: Once | INTRAVENOUS | Status: AC | PRN
  Administered 2016-04-15: 500 [IU]

## 2016-04-15 MED ORDER — SODIUM CHLORIDE 0.9 % IV SOLN
10.0000 mg | Freq: Once | INTRAVENOUS | Status: AC
  Administered 2016-04-15: 10 mg via INTRAVENOUS
  Filled 2016-04-15: qty 1

## 2016-04-15 MED ORDER — SODIUM CHLORIDE 0.9 % IV SOLN
Freq: Once | INTRAVENOUS | Status: AC
  Administered 2016-04-15: 15:00:00 via INTRAVENOUS
  Filled 2016-04-15: qty 4

## 2016-04-15 MED ORDER — SODIUM CHLORIDE 0.9 % IV SOLN
Freq: Once | INTRAVENOUS | Status: AC
Start: 2016-04-15 — End: 2016-04-15
  Administered 2016-04-15: 14:00:00 via INTRAVENOUS
  Filled 2016-04-15: qty 1000

## 2016-04-15 MED ORDER — HEPARIN SOD (PORK) LOCK FLUSH 100 UNIT/ML IV SOLN
INTRAVENOUS | Status: AC
  Filled 2016-04-15: qty 5

## 2016-04-16 ENCOUNTER — Inpatient Hospital Stay: Payer: Managed Care, Other (non HMO)

## 2016-04-16 ENCOUNTER — Other Ambulatory Visit: Payer: Self-pay | Admitting: Internal Medicine

## 2016-04-16 VITALS — BP 142/82 | HR 85 | Temp 97.0°F | Resp 20

## 2016-04-16 DIAGNOSIS — C3402 Malignant neoplasm of left main bronchus: Secondary | ICD-10-CM

## 2016-04-16 DIAGNOSIS — C3490 Malignant neoplasm of unspecified part of unspecified bronchus or lung: Secondary | ICD-10-CM | POA: Diagnosis not present

## 2016-04-16 MED ORDER — SODIUM CHLORIDE 0.9 % IV SOLN
Freq: Once | INTRAVENOUS | Status: AC
  Administered 2016-04-16: 14:00:00 via INTRAVENOUS
  Filled 2016-04-16: qty 1000

## 2016-04-16 MED ORDER — SODIUM CHLORIDE 0.9 % IV SOLN
Freq: Once | INTRAVENOUS | Status: AC
  Administered 2016-04-16: 15:00:00 via INTRAVENOUS
  Filled 2016-04-16: qty 4

## 2016-04-16 MED ORDER — HEPARIN SOD (PORK) LOCK FLUSH 100 UNIT/ML IV SOLN
500.0000 [IU] | Freq: Once | INTRAVENOUS | Status: AC | PRN
  Administered 2016-04-16: 500 [IU]

## 2016-04-16 MED ORDER — SODIUM CHLORIDE 0.9 % IV SOLN
100.0000 mg/m2 | Freq: Once | INTRAVENOUS | Status: AC
  Administered 2016-04-16: 180 mg via INTRAVENOUS
  Filled 2016-04-16: qty 9

## 2016-04-16 MED ORDER — SODIUM CHLORIDE 0.9 % IV SOLN: Freq: Once | INTRAVENOUS | Status: DC

## 2016-04-16 MED ORDER — DEXAMETHASONE SODIUM PHOSPHATE 100 MG/10ML IJ SOLN: 10.0000 mg | Freq: Once | INTRAMUSCULAR | Status: DC

## 2016-04-21 ENCOUNTER — Inpatient Hospital Stay: Payer: Managed Care, Other (non HMO)

## 2016-04-21 ENCOUNTER — Telehealth: Payer: Self-pay | Admitting: *Deleted

## 2016-04-21 ENCOUNTER — Inpatient Hospital Stay (HOSPITAL_BASED_OUTPATIENT_CLINIC_OR_DEPARTMENT_OTHER): Payer: Managed Care, Other (non HMO) | Admitting: Family Medicine

## 2016-04-21 VITALS — BP 100/66 | HR 89 | Resp 18

## 2016-04-21 VITALS — BP 81/55 | HR 125 | Temp 96.3°F | Resp 18 | Wt 149.5 lb

## 2016-04-21 DIAGNOSIS — D696 Thrombocytopenia, unspecified: Secondary | ICD-10-CM

## 2016-04-21 DIAGNOSIS — E538 Deficiency of other specified B group vitamins: Secondary | ICD-10-CM

## 2016-04-21 DIAGNOSIS — R42 Dizziness and giddiness: Secondary | ICD-10-CM | POA: Diagnosis not present

## 2016-04-21 DIAGNOSIS — Z5189 Encounter for other specified aftercare: Secondary | ICD-10-CM

## 2016-04-21 DIAGNOSIS — R739 Hyperglycemia, unspecified: Secondary | ICD-10-CM | POA: Diagnosis not present

## 2016-04-21 DIAGNOSIS — I959 Hypotension, unspecified: Secondary | ICD-10-CM | POA: Diagnosis not present

## 2016-04-21 DIAGNOSIS — N952 Postmenopausal atrophic vaginitis: Secondary | ICD-10-CM

## 2016-04-21 DIAGNOSIS — C3490 Malignant neoplasm of unspecified part of unspecified bronchus or lung: Secondary | ICD-10-CM | POA: Diagnosis not present

## 2016-04-21 DIAGNOSIS — R110 Nausea: Secondary | ICD-10-CM

## 2016-04-21 DIAGNOSIS — E876 Hypokalemia: Secondary | ICD-10-CM

## 2016-04-21 DIAGNOSIS — M818 Other osteoporosis without current pathological fracture: Secondary | ICD-10-CM

## 2016-04-21 DIAGNOSIS — M545 Low back pain: Secondary | ICD-10-CM

## 2016-04-21 DIAGNOSIS — D6481 Anemia due to antineoplastic chemotherapy: Secondary | ICD-10-CM

## 2016-04-21 DIAGNOSIS — R1310 Dysphagia, unspecified: Secondary | ICD-10-CM

## 2016-04-21 DIAGNOSIS — C801 Malignant (primary) neoplasm, unspecified: Secondary | ICD-10-CM

## 2016-04-21 DIAGNOSIS — J449 Chronic obstructive pulmonary disease, unspecified: Secondary | ICD-10-CM

## 2016-04-21 DIAGNOSIS — Z87891 Personal history of nicotine dependence: Secondary | ICD-10-CM

## 2016-04-21 DIAGNOSIS — R5383 Other fatigue: Secondary | ICD-10-CM

## 2016-04-21 DIAGNOSIS — D510 Vitamin B12 deficiency anemia due to intrinsic factor deficiency: Secondary | ICD-10-CM

## 2016-04-21 DIAGNOSIS — R112 Nausea with vomiting, unspecified: Secondary | ICD-10-CM

## 2016-04-21 DIAGNOSIS — C3432 Malignant neoplasm of lower lobe, left bronchus or lung: Secondary | ICD-10-CM

## 2016-04-21 DIAGNOSIS — R531 Weakness: Secondary | ICD-10-CM

## 2016-04-21 DIAGNOSIS — F418 Other specified anxiety disorders: Secondary | ICD-10-CM

## 2016-04-21 DIAGNOSIS — E039 Hypothyroidism, unspecified: Secondary | ICD-10-CM

## 2016-04-21 DIAGNOSIS — E785 Hyperlipidemia, unspecified: Secondary | ICD-10-CM

## 2016-04-21 DIAGNOSIS — G4700 Insomnia, unspecified: Secondary | ICD-10-CM

## 2016-04-21 DIAGNOSIS — R630 Anorexia: Secondary | ICD-10-CM

## 2016-04-21 LAB — CBC WITH DIFFERENTIAL/PLATELET
BASOS PCT: 3 %
Basophils Absolute: 0.1 10*3/uL (ref 0–0.1)
EOS ABS: 0 10*3/uL (ref 0–0.7)
Eosinophils Relative: 1 %
HCT: 28.2 % — ABNORMAL LOW (ref 35.0–47.0)
Hemoglobin: 10 g/dL — ABNORMAL LOW (ref 12.0–16.0)
Lymphocytes Relative: 20 %
Lymphs Abs: 0.9 10*3/uL — ABNORMAL LOW (ref 1.0–3.6)
MCH: 33.6 pg (ref 26.0–34.0)
MCHC: 35.4 g/dL (ref 32.0–36.0)
MCV: 94.9 fL (ref 80.0–100.0)
MONO ABS: 0 10*3/uL — AB (ref 0.2–0.9)
MONOS PCT: 0 %
NEUTROS ABS: 3.5 10*3/uL (ref 1.4–6.5)
NEUTROS PCT: 76 %
Platelets: 141 10*3/uL — ABNORMAL LOW (ref 150–440)
RBC: 2.97 MIL/uL — ABNORMAL LOW (ref 3.80–5.20)
RDW: 20.7 % — AB (ref 11.5–14.5)
WBC: 4.6 10*3/uL (ref 3.6–11.0)

## 2016-04-21 LAB — COMPREHENSIVE METABOLIC PANEL
ALBUMIN: 3.6 g/dL (ref 3.5–5.0)
ALT: 21 U/L (ref 14–54)
ANION GAP: 10 (ref 5–15)
AST: 22 U/L (ref 15–41)
Alkaline Phosphatase: 64 U/L (ref 38–126)
BUN: 24 mg/dL — ABNORMAL HIGH (ref 6–20)
CO2: 26 mmol/L (ref 22–32)
Calcium: 8.5 mg/dL — ABNORMAL LOW (ref 8.9–10.3)
Chloride: 96 mmol/L — ABNORMAL LOW (ref 101–111)
Creatinine, Ser: 0.97 mg/dL (ref 0.44–1.00)
GFR calc Af Amer: >60 mL/min (ref >60)
GFR calc non Af Amer: >60 mL/min (ref >60)
GLUCOSE: 172 mg/dL — AB (ref 65–99)
POTASSIUM: 3.2 mmol/L — AB (ref 3.5–5.1)
SODIUM: 132 mmol/L — AB (ref 135–145)
TOTAL PROTEIN: 6.5 g/dL (ref 6.5–8.1)
Total Bilirubin: 0.9 mg/dL (ref 0.3–1.2)

## 2016-04-21 LAB — MAGNESIUM: Magnesium: 1.4 mg/dL — ABNORMAL LOW (ref 1.7–2.4)

## 2016-04-21 MED ORDER — SODIUM CHLORIDE 0.9 % IV SOLN
INTRAVENOUS | Status: DC
  Administered 2016-04-21: 14:00:00 via INTRAVENOUS
  Filled 2016-04-21: qty 1000

## 2016-04-21 MED ORDER — SODIUM CHLORIDE 0.9% FLUSH
10.0000 mL | INTRAVENOUS | Status: DC | PRN
  Administered 2016-04-21: 10 mL via INTRAVENOUS
  Filled 2016-04-21: qty 10

## 2016-04-21 MED ORDER — HEPARIN SOD (PORK) LOCK FLUSH 100 UNIT/ML IV SOLN
500.0000 [IU] | Freq: Once | INTRAVENOUS | Status: AC
Start: 2016-04-21 — End: 2016-04-21
  Administered 2016-04-21: 500 [IU] via INTRAVENOUS

## 2016-04-21 MED ORDER — SODIUM CHLORIDE 0.9 % IV SOLN
Freq: Once | INTRAVENOUS | Status: AC
  Administered 2016-04-21: 14:00:00 via INTRAVENOUS
  Filled 2016-04-21: qty 1000

## 2016-04-21 MED ORDER — SODIUM CHLORIDE 0.9 % IV SOLN
Freq: Once | INTRAVENOUS | Status: AC
  Administered 2016-04-21: 14:00:00 via INTRAVENOUS
  Filled 2016-04-21: qty 4

## 2016-04-21 NOTE — Telephone Encounter (Signed)
Called to report that she has been sick since Friday and she has a low b/p and is nauseated. He feels she needs IVF. Per L Herring, AGNP-C come in for lab, md, ivf today. He agrees to have her here at 1:00

## 2016-04-21 NOTE — Progress Notes (Signed)
Jill Gross  [phone] [phone]  ID: Jill Gross OB: Aug 05, 1956  MR#: [id number]  YQI#:[id number]  Patient Care Team: [name], NP as PCP - General (Nurse Practitioner)  CHIEF COMPLAINT: Small cell lung cancer. Chief Complaint  Patient presents with  . Dizziness  . Nausea    INTERVAL HISTORY: Patient is here as an acute add on regarding weakness, dizziness, nausea and vomiting, and hypotension. She recently completed cycle 4 of 4 cisplatin and etoposide on May 10. She has poor PO intake associated with dysphasia most likely secondary to radiation treatments. She does report that this is improving somewhat. She also has a cough that is nonproductive and chronic in nature. She has no neurologic complaints. She denies any recent fevers.  She denies any chest pain, shortness of breath, or hemoptysis. She is currently drinking about 16-20 ounces of fluids per day and states that she feels very dehydrated. Her husband has been checking her blood pressure at home and she has a very low BP.  REVIEW OF SYSTEMS:   Review of Systems  Constitutional: Positive for malaise/fatigue. Negative for fever and weight loss.  HENT: Positive for sore throat.   Eyes: Negative.   Respiratory: Positive for cough. Negative for hemoptysis and shortness of breath.   Cardiovascular: Negative.  Negative for chest pain.  Gastrointestinal: Positive for nausea and vomiting.  Genitourinary: Negative.   Musculoskeletal: Negative.   Skin: Negative.   Neurological: Positive for dizziness and weakness.    As per HPI. Otherwise, a complete review of systems is negatve.  PAST MEDICAL HISTORY: Past Medical History  Diagnosis Date  . Anemia     pernicious  . Hypothyroid   . Osteopenia   . Insomnia   . Allergy   . COPD (chronic obstructive pulmonary disease) ([place])   . Depression   . Atrophic vaginitis   . Low back pain   . Fatigue   . Lumbago   . Tobacco use     . Hyperlipidemia   . Vitamin B12 deficiency   . Anxiety     PAST SURGICAL HISTORY: Past Surgical History  Procedure Laterality Date  . Cholecystectomy    . Abdominal hysterectomy    . Hernia repair    . Ankle surgery      x4  . Carpal tunnel release  April 2015  . Endobronchial ultrasound N/A 01/07/2016    Procedure: ENDOBRONCHIAL ULTRASOUND;  Surgeon: Vilinda Boehringer, MD;  Location: [facility] ORS;  Service: Cardiopulmonary;  Laterality: N/A;  . Portacath placement Right 01/23/2016    Procedure: INSERTION PORT-A-CATH;  Surgeon: [name], MD;  Location: [facility] ORS;  Service: General;  Laterality: Right;    FAMILY HISTORY Family History  Problem Relation Age of Onset  . Emphysema Mother   . Diabetes Father        ADVANCED DIRECTIVES:    HEALTH MAINTENANCE: Social History  Substance Use Topics  . Smoking status: Former Smoker -- 1.00 packs/day    Types: Cigarettes  . Smokeless tobacco: Never Used  . Alcohol Use: No     Allergies  Allergen Reactions  . Skelaxin [Metaxalone] Anaphylaxis  . Effexor [Venlafaxine]   . Hctz [Hydrochlorothiazide] Other (See Comments)    cramps  . Macrobid [Nitrofurantoin]   . Neurontin [Gabapentin]   . Paxil [Paroxetine Hcl]   . Pravachol [Pravastatin Sodium] Other (See Comments)    aching  . Prozac [Fluoxetine Hcl]   . Sulfur   . Wellbutrin [Bupropion]   . Zoloft [  Sertraline Hcl]     Current Outpatient Prescriptions  Medication Sig Dispense Refill  . albuterol (PROAIR HFA) 108 (90 Base) MCG/ACT inhaler Inhale 2 puffs into the lungs every 4 (four) hours as needed for wheezing or shortness of breath. 8.5 g 1  . amitriptyline (ELAVIL) 10 MG tablet Take 1 tablet (10 mg total) by mouth at bedtime. 60 tablet 12  . cimetidine (TAGAMET) 200 MG tablet Take 150 mg by mouth at bedtime.     . cyanocobalamin (,VITAMIN B-12,) 1000 MCG/ML injection Inject 1 mL (1,000 mcg total) into the muscle once. 10 mL 1  . escitalopram (LEXAPRO) 20 MG tablet  Take 1 tablet (20 mg total) by mouth daily. 30 tablet 6  . levothyroxine (SYNTHROID, LEVOTHROID) 137 MCG tablet Take 137 mcg by mouth daily before breakfast.    . lidocaine-prilocaine (EMLA) cream Apply to affected area once 30 g 3  . ondansetron (ZOFRAN) 8 MG tablet Take 1 tablet (8 mg total) by mouth 2 (two) times daily as needed. Start on the third day after cisplatin chemotherapy. 30 tablet 1  . potassium chloride (KLOR-CON) 20 MEQ packet Take 20 mEq by mouth 2 (two) times daily. 30 packet 2  . prochlorperazine (COMPAZINE) 10 MG tablet Take 1 tablet (10 mg total) by mouth every 6 (six) hours as needed (Nausea or vomiting). 30 tablet 1  . promethazine (PHENERGAN) 25 MG tablet Take 1 tablet (25 mg total) by mouth every 6 (six) hours as needed for nausea or vomiting. 30 tablet 0  . simvastatin (ZOCOR) 10 MG tablet Take 1 tablet (10 mg total) by mouth daily at 6 PM. 30 tablet 6  . sucralfate (CARAFATE) 1 g tablet Take 1 tablet (1 g total) by mouth 3 (three) times daily. 90 tablet 3  . zolpidem (AMBIEN) 10 MG tablet Take 1 tablet (10 mg total) by mouth at bedtime. 30 tablet 3   Current Facility-Administered Medications  Medication Dose Route Frequency Provider Last Rate Last Dose  . ondansetron (ZOFRAN) 8 mg, dexamethasone (DECADRON) 10 mg in sodium chloride 0.9 % 50 mL IVPB   Intravenous Once Evlyn Kanner, NP      . sodium chloride 0.9 % 1,000 mL with potassium chloride 20 mEq, magnesium sulfate 2 g infusion   Intravenous Once Evlyn Kanner, NP        OBJECTIVE: Filed Vitals:   04/21/16 1345  BP: 81/55  Pulse: 125  Temp: 96.3 F (35.7 C)  Resp: 18     Body mass index is 25.64 kg/(m^2).    ECOG FS:2 - Symptomatic, <50% confined to bed  General: Ill appearing, weak, no other acute distress. Eyes: Pink conjunctiva, anicteric sclera. HEENT: Oropharynx clear without erythema or exudate. Lungs: Clear to auscultation bilaterally. Heart: Tachycardia. No rubs, murmurs, or  gallops. Musculoskeletal: No edema, cyanosis, or clubbing. Neuro: Alert, answering all questions appropriately. Cranial nerves grossly intact. Skin: No rashes or petechiae noted. Poor skin turgor. Psych: Normal affect.   LAB RESULTS:  Lab Results  Component Value Date   NA 132* 04/21/2016   K 3.2* 04/21/2016   CL 96* 04/21/2016   CO2 26 04/21/2016   GLUCOSE 172* 04/21/2016   BUN 24* 04/21/2016   CREATININE 0.97 04/21/2016   CALCIUM 8.5* 04/21/2016   PROT 6.5 04/21/2016   ALBUMIN 3.6 04/21/2016   AST 22 04/21/2016   ALT 21 04/21/2016   ALKPHOS 64 04/21/2016   BILITOT 0.9 04/21/2016   GFRNONAA >60 04/21/2016   GFRAA >60 04/21/2016  Lab Results  Component Value Date   WBC 4.6 04/21/2016   NEUTROABS 3.5 04/21/2016   HGB 10.0* 04/21/2016   HCT 28.2* 04/21/2016   MCV 94.9 04/21/2016   PLT 141* 04/21/2016     STUDIES: No results found.  ASSESSMENT: Clinical stage IIa, T2 N1 M0 small cell lung cancer.  PLAN:    1. Small cell lung cancer: PET scan, MRI, and CT scans all reviewed independently. Biopsy consistent with small cell carcinoma of the lung. Patient has completed cycle 4 of 4 of cisplatin and etoposide on May 8. Patient has now completed her XRT. She will require PCI at the conclusion of her treatments. PET scan in 6 weeks and then follow up in clinic for results.  2. Hypotension/dizziness: Patient will receive 1 L of normal saline with 20 mEq of potassium as well as 2 g of magnesium. May require an extra liter, Infusion nurses to monitor BP and report back. 3. Nausea:  We'll administer IV Zofran, dexamethasone in office. Patient advised to continue Zofran and Phenergan as prescribed. 4. Dysphagia: Continue Carafate as prescribed. Patient was also given a prescription for Dukes mouthwash. 5. Hypokalemia: Patient has not been taking potassium due to dysphagia. Patient has been unable to obtain potassium packets and therefore has not been taking. Advised patient that  she could melt her potassium, strain the water, then take with applesauce. 6. Hypomagnesemia: Patient will receive 2 g of IV supplementation today.  Patient expressed understanding and was in agreement with this plan. She also understands that She can call clinic at any time with any questions, concerns, or complaints.   Dr. Rogue Bussing was available for consultation and review of plan of care for this patient.  Evlyn Kanner, NP   04/21/2016 1:59 PM

## 2016-04-21 NOTE — Progress Notes (Signed)
States is having dizziness with nausea that started a couple days ago. Denies headache or vision changes. Decreased oral intake due to nausea and difficulty swallowing from sore throat caused by radiation treatments.

## 2016-04-21 NOTE — Progress Notes (Signed)
15:35: Jill Gross and reported patient's BP - patient feeling better, no nausea at this point,  Patient to go home and call if needs to come back/not feeling well.  LJ

## 2016-04-28 ENCOUNTER — Inpatient Hospital Stay: Payer: Managed Care, Other (non HMO)

## 2016-04-28 ENCOUNTER — Telehealth: Payer: Self-pay | Admitting: *Deleted

## 2016-04-28 ENCOUNTER — Inpatient Hospital Stay (HOSPITAL_BASED_OUTPATIENT_CLINIC_OR_DEPARTMENT_OTHER): Payer: Managed Care, Other (non HMO) | Admitting: Oncology

## 2016-04-28 VITALS — BP 111/72 | HR 93 | Resp 18

## 2016-04-28 VITALS — BP 92/59 | HR 106 | Temp 98.0°F | Resp 18

## 2016-04-28 DIAGNOSIS — R42 Dizziness and giddiness: Secondary | ICD-10-CM | POA: Diagnosis not present

## 2016-04-28 DIAGNOSIS — E538 Deficiency of other specified B group vitamins: Secondary | ICD-10-CM

## 2016-04-28 DIAGNOSIS — E785 Hyperlipidemia, unspecified: Secondary | ICD-10-CM

## 2016-04-28 DIAGNOSIS — E876 Hypokalemia: Secondary | ICD-10-CM

## 2016-04-28 DIAGNOSIS — I959 Hypotension, unspecified: Secondary | ICD-10-CM | POA: Diagnosis not present

## 2016-04-28 DIAGNOSIS — D6481 Anemia due to antineoplastic chemotherapy: Secondary | ICD-10-CM

## 2016-04-28 DIAGNOSIS — J449 Chronic obstructive pulmonary disease, unspecified: Secondary | ICD-10-CM

## 2016-04-28 DIAGNOSIS — R1310 Dysphagia, unspecified: Secondary | ICD-10-CM

## 2016-04-28 DIAGNOSIS — G4700 Insomnia, unspecified: Secondary | ICD-10-CM

## 2016-04-28 DIAGNOSIS — D510 Vitamin B12 deficiency anemia due to intrinsic factor deficiency: Secondary | ICD-10-CM

## 2016-04-28 DIAGNOSIS — R5383 Other fatigue: Secondary | ICD-10-CM

## 2016-04-28 DIAGNOSIS — Z87891 Personal history of nicotine dependence: Secondary | ICD-10-CM

## 2016-04-28 DIAGNOSIS — R202 Paresthesia of skin: Secondary | ICD-10-CM

## 2016-04-28 DIAGNOSIS — M818 Other osteoporosis without current pathological fracture: Secondary | ICD-10-CM

## 2016-04-28 DIAGNOSIS — M545 Low back pain: Secondary | ICD-10-CM

## 2016-04-28 DIAGNOSIS — D649 Anemia, unspecified: Secondary | ICD-10-CM

## 2016-04-28 DIAGNOSIS — N952 Postmenopausal atrophic vaginitis: Secondary | ICD-10-CM

## 2016-04-28 DIAGNOSIS — E860 Dehydration: Secondary | ICD-10-CM

## 2016-04-28 DIAGNOSIS — C3490 Malignant neoplasm of unspecified part of unspecified bronchus or lung: Secondary | ICD-10-CM | POA: Diagnosis not present

## 2016-04-28 DIAGNOSIS — R630 Anorexia: Secondary | ICD-10-CM

## 2016-04-28 DIAGNOSIS — F419 Anxiety disorder, unspecified: Secondary | ICD-10-CM

## 2016-04-28 DIAGNOSIS — C3402 Malignant neoplasm of left main bronchus: Secondary | ICD-10-CM

## 2016-04-28 DIAGNOSIS — E039 Hypothyroidism, unspecified: Secondary | ICD-10-CM

## 2016-04-28 DIAGNOSIS — R739 Hyperglycemia, unspecified: Secondary | ICD-10-CM

## 2016-04-28 DIAGNOSIS — R110 Nausea: Secondary | ICD-10-CM

## 2016-04-28 DIAGNOSIS — R531 Weakness: Secondary | ICD-10-CM

## 2016-04-28 DIAGNOSIS — D696 Thrombocytopenia, unspecified: Secondary | ICD-10-CM

## 2016-04-28 LAB — COMPREHENSIVE METABOLIC PANEL
ALBUMIN: 3.4 g/dL — AB (ref 3.5–5.0)
ALT: 15 U/L (ref 14–54)
ANION GAP: 9 (ref 5–15)
AST: 24 U/L (ref 15–41)
Alkaline Phosphatase: 57 U/L (ref 38–126)
BUN: 7 mg/dL (ref 6–20)
CO2: 30 mmol/L (ref 22–32)
Calcium: 7.8 mg/dL — ABNORMAL LOW (ref 8.9–10.3)
Chloride: 95 mmol/L — ABNORMAL LOW (ref 101–111)
Creatinine, Ser: 0.67 mg/dL (ref 0.44–1.00)
GFR calc Af Amer: >60 mL/min (ref >60)
GFR calc non Af Amer: >60 mL/min (ref >60)
GLUCOSE: 141 mg/dL — AB (ref 65–99)
POTASSIUM: 2.5 mmol/L — AB (ref 3.5–5.1)
SODIUM: 134 mmol/L — AB (ref 135–145)
TOTAL PROTEIN: 6.5 g/dL (ref 6.5–8.1)
Total Bilirubin: 0.6 mg/dL (ref 0.3–1.2)

## 2016-04-28 LAB — CBC WITH DIFFERENTIAL/PLATELET
Basophils Absolute: 0 10*3/uL (ref 0–0.1)
EOS ABS: 0 10*3/uL (ref 0–0.7)
Eosinophils Relative: 2 %
HCT: 18.2 % — ABNORMAL LOW (ref 35.0–47.0)
Hemoglobin: 6.5 g/dL — ABNORMAL LOW (ref 12.0–16.0)
Lymphocytes Relative: 85 %
Lymphs Abs: 0.5 10*3/uL — ABNORMAL LOW (ref 1.0–3.6)
MCH: 33.6 pg (ref 26.0–34.0)
MCHC: 36 g/dL (ref 32.0–36.0)
MCV: 93.4 fL (ref 80.0–100.0)
MONO ABS: 0.1 10*3/uL — AB (ref 0.2–0.9)
Neutro Abs: 0 10*3/uL — ABNORMAL LOW (ref 1.4–6.5)
Neutrophils Relative %: 1 %
PLATELETS: 17 10*3/uL — AB (ref 150–440)
RBC: 1.95 MIL/uL — ABNORMAL LOW (ref 3.80–5.20)
RDW: 19.6 % — AB (ref 11.5–14.5)
WBC: 0.6 10*3/uL — CL (ref 3.6–11.0)

## 2016-04-28 LAB — MAGNESIUM: Magnesium: 1 mg/dL — ABNORMAL LOW (ref 1.7–2.4)

## 2016-04-28 LAB — PREPARE RBC (CROSSMATCH)

## 2016-04-28 MED ORDER — SODIUM CHLORIDE 0.9 % IV SOLN
Freq: Once | INTRAVENOUS | Status: AC
  Administered 2016-04-28: 14:00:00 via INTRAVENOUS
  Filled 2016-04-28: qty 1000

## 2016-04-28 MED ORDER — POTASSIUM CHLORIDE 20 MEQ/100ML IV SOLN
20.0000 meq | Freq: Once | INTRAVENOUS | Status: DC
Start: 2016-04-28 — End: 2016-04-28

## 2016-04-28 MED ORDER — LORAZEPAM 0.5 MG PO TABS
0.5000 mg | ORAL_TABLET | Freq: Three times a day (TID) | ORAL | Status: DC | PRN
Start: 2016-04-28 — End: 2016-08-15

## 2016-04-28 MED ORDER — SODIUM CHLORIDE 0.9 % IV SOLN: 2.0000 g | Freq: Once | INTRAVENOUS | Status: DC

## 2016-04-28 MED ORDER — MAGNESIUM SULFATE 50 % IJ SOLN
4.0000 g | Freq: Once | INTRAMUSCULAR | Status: DC
Start: 2016-04-29 — End: 2016-04-29

## 2016-04-28 MED ORDER — LORAZEPAM 0.5 MG PO TABS: 0.5000 mg | ORAL_TABLET | Freq: Three times a day (TID) | ORAL | Status: DC | PRN

## 2016-04-28 MED ORDER — SODIUM CHLORIDE 0.9 % IV SOLN
Freq: Once | INTRAVENOUS | Status: DC
  Filled 2016-04-28: qty 100

## 2016-04-28 MED ORDER — SODIUM CHLORIDE 0.9 % IV SOLN
Freq: Once | INTRAVENOUS | Status: AC
  Administered 2016-04-28: 15:00:00 via INTRAVENOUS
  Filled 2016-04-28: qty 4

## 2016-04-28 MED ORDER — SODIUM CHLORIDE 0.9 % IV SOLN
Freq: Once | INTRAVENOUS | Status: AC
  Administered 2016-04-28: 16:00:00 via INTRAVENOUS
  Filled 2016-04-28: qty 100

## 2016-04-28 MED ORDER — HEPARIN SOD (PORK) LOCK FLUSH 100 UNIT/ML IV SOLN
500.0000 [IU] | Freq: Once | INTRAVENOUS | Status: AC
  Administered 2016-04-28: 500 [IU] via INTRAVENOUS

## 2016-04-28 MED ORDER — SODIUM CHLORIDE 0.9 % IV SOLN: 40.0000 meq | Freq: Once | INTRAVENOUS | Status: DC

## 2016-04-28 NOTE — Progress Notes (Signed)
[place]  [phone] [phone]) [phone]  ID: Jill Gross OB: 06-28-1956  MR#: [id number]  WVP#:[id number]  Patient Care Team: [name], NP as PCP - General (Nurse Practitioner)  CHIEF COMPLAINT: Small cell lung cancer. Chief Complaint  Patient presents with  . Acute Visit  . Lung Cancer    INTERVAL HISTORY: Patient is here as an acute add on regarding weakness, dizziness, nausea, and numbness and tingling in her feet, hands and lips. She recently completed cycle 4 of 4 cisplatin and etoposide on May 10. She had IVF, ondansetron, dexamethazone, mag and potassium on May 15th and reports that she didn't get too much improvement after that. Her blood pressure is low today. She is trying to eat and drink as much as she can but she just gets more nauseated. She isn't able to get up and walk much because she feels so weak.  She denies any recent fevers.  She denies any chest pain, shortness of breath, or hemoptysis. Her husband has been checking her blood pressure at home and she has a very low BP.  REVIEW OF SYSTEMS:   Review of Systems  Constitutional: Positive for malaise/fatigue. Negative for fever and weight loss.  HENT: Negative for sore throat.   Eyes: Negative.   Respiratory: Negative for cough, hemoptysis and shortness of breath.   Cardiovascular: Negative.  Negative for chest pain.  Gastrointestinal: Positive for nausea. Negative for vomiting.  Genitourinary: Negative.   Musculoskeletal: Negative.   Skin: Negative.   Neurological: Positive for dizziness, tingling, sensory change and weakness.    As per HPI. Otherwise, a complete review of systems is negatve.  PAST MEDICAL HISTORY: Past Medical History  Diagnosis Date  . Anemia     pernicious  . Hypothyroid   . Osteopenia   . Insomnia   . Allergy   . COPD (chronic obstructive pulmonary disease) ([place])   . Depression   . Atrophic vaginitis   . Low back pain   . Fatigue   .  Lumbago   . Tobacco use   . Hyperlipidemia   . Vitamin B12 deficiency   . Anxiety     PAST SURGICAL HISTORY: Past Surgical History  Procedure Laterality Date  . Cholecystectomy    . Abdominal hysterectomy    . Hernia repair    . Ankle surgery      x4  . Carpal tunnel release  April 2015  . Endobronchial ultrasound N/A 01/07/2016    Procedure: ENDOBRONCHIAL ULTRASOUND;  Surgeon: Vilinda Boehringer, MD;  Location: [facility] ORS;  Service: Cardiopulmonary;  Laterality: N/A;  . Portacath placement Right 01/23/2016    Procedure: INSERTION PORT-A-CATH;  Surgeon: [name], MD;  Location: [facility] ORS;  Service: General;  Laterality: Right;    FAMILY HISTORY Family History  Problem Relation Age of Onset  . Emphysema Mother   . Diabetes Father        ADVANCED DIRECTIVES:    HEALTH MAINTENANCE: Social History  Substance Use Topics  . Smoking status: Former Smoker -- 1.00 packs/day    Types: Cigarettes  . Smokeless tobacco: Never Used  . Alcohol Use: No     Allergies  Allergen Reactions  . Skelaxin [Metaxalone] Anaphylaxis  . Effexor [Venlafaxine]   . Hctz [Hydrochlorothiazide] Other (See Comments)    cramps  . Macrobid [Nitrofurantoin]   . Neurontin [Gabapentin]   . Paxil [Paroxetine Hcl]   . Pravachol [Pravastatin Sodium] Other (See Comments)    aching  . Prozac [Fluoxetine Hcl]   .  Sulfur   . Wellbutrin [Bupropion]   . Zoloft [Sertraline Hcl]     Current Outpatient Prescriptions  Medication Sig Dispense Refill  . albuterol (PROAIR HFA) 108 (90 Base) MCG/ACT inhaler Inhale 2 puffs into the lungs every 4 (four) hours as needed for wheezing or shortness of breath. 8.5 g 1  . amitriptyline (ELAVIL) 10 MG tablet Take 1 tablet (10 mg total) by mouth at bedtime. 60 tablet 12  . cimetidine (TAGAMET) 200 MG tablet Take 150 mg by mouth at bedtime.     . cyanocobalamin (,VITAMIN B-12,) 1000 MCG/ML injection Inject 1 mL (1,000 mcg total) into the muscle once. 10 mL 1  . escitalopram  (LEXAPRO) 20 MG tablet Take 1 tablet (20 mg total) by mouth daily. 30 tablet 6  . levothyroxine (SYNTHROID, LEVOTHROID) 137 MCG tablet Take 137 mcg by mouth daily before breakfast.    . lidocaine-prilocaine (EMLA) cream Apply to affected area once 30 g 3  . ondansetron (ZOFRAN) 8 MG tablet Take 1 tablet (8 mg total) by mouth 2 (two) times daily as needed. Start on the third day after cisplatin chemotherapy. 30 tablet 1  . potassium chloride (KLOR-CON) 20 MEQ packet Take 20 mEq by mouth 2 (two) times daily. 30 packet 2  . prochlorperazine (COMPAZINE) 10 MG tablet Take 1 tablet (10 mg total) by mouth every 6 (six) hours as needed (Nausea or vomiting). 30 tablet 1  . promethazine (PHENERGAN) 25 MG tablet Take 1 tablet (25 mg total) by mouth every 6 (six) hours as needed for nausea or vomiting. 30 tablet 0  . simvastatin (ZOCOR) 10 MG tablet Take 1 tablet (10 mg total) by mouth daily at 6 PM. 30 tablet 6  . sucralfate (CARAFATE) 1 g tablet Take 1 tablet (1 g total) by mouth 3 (three) times daily. 90 tablet 3  . zolpidem (AMBIEN) 10 MG tablet Take 1 tablet (10 mg total) by mouth at bedtime. 30 tablet 3  . LORazepam (ATIVAN) 0.5 MG tablet Take 1 tablet (0.5 mg total) by mouth every 8 (eight) hours as needed for anxiety. 30 tablet 0   Current Facility-Administered Medications  Medication Dose Route Frequency Provider Last Rate Last Dose  . 0.9 %  sodium chloride infusion   Intravenous Once Mayra Reel, NP      . ondansetron (ZOFRAN) 8 mg, dexamethasone (DECADRON) 10 mg in sodium chloride 0.9 % 50 mL IVPB   Intravenous Once Mayra Reel, NP        OBJECTIVE: Filed Vitals:   04/28/16 1342  BP: 92/59  Pulse: 106  Temp: 98 F (36.7 C)  Resp: 18     There is no weight on file to calculate BMI.    ECOG FS:2 - Symptomatic, <50% confined to bed  General: Ill appearing, weak, no other acute distress. Sitting in wheelchair Eyes: Pink conjunctiva, anicteric sclera. HEENT: Oropharynx clear without  erythema or exudate. Lungs: Clear to auscultation bilaterally. Heart: Tachycardia. No rubs, murmurs, or gallops. Musculoskeletal: No edema, cyanosis, or clubbing. Neuro: Alert, answering all questions appropriately. Cranial nerves grossly intact. Skin: No rashes or petechiae noted. Poor skin turgor. Psych: Normal affect.   LAB RESULTS:  Lab Results  Component Value Date   NA 132* 04/21/2016   K 3.2* 04/21/2016   CL 96* 04/21/2016   CO2 26 04/21/2016   GLUCOSE 172* 04/21/2016   BUN 24* 04/21/2016   CREATININE 0.97 04/21/2016   CALCIUM 8.5* 04/21/2016   PROT 6.5 04/21/2016   ALBUMIN 3.6 04/21/2016  AST 22 04/21/2016   ALT 21 04/21/2016   ALKPHOS 64 04/21/2016   BILITOT 0.9 04/21/2016   GFRNONAA >60 04/21/2016   GFRAA >60 04/21/2016    Lab Results  Component Value Date   WBC 4.6 04/21/2016   NEUTROABS 3.5 04/21/2016   HGB 10.0* 04/21/2016   HCT 28.2* 04/21/2016   MCV 94.9 04/21/2016   PLT 141* 04/21/2016     STUDIES: No results found.  ASSESSMENT: Clinical stage IIa, T2 N1 M0 small cell lung cancer.  PLAN:    1. Small cell lung cancer: Patient has completed cycle 4 of 4 of cisplatin and etoposide on May 8. Patient has now completed her XRT. She will require PCI at the conclusion of her treatments. PET scan in 6 weeks and then follow up in clinic for results.  2. Hypotension/dizziness: Patient will receive 1 L of normal saline.  3. Nausea:  We'll administer IV Zofran, dexamethasone in office. Patient advised to continue Zofran and Phenergan as prescribed. 4. Dysphagia: Continue Carafate as prescribed. Patient was also given a prescription for Dukes mouthwash. 5. Hypokalemia: Potassium level 2.5 today. Give 20 meq IV potassium today and then 40 meq IV potassium tomorrow 6. Hypomagnesemia: Magnesium level 1.0 today give 2 g IV mag today and 4 g IV mag tomorrow 7. Numbness/tingling: Possibly secondary to low potassium level.  8. Anxiety: Ativan prescription given  today. 9. Anemia: HGB 6.5 - 2 units of blood tomorrow.  RTC on Friday for lab and IVF, mag, and potassium pending lab results.   Patient expressed understanding and was in agreement with this plan. She also understands that She can call clinic at any time with any questions, concerns, or complaints.    Mayra Reel, NP   04/28/2016 2:14 PM  Patient was seen and evaluated independently and I agree with the assessment and plan as dictated above.  Lloyd Huger, MD 04/30/2016 10:53 AM

## 2016-04-28 NOTE — Progress Notes (Signed)
Patient has tingling with needle sticking sensation in feet, toes, legs, and fingers.  She has nausea that is not relieved with medication.  Also feeling very weak with dizziness.

## 2016-04-28 NOTE — Telephone Encounter (Signed)
Called to ask if she can get IVF today and if she can see MD because she feels numb all over. OK per Dr Leonette Monarch to 130 appt today

## 2016-04-29 ENCOUNTER — Inpatient Hospital Stay: Payer: Managed Care, Other (non HMO)

## 2016-04-29 ENCOUNTER — Other Ambulatory Visit: Payer: Self-pay | Admitting: Oncology

## 2016-04-29 ENCOUNTER — Ambulatory Visit: Payer: Managed Care, Other (non HMO)

## 2016-04-29 ENCOUNTER — Other Ambulatory Visit: Payer: Self-pay | Admitting: Unknown Physician Specialty

## 2016-04-29 VITALS — BP 148/83 | HR 76 | Temp 95.9°F | Resp 20

## 2016-04-29 DIAGNOSIS — D649 Anemia, unspecified: Secondary | ICD-10-CM

## 2016-04-29 DIAGNOSIS — R110 Nausea: Secondary | ICD-10-CM

## 2016-04-29 DIAGNOSIS — E876 Hypokalemia: Secondary | ICD-10-CM

## 2016-04-29 DIAGNOSIS — C3432 Malignant neoplasm of lower lobe, left bronchus or lung: Secondary | ICD-10-CM

## 2016-04-29 DIAGNOSIS — C3490 Malignant neoplasm of unspecified part of unspecified bronchus or lung: Secondary | ICD-10-CM | POA: Diagnosis not present

## 2016-04-29 LAB — PREPARE RBC (CROSSMATCH)

## 2016-04-29 MED ORDER — SODIUM CHLORIDE 0.9% FLUSH
10.0000 mL | INTRAVENOUS | Status: AC | PRN
  Administered 2016-04-29: 10 mL
  Filled 2016-04-29: qty 10

## 2016-04-29 MED ORDER — SODIUM CHLORIDE 0.9 % IV SOLN: Freq: Once | INTRAVENOUS | Status: DC

## 2016-04-29 MED ORDER — ACETAMINOPHEN 325 MG PO TABS
650.0000 mg | ORAL_TABLET | Freq: Once | ORAL | Status: AC
  Administered 2016-04-29: 650 mg via ORAL
  Filled 2016-04-29: qty 2

## 2016-04-29 MED ORDER — SODIUM CHLORIDE 0.9 % IV SOLN
250.0000 mL | Freq: Once | INTRAVENOUS | Status: AC
  Administered 2016-04-29: 250 mL via INTRAVENOUS
  Filled 2016-04-29: qty 250

## 2016-04-29 MED ORDER — SODIUM CHLORIDE 0.9 % IV SOLN
Freq: Once | INTRAVENOUS | Status: DC
  Filled 2016-04-29: qty 20

## 2016-04-29 MED ORDER — SODIUM CHLORIDE 0.9 % IV SOLN
40.0000 meq | Freq: Once | INTRAVENOUS | Status: DC
  Filled 2016-04-29: qty 20

## 2016-04-29 MED ORDER — DIPHENHYDRAMINE HCL 25 MG PO CAPS
25.0000 mg | ORAL_CAPSULE | Freq: Once | ORAL | Status: AC
  Administered 2016-04-29: 25 mg via ORAL
  Filled 2016-04-29: qty 1

## 2016-04-29 MED ORDER — SODIUM CHLORIDE 0.9 % IV SOLN
Freq: Once | INTRAVENOUS | Status: AC
  Administered 2016-04-29: 10:00:00 via INTRAVENOUS
  Filled 2016-04-29: qty 250

## 2016-04-29 MED ORDER — SODIUM CHLORIDE 0.9 % IV SOLN
INTRAVENOUS | Status: DC
  Filled 2016-04-29: qty 250

## 2016-04-29 MED ORDER — SODIUM CHLORIDE 0.9 % IV SOLN
Freq: Once | INTRAVENOUS | Status: AC
  Administered 2016-04-29: 10:00:00 via INTRAVENOUS
  Filled 2016-04-29: qty 4

## 2016-04-29 MED ORDER — ONDANSETRON HCL 40 MG/20ML IJ SOLN
Freq: Once | INTRAMUSCULAR | Status: AC
Start: 2016-04-29 — End: 2016-04-29
  Administered 2016-04-29: 16:00:00 via INTRAVENOUS
  Filled 2016-04-29: qty 4

## 2016-04-29 MED ORDER — HEPARIN SOD (PORK) LOCK FLUSH 100 UNIT/ML IV SOLN
500.0000 [IU] | Freq: Every day | INTRAVENOUS | Status: AC | PRN
  Administered 2016-04-29: 500 [IU]
  Filled 2016-04-29: qty 5

## 2016-04-29 NOTE — Progress Notes (Signed)
Pt c/o nausea. Orders received from Mayra Reel, NP for Zofran '8mg'$  IV.

## 2016-04-30 ENCOUNTER — Ambulatory Visit: Payer: Managed Care, Other (non HMO) | Admitting: Oncology

## 2016-04-30 ENCOUNTER — Ambulatory Visit
Admission: RE | Admit: 2016-04-30 | Discharge: 2016-04-30 | Disposition: A | Discharge status: Home / Self Care | Payer: Managed Care, Other (non HMO) | Source: Ambulatory Visit | Attending: Radiation Oncology | Admitting: Radiation Oncology

## 2016-04-30 ENCOUNTER — Encounter: Payer: Self-pay | Admitting: Radiation Oncology

## 2016-04-30 VITALS — BP 122/75 | HR 87 | Temp 95.7°F | Resp 18 | Ht 64.0 in | Wt 155.0 lb

## 2016-04-30 DIAGNOSIS — C3432 Malignant neoplasm of lower lobe, left bronchus or lung: Secondary | ICD-10-CM

## 2016-04-30 LAB — TYPE AND SCREEN
ABO/RH(D): A NEG
Antibody Screen: NEGATIVE
Unit division: 0
Unit division: 0

## 2016-04-30 NOTE — Progress Notes (Signed)
Radiation Oncology Follow up Note  Name: Jill Gross   Date:   04/30/2016 MRN:  [id number] DOB: [birth year]    This 60 y.o. female presents to the clinic today for one-month follow-up status post consolidative radiation therapy for left lung limited stage small cell lung cancer.  REFERRING PROVIDER: [name], NP  HPI: Patient is a 60 year old female now out 1 month having completed consolidative radiation therapy to her chest for a 4.7 cm mass in the left infrahilar region biopsy positive for small cell lung cancer. She is completed chemotherapy is feeling quite washed out and fatigued had blood transfusions yesterday as well as fluids. She still has some mild dysphagia which is improving. She specifically denies cough hemoptysis or chest tightness..  COMPLICATIONS OF TREATMENT: none  FOLLOW UP COMPLIANCE: keeps appointments   PHYSICAL EXAM:  BP 122/75 mmHg  Pulse 87  Temp(Src) 95.7 F (35.4 C)  Resp 18  Ht '5\' 4"'$  (1.626 m)  Wt 154 lb 15.7 oz (70.3 kg)  BMI 26.59 kg/m2  LMP  (LMP Unknown) Well-developed female wheelchair-bound in NAD. Well-developed well-nourished patient in NAD. HEENT reveals PERLA, EOMI, discs not visualized.  Oral cavity is clear. No oral mucosal lesions are identified. Neck is clear without evidence of cervical or supraclavicular adenopathy. Lungs are clear to A&P. Cardiac examination is essentially unremarkable with regular rate and rhythm without murmur rub or thrill. Abdomen is benign with no organomegaly or masses noted. Motor sensory and DTR levels are equal and symmetric in the upper and lower extremities. Cranial nerves II through XII are grossly intact. Proprioception is intact. No peripheral adenopathy or edema is identified. No motor or sensory levels are noted. Crude visual fields are within normal range.  RADIOLOGY RESULTS: Repeat CT scan of chest is scheduled for June 19  PLAN: At this time I'll review her in a CT scan in June. Should she show  excellent response to concurrent chemoradiation will go ahead with whole brain radiation to prevent microscopic residual disease recurrence. We'll see her in follow-up after her CT scan. Risks and benefits of whole brain radiation were reviewed with the patient. We'll schedule that after reviewing her CT scan. Patient continues close follow-up care with medical oncology. Patient is to call sooner with any concerns. I would like to take this opportunity to thank you for allowing me to participate in the care of your patient.Armstead Peaks., MD

## 2016-05-02 ENCOUNTER — Inpatient Hospital Stay: Payer: Managed Care, Other (non HMO)

## 2016-05-02 ENCOUNTER — Telehealth: Payer: Self-pay | Admitting: Internal Medicine

## 2016-05-02 ENCOUNTER — Telehealth: Payer: Self-pay | Admitting: *Deleted

## 2016-05-02 ENCOUNTER — Other Ambulatory Visit: Payer: Self-pay | Admitting: Internal Medicine

## 2016-05-02 ENCOUNTER — Other Ambulatory Visit: Payer: Self-pay | Admitting: *Deleted

## 2016-05-02 ENCOUNTER — Other Ambulatory Visit: Payer: Self-pay | Admitting: Oncology

## 2016-05-02 VITALS — BP 120/80 | HR 98 | Temp 96.7°F | Resp 18

## 2016-05-02 DIAGNOSIS — E876 Hypokalemia: Secondary | ICD-10-CM

## 2016-05-02 DIAGNOSIS — C3490 Malignant neoplasm of unspecified part of unspecified bronchus or lung: Secondary | ICD-10-CM | POA: Diagnosis not present

## 2016-05-02 DIAGNOSIS — C3432 Malignant neoplasm of lower lobe, left bronchus or lung: Secondary | ICD-10-CM

## 2016-05-02 DIAGNOSIS — L03211 Cellulitis of face: Secondary | ICD-10-CM

## 2016-05-02 DIAGNOSIS — C3402 Malignant neoplasm of left main bronchus: Secondary | ICD-10-CM

## 2016-05-02 LAB — CBC WITH DIFFERENTIAL/PLATELET
Basophils Absolute: 0 10*3/uL (ref 0–0.1)
Basophils Relative: 0 %
Eosinophils Absolute: 0 10*3/uL (ref 0–0.7)
HEMATOCRIT: 26.9 % — AB (ref 35.0–47.0)
HEMOGLOBIN: 9.7 g/dL — AB (ref 12.0–16.0)
LYMPHS ABS: 0.7 10*3/uL — AB (ref 1.0–3.6)
MCH: 31.8 pg (ref 26.0–34.0)
MCHC: 35.9 g/dL (ref 32.0–36.0)
MCV: 88.4 fL (ref 80.0–100.0)
MONO ABS: 0.3 10*3/uL (ref 0.2–0.9)
NEUTROS ABS: 0.3 10*3/uL — AB (ref 1.4–6.5)
Neutrophils Relative %: 22 %
Platelets: 44 10*3/uL — ABNORMAL LOW (ref 150–440)
RBC: 3.04 MIL/uL — ABNORMAL LOW (ref 3.80–5.20)
RDW: 18.9 % — AB (ref 11.5–14.5)
WBC: 1.3 10*3/uL — CL (ref 3.6–11.0)

## 2016-05-02 LAB — COMPREHENSIVE METABOLIC PANEL
ALBUMIN: 3.4 g/dL — AB (ref 3.5–5.0)
ALT: 13 U/L — AB (ref 14–54)
AST: 17 U/L (ref 15–41)
Alkaline Phosphatase: 62 U/L (ref 38–126)
Anion gap: 9 (ref 5–15)
BUN: 10 mg/dL (ref 6–20)
CHLORIDE: 96 mmol/L — AB (ref 101–111)
CO2: 30 mmol/L (ref 22–32)
CREATININE: 0.82 mg/dL (ref 0.44–1.00)
Calcium: 8.1 mg/dL — ABNORMAL LOW (ref 8.9–10.3)
GFR calc Af Amer: >60 mL/min (ref >60)
GLUCOSE: 171 mg/dL — AB (ref 65–99)
POTASSIUM: 3 mmol/L — AB (ref 3.5–5.1)
SODIUM: 135 mmol/L (ref 135–145)
Total Bilirubin: 0.6 mg/dL (ref 0.3–1.2)
Total Protein: 6.3 g/dL — ABNORMAL LOW (ref 6.5–8.1)

## 2016-05-02 LAB — MAGNESIUM: Magnesium: 1 mg/dL — ABNORMAL LOW (ref 1.7–2.4)

## 2016-05-02 MED ORDER — HEPARIN SOD (PORK) LOCK FLUSH 100 UNIT/ML IV SOLN
500.0000 [IU] | Freq: Once | INTRAVENOUS | Status: AC
  Administered 2016-05-02: 500 [IU] via INTRAVENOUS

## 2016-05-02 MED ORDER — HEPARIN SOD (PORK) LOCK FLUSH 100 UNIT/ML IV SOLN
INTRAVENOUS | Status: AC
  Filled 2016-05-02: qty 5

## 2016-05-02 MED ORDER — SODIUM CHLORIDE 0.9 % IV SOLN
Freq: Once | INTRAVENOUS | Status: AC
  Administered 2016-05-02: 10:00:00 via INTRAVENOUS
  Filled 2016-05-02: qty 20

## 2016-05-02 MED ORDER — CEPHALEXIN 500 MG PO CAPS: 500.0000 mg | ORAL_CAPSULE | Freq: Three times a day (TID) | ORAL | Status: DC

## 2016-05-02 MED ORDER — ONDANSETRON HCL 4 MG PO TABS
8.0000 mg | ORAL_TABLET | Freq: Once | ORAL | Status: AC
  Administered 2016-05-02: 8 mg via ORAL
  Filled 2016-05-02: qty 2

## 2016-05-02 MED ORDER — SODIUM CHLORIDE 0.9 % IV SOLN
Freq: Once | INTRAVENOUS | Status: AC
  Administered 2016-05-02: 10:00:00 via INTRAVENOUS
  Filled 2016-05-02: qty 1000

## 2016-05-02 NOTE — Telephone Encounter (Signed)
Have been asked to see patient because of puffiness of the left side. Vital signs stable. Patient has been neutropenic/ absolute neutrophil count 300. No obvious signs of infection. Given a prescription for Keflex in case states started to notice worsening redness. Recommend call us before starting. Recommend antihistamine eyedrops/and Claritin.

## 2016-05-02 NOTE — Telephone Encounter (Signed)
Thank you :)

## 2016-05-02 NOTE — Telephone Encounter (Signed)
915 am Received call report from [facility] in [facility] lab. ANC today is 0.3. Read back process performed with lab tech.  RN spoke with Tillie Rung, Dr. Gary Fleet nurse in [place] [facility]. Kendra notified at 930 am.  She will inform Dr. [name] of results.

## 2016-05-12 ENCOUNTER — Telehealth: Payer: Self-pay | Admitting: Unknown Physician Specialty

## 2016-05-12 NOTE — Telephone Encounter (Signed)
RX for Jill Gross was up front so I faxed it to the pharmacy. Will route to provider since patient would like to speak to her.

## 2016-05-12 NOTE — Telephone Encounter (Signed)
Pt would like to speak with PCP about progress and would also like a refill on zolpidem (AMBIEN) 10 MG tablet

## 2016-05-20 ENCOUNTER — Telehealth: Payer: Self-pay | Admitting: Unknown Physician Specialty

## 2016-05-20 MED ORDER — AMITRIPTYLINE HCL 10 MG PO TABS: 20.0000 mg | ORAL_TABLET | Freq: Every day | ORAL | Status: DC

## 2016-05-20 NOTE — Telephone Encounter (Signed)
Routing to provider. Directions on current rx in chart say 1 tablet daily but it was written for #60. Please resend rx with correct directions to [facility].

## 2016-05-20 NOTE — Telephone Encounter (Signed)
Pt called and stated that she had been taking 2 amitriptyline (ELAVIL) 10 MG tablet and is currently out and would like to have a refill sent in to [facility] [place].

## 2016-05-26 ENCOUNTER — Encounter
Admission: RE | Admit: 2016-05-26 | Discharge: 2016-05-26 | Disposition: A | Discharge status: Home / Self Care | Payer: Managed Care, Other (non HMO) | Source: Ambulatory Visit | Attending: Oncology | Admitting: Oncology

## 2016-05-26 ENCOUNTER — Ambulatory Visit
Admission: RE | Admit: 2016-05-26 | Discharge: 2016-05-26 | Disposition: A | Discharge status: Home / Self Care | Payer: Managed Care, Other (non HMO) | Source: Ambulatory Visit | Attending: Oncology | Admitting: Oncology

## 2016-05-26 DIAGNOSIS — C3432 Malignant neoplasm of lower lobe, left bronchus or lung: Secondary | ICD-10-CM | POA: Diagnosis not present

## 2016-05-26 DIAGNOSIS — R933 Abnormal findings on diagnostic imaging of other parts of digestive tract: Secondary | ICD-10-CM | POA: Diagnosis not present

## 2016-05-26 LAB — GLUCOSE, CAPILLARY: GLUCOSE-CAPILLARY: 99 mg/dL (ref 65–99)

## 2016-05-26 MED ORDER — FLUDEOXYGLUCOSE F - 18 (FDG) INJECTION
13.1900 | Freq: Once | INTRAVENOUS | Status: AC | PRN
  Administered 2016-05-26: 13.19 via INTRAVENOUS

## 2016-05-28 ENCOUNTER — Ambulatory Visit: Payer: Managed Care, Other (non HMO) | Admitting: Radiation Oncology

## 2016-05-28 ENCOUNTER — Other Ambulatory Visit: Payer: Managed Care, Other (non HMO)

## 2016-05-28 ENCOUNTER — Encounter: Payer: Self-pay | Admitting: Radiation Oncology

## 2016-05-28 ENCOUNTER — Ambulatory Visit: Payer: Managed Care, Other (non HMO) | Admitting: Oncology

## 2016-05-28 ENCOUNTER — Inpatient Hospital Stay: Payer: Managed Care, Other (non HMO)

## 2016-05-28 ENCOUNTER — Ambulatory Visit
Admission: RE | Admit: 2016-05-28 | Discharge: 2016-05-28 | Disposition: A | Discharge status: Home / Self Care | Payer: Managed Care, Other (non HMO) | Source: Ambulatory Visit | Attending: Radiation Oncology | Admitting: Radiation Oncology

## 2016-05-28 ENCOUNTER — Inpatient Hospital Stay: Payer: Managed Care, Other (non HMO) | Attending: Oncology | Admitting: Oncology

## 2016-05-28 VITALS — BP 125/82 | HR 114 | Temp 97.7°F | Resp 20 | Wt 151.0 lb

## 2016-05-28 DIAGNOSIS — E785 Hyperlipidemia, unspecified: Secondary | ICD-10-CM | POA: Diagnosis not present

## 2016-05-28 DIAGNOSIS — C3432 Malignant neoplasm of lower lobe, left bronchus or lung: Secondary | ICD-10-CM

## 2016-05-28 DIAGNOSIS — I10 Essential (primary) hypertension: Secondary | ICD-10-CM

## 2016-05-28 DIAGNOSIS — G4700 Insomnia, unspecified: Secondary | ICD-10-CM

## 2016-05-28 DIAGNOSIS — Z510 Encounter for antineoplastic radiation therapy: Secondary | ICD-10-CM | POA: Insufficient documentation

## 2016-05-28 DIAGNOSIS — E039 Hypothyroidism, unspecified: Secondary | ICD-10-CM | POA: Diagnosis not present

## 2016-05-28 DIAGNOSIS — Z87891 Personal history of nicotine dependence: Secondary | ICD-10-CM | POA: Diagnosis not present

## 2016-05-28 DIAGNOSIS — D510 Vitamin B12 deficiency anemia due to intrinsic factor deficiency: Secondary | ICD-10-CM

## 2016-05-28 DIAGNOSIS — R5383 Other fatigue: Secondary | ICD-10-CM

## 2016-05-28 DIAGNOSIS — R110 Nausea: Secondary | ICD-10-CM | POA: Diagnosis not present

## 2016-05-28 DIAGNOSIS — M545 Low back pain: Secondary | ICD-10-CM

## 2016-05-28 DIAGNOSIS — F329 Major depressive disorder, single episode, unspecified: Secondary | ICD-10-CM

## 2016-05-28 DIAGNOSIS — C3402 Malignant neoplasm of left main bronchus: Secondary | ICD-10-CM

## 2016-05-28 DIAGNOSIS — E538 Deficiency of other specified B group vitamins: Secondary | ICD-10-CM | POA: Diagnosis not present

## 2016-05-28 DIAGNOSIS — G629 Polyneuropathy, unspecified: Secondary | ICD-10-CM | POA: Diagnosis not present

## 2016-05-28 DIAGNOSIS — R05 Cough: Secondary | ICD-10-CM | POA: Insufficient documentation

## 2016-05-28 DIAGNOSIS — J449 Chronic obstructive pulmonary disease, unspecified: Secondary | ICD-10-CM

## 2016-05-28 DIAGNOSIS — R531 Weakness: Secondary | ICD-10-CM | POA: Diagnosis not present

## 2016-05-28 DIAGNOSIS — M8580 Other specified disorders of bone density and structure, unspecified site: Secondary | ICD-10-CM | POA: Diagnosis not present

## 2016-05-28 DIAGNOSIS — R1310 Dysphagia, unspecified: Secondary | ICD-10-CM

## 2016-05-28 DIAGNOSIS — N952 Postmenopausal atrophic vaginitis: Secondary | ICD-10-CM

## 2016-05-28 DIAGNOSIS — F419 Anxiety disorder, unspecified: Secondary | ICD-10-CM

## 2016-05-28 LAB — CBC WITH DIFFERENTIAL/PLATELET
BASOS ABS: 0 10*3/uL (ref 0–0.1)
BASOS PCT: 1 %
Eosinophils Absolute: 0.2 10*3/uL (ref 0–0.7)
Eosinophils Relative: 3 %
HEMATOCRIT: 29.6 % — AB (ref 35.0–47.0)
Hemoglobin: 10.2 g/dL — ABNORMAL LOW (ref 12.0–16.0)
LYMPHS PCT: 18 %
Lymphs Abs: 1.2 10*3/uL (ref 1.0–3.6)
MCH: 32.8 pg (ref 26.0–34.0)
MCHC: 34.4 g/dL (ref 32.0–36.0)
MCV: 95.1 fL (ref 80.0–100.0)
Monocytes Absolute: 0.5 10*3/uL (ref 0.2–0.9)
Monocytes Relative: 8 %
NEUTROS ABS: 4.5 10*3/uL (ref 1.4–6.5)
NEUTROS PCT: 70 %
Platelets: 200 10*3/uL (ref 150–440)
RBC: 3.11 MIL/uL — AB (ref 3.80–5.20)
RDW: 21.4 % — ABNORMAL HIGH (ref 11.5–14.5)
WBC: 6.3 10*3/uL (ref 3.6–11.0)

## 2016-05-28 LAB — COMPREHENSIVE METABOLIC PANEL
ALBUMIN: 4 g/dL (ref 3.5–5.0)
ALT: 13 U/L — AB (ref 14–54)
AST: 24 U/L (ref 15–41)
Alkaline Phosphatase: 74 U/L (ref 38–126)
Anion gap: 8 (ref 5–15)
BILIRUBIN TOTAL: 0.6 mg/dL (ref 0.3–1.2)
BUN: 8 mg/dL (ref 6–20)
CHLORIDE: 100 mmol/L — AB (ref 101–111)
CO2: 26 mmol/L (ref 22–32)
CREATININE: 1.03 mg/dL — AB (ref 0.44–1.00)
Calcium: 9.1 mg/dL (ref 8.9–10.3)
GFR calc Af Amer: >60 mL/min (ref >60)
GFR, EST NON AFRICAN AMERICAN: 58 mL/min — AB (ref >60)
GLUCOSE: 150 mg/dL — AB (ref 65–99)
Potassium: 3.7 mmol/L (ref 3.5–5.1)
Sodium: 134 mmol/L — ABNORMAL LOW (ref 135–145)
TOTAL PROTEIN: 7.6 g/dL (ref 6.5–8.1)

## 2016-05-28 MED ORDER — GABAPENTIN 300 MG PO CAPS: 300.0000 mg | ORAL_CAPSULE | Freq: Every day | ORAL | Status: DC

## 2016-05-28 NOTE — Progress Notes (Signed)
Jill Gross  [phone] [phone]  ID: TONDALAYA PERREN OB: Mar 14, 1956  MR#: [id number]  GMW#:[id number]  Patient Care Team: [name], NP as PCP - General (Nurse Practitioner)  CHIEF COMPLAINT: Small cell lung cancer. Chief Complaint  Patient presents with  . Lung Cancer    Follow up    INTERVAL HISTORY: Patient Returns to clinic today for further evaluation and discussion of her PET scan results. She continues to be weak and fatigued, but admits this has improved. She also has continued neuropathy particularly in her fingertips. She has no other neurologic complaint. She denies any recent fevers. Her appetite has improved. She denies any chest pain, shortness of breath, cough, or hemoptysis. She denies any nausea, vomiting, constipation, or diarrhea. She has no urinary complaints. Patient otherwise feels well and offers no further specific complaints.   REVIEW OF SYSTEMS:   Review of Systems  Constitutional: Positive for malaise/fatigue. Negative for fever and weight loss.  HENT: Negative for sore throat.   Eyes: Negative.   Respiratory: Negative for cough, hemoptysis and shortness of breath.   Cardiovascular: Negative.  Negative for chest pain.  Gastrointestinal: Negative for nausea and vomiting.  Genitourinary: Negative.   Musculoskeletal: Negative.   Skin: Negative.   Neurological: Positive for sensory change and weakness. Negative for dizziness and tingling.    As per HPI. Otherwise, a complete review of systems is negatve.  PAST MEDICAL HISTORY: Past Medical History  Diagnosis Date  . Anemia     pernicious  . Hypothyroid   . Osteopenia   . Insomnia   . Allergy   . COPD (chronic obstructive pulmonary disease) ([place])   . Depression   . Atrophic vaginitis   . Low back pain   . Fatigue   . Lumbago   . Tobacco use   . Hyperlipidemia   . Vitamin B12 deficiency   . Anxiety     PAST SURGICAL HISTORY: Past Surgical  History  Procedure Laterality Date  . Cholecystectomy    . Abdominal hysterectomy    . Hernia repair    . Ankle surgery      x4  . Carpal tunnel release  April 2015  . Endobronchial ultrasound N/A 01/07/2016    Procedure: ENDOBRONCHIAL ULTRASOUND;  Surgeon: Vilinda Boehringer, MD;  Location: [facility] ORS;  Service: Cardiopulmonary;  Laterality: N/A;  . Portacath placement Right 01/23/2016    Procedure: INSERTION PORT-A-CATH;  Surgeon: [name], MD;  Location: [facility] ORS;  Service: General;  Laterality: Right;    FAMILY HISTORY Family History  Problem Relation Age of Onset  . Emphysema Mother   . Diabetes Father        ADVANCED DIRECTIVES:    HEALTH MAINTENANCE: Social History  Substance Use Topics  . Smoking status: Former Smoker -- 1.00 packs/day    Types: Cigarettes  . Smokeless tobacco: Never Used  . Alcohol Use: No     Allergies  Allergen Reactions  . Skelaxin [Metaxalone] Anaphylaxis  . Effexor [Venlafaxine]   . Hctz [Hydrochlorothiazide] Other (See Comments)    cramps  . Macrobid [Nitrofurantoin]   . Neurontin [Gabapentin]   . Paxil [Paroxetine Hcl]   . Pravachol [Pravastatin Sodium] Other (See Comments)    aching  . Prozac [Fluoxetine Hcl]   . Sulfur   . Wellbutrin [Bupropion]   . Zoloft [Sertraline Hcl]     Current Outpatient Prescriptions  Medication Sig Dispense Refill  . albuterol (PROAIR HFA) 108 (90 Base) MCG/ACT inhaler Inhale 2 puffs  into the lungs every 4 (four) hours as needed for wheezing or shortness of breath. 8.5 g 1  . amitriptyline (ELAVIL) 10 MG tablet Take 2 tablets (20 mg total) by mouth at bedtime. 60 tablet 12  . cephALEXin (KEFLEX) 500 MG capsule Take 1 capsule (500 mg total) by mouth 3 (three) times daily. Do not start until you feel redness is worse/call MD before starting. 30 capsule 0  . cimetidine (TAGAMET) 200 MG tablet Take 150 mg by mouth at bedtime.     . cyanocobalamin (,VITAMIN B-12,) 1000 MCG/ML injection Inject 1 mL (1,000 mcg  total) into the muscle once. 10 mL 1  . escitalopram (LEXAPRO) 20 MG tablet Take 1 tablet (20 mg total) by mouth daily. 30 tablet 6  . Gadopentetate Dimeglumine (MAGNEVIST IV) Inject into the vein.    Marland Kitchen levothyroxine (SYNTHROID, LEVOTHROID) 137 MCG tablet Take 1 tablet (137 mcg total) by mouth daily before breakfast. 30 tablet 12  . lidocaine-prilocaine (EMLA) cream Apply to affected area once 30 g 3  . LORazepam (ATIVAN) 0.5 MG tablet Take 1 tablet (0.5 mg total) by mouth every 8 (eight) hours as needed for anxiety. 30 tablet 0  . ondansetron (ZOFRAN) 8 MG tablet Take 1 tablet (8 mg total) by mouth 2 (two) times daily as needed. Start on the third day after cisplatin chemotherapy. 30 tablet 1  . potassium chloride (KLOR-CON) 20 MEQ packet Take 20 mEq by mouth 2 (two) times daily. 30 packet 2  . potassium chloride, magnesium sulfate in sodium chloride 0.9 % 250 mL Inject into the vein.    Marland Kitchen prochlorperazine (COMPAZINE) 10 MG tablet Take 1 tablet (10 mg total) by mouth every 6 (six) hours as needed (Nausea or vomiting). 30 tablet 1  . promethazine (PHENERGAN) 25 MG tablet Take 1 tablet (25 mg total) by mouth every 6 (six) hours as needed for nausea or vomiting. 30 tablet 0  . simvastatin (ZOCOR) 10 MG tablet Take 1 tablet (10 mg total) by mouth daily at 6 PM. 30 tablet 6  . sucralfate (CARAFATE) 1 g tablet Take 1 tablet (1 g total) by mouth 3 (three) times daily. 90 tablet 3  . zolpidem (AMBIEN) 10 MG tablet Take 1 tablet (10 mg total) by mouth once. 30 tablet 5  . gabapentin (NEURONTIN) 300 MG capsule Take 1 capsule (300 mg total) by mouth at bedtime. 30 capsule 2   No current facility-administered medications for this visit.    OBJECTIVE: Filed Vitals:   05/28/16 1042  BP: 125/82  Pulse: 114  Temp: 97.7 F (36.5 C)  Resp: 20     Body mass index is 25.91 kg/(m^2).    ECOG FS:1 - Symptomatic but completely ambulatory  General: Well-developed, well-nourished. Sitting in wheelchair Eyes:  Pink conjunctiva, anicteric sclera. HEENT: Oropharynx clear without erythema or exudate. Lungs: Clear to auscultation bilaterally. Heart: Tachycardia. No rubs, murmurs, or gallops. Musculoskeletal: No edema, cyanosis, or clubbing. Neuro: Alert, answering all questions appropriately. Cranial nerves grossly intact. Skin: No rashes or petechiae noted. Poor skin turgor. Psych: Normal affect.   LAB RESULTS:  Lab Results  Component Value Date   NA 134* 05/28/2016   K 3.7 05/28/2016   CL 100* 05/28/2016   CO2 26 05/28/2016   GLUCOSE 150* 05/28/2016   BUN 8 05/28/2016   CREATININE 1.03* 05/28/2016   CALCIUM 9.1 05/28/2016   PROT 7.6 05/28/2016   ALBUMIN 4.0 05/28/2016   AST 24 05/28/2016   ALT 13* 05/28/2016   ALKPHOS  74 05/28/2016   BILITOT 0.6 05/28/2016   GFRNONAA 58* 05/28/2016   GFRAA >60 05/28/2016    Lab Results  Component Value Date   WBC 6.3 05/28/2016   NEUTROABS 4.5 05/28/2016   HGB 10.2* 05/28/2016   HCT 29.6* 05/28/2016   MCV 95.1 05/28/2016   PLT 200 05/28/2016     STUDIES: Nm Pet Image Restag (ps) Skull Base To Thigh  05/26/2016  CLINICAL DATA:  Subsequent treatment strategy for left lower lobe lung cancer. Chemotherapy and radiation therapy completed 1 month ago. EXAM: NUCLEAR MEDICINE PET SKULL BASE TO THIGH TECHNIQUE: 13.19 mCi F-18 FDG was injected intravenously. Full-ring PET imaging was performed from the skull base to thigh after the radiotracer. CT data was obtained and used for attenuation correction and anatomic localization. FASTING BLOOD GLUCOSE:  Value: 99 mg/dl COMPARISON:  Chest CT 01/02/2016.  PET-CT 01/22/2016. FINDINGS: NECK No hypermetabolic cervical lymph nodes are identified.There are no lesions of the pharyngeal mucosal space. CHEST The left perihilar hypermetabolic activity noted on the prior examination has nearly completely resolved. There is minimal residual soft tissue thickening extending inferiorly from the left hilum, demonstrating an  SUV max of 3.6. There is interval near complete re-expansion of the left lower lobe. There is some residual linear atelectasis or scarring in the left lower lobe, but no discrete lung mass or hypermetabolic pulmonary activity. There are no hypermetabolic mediastinal, hilar or axillary lymph nodes. A small pericardial effusion is present. ABDOMEN/PELVIS There is no hypermetabolic activity within the liver, adrenal glands, spleen or pancreas. There is no hypermetabolic nodal activity. There is a questionable small focus of hypermetabolic activity along the medial wall of the cecum demonstrating an SUV max of 8.0. There is no clear corresponding finding on the CT images. SKELETON There is no hypermetabolic activity to suggest osseous metastatic disease. IMPRESSION: 1. Near-complete resolution of previously demonstrated hypermetabolic activity in the left lower lobe and left hilum consistent with response to therapy. No discrete measurable residual disease identified. 2. No evidence of distant metastasis. 3. Indeterminate small focus of hypermetabolic activity involving the medial wall of the cecum. This may be physiologic, although is potentially secondary to a small polyp. Further evaluation should be considered if the patient has not had routine colonoscopy. Electronically Signed   By: Richardean Sale M.D.   On: 05/26/2016 12:35    ASSESSMENT: Clinical stage IIa, T2 N1 M0 small cell lung cancer.  PLAN:    1. Small cell lung cancer: PET scan results from May 26, 2016 reviewed independently and reported as above with near complete resolution of known malignancy. No further intervention is needed. Return to clinic in 3 months with repeat imaging with CT scan and further evaluation. Patient will also require PCI in the near future.  2. Hypotension/dizziness: Resolved.  3. Nausea:  Resolved.  4. Dysphagia: Resolved. 5. Hypokalemia: Potassium now within normal limits continue oral supplementation as  prescribed. 6. Peripheral neuropathy: Patient was given a prescription for gabapentin 300 mg at night. She has been instructed to call clinic if she needs to increase her dose or frequency. 7. Anemia: Significantly improved, monitor.  Patient expressed understanding and was in agreement with this plan. She also understands that She can call clinic at any time with any questions, concerns, or complaints.    Jill Huger, MD   05/28/2016 11:27 AM

## 2016-05-28 NOTE — Progress Notes (Signed)
Patient here today for routine follow up regarding lung cancer, PET results. Patient reports cough, back pain and worsening neuropathy in hands and feet.

## 2016-05-28 NOTE — Progress Notes (Signed)
Radiation Oncology Follow up Note  Name: Jill Gross   Date:   05/28/2016 MRN:  [id number] DOB: 1956/11/10    This 60 y.o. female presents to the clinic today for follow-up for limited stage small cell lung cancer status post chemotherapy as well as consolidative radiation therapy to chest.  REFERRING PROVIDER: [name], NP  HPI: Patient is a 60 year old female who is now completed her chemotherapy as well as consolidative radiation therapy to her left lung for limited stage small cell lung cancer.. She is seen today in follow-up and is doing well. She has a slight nonproductive cough. No change in neurologic status. No dysphagia. Recent PET CT scan shows near complete resolution of previously demonstrated hypermetabolic activity in the left lower lobe and left hilum consistent with response. There was no evidence of distant disease. She seen today for consideration of whole brain radiation therapy.  COMPLICATIONS OF TREATMENT: none  FOLLOW UP COMPLIANCE: keeps appointments   PHYSICAL EXAM:  LMP  (LMP Unknown) Well-developed well-nourished patient in NAD. HEENT reveals PERLA, EOMI, discs not visualized.  Oral cavity is clear. No oral mucosal lesions are identified. Neck is clear without evidence of cervical or supraclavicular adenopathy. Lungs are clear to A&P. Cardiac examination is essentially unremarkable with regular rate and rhythm without murmur rub or thrill. Abdomen is benign with no organomegaly or masses noted. Motor sensory and DTR levels are equal and symmetric in the upper and lower extremities. Cranial nerves II through XII are grossly intact. Proprioception is intact. No peripheral adenopathy or edema is identified. No motor or sensory levels are noted. Crude visual fields are within normal range.  RADIOLOGY RESULTS: Most recent PET CT scan is reviewed and compatible with the above-stated findings  PLAN: At this time patient had complete response from combined modality  treatment. I would offer whole brain radiation therapy 3060 cGy in 17 fractions to prevent brain failure. Risks and benefits of treatment including possible cognitive decline, skin reaction, loss of hair, alteration of blood counts all were described in detail to both the patient and her husband. They both seem to compress my treatment plan well. I have set her up for CT simulation a couple weeks to allow her to further recuperate from combined modality treatment.  I would like to take this opportunity to thank you for allowing me to participate in the care of your patient.Armstead Peaks., MD

## 2016-05-29 ENCOUNTER — Ambulatory Visit: Payer: Managed Care, Other (non HMO) | Admitting: Oncology

## 2016-05-29 ENCOUNTER — Ambulatory Visit: Payer: Managed Care, Other (non HMO) | Admitting: Radiation Oncology

## 2016-05-29 ENCOUNTER — Inpatient Hospital Stay
Admission: RE | Admit: 2016-05-29 | Payer: Managed Care, Other (non HMO) | Source: Ambulatory Visit | Admitting: Radiation Oncology

## 2016-05-29 ENCOUNTER — Other Ambulatory Visit: Payer: Managed Care, Other (non HMO)

## 2016-06-19 ENCOUNTER — Ambulatory Visit
Admission: RE | Admit: 2016-06-19 | Discharge: 2016-06-19 | Disposition: A | Discharge status: Home / Self Care | Payer: Managed Care, Other (non HMO) | Source: Ambulatory Visit | Attending: Radiation Oncology | Admitting: Radiation Oncology

## 2016-06-19 ENCOUNTER — Inpatient Hospital Stay: Payer: Managed Care, Other (non HMO) | Attending: Oncology

## 2016-06-19 DIAGNOSIS — C801 Malignant (primary) neoplasm, unspecified: Secondary | ICD-10-CM

## 2016-06-19 DIAGNOSIS — C3432 Malignant neoplasm of lower lobe, left bronchus or lung: Secondary | ICD-10-CM | POA: Insufficient documentation

## 2016-06-19 DIAGNOSIS — Z452 Encounter for adjustment and management of vascular access device: Secondary | ICD-10-CM | POA: Diagnosis not present

## 2016-06-19 MED ORDER — SODIUM CHLORIDE 0.9% FLUSH
10.0000 mL | INTRAVENOUS | Status: DC | PRN
  Administered 2016-06-19: 10 mL via INTRAVENOUS
  Filled 2016-06-19: qty 10

## 2016-06-19 MED ORDER — HEPARIN SOD (PORK) LOCK FLUSH 100 UNIT/ML IV SOLN
500.0000 [IU] | Freq: Once | INTRAVENOUS | Status: AC
  Administered 2016-06-19: 500 [IU] via INTRAVENOUS
  Filled 2016-06-19: qty 5

## 2016-06-20 ENCOUNTER — Other Ambulatory Visit: Payer: Self-pay | Admitting: *Deleted

## 2016-06-20 DIAGNOSIS — C3432 Malignant neoplasm of lower lobe, left bronchus or lung: Secondary | ICD-10-CM

## 2016-06-23 ENCOUNTER — Other Ambulatory Visit: Payer: Self-pay | Admitting: *Deleted

## 2016-06-23 DIAGNOSIS — C3432 Malignant neoplasm of lower lobe, left bronchus or lung: Secondary | ICD-10-CM

## 2016-06-23 MED ORDER — DEXAMETHASONE 4 MG PO TABS: 4.0000 mg | ORAL_TABLET | Freq: Every day | ORAL | Status: DC

## 2016-06-24 ENCOUNTER — Other Ambulatory Visit: Payer: Self-pay | Admitting: *Deleted

## 2016-06-24 ENCOUNTER — Ambulatory Visit
Admission: RE | Admit: 2016-06-24 | Discharge: 2016-06-24 | Disposition: A | Discharge status: Home / Self Care | Payer: Managed Care, Other (non HMO) | Source: Ambulatory Visit | Attending: Radiation Oncology | Admitting: Radiation Oncology

## 2016-06-24 DIAGNOSIS — C3432 Malignant neoplasm of lower lobe, left bronchus or lung: Secondary | ICD-10-CM | POA: Diagnosis not present

## 2016-06-24 MED ORDER — LANSOPRAZOLE 30 MG PO CPDR: 30.0000 mg | DELAYED_RELEASE_CAPSULE | Freq: Every day | ORAL | Status: DC

## 2016-06-25 ENCOUNTER — Ambulatory Visit: Payer: Managed Care, Other (non HMO)

## 2016-06-26 ENCOUNTER — Ambulatory Visit
Admission: RE | Admit: 2016-06-26 | Discharge: 2016-06-26 | Disposition: A | Discharge status: Home / Self Care | Payer: Managed Care, Other (non HMO) | Source: Ambulatory Visit | Attending: Radiation Oncology | Admitting: Radiation Oncology

## 2016-06-26 DIAGNOSIS — C3432 Malignant neoplasm of lower lobe, left bronchus or lung: Secondary | ICD-10-CM | POA: Diagnosis not present

## 2016-06-27 ENCOUNTER — Ambulatory Visit
Admission: RE | Admit: 2016-06-27 | Discharge: 2016-06-27 | Disposition: A | Discharge status: Home / Self Care | Payer: Managed Care, Other (non HMO) | Source: Ambulatory Visit | Attending: Radiation Oncology | Admitting: Radiation Oncology

## 2016-06-27 DIAGNOSIS — C3432 Malignant neoplasm of lower lobe, left bronchus or lung: Secondary | ICD-10-CM | POA: Diagnosis not present

## 2016-06-30 ENCOUNTER — Ambulatory Visit
Admission: RE | Admit: 2016-06-30 | Discharge: 2016-06-30 | Disposition: A | Discharge status: Home / Self Care | Payer: Managed Care, Other (non HMO) | Source: Ambulatory Visit | Attending: Radiation Oncology | Admitting: Radiation Oncology

## 2016-06-30 DIAGNOSIS — C3432 Malignant neoplasm of lower lobe, left bronchus or lung: Secondary | ICD-10-CM | POA: Diagnosis not present

## 2016-07-01 ENCOUNTER — Ambulatory Visit
Admission: RE | Admit: 2016-07-01 | Discharge: 2016-07-01 | Disposition: A | Discharge status: Home / Self Care | Payer: Managed Care, Other (non HMO) | Source: Ambulatory Visit | Attending: Radiation Oncology | Admitting: Radiation Oncology

## 2016-07-01 DIAGNOSIS — C3432 Malignant neoplasm of lower lobe, left bronchus or lung: Secondary | ICD-10-CM | POA: Diagnosis not present

## 2016-07-02 ENCOUNTER — Ambulatory Visit
Admission: RE | Admit: 2016-07-02 | Discharge: 2016-07-02 | Disposition: A | Discharge status: Home / Self Care | Payer: Managed Care, Other (non HMO) | Source: Ambulatory Visit | Attending: Radiation Oncology | Admitting: Radiation Oncology

## 2016-07-02 ENCOUNTER — Inpatient Hospital Stay: Payer: Managed Care, Other (non HMO)

## 2016-07-02 DIAGNOSIS — C3432 Malignant neoplasm of lower lobe, left bronchus or lung: Secondary | ICD-10-CM

## 2016-07-02 LAB — CBC
HEMATOCRIT: 31.9 % — AB (ref 35.0–47.0)
Hemoglobin: 11.2 g/dL — ABNORMAL LOW (ref 12.0–16.0)
MCH: 34.5 pg — AB (ref 26.0–34.0)
MCHC: 35.2 g/dL (ref 32.0–36.0)
MCV: 98.1 fL (ref 80.0–100.0)
Platelets: 198 10*3/uL (ref 150–440)
RBC: 3.25 MIL/uL — AB (ref 3.80–5.20)
RDW: 16.9 % — ABNORMAL HIGH (ref 11.5–14.5)
WBC: 9.9 10*3/uL (ref 3.6–11.0)

## 2016-07-03 ENCOUNTER — Ambulatory Visit
Admission: RE | Admit: 2016-07-03 | Discharge: 2016-07-03 | Disposition: A | Discharge status: Home / Self Care | Payer: Managed Care, Other (non HMO) | Source: Ambulatory Visit | Attending: Radiation Oncology | Admitting: Radiation Oncology

## 2016-07-03 DIAGNOSIS — C3432 Malignant neoplasm of lower lobe, left bronchus or lung: Secondary | ICD-10-CM | POA: Diagnosis not present

## 2016-07-04 ENCOUNTER — Ambulatory Visit
Admission: RE | Admit: 2016-07-04 | Discharge: 2016-07-04 | Disposition: A | Discharge status: Home / Self Care | Payer: Managed Care, Other (non HMO) | Source: Ambulatory Visit | Attending: Radiation Oncology | Admitting: Radiation Oncology

## 2016-07-04 DIAGNOSIS — C3432 Malignant neoplasm of lower lobe, left bronchus or lung: Secondary | ICD-10-CM | POA: Diagnosis not present

## 2016-07-07 ENCOUNTER — Ambulatory Visit
Admission: RE | Admit: 2016-07-07 | Discharge: 2016-07-07 | Disposition: A | Discharge status: Home / Self Care | Payer: Managed Care, Other (non HMO) | Source: Ambulatory Visit | Attending: Radiation Oncology | Admitting: Radiation Oncology

## 2016-07-07 DIAGNOSIS — C3432 Malignant neoplasm of lower lobe, left bronchus or lung: Secondary | ICD-10-CM | POA: Diagnosis not present

## 2016-07-08 ENCOUNTER — Ambulatory Visit
Admission: RE | Admit: 2016-07-08 | Discharge: 2016-07-08 | Disposition: A | Discharge status: Home / Self Care | Payer: Managed Care, Other (non HMO) | Source: Ambulatory Visit | Attending: Radiation Oncology | Admitting: Radiation Oncology

## 2016-07-08 DIAGNOSIS — C3432 Malignant neoplasm of lower lobe, left bronchus or lung: Secondary | ICD-10-CM | POA: Diagnosis not present

## 2016-07-09 ENCOUNTER — Inpatient Hospital Stay: Payer: Managed Care, Other (non HMO) | Attending: Oncology

## 2016-07-09 ENCOUNTER — Ambulatory Visit
Admission: RE | Admit: 2016-07-09 | Discharge: 2016-07-09 | Disposition: A | Discharge status: Home / Self Care | Payer: Managed Care, Other (non HMO) | Source: Ambulatory Visit | Attending: Radiation Oncology | Admitting: Radiation Oncology

## 2016-07-09 ENCOUNTER — Other Ambulatory Visit: Payer: Self-pay

## 2016-07-09 DIAGNOSIS — C3432 Malignant neoplasm of lower lobe, left bronchus or lung: Secondary | ICD-10-CM

## 2016-07-09 DIAGNOSIS — Z452 Encounter for adjustment and management of vascular access device: Secondary | ICD-10-CM | POA: Insufficient documentation

## 2016-07-09 LAB — CBC
HCT: 33.9 % — ABNORMAL LOW (ref 35.0–47.0)
HEMOGLOBIN: 11.7 g/dL — AB (ref 12.0–16.0)
MCH: 34.6 pg — AB (ref 26.0–34.0)
MCHC: 34.5 g/dL (ref 32.0–36.0)
MCV: 100.3 fL — AB (ref 80.0–100.0)
Platelets: 180 10*3/uL (ref 150–440)
RBC: 3.39 MIL/uL — AB (ref 3.80–5.20)
RDW: 17.1 % — ABNORMAL HIGH (ref 11.5–14.5)
WBC: 10.5 10*3/uL (ref 3.6–11.0)

## 2016-07-10 ENCOUNTER — Ambulatory Visit
Admission: RE | Admit: 2016-07-10 | Discharge: 2016-07-10 | Disposition: A | Discharge status: Home / Self Care | Payer: Managed Care, Other (non HMO) | Source: Ambulatory Visit | Attending: Radiation Oncology | Admitting: Radiation Oncology

## 2016-07-10 DIAGNOSIS — C3432 Malignant neoplasm of lower lobe, left bronchus or lung: Secondary | ICD-10-CM | POA: Diagnosis not present

## 2016-07-11 ENCOUNTER — Ambulatory Visit
Admission: RE | Admit: 2016-07-11 | Discharge: 2016-07-11 | Disposition: A | Discharge status: Home / Self Care | Payer: Managed Care, Other (non HMO) | Source: Ambulatory Visit | Attending: Radiation Oncology | Admitting: Radiation Oncology

## 2016-07-11 DIAGNOSIS — C3432 Malignant neoplasm of lower lobe, left bronchus or lung: Secondary | ICD-10-CM | POA: Diagnosis not present

## 2016-07-14 ENCOUNTER — Ambulatory Visit
Admission: RE | Admit: 2016-07-14 | Discharge: 2016-07-14 | Disposition: A | Discharge status: Home / Self Care | Payer: Managed Care, Other (non HMO) | Source: Ambulatory Visit | Attending: Radiation Oncology | Admitting: Radiation Oncology

## 2016-07-14 DIAGNOSIS — C3432 Malignant neoplasm of lower lobe, left bronchus or lung: Secondary | ICD-10-CM | POA: Diagnosis not present

## 2016-07-15 ENCOUNTER — Ambulatory Visit
Admission: RE | Admit: 2016-07-15 | Discharge: 2016-07-15 | Disposition: A | Discharge status: Home / Self Care | Payer: Managed Care, Other (non HMO) | Source: Ambulatory Visit | Attending: Radiation Oncology | Admitting: Radiation Oncology

## 2016-07-15 DIAGNOSIS — C3432 Malignant neoplasm of lower lobe, left bronchus or lung: Secondary | ICD-10-CM | POA: Diagnosis not present

## 2016-07-16 ENCOUNTER — Ambulatory Visit: Payer: Managed Care, Other (non HMO)

## 2016-07-16 DIAGNOSIS — C3432 Malignant neoplasm of lower lobe, left bronchus or lung: Secondary | ICD-10-CM | POA: Diagnosis not present

## 2016-07-17 ENCOUNTER — Ambulatory Visit: Payer: Managed Care, Other (non HMO)

## 2016-07-18 ENCOUNTER — Ambulatory Visit: Payer: Managed Care, Other (non HMO)

## 2016-07-21 MED ORDER — HEPARIN SOD (PORK) LOCK FLUSH 100 UNIT/ML IV SOLN
INTRAVENOUS | Status: AC
  Filled 2016-07-21: qty 5

## 2016-07-28 ENCOUNTER — Telehealth: Payer: Self-pay | Admitting: *Deleted

## 2016-07-28 MED ORDER — METHYLPREDNISOLONE 4 MG PO TBPK: ORAL_TABLET | ORAL | 0 refills | Status: DC

## 2016-07-28 NOTE — Telephone Encounter (Signed)
Medrol dose pak order per VO Dr [name]

## 2016-07-28 NOTE — Telephone Encounter (Signed)
Asking for an appt to be seen for Breathing problems, SOB, Wheezing and chest pain. Please advise

## 2016-07-28 NOTE — Telephone Encounter (Signed)
Per Dr [name] She needs to see her pulmonologist. Pt called and states she does not have a pulmonologist. She also reports that she has had this for 3 weeks and that Dr Donella Stade said it is probably scar tissue and that her lungs are clear. She then said she also has a cough and that she is having sharp pains in her breast, not her chest

## 2016-07-31 ENCOUNTER — Inpatient Hospital Stay: Payer: Managed Care, Other (non HMO)

## 2016-07-31 DIAGNOSIS — C3432 Malignant neoplasm of lower lobe, left bronchus or lung: Secondary | ICD-10-CM | POA: Diagnosis not present

## 2016-07-31 DIAGNOSIS — C801 Malignant (primary) neoplasm, unspecified: Secondary | ICD-10-CM

## 2016-07-31 MED ORDER — HEPARIN SOD (PORK) LOCK FLUSH 100 UNIT/ML IV SOLN
500.0000 [IU] | INTRAVENOUS | Status: AC | PRN
  Administered 2016-07-31: 500 [IU]

## 2016-07-31 MED ORDER — SODIUM CHLORIDE 0.9% FLUSH
10.0000 mL | INTRAVENOUS | Status: AC | PRN
  Administered 2016-07-31: 10 mL
  Filled 2016-07-31: qty 10

## 2016-08-15 ENCOUNTER — Telehealth: Payer: Self-pay | Admitting: *Deleted

## 2016-08-15 DIAGNOSIS — F419 Anxiety disorder, unspecified: Secondary | ICD-10-CM

## 2016-08-15 MED ORDER — LORAZEPAM 0.5 MG PO TABS
0.5000 mg | ORAL_TABLET | Freq: Three times a day (TID) | ORAL | 0 refills | Status: DC | PRN
Start: 2016-08-15 — End: 2016-08-18

## 2016-08-15 NOTE — Telephone Encounter (Signed)
Refill for lorazepam faxed to [facility]

## 2016-08-18 ENCOUNTER — Other Ambulatory Visit: Payer: Self-pay | Admitting: *Deleted

## 2016-08-18 ENCOUNTER — Telehealth: Payer: Self-pay | Admitting: *Deleted

## 2016-08-18 DIAGNOSIS — F419 Anxiety disorder, unspecified: Secondary | ICD-10-CM

## 2016-08-18 MED ORDER — LORAZEPAM 0.5 MG PO TABS: 0.5000 mg | ORAL_TABLET | Freq: Three times a day (TID) | ORAL | 0 refills | Status: DC | PRN

## 2016-08-18 NOTE — Telephone Encounter (Signed)
Notified patient of medication refill for Ativan.

## 2016-08-26 ENCOUNTER — Inpatient Hospital Stay: Payer: Managed Care, Other (non HMO)

## 2016-08-26 ENCOUNTER — Ambulatory Visit: Admission: RE | Admit: 2016-08-26 | Payer: Managed Care, Other (non HMO) | Source: Ambulatory Visit

## 2016-08-27 ENCOUNTER — Telehealth: Payer: Self-pay | Admitting: *Deleted

## 2016-08-27 NOTE — Telephone Encounter (Signed)
Called to inquire who she is to see when she gets sick since she has completed treatments.She has a persistent cough and needs to be seen. Please advise

## 2016-08-27 NOTE — Telephone Encounter (Signed)
PCP or urgent care please.

## 2016-08-27 NOTE — Telephone Encounter (Signed)
Notified Mr Spano to contact PCP, or Urgent Care.

## 2016-08-28 ENCOUNTER — Ambulatory Visit: Payer: Managed Care, Other (non HMO) | Admitting: Radiation Oncology

## 2016-08-28 ENCOUNTER — Inpatient Hospital Stay: Payer: Managed Care, Other (non HMO) | Admitting: Oncology

## 2016-08-29 ENCOUNTER — Other Ambulatory Visit: Payer: Self-pay

## 2016-08-29 ENCOUNTER — Ambulatory Visit
Admission: RE | Admit: 2016-08-29 | Discharge: 2016-08-29 | Disposition: A | Discharge status: Home / Self Care | Payer: Managed Care, Other (non HMO) | Source: Ambulatory Visit | Attending: Oncology | Admitting: Oncology

## 2016-08-29 ENCOUNTER — Inpatient Hospital Stay: Payer: Managed Care, Other (non HMO) | Attending: Oncology

## 2016-08-29 DIAGNOSIS — R531 Weakness: Secondary | ICD-10-CM | POA: Insufficient documentation

## 2016-08-29 DIAGNOSIS — C3432 Malignant neoplasm of lower lobe, left bronchus or lung: Secondary | ICD-10-CM | POA: Insufficient documentation

## 2016-08-29 DIAGNOSIS — J449 Chronic obstructive pulmonary disease, unspecified: Secondary | ICD-10-CM | POA: Diagnosis not present

## 2016-08-29 DIAGNOSIS — F329 Major depressive disorder, single episode, unspecified: Secondary | ICD-10-CM | POA: Diagnosis not present

## 2016-08-29 DIAGNOSIS — Z923 Personal history of irradiation: Secondary | ICD-10-CM | POA: Diagnosis not present

## 2016-08-29 DIAGNOSIS — R0602 Shortness of breath: Secondary | ICD-10-CM | POA: Insufficient documentation

## 2016-08-29 DIAGNOSIS — K229 Disease of esophagus, unspecified: Secondary | ICD-10-CM | POA: Insufficient documentation

## 2016-08-29 DIAGNOSIS — M8580 Other specified disorders of bone density and structure, unspecified site: Secondary | ICD-10-CM | POA: Insufficient documentation

## 2016-08-29 DIAGNOSIS — R05 Cough: Secondary | ICD-10-CM | POA: Insufficient documentation

## 2016-08-29 DIAGNOSIS — E039 Hypothyroidism, unspecified: Secondary | ICD-10-CM | POA: Insufficient documentation

## 2016-08-29 DIAGNOSIS — J90 Pleural effusion, not elsewhere classified: Secondary | ICD-10-CM | POA: Insufficient documentation

## 2016-08-29 DIAGNOSIS — R630 Anorexia: Secondary | ICD-10-CM | POA: Insufficient documentation

## 2016-08-29 DIAGNOSIS — Z79899 Other long term (current) drug therapy: Secondary | ICD-10-CM | POA: Insufficient documentation

## 2016-08-29 DIAGNOSIS — I700 Atherosclerosis of aorta: Secondary | ICD-10-CM | POA: Diagnosis not present

## 2016-08-29 DIAGNOSIS — G545 Neuralgic amyotrophy: Secondary | ICD-10-CM | POA: Diagnosis not present

## 2016-08-29 DIAGNOSIS — J9819 Other pulmonary collapse: Secondary | ICD-10-CM | POA: Diagnosis not present

## 2016-08-29 DIAGNOSIS — E785 Hyperlipidemia, unspecified: Secondary | ICD-10-CM | POA: Diagnosis not present

## 2016-08-29 DIAGNOSIS — R5383 Other fatigue: Secondary | ICD-10-CM | POA: Insufficient documentation

## 2016-08-29 DIAGNOSIS — C3402 Malignant neoplasm of left main bronchus: Secondary | ICD-10-CM

## 2016-08-29 DIAGNOSIS — G629 Polyneuropathy, unspecified: Secondary | ICD-10-CM | POA: Diagnosis not present

## 2016-08-29 DIAGNOSIS — R918 Other nonspecific abnormal finding of lung field: Secondary | ICD-10-CM | POA: Diagnosis not present

## 2016-08-29 DIAGNOSIS — G4700 Insomnia, unspecified: Secondary | ICD-10-CM | POA: Insufficient documentation

## 2016-08-29 DIAGNOSIS — F419 Anxiety disorder, unspecified: Secondary | ICD-10-CM | POA: Diagnosis not present

## 2016-08-29 DIAGNOSIS — I313 Pericardial effusion (noninflammatory): Secondary | ICD-10-CM | POA: Diagnosis not present

## 2016-08-29 DIAGNOSIS — E538 Deficiency of other specified B group vitamins: Secondary | ICD-10-CM | POA: Insufficient documentation

## 2016-08-29 DIAGNOSIS — Z9221 Personal history of antineoplastic chemotherapy: Secondary | ICD-10-CM | POA: Diagnosis not present

## 2016-08-29 DIAGNOSIS — Z87891 Personal history of nicotine dependence: Secondary | ICD-10-CM | POA: Insufficient documentation

## 2016-08-29 DIAGNOSIS — D510 Vitamin B12 deficiency anemia due to intrinsic factor deficiency: Secondary | ICD-10-CM | POA: Insufficient documentation

## 2016-08-29 HISTORY — DX: Malignant neoplasm of unspecified part of left bronchus or lung: C34.92

## 2016-08-29 LAB — COMPREHENSIVE METABOLIC PANEL
ALBUMIN: 4 g/dL (ref 3.5–5.0)
ALT: 21 U/L (ref 14–54)
ANION GAP: 9 (ref 5–15)
AST: 26 U/L (ref 15–41)
Alkaline Phosphatase: 54 U/L (ref 38–126)
BUN: 10 mg/dL (ref 6–20)
CHLORIDE: 100 mmol/L — AB (ref 101–111)
CO2: 27 mmol/L (ref 22–32)
Calcium: 9.4 mg/dL (ref 8.9–10.3)
Creatinine, Ser: 1.06 mg/dL — ABNORMAL HIGH (ref 0.44–1.00)
GFR calc non Af Amer: 56 mL/min — ABNORMAL LOW (ref >60)
GLUCOSE: 114 mg/dL — AB (ref 65–99)
POTASSIUM: 3.6 mmol/L (ref 3.5–5.1)
SODIUM: 136 mmol/L (ref 135–145)
Total Bilirubin: 0.1 mg/dL — ABNORMAL LOW (ref 0.3–1.2)
Total Protein: 7.7 g/dL (ref 6.5–8.1)

## 2016-08-29 LAB — CBC WITH DIFFERENTIAL/PLATELET
BASOS PCT: 1 %
Basophils Absolute: 0.1 10*3/uL (ref 0–0.1)
EOS ABS: 0 10*3/uL (ref 0–0.7)
EOS PCT: 1 %
HCT: 30.8 % — ABNORMAL LOW (ref 35.0–47.0)
HEMOGLOBIN: 10.9 g/dL — AB (ref 12.0–16.0)
Lymphocytes Relative: 24 %
Lymphs Abs: 1.4 10*3/uL (ref 1.0–3.6)
MCH: 34.1 pg — AB (ref 26.0–34.0)
MCHC: 35.3 g/dL (ref 32.0–36.0)
MCV: 96.7 fL (ref 80.0–100.0)
MONOS PCT: 8 %
Monocytes Absolute: 0.5 10*3/uL (ref 0.2–0.9)
NEUTROS PCT: 66 %
Neutro Abs: 3.7 10*3/uL (ref 1.4–6.5)
PLATELETS: 194 10*3/uL (ref 150–440)
RBC: 3.18 MIL/uL — ABNORMAL LOW (ref 3.80–5.20)
RDW: 15.5 % — AB (ref 11.5–14.5)
WBC: 5.7 10*3/uL (ref 3.6–11.0)

## 2016-08-29 LAB — POCT I-STAT CREATININE: CREATININE: 1 mg/dL (ref 0.44–1.00)

## 2016-08-29 MED ORDER — IOPAMIDOL (ISOVUE-300) INJECTION 61%
75.0000 mL | Freq: Once | INTRAVENOUS | Status: AC | PRN
  Administered 2016-08-29: 75 mL via INTRAVENOUS

## 2016-09-03 NOTE — Progress Notes (Signed)
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  INTERVAL HISTORY: Patient returns to clinic today for further evaluation and discussion of her restaging CT scan results. She recently completed PCI. She has noticed increased weakness and fatigue recently. She has a poor appetite. She also complains of increased cough and shortness of breath. She also has continued neuropathy particularly in her fingertips. She has no other neurologic complaint. She denies any recent fevers. She denies any chest pain or hemoptysis. She denies any nausea, vomiting, constipation, or diarrhea. She has no urinary complaints. Patient feels generally terrible, but offers no further specific complaints.   REVIEW OF SYSTEMS:   Review of Systems  Constitutional: Positive for malaise/fatigue. Negative for fever and weight loss.  HENT: Negative for sore throat.   Eyes: Negative.   Respiratory: Positive for cough and shortness of breath. Negative for hemoptysis.   Cardiovascular: Negative.  Negative for chest pain.  Gastrointestinal: Negative for nausea and vomiting.  Genitourinary: Negative.   Musculoskeletal: Negative.   Skin: Negative.   Neurological: Positive for sensory change and weakness. Negative for dizziness and tingling.  Psychiatric/Behavioral: Positive for depression.    As per HPI. Otherwise, a complete review of systems is negative.  PAST MEDICAL HISTORY: Past Medical History:  Diagnosis Date  . Allergy   . Anemia    pernicious  . Anxiety   . Atrophic vaginitis   . COPD (chronic obstructive pulmonary disease) ([place])   . Depression   . Fatigue   . Hyperlipidemia   . Hypothyroid   . Insomnia   . Low back pain   .  Lumbago   . Osteopenia   . Small cell carcinoma of left lung ([place]) 01/2016   rad + chemo tx's  . Tobacco use   . Vitamin B12 deficiency     PAST SURGICAL HISTORY: Past Surgical History:  Procedure Laterality Date  . ABDOMINAL HYSTERECTOMY    . ANKLE SURGERY     x4  . CARPAL TUNNEL RELEASE  April 2015  . CHOLECYSTECTOMY    . ENDOBRONCHIAL ULTRASOUND N/A 01/07/2016   Procedure: ENDOBRONCHIAL ULTRASOUND;  Surgeon: Vilinda Boehringer, MD;  Location: [facility] ORS;  Service: Cardiopulmonary;  Laterality: N/A;  . HERNIA REPAIR    . PORTACATH PLACEMENT Right 01/23/2016   Procedure: INSERTION PORT-A-CATH;  Surgeon: [name], MD;  Location: [facility] ORS;  Service: General;  Laterality: Right;    FAMILY HISTORY Family History  Problem Relation Age of Onset  . Emphysema Mother   . Diabetes Father        ADVANCED DIRECTIVES:    HEALTH MAINTENANCE: Social History  Substance Use Topics  . Smoking status: Former Smoker    Packs/day: 1.00    Types: Cigarettes  . Smokeless tobacco: Never Used  . Alcohol use No     Allergies  Allergen Reactions  . Skelaxin [Metaxalone] Anaphylaxis  . Effexor [Venlafaxine]   . Hctz [Hydrochlorothiazide] Other (See Comments)    cramps  . Macrobid [Nitrofurantoin]   . Neurontin [Gabapentin]   . Paxil [Paroxetine Hcl]   . Pravachol [Pravastatin Sodium] Other (See Comments)    aching  . Prozac [Fluoxetine Hcl]   . Sulfur   . Wellbutrin [Bupropion]   . Zoloft [Sertraline Hcl]  Current Outpatient Prescriptions  Medication Sig Dispense Refill  . albuterol (PROAIR HFA) 108 (90 Base) MCG/ACT inhaler Inhale 2 puffs into the lungs every 4 (four) hours as needed for wheezing or shortness of breath. 8.5 g 1  . amitriptyline (ELAVIL) 10 MG tablet Take 2 tablets (20 mg total) by mouth at bedtime. 60 tablet 12  . cephALEXin (KEFLEX) 500 MG capsule Take 1 capsule (500 mg total) by mouth 3 (three) times daily. Do not start until you feel redness is worse/call MD  before starting. 30 capsule 0  . cimetidine (TAGAMET) 200 MG tablet Take 150 mg by mouth at bedtime.     . cyanocobalamin (,VITAMIN B-12,) 1000 MCG/ML injection Inject 1 mL (1,000 mcg total) into the muscle once. 10 mL 1  . escitalopram (LEXAPRO) 20 MG tablet Take 1 tablet (20 mg total) by mouth daily. 30 tablet 6  . lansoprazole (PREVACID) 30 MG capsule Take 1 capsule (30 mg total) by mouth daily at 12 noon. 30 capsule 2  . levothyroxine (SYNTHROID, LEVOTHROID) 137 MCG tablet Take 1 tablet (137 mcg total) by mouth daily before breakfast. 30 tablet 12  . lidocaine-prilocaine (EMLA) cream Apply to affected area once 30 g 3  . LORazepam (ATIVAN) 0.5 MG tablet Take 1 tablet (0.5 mg total) by mouth every 8 (eight) hours as needed for anxiety. 30 tablet 0  . methylPREDNISolone (MEDROL DOSEPAK) 4 MG TBPK tablet Take as directed 21 tablet 0  . ondansetron (ZOFRAN) 8 MG tablet Take 1 tablet (8 mg total) by mouth 2 (two) times daily as needed. Start on the third day after cisplatin chemotherapy. 30 tablet 1  . potassium chloride (KLOR-CON) 20 MEQ packet Take 20 mEq by mouth 2 (two) times daily. 30 packet 2  . potassium chloride, magnesium sulfate in sodium chloride 0.9 % 250 mL Inject into the vein.    Marland Kitchen prochlorperazine (COMPAZINE) 10 MG tablet Take 1 tablet (10 mg total) by mouth every 6 (six) hours as needed (Nausea or vomiting). 30 tablet 1  . promethazine (PHENERGAN) 25 MG tablet Take 1 tablet (25 mg total) by mouth every 6 (six) hours as needed for nausea or vomiting. 30 tablet 3  . simvastatin (ZOCOR) 10 MG tablet Take 1 tablet (10 mg total) by mouth daily at 6 PM. 30 tablet 6  . sucralfate (CARAFATE) 1 g tablet Take 1 tablet (1 g total) by mouth 3 (three) times daily. 90 tablet 3  . zolpidem (AMBIEN) 10 MG tablet Take 1 tablet (10 mg total) by mouth once. 30 tablet 5  . Gadopentetate Dimeglumine (MAGNEVIST IV) Inject into the vein.    Marland Kitchen levofloxacin (LEVAQUIN) 500 MG tablet Take 1 tablet (500 mg  total) by mouth daily. 7 tablet 0   No current facility-administered medications for this visit.     OBJECTIVE: Vitals:   09/04/16 1053  BP: 100/69  Pulse: (!) 121  Resp: 20  Temp: (!) 96.1 F (35.6 C)     Body mass index is 26.17 kg/m.    ECOG FS:2 - Symptomatic, <50% confined to bed  General: Well-developed, well-nourished. Sitting in wheelchair Eyes: Pink conjunctiva, anicteric sclera. HEENT: Oropharynx clear without erythema or exudate. Lungs: Clear to auscultation bilaterally. Heart: Tachycardia. No rubs, murmurs, or gallops. Musculoskeletal: No edema, cyanosis, or clubbing. Neuro: Alert, answering all questions appropriately. Cranial nerves grossly intact. Skin: No rashes or petechiae noted. Poor skin turgor. Psych: Normal affect.   LAB RESULTS:  Lab Results  Component Value Date   NA  136 08/29/2016   K 3.6 08/29/2016   CL 100 (L) 08/29/2016   CO2 27 08/29/2016   GLUCOSE 114 (H) 08/29/2016   BUN 10 08/29/2016   CREATININE 1.00 08/29/2016   CALCIUM 9.4 08/29/2016   PROT 7.7 08/29/2016   ALBUMIN 4.0 08/29/2016   AST 26 08/29/2016   ALT 21 08/29/2016   ALKPHOS 54 08/29/2016   BILITOT 0.1 (L) 08/29/2016   GFRNONAA 56 (L) 08/29/2016   GFRAA >60 08/29/2016    Lab Results  Component Value Date   WBC 5.7 08/29/2016   NEUTROABS 3.7 08/29/2016   HGB 10.9 (L) 08/29/2016   HCT 30.8 (L) 08/29/2016   MCV 96.7 08/29/2016   PLT 194 08/29/2016     STUDIES: Ct Chest W Contrast  Result Date: 08/29/2016 CLINICAL DATA:  Restaging small cell lung cancer diagnosed in favor 2017. Previous radiation and chemotherapy. Worsening cough X 2-3. EXAM: CT CHEST WITH CONTRAST TECHNIQUE: Multidetector CT imaging of the chest was performed during intravenous contrast administration. CONTRAST:  41m ISOVUE-300 IOPAMIDOL (ISOVUE-300) INJECTION 61% COMPARISON:  Chest CT 01/02/2016.  PET-CT 01/22/2016 and 05/26/2016. FINDINGS: Cardiovascular: Diffuse atherosclerosis of the aorta, great  vessels and coronary arteries. No acute vascular findings are evident. The heart size is normal. There is a stable small pericardial effusion. Right IJ Port-A-Cath tip at the SVC right atrial level. Mediastinum/Nodes: There are no enlarged mediastinal, hilar or axillary lymph nodes. The thyroid gland appears unremarkable. There is mild distal esophageal wall thickening, likely related to radiation therapy. Lungs/Pleura: New small dependent left pleural effusion without associated nodularity. There is recurrent near complete collapse of the left lower lobe. There is abrupt cut off of the left lower lobe bronchus. Patchy airspace opacities are present in the left upper lobe and medially in the right upper lobe. No discrete pulmonary nodules are seen. Upper abdomen: The visualized upper abdomen appears stable without suspicious findings. Musculoskeletal/Chest wall: There is no chest wall mass or suspicious osseous finding. IMPRESSION: 1. Recurrent near complete collapse of the left lower lobe with cut off of the left lower lobe bronchus and a new small left pleural effusion. Given the improvement seen on the most recent PET-CT, this could be secondary to radiation therapy and/or mucous plugging. Tumor recurrence in the left infrahilar region cannot be excluded. Short-term radiographic follow up (3-4 weeks) following respiratory therapy recommended. If the left lower lobe fails to re-expand, bronchoscopy may be warranted for further evaluation. 2. Additional patchy airspace opacities in both upper lobes, likely inflammatory. 3. No distant metastases demonstrated. Electronically Signed   By: WRichardean SaleM.D.   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  PLAN:    1. 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 Unclear if this is secondary to radiation therapy or mucous plugging, but malignancy recurrence cannot be excluded. Patient has been referred back to pulmonology for further evaluation and consideration of bronchoscopy. Return to clinic approximately one week after her bronchoscopy to discuss the results and further evaluation.  2. Shortness of breath: Likely secondary to collapsed lung. Pulmonary referral as above.  3. Poor appetite: Likely multifactorial, bronchoscopy as above and consider Megace in the future.  4. Dysphagia: Resolved. 5. Hypokalemia: Potassium now within normal  limits continue oral supplementation as prescribed. 6. Peripheral neuropathy: Patient is no longer taking gabapentin. 7. Anemia: Significantly improved, monitor.  Patient expressed understanding and was in agreement with this plan. She also understands that She can call clinic at any time with any questions, concerns, or complaints.    Lloyd Huger, MD   09/07/2016 8:34 AM

## 2016-09-04 ENCOUNTER — Ambulatory Visit
Admission: RE | Admit: 2016-09-04 | Discharge: 2016-09-04 | Disposition: A | Discharge status: Home / Self Care | Payer: Managed Care, Other (non HMO) | Source: Ambulatory Visit | Attending: Radiation Oncology | Admitting: Radiation Oncology

## 2016-09-04 ENCOUNTER — Inpatient Hospital Stay (HOSPITAL_BASED_OUTPATIENT_CLINIC_OR_DEPARTMENT_OTHER): Payer: Managed Care, Other (non HMO) | Admitting: Oncology

## 2016-09-04 VITALS — BP 100/69 | HR 121 | Temp 96.1°F | Resp 20 | Wt 152.4 lb

## 2016-09-04 DIAGNOSIS — E785 Hyperlipidemia, unspecified: Secondary | ICD-10-CM

## 2016-09-04 DIAGNOSIS — E039 Hypothyroidism, unspecified: Secondary | ICD-10-CM

## 2016-09-04 DIAGNOSIS — R630 Anorexia: Secondary | ICD-10-CM | POA: Diagnosis not present

## 2016-09-04 DIAGNOSIS — Z87891 Personal history of nicotine dependence: Secondary | ICD-10-CM | POA: Diagnosis not present

## 2016-09-04 DIAGNOSIS — J90 Pleural effusion, not elsewhere classified: Secondary | ICD-10-CM

## 2016-09-04 DIAGNOSIS — I313 Pericardial effusion (noninflammatory): Secondary | ICD-10-CM

## 2016-09-04 DIAGNOSIS — G4700 Insomnia, unspecified: Secondary | ICD-10-CM

## 2016-09-04 DIAGNOSIS — Z923 Personal history of irradiation: Secondary | ICD-10-CM

## 2016-09-04 DIAGNOSIS — D510 Vitamin B12 deficiency anemia due to intrinsic factor deficiency: Secondary | ICD-10-CM

## 2016-09-04 DIAGNOSIS — C3432 Malignant neoplasm of lower lobe, left bronchus or lung: Secondary | ICD-10-CM | POA: Insufficient documentation

## 2016-09-04 DIAGNOSIS — R0602 Shortness of breath: Secondary | ICD-10-CM

## 2016-09-04 DIAGNOSIS — F329 Major depressive disorder, single episode, unspecified: Secondary | ICD-10-CM

## 2016-09-04 DIAGNOSIS — R531 Weakness: Secondary | ICD-10-CM

## 2016-09-04 DIAGNOSIS — I700 Atherosclerosis of aorta: Secondary | ICD-10-CM

## 2016-09-04 DIAGNOSIS — R110 Nausea: Secondary | ICD-10-CM

## 2016-09-04 DIAGNOSIS — E538 Deficiency of other specified B group vitamins: Secondary | ICD-10-CM

## 2016-09-04 DIAGNOSIS — M8580 Other specified disorders of bone density and structure, unspecified site: Secondary | ICD-10-CM

## 2016-09-04 DIAGNOSIS — J9811 Atelectasis: Secondary | ICD-10-CM | POA: Diagnosis not present

## 2016-09-04 DIAGNOSIS — C7931 Secondary malignant neoplasm of brain: Secondary | ICD-10-CM | POA: Diagnosis present

## 2016-09-04 DIAGNOSIS — F419 Anxiety disorder, unspecified: Secondary | ICD-10-CM

## 2016-09-04 DIAGNOSIS — R5383 Other fatigue: Secondary | ICD-10-CM

## 2016-09-04 DIAGNOSIS — G629 Polyneuropathy, unspecified: Secondary | ICD-10-CM

## 2016-09-04 DIAGNOSIS — J449 Chronic obstructive pulmonary disease, unspecified: Secondary | ICD-10-CM

## 2016-09-04 DIAGNOSIS — Z79899 Other long term (current) drug therapy: Secondary | ICD-10-CM

## 2016-09-04 DIAGNOSIS — K229 Disease of esophagus, unspecified: Secondary | ICD-10-CM

## 2016-09-04 DIAGNOSIS — Z9221 Personal history of antineoplastic chemotherapy: Secondary | ICD-10-CM

## 2016-09-04 DIAGNOSIS — R05 Cough: Secondary | ICD-10-CM

## 2016-09-04 DIAGNOSIS — G545 Neuralgic amyotrophy: Secondary | ICD-10-CM

## 2016-09-04 MED ORDER — PROMETHAZINE HCL 25 MG PO TABS: 25.0000 mg | ORAL_TABLET | Freq: Four times a day (QID) | ORAL | 3 refills | Status: DC | PRN

## 2016-09-04 MED ORDER — LEVOFLOXACIN 500 MG PO TABS: 500.0000 mg | ORAL_TABLET | Freq: Every day | ORAL | 0 refills | Status: DC

## 2016-09-04 NOTE — Progress Notes (Signed)
Radiation Oncology Follow up Note  Name: Jill Gross   Date:   09/04/2016 MRN:  [id number] DOB: 09-17-1956    This 60 y.o. female presents to the clinic today for one-month follow-up status post whole brain radiation associated with limited stage small cell lung cancer.  REFERRING PROVIDER: [name], NP  HPI: Patient is a 60 year old female now 1 month out having completed whole brain radiation therapy to prophylactic mode after having a complete response to chemoradiation for limited stage small cell lung cancer. She seen today she is wheelchair-bound she's feeling quite with fatigue and has poor by mouth intake and weakness. She had an excellent response initially to treatment with normalization of her PET CT scan. Unfortunately recent CT scan shows atelectatic lung in the left lower lobe with some occlusion of the left mainstem takeoff. She specifically denies cough hemoptysis or chest tightness..  COMPLICATIONS OF TREATMENT: none  FOLLOW UP COMPLIANCE: keeps appointments   PHYSICAL EXAM:  LMP  (LMP Unknown)  Well-developed well-nourished patient in NAD. HEENT reveals PERLA, EOMI, discs not visualized.  Oral cavity is clear. No oral mucosal lesions are identified. Neck is clear without evidence of cervical or supraclavicular adenopathy. Lungs are clear to A&P. Cardiac examination is essentially unremarkable with regular rate and rhythm without murmur rub or thrill. Abdomen is benign with no organomegaly or masses noted. Motor sensory and DTR levels are equal and symmetric in the upper and lower extremities. Cranial nerves II through XII are grossly intact. Proprioception is intact. No peripheral adenopathy or edema is identified. No motor or sensory levels are noted. Crude visual fields are within normal range.  RADIOLOGY RESULTS: Recent CT scans are reviewed compatible with the above-stated findings  PLAN: Case was presented at our weekly tumor conference. Where having pulmonary  evaluate the patient for possible bronchoscopy. This may be a mucous plug in her left lower lobe or recurrent tumor. Based on the results of bronchoscopy will make further treatment recommendations. I discussed case personally with medical oncology appointments for follow-up and pulmonology were made.  I would like to take this opportunity to thank you for allowing me to participate in the care of your patient.Armstead Peaks., MD

## 2016-09-08 ENCOUNTER — Encounter: Payer: Self-pay | Admitting: Internal Medicine

## 2016-09-08 ENCOUNTER — Ambulatory Visit (INDEPENDENT_AMBULATORY_CARE_PROVIDER_SITE_OTHER): Payer: Managed Care, Other (non HMO) | Admitting: Internal Medicine

## 2016-09-08 VITALS — BP 130/68 | HR 100 | Ht 63.5 in | Wt 152.8 lb

## 2016-09-08 DIAGNOSIS — R918 Other nonspecific abnormal finding of lung field: Secondary | ICD-10-CM | POA: Diagnosis not present

## 2016-09-08 DIAGNOSIS — J9811 Atelectasis: Secondary | ICD-10-CM | POA: Insufficient documentation

## 2016-09-08 MED ORDER — GLYCOPYRROLATE-FORMOTEROL 9-4.8 MCG/ACT IN AERO
2.0000 | INHALATION_SPRAY | Freq: Two times a day (BID) | RESPIRATORY_TRACT | 0 refills | Status: DC
Start: 2016-09-08 — End: 2016-09-08

## 2016-09-08 MED ORDER — ALBUTEROL SULFATE HFA 108 (90 BASE) MCG/ACT IN AERS: 2.0000 | INHALATION_SPRAY | RESPIRATORY_TRACT | 3 refills | Status: DC | PRN

## 2016-09-08 MED ORDER — AMBULATORY NON FORMULARY MEDICATION: 0 refills | Status: DC

## 2016-09-08 NOTE — Patient Instructions (Addendum)
Follow up with Dr. Stevenson Clinch in:2 months - we will schedule you for a bronchoscopy (EBUS, regular) given the LLL findings - incentive spirometry 10-15 times daily - albuterol inhaler - 2puff every 3-4 hours as needed for shortness of breath\wheezing\recurrent cough

## 2016-09-08 NOTE — Progress Notes (Signed)
[place] [facility] Consultation      MRN# [id number] Jill Gross December 03, 1956   CC: Chief Complaint  Patient presents with  . pulmonary consult    Per Dr. [name]. pt c/o sob with exertion, dry cough at times prod with cream mucus, wheezing, increased weakness & fatigue.      Brief History: 60 yo hx of former smoker, LLL mass, biopsy with Small cell, received Chemo and radiation.  Now with prophylactic brain radiation, started to develop more sob and cough, CT with possible LLL collapse vs mucus plugging.    Events since last clinic visit: Patient present today for another bronchoscopy evaluation.  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 About 3 weeks ago she started prophylactic brain radiation therapy given a history of small cell lung cancer, shortly after that started developing increased weakness, shortness of breath, intermittent wheezing. A repeat CT of the chest was done that showed left lower lobe collapse with his mucous plug versus the lung mass. She was sent to pulmonary for another bronchoscopic evaluation. Today she endorses shortness of breath, weakness, fatigue, decreased appetite, hair loss.   Current Outpatient Prescriptions:  .  albuterol (PROAIR HFA) 108 (90 Base) MCG/ACT inhaler, Inhale 2 puffs into the lungs every 4 (four) hours as needed for wheezing or shortness of breath., Disp: 8.5 g, Rfl: 1 .  amitriptyline (ELAVIL) 10 MG tablet, Take 2 tablets (20 mg total) by mouth at bedtime., Disp: 60 tablet, Rfl: 12 .  cephALEXin (KEFLEX) 500 MG capsule, Take 1 capsule (500 mg total) by mouth 3 (three) times daily. Do not start until you feel redness is worse/call MD before starting., Disp: 30 capsule, Rfl: 0 .  cimetidine (TAGAMET) 200 MG tablet, Take 150 mg by mouth at bedtime. , Disp: , Rfl:  .  cyanocobalamin (,VITAMIN B-12,) 1000  MCG/ML injection, Inject 1 mL (1,000 mcg total) into the muscle once., Disp: 10 mL, Rfl: 1 .  escitalopram (LEXAPRO) 20 MG tablet, Take 1 tablet (20 mg total) by mouth daily., Disp: 30 tablet, Rfl: 6 .  Gadopentetate Dimeglumine (MAGNEVIST IV), Inject into the vein., Disp: , Rfl:  .  lansoprazole (PREVACID) 30 MG capsule, Take 1 capsule (30 mg total) by mouth daily at 12 noon., Disp: 30 capsule, Rfl: 2 .  levofloxacin (LEVAQUIN) 500 MG tablet, Take 1 tablet (500 mg total) by mouth daily., Disp: 7 tablet, Rfl: 0 .  levothyroxine (SYNTHROID, LEVOTHROID) 137 MCG tablet, Take 1 tablet (137 mcg total) by mouth daily before breakfast., Disp: 30 tablet, Rfl: 12 .  lidocaine-prilocaine (EMLA) cream, Apply to affected area once, Disp: 30 g, Rfl: 3 .  LORazepam (ATIVAN) 0.5 MG tablet, Take 1 tablet (0.5 mg total) by mouth every 8 (eight) hours as needed for anxiety., Disp: 30 tablet, Rfl: 0 .  methylPREDNISolone (MEDROL DOSEPAK) 4 MG TBPK tablet, Take as directed, Disp: 21 tablet, Rfl: 0 .  ondansetron (ZOFRAN) 8 MG tablet, Take 1 tablet (8 mg total) by mouth 2 (two) times daily as needed. Start on the third day after cisplatin chemotherapy., Disp: 30 tablet, Rfl: 1 .  potassium chloride (KLOR-CON) 20 MEQ packet, Take 20 mEq by mouth 2 (two) times daily., Disp: 30 packet, Rfl: 2 .  potassium chloride, magnesium sulfate in sodium chloride 0.9 % 250 mL, Inject into the vein., Disp: , Rfl:  .  prochlorperazine (COMPAZINE) 10 MG tablet, Take  1 tablet (10 mg total) by mouth every 6 (six) hours as needed (Nausea or vomiting)., Disp: 30 tablet, Rfl: 1 .  promethazine (PHENERGAN) 25 MG tablet, Take 1 tablet (25 mg total) by mouth every 6 (six) hours as needed for nausea or vomiting., Disp: 30 tablet, Rfl: 3 .  simvastatin (ZOCOR) 10 MG tablet, Take 1 tablet (10 mg total) by mouth daily at 6 PM., Disp: 30 tablet, Rfl: 6 .  sucralfate (CARAFATE) 1 g tablet, Take 1 tablet (1 g total) by mouth 3 (three) times daily., Disp:  90 tablet, Rfl: 3 .  zolpidem (AMBIEN) 10 MG tablet, Take 1 tablet (10 mg total) by mouth once., Disp: 30 tablet, Rfl: 5   Review of Systems  Constitutional: Positive for malaise/fatigue. Negative for chills and fever.  HENT: Negative for hearing loss.   Eyes: Negative for blurred vision and double vision.  Respiratory: Positive for cough, sputum production, shortness of breath and wheezing. Negative for hemoptysis.   Gastrointestinal: Positive for nausea. Negative for heartburn and vomiting.  Genitourinary: Negative for dysuria.  Neurological: Positive for weakness. Negative for dizziness and headaches.  Endo/Heme/Allergies: Does not bruise/bleed easily.  Psychiatric/Behavioral: Negative for depression.      Allergies:  Skelaxin [metaxalone]; Effexor [venlafaxine]; Hctz [hydrochlorothiazide]; Macrobid [nitrofurantoin]; Neurontin [gabapentin]; Paxil [paroxetine hcl]; Pravachol [pravastatin sodium]; Prozac [fluoxetine hcl]; Sulfur; Wellbutrin [bupropion]; and Zoloft [sertraline hcl]  Physical Examination:  VS: BP 130/68 (BP Location: Right Arm, Cuff Size: Normal)   Pulse 100   Ht 5' 3.5" (1.613 m)   Wt 152 lb 12.8 oz (69.3 kg)   LMP  (LMP Unknown)   SpO2 96%   BMI 26.64 kg/m   General Appearance: No distress  HEENT: PERRLA, no ptosis, no other lesions noticed Pulmonary:good airway entry, dec BS on the left base, good airway movement on the Right. No wheezes, no crackles.  Cardiovascular:  Normal S1,S2.  No m/r/g.     Abdomen:Exam: Benign, Soft, non-tender, No masses  Skin:   warm, no rashes, no ecchymosis  Extremities: normal, no cyanosis, clubbing, warm with normal capillary refill.      Rad results: (The following images and results were reviewed by Dr. Stevenson Clinch on 09/08/2016). CT CHest 08/09/16 EXAM: CT CHEST WITH CONTRAST  TECHNIQUE: Multidetector CT imaging of the chest was performed during intravenous contrast administration.  CONTRAST:  38m ISOVUE-300 IOPAMIDOL  (ISOVUE-300) INJECTION 61%  COMPARISON:  Chest CT 01/02/2016.  PET-CT 01/22/2016 and 05/26/2016.  FINDINGS: Cardiovascular: Diffuse atherosclerosis of the aorta, great vessels and coronary arteries. No acute vascular findings are evident. The heart size is normal. There is a stable small pericardial effusion. Right IJ Port-A-Cath tip at the SVC right atrial level.  Mediastinum/Nodes: There are no enlarged mediastinal, hilar or axillary lymph nodes. The thyroid gland appears unremarkable. There is mild distal esophageal wall thickening, likely related to radiation therapy.  Lungs/Pleura: New small dependent left pleural effusion without associated nodularity. There is recurrent near complete collapse of the left lower lobe. There is abrupt cut off of the left lower lobe bronchus. Patchy airspace opacities are present in the left upper lobe and medially in the right upper lobe. No discrete pulmonary nodules are seen.  Upper abdomen: The visualized upper abdomen appears stable without suspicious findings.  Musculoskeletal/Chest wall: There is no chest wall mass or suspicious osseous finding.  IMPRESSION: 1. Recurrent near complete collapse of the left lower lobe with cut off of the left lower lobe bronchus and a new small left pleural effusion. Given  the improvement seen on the most recent PET-CT, this could be secondary to radiation therapy and/or mucous plugging. Tumor recurrence in the left infrahilar region cannot be excluded. Short-term radiographic follow up (3-4 weeks) following respiratory therapy recommended. If the left lower lobe fails to re-expand, bronchoscopy may be warranted for further evaluation. 2. Additional patchy airspace opacities in both upper lobes, likely inflammatory. 3. No distant metastases demonstrated.      Assessment and Plan:60 yo former smoker with Hx of L sided Small cell lung ca, now with LLL atelectasis vs collapse, seen for  evaluation of bronchoscopy.  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 CT reviewed, there is a new left small pleural effusion, the left lower lobe bronchus is somewhat obscured, either by mucous plug or complete collapse. There is a consideration for tumor recurrence versus mucus plugging versus by mouth collapse, warranting a bronchoscopic evaluation. We will set up patient for a bronchoscopy, Evamist, with possible FNA biopsies. In the interim she is advised to use incentive spirometry, exercise as tolerated, and albuterol rescue inhaler as needed  Collapse of left lung Possible etiologies include: Mucus plugging, new mass, atelectasis or tumor recurrence  Plan: -Bronchoscopic evaluation warranted   Updated Medication List Outpatient Encounter Prescriptions as of 09/08/2016  Medication Sig  . albuterol (PROAIR HFA) 108 (90 Base) MCG/ACT inhaler Inhale 2 puffs into the lungs every 4 (four) hours as needed for wheezing or shortness of breath.  Marland Kitchen amitriptyline (ELAVIL) 10 MG tablet Take 2 tablets (20 mg total) by mouth at bedtime.  . cephALEXin (KEFLEX) 500 MG capsule Take 1 capsule (500 mg total) by mouth 3 (three) times daily. Do not start until you feel redness is worse/call MD before starting.  . cimetidine (TAGAMET) 200 MG tablet Take 150 mg by mouth at bedtime.   . cyanocobalamin (,VITAMIN B-12,) 1000 MCG/ML injection Inject 1 mL (1,000 mcg total) into the muscle once.  . escitalopram (LEXAPRO) 20 MG tablet Take 1 tablet (20 mg total) by mouth daily.  . Gadopentetate Dimeglumine (MAGNEVIST IV) Inject into the vein.  Marland Kitchen lansoprazole (PREVACID) 30 MG capsule Take 1 capsule (30 mg total) by mouth daily at 12 noon.  Marland Kitchen levofloxacin (LEVAQUIN) 500 MG tablet Take 1 tablet (500 mg total) by mouth daily.  Marland Kitchen levothyroxine (SYNTHROID, LEVOTHROID) 137 MCG tablet Take 1 tablet (137 mcg total) by mouth daily before breakfast.  .  lidocaine-prilocaine (EMLA) cream Apply to affected area once  . LORazepam (ATIVAN) 0.5 MG tablet Take 1 tablet (0.5 mg total) by mouth every 8 (eight) hours as needed for anxiety.  . methylPREDNISolone (MEDROL DOSEPAK) 4 MG TBPK tablet Take as directed  . ondansetron (ZOFRAN) 8 MG tablet Take 1 tablet (8 mg total) by mouth 2 (two) times daily as needed. Start on the third day after cisplatin chemotherapy.  . potassium chloride (KLOR-CON) 20 MEQ packet Take 20 mEq by mouth 2 (two) times daily.  . potassium chloride, magnesium sulfate in sodium chloride 0.9 % 250 mL Inject into the vein.  Marland Kitchen prochlorperazine (COMPAZINE) 10 MG tablet Take 1 tablet (10 mg total) by mouth every 6 (six) hours as needed (Nausea or vomiting).  . promethazine (PHENERGAN) 25 MG tablet Take 1 tablet (25 mg total) by mouth every 6 (six) hours as needed for nausea or vomiting.  . simvastatin (ZOCOR) 10 MG tablet Take 1 tablet (10 mg total) by mouth daily at 6 PM.  .  sucralfate (CARAFATE) 1 g tablet Take 1 tablet (1 g total) by mouth 3 (three) times daily.  Marland Kitchen zolpidem (AMBIEN) 10 MG tablet Take 1 tablet (10 mg total) by mouth once.   No facility-administered encounter medications on file as of 09/08/2016.     Orders for this visit: No orders of the defined types were placed in this encounter.   Thank  you for the visitation and for allowing  [facility] to assist in the care of your patient. Our recommendations are noted above.  Please contact us if we can be of further service.  Vilinda Boehringer, MD [facility] Office Number: [phone]  Note: This note was prepared with Dragon dictation along with smaller phrase technology. Any transcriptional errors that result from this process are unintentional.

## 2016-09-08 NOTE — Assessment & Plan Note (Signed)
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 CT reviewed, there is a new left small pleural effusion, the left lower lobe bronchus is somewhat obscured, either by mucous plug or complete collapse. There is a consideration for tumor recurrence versus mucus plugging versus by mouth collapse, warranting a bronchoscopic evaluation. We will set up patient for a bronchoscopy, Evamist, with possible FNA biopsies. In the interim she is advised to use incentive spirometry, exercise as tolerated, and albuterol rescue inhaler as needed

## 2016-09-08 NOTE — Assessment & Plan Note (Signed)
Possible etiologies include: Mucus plugging, new mass, atelectasis or tumor recurrence  Plan: -Bronchoscopic evaluation warranted

## 2016-09-12 ENCOUNTER — Inpatient Hospital Stay: Payer: Managed Care, Other (non HMO) | Attending: Oncology

## 2016-09-12 ENCOUNTER — Encounter
Admission: RE | Admit: 2016-09-12 | Discharge: 2016-09-12 | Disposition: A | Discharge status: Home / Self Care | Payer: Managed Care, Other (non HMO) | Source: Ambulatory Visit | Attending: Internal Medicine | Admitting: Internal Medicine

## 2016-09-12 DIAGNOSIS — J449 Chronic obstructive pulmonary disease, unspecified: Secondary | ICD-10-CM | POA: Insufficient documentation

## 2016-09-12 DIAGNOSIS — R630 Anorexia: Secondary | ICD-10-CM | POA: Insufficient documentation

## 2016-09-12 DIAGNOSIS — R0602 Shortness of breath: Secondary | ICD-10-CM | POA: Insufficient documentation

## 2016-09-12 DIAGNOSIS — D649 Anemia, unspecified: Secondary | ICD-10-CM | POA: Insufficient documentation

## 2016-09-12 DIAGNOSIS — N952 Postmenopausal atrophic vaginitis: Secondary | ICD-10-CM | POA: Insufficient documentation

## 2016-09-12 DIAGNOSIS — K219 Gastro-esophageal reflux disease without esophagitis: Secondary | ICD-10-CM | POA: Diagnosis not present

## 2016-09-12 DIAGNOSIS — M545 Low back pain: Secondary | ICD-10-CM | POA: Diagnosis not present

## 2016-09-12 DIAGNOSIS — Z01818 Encounter for other preprocedural examination: Secondary | ICD-10-CM | POA: Diagnosis not present

## 2016-09-12 DIAGNOSIS — C801 Malignant (primary) neoplasm, unspecified: Secondary | ICD-10-CM | POA: Insufficient documentation

## 2016-09-12 DIAGNOSIS — Z87891 Personal history of nicotine dependence: Secondary | ICD-10-CM | POA: Insufficient documentation

## 2016-09-12 DIAGNOSIS — M129 Arthropathy, unspecified: Secondary | ICD-10-CM | POA: Diagnosis not present

## 2016-09-12 DIAGNOSIS — T451X5S Adverse effect of antineoplastic and immunosuppressive drugs, sequela: Secondary | ICD-10-CM | POA: Diagnosis not present

## 2016-09-12 DIAGNOSIS — F419 Anxiety disorder, unspecified: Secondary | ICD-10-CM | POA: Diagnosis not present

## 2016-09-12 DIAGNOSIS — K5790 Diverticulosis of intestine, part unspecified, without perforation or abscess without bleeding: Secondary | ICD-10-CM | POA: Diagnosis not present

## 2016-09-12 DIAGNOSIS — R531 Weakness: Secondary | ICD-10-CM | POA: Insufficient documentation

## 2016-09-12 DIAGNOSIS — Z79899 Other long term (current) drug therapy: Secondary | ICD-10-CM | POA: Insufficient documentation

## 2016-09-12 DIAGNOSIS — Z85118 Personal history of other malignant neoplasm of bronchus and lung: Secondary | ICD-10-CM | POA: Insufficient documentation

## 2016-09-12 DIAGNOSIS — E039 Hypothyroidism, unspecified: Secondary | ICD-10-CM | POA: Insufficient documentation

## 2016-09-12 DIAGNOSIS — Z452 Encounter for adjustment and management of vascular access device: Secondary | ICD-10-CM | POA: Diagnosis not present

## 2016-09-12 DIAGNOSIS — I313 Pericardial effusion (noninflammatory): Secondary | ICD-10-CM | POA: Insufficient documentation

## 2016-09-12 DIAGNOSIS — E785 Hyperlipidemia, unspecified: Secondary | ICD-10-CM | POA: Diagnosis not present

## 2016-09-12 DIAGNOSIS — C3432 Malignant neoplasm of lower lobe, left bronchus or lung: Secondary | ICD-10-CM

## 2016-09-12 DIAGNOSIS — M8580 Other specified disorders of bone density and structure, unspecified site: Secondary | ICD-10-CM | POA: Insufficient documentation

## 2016-09-12 DIAGNOSIS — R000 Tachycardia, unspecified: Secondary | ICD-10-CM | POA: Diagnosis not present

## 2016-09-12 DIAGNOSIS — J9819 Other pulmonary collapse: Secondary | ICD-10-CM | POA: Insufficient documentation

## 2016-09-12 DIAGNOSIS — R5383 Other fatigue: Secondary | ICD-10-CM | POA: Diagnosis not present

## 2016-09-12 DIAGNOSIS — K229 Disease of esophagus, unspecified: Secondary | ICD-10-CM | POA: Insufficient documentation

## 2016-09-12 DIAGNOSIS — Z923 Personal history of irradiation: Secondary | ICD-10-CM | POA: Insufficient documentation

## 2016-09-12 DIAGNOSIS — E538 Deficiency of other specified B group vitamins: Secondary | ICD-10-CM | POA: Insufficient documentation

## 2016-09-12 DIAGNOSIS — J90 Pleural effusion, not elsewhere classified: Secondary | ICD-10-CM | POA: Insufficient documentation

## 2016-09-12 DIAGNOSIS — Z9221 Personal history of antineoplastic chemotherapy: Secondary | ICD-10-CM | POA: Insufficient documentation

## 2016-09-12 DIAGNOSIS — G629 Polyneuropathy, unspecified: Secondary | ICD-10-CM | POA: Insufficient documentation

## 2016-09-12 DIAGNOSIS — F329 Major depressive disorder, single episode, unspecified: Secondary | ICD-10-CM | POA: Insufficient documentation

## 2016-09-12 DIAGNOSIS — G4700 Insomnia, unspecified: Secondary | ICD-10-CM | POA: Diagnosis not present

## 2016-09-12 DIAGNOSIS — I700 Atherosclerosis of aorta: Secondary | ICD-10-CM | POA: Insufficient documentation

## 2016-09-12 HISTORY — DX: Diverticulosis of intestine, part unspecified, without perforation or abscess without bleeding: K57.90

## 2016-09-12 HISTORY — DX: Personal history of antineoplastic chemotherapy: Z92.21

## 2016-09-12 HISTORY — DX: Dyspnea, unspecified: R06.00

## 2016-09-12 HISTORY — DX: Gastro-esophageal reflux disease without esophagitis: K21.9

## 2016-09-12 HISTORY — DX: Drug-induced polyneuropathy: G62.0

## 2016-09-12 HISTORY — DX: Unspecified osteoarthritis, unspecified site: M19.90

## 2016-09-12 HISTORY — DX: Adverse effect of antineoplastic and immunosuppressive drugs, initial encounter: T45.1X5A

## 2016-09-12 MED ORDER — SODIUM CHLORIDE 0.9% FLUSH
10.0000 mL | Freq: Once | INTRAVENOUS | Status: AC
  Administered 2016-09-12: 10 mL via INTRAVENOUS
  Filled 2016-09-12: qty 10

## 2016-09-12 MED ORDER — HEPARIN SOD (PORK) LOCK FLUSH 100 UNIT/ML IV SOLN
500.0000 [IU] | Freq: Once | INTRAVENOUS | Status: AC
  Administered 2016-09-12: 500 [IU] via INTRAVENOUS
  Filled 2016-09-12: qty 5

## 2016-09-12 NOTE — Patient Instructions (Signed)
  Your procedure is scheduled KP:[id number] 10, 2017 (Tuesday) Report to Same Day Surgery 2nd floor [facility] To find out your arrival time please call [phone] between 1PM - 3PM on September 15, 2016 (Monday)  Remember: Instructions that are not followed completely may result in serious medical risk, up to and including death, or upon the discretion of your surgeon and anesthesiologist your surgery may need to be rescheduled.    _x___ 1. Do not eat food or drink liquids after midnight. No gum chewing or hard candies.     _x   __ 2. No Alcohol for 24 hours before or after surgery.   __x__3. No Smoking for 24 prior to surgery.   ____  4. Bring all medications with you on the day of surgery if instructed.    __x__ 5. Notify your doctor if there is any change in your medical condition     (cold, fever, infections).     Do not wear jewelry, make-up, hairpins, clips or nail polish.  Do not wear lotions, powders, or perfumes. You may wear deodorant.  Do not shave 48 hours prior to surgery. Men may shave face and neck.  Do not bring valuables to the hospital.    [facility] is not responsible for any belongings or valuables.               Contacts, dentures or bridgework may not be worn into surgery.  Leave your suitcase in the car. After surgery it may be brought to your room.  For patients admitted to the hospital, discharge time is determined by your treatment team.   Patients discharged the day of surgery will not be allowed to drive home.    Please read over the following fact sheets that you were given:   [facility] Preparing for Surgery and or MRSA Information   _x___ Take these medicines the morning of surgery with A SIP OF WATER:    1. PREVACID  2. LEVOTHYROXINE  3.  4.  5.  6.  ____Fleets enema or Magnesium Citrate as directed.   ___ Use CHG Soap or sage wipes as directed on instruction sheet   __x__ Use inhalers on the day of surgery and bring to hospital day of  surgery (Use Albuterol inhaler the morning of surgery and bring to hospital)  ____ Stop metformin 2 days prior to surgery    ____ Take 1/2 of usual insulin dose the night before surgery and none on the morning of           surgery.   _x__ Stop aspirin or coumadin, or plavix (patient stopped Aspirin on September 19)  x__ Stop Anti-inflammatories such as Advil, Aleve, Ibuprofen, Motrin, Naproxen,          Naprosyn, Goodies powders or aspirin products. Ok to take Tylenol.   ____ Stop supplements until after surgery.    ____ Bring C-Pap to the hospital.

## 2016-09-16 ENCOUNTER — Ambulatory Visit: Payer: Managed Care, Other (non HMO) | Admitting: Anesthesiology

## 2016-09-16 ENCOUNTER — Encounter
Admission: RE | Disposition: A | Discharge status: Home / Self Care | Payer: Self-pay | Source: Ambulatory Visit | Attending: Internal Medicine

## 2016-09-16 ENCOUNTER — Ambulatory Visit
Admission: RE | Admit: 2016-09-16 | Discharge: 2016-09-16 | Disposition: A | Discharge status: Home / Self Care | Payer: Managed Care, Other (non HMO) | Source: Ambulatory Visit | Attending: Internal Medicine | Admitting: Internal Medicine

## 2016-09-16 ENCOUNTER — Encounter: Payer: Self-pay | Admitting: *Deleted

## 2016-09-16 DIAGNOSIS — Z87891 Personal history of nicotine dependence: Secondary | ICD-10-CM | POA: Insufficient documentation

## 2016-09-16 DIAGNOSIS — C3490 Malignant neoplasm of unspecified part of unspecified bronchus or lung: Secondary | ICD-10-CM | POA: Diagnosis present

## 2016-09-16 DIAGNOSIS — J449 Chronic obstructive pulmonary disease, unspecified: Secondary | ICD-10-CM | POA: Insufficient documentation

## 2016-09-16 DIAGNOSIS — K219 Gastro-esophageal reflux disease without esophagitis: Secondary | ICD-10-CM | POA: Diagnosis not present

## 2016-09-16 DIAGNOSIS — M1990 Unspecified osteoarthritis, unspecified site: Secondary | ICD-10-CM | POA: Diagnosis not present

## 2016-09-16 DIAGNOSIS — R918 Other nonspecific abnormal finding of lung field: Secondary | ICD-10-CM | POA: Diagnosis not present

## 2016-09-16 HISTORY — PX: ENDOBRONCHIAL ULTRASOUND: SHX5096

## 2016-09-16 SURGERY — ENDOBRONCHIAL ULTRASOUND (EBUS)
Anesthesia: General

## 2016-09-16 MED ORDER — FENTANYL CITRATE (PF) 100 MCG/2ML IJ SOLN
INTRAMUSCULAR | Status: DC | PRN
  Administered 2016-09-16: 50 ug via INTRAVENOUS

## 2016-09-16 MED ORDER — SUCCINYLCHOLINE CHLORIDE 20 MG/ML IJ SOLN
INTRAMUSCULAR | Status: DC | PRN
  Administered 2016-09-16: 120 mg via INTRAVENOUS

## 2016-09-16 MED ORDER — LACTATED RINGERS IV SOLN
INTRAVENOUS | Status: DC | PRN
  Administered 2016-09-16: 15:00:00 via INTRAVENOUS

## 2016-09-16 MED ORDER — LIDOCAINE HCL (CARDIAC) 20 MG/ML IV SOLN
INTRAVENOUS | Status: DC | PRN
  Administered 2016-09-16: 60 mg via INTRAVENOUS

## 2016-09-16 MED ORDER — PROPOFOL 10 MG/ML IV BOLUS
INTRAVENOUS | Status: DC | PRN
  Administered 2016-09-16: 150 mg via INTRAVENOUS

## 2016-09-16 MED ORDER — MIDAZOLAM HCL 2 MG/2ML IJ SOLN
INTRAMUSCULAR | Status: DC | PRN
  Administered 2016-09-16: 1 mg via INTRAVENOUS

## 2016-09-16 MED ORDER — LACTATED RINGERS IV SOLN
INTRAVENOUS | Status: DC
  Administered 2016-09-16: 15:00:00 via INTRAVENOUS

## 2016-09-16 MED ORDER — ROCURONIUM BROMIDE 100 MG/10ML IV SOLN
INTRAVENOUS | Status: DC | PRN
Start: 2016-09-16 — End: 2016-09-16
  Administered 2016-09-16: 10 mg via INTRAVENOUS
  Administered 2016-09-16: 5 mg via INTRAVENOUS

## 2016-09-16 MED ORDER — DEXAMETHASONE SODIUM PHOSPHATE 10 MG/ML IJ SOLN
INTRAMUSCULAR | Status: DC | PRN
  Administered 2016-09-16: 5 mg via INTRAVENOUS

## 2016-09-16 MED ORDER — PHENYLEPHRINE HCL 10 MG/ML IJ SOLN
INTRAMUSCULAR | Status: DC | PRN
  Administered 2016-09-16: 200 ug via INTRAVENOUS
  Administered 2016-09-16 (×2): 100 ug via INTRAVENOUS

## 2016-09-16 MED ORDER — PROMETHAZINE HCL 25 MG/ML IJ SOLN: 6.2500 mg | INTRAMUSCULAR | Status: DC | PRN

## 2016-09-16 MED ORDER — SUGAMMADEX SODIUM 200 MG/2ML IV SOLN
INTRAVENOUS | Status: DC | PRN
  Administered 2016-09-16: 130 mg via INTRAVENOUS

## 2016-09-16 MED ORDER — ONDANSETRON HCL 4 MG/2ML IJ SOLN
INTRAMUSCULAR | Status: DC | PRN
Start: 2016-09-16 — End: 2016-09-16
  Administered 2016-09-16: 4 mg via INTRAVENOUS

## 2016-09-16 NOTE — H&P (Signed)
Patient seen and examined, H&P per clinic note on 10/2. No significant changes. Images reviewed, and procedure(s) planned accordingly.  60 yo former smoker with Hx of L sided Small cell lung ca, now with LLL atelectasis vs collapse, seen for evaluation of bronchoscopy.   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 CT reviewed, there is a new left small pleural effusion, the left lower lobe bronchus is somewhat obscured, either by mucous plug or complete collapse. There is a consideration for tumor recurrence versus mucus plugging versus lung collapse  Collapse of left lung Possible etiologies include: Mucus plugging, new mass, atelectasis or tumor recurrence  Plan: -Bronchoscopic evaluation warranted - EBUS and regular bronch    I, the ordering provider, attest that I have discussed with the patient the benefits, risks, side effects, alternatives, likelihood of achieving goals and potential problems during recovery for the procedure that I have provided informed consent.   Vilinda Boehringer, MD [facility] Pager [phone] (please enter 7-digits) On Call Pager - [phone] (please enter 7-digits) Clinic - [phone]

## 2016-09-16 NOTE — Anesthesia Postprocedure Evaluation (Signed)
Anesthesia Post Note  Patient: Jill Gross  Procedure(s) Performed: Procedure(s) (LRB): ENDOBRONCHIAL ULTRASOUND (N/A)  Patient location during evaluation: PACU Anesthesia Type: General Level of consciousness: awake and alert Pain management: pain level controlled Vital Signs Assessment: post-procedure vital signs reviewed and stable Respiratory status: spontaneous breathing, nonlabored ventilation, respiratory function stable and patient connected to nasal cannula oxygen Cardiovascular status: blood pressure returned to baseline and stable Postop Assessment: no signs of nausea or vomiting Anesthetic complications: no    Last Vitals:  Vitals:   09/16/16 1653 09/16/16 1702  BP:    Pulse: 100 98  Resp: 13 16  Temp:  36.2 C    Last Pain:  Vitals:   09/16/16 1650  TempSrc:   PainSc: 0-No pain                 Precious Haws Madysyn Hanken

## 2016-09-16 NOTE — Anesthesia Preprocedure Evaluation (Signed)
Anesthesia Evaluation  Patient identified by MRN, date of birth, ID band Patient awake    Reviewed: Allergy & Precautions, NPO status , Patient's Chart, lab work & pertinent test results  History of Anesthesia Complications Negative for: history of anesthetic complications  Airway Mallampati: II  TM Distance: >3 FB     Dental  (+) Upper Dentures, Lower Dentures   Pulmonary shortness of breath, neg sleep apnea, COPD,  COPD inhaler, former smoker,    breath sounds clear to auscultation- rhonchi (-) wheezing      Cardiovascular hypertension, (-) CAD and (-) Past MI  Rhythm:Regular Rate:Normal - Systolic murmurs and - Diastolic murmurs    Neuro/Psych Anxiety Depression    GI/Hepatic Neg liver ROS, GERD  ,  Endo/Other  neg diabetesHypothyroidism   Renal/GU negative Renal ROS     Musculoskeletal  (+) Arthritis , Osteoarthritis,    Abdominal (+) - obese,   Peds  Hematology  (+) anemia ,   Anesthesia Other Findings Past Medical History: No date: Allergy No date: Anemia     Comment: pernicious No date: Anxiety No date: Arthritis No date: Atrophic vaginitis No date: COPD (chronic obstructive pulmonary disease) (* No date: Depression No date: Diverticulosis No date: Dyspnea No date: Fatigue No date: GERD (gastroesophageal reflux disease) 04/2016: History of chemotherapy No date: Hyperlipidemia No date: Hypothyroid No date: Insomnia No date: Low back pain No date: Lumbago 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[place]) No date: Osteopenia 01/2016: Small cell carcinoma of left lung (HCC)     Comment: rad + chemo tx's No date: Tobacco use No date: Vitamin B12 deficiency   Reproductive/Obstetrics                             Anesthesia Physical Anesthesia Plan  ASA: III  Anesthesia Plan: General   Post-op Pain Management:    Induction: Intravenous  Airway Management  Planned: Oral ETT  Additional Equipment:   Intra-op Plan:   Post-operative Plan: Extubation in OR  Informed Consent: I have reviewed the patients History and Physical, chart, labs and discussed the procedure including the risks, benefits and alternatives for the proposed anesthesia with the patient or authorized representative who has indicated his/her understanding and acceptance.   Dental advisory given  Plan Discussed with: Anesthesiologist and CRNA  Anesthesia Plan Comments:         Anesthesia Quick Evaluation

## 2016-09-16 NOTE — Transfer of Care (Signed)
Immediate Anesthesia Transfer of Care Note  Patient: Jill Gross  Procedure(s) Performed: Procedure(s): ENDOBRONCHIAL ULTRASOUND (N/A)  Patient Location: PACU  Anesthesia Type:General  Level of Consciousness: awake and patient cooperative  Airway & Oxygen Therapy: Patient Spontanous Breathing and Patient connected to face mask oxygen  Post-op Assessment: Report given to RN and Post -op Vital signs reviewed and stable  Post vital signs: Reviewed and stable  Last Vitals:  Vitals:   09/16/16 1408 09/16/16 1617  BP: 123/66 (P) 119/64  Pulse: (!) 116 96  Resp: (!) 22 (!) (P) 24  Temp: 36.8 C (P) 36.2 C    Last Pain:  Vitals:   09/16/16 1408  TempSrc: Oral  PainSc: 0-No pain         Complications: No apparent anesthesia complications

## 2016-09-16 NOTE — Anesthesia Procedure Notes (Signed)
Procedure Name: Intubation Date/Time: 09/16/2016 3:27 PM Performed by: Dionne Bucy Pre-anesthesia Checklist: Patient identified, Patient being monitored, Timeout performed, Emergency Drugs available and Suction available Patient Re-evaluated:Patient Re-evaluated prior to inductionOxygen Delivery Method: Circle system utilized Preoxygenation: Pre-oxygenation with 100% oxygen Intubation Type: IV induction Ventilation: Mask ventilation without difficulty Laryngoscope Size: Mac and 3 Grade View: Grade I Tube type: Oral Tube size: 8.0 mm Number of attempts: 1 Airway Equipment and Method: Stylet Placement Confirmation: ETT inserted through vocal cords under direct vision,  positive ETCO2 and breath sounds checked- equal and bilateral Secured at: 21 cm Tube secured with: Tape Dental Injury: Teeth and Oropharynx as per pre-operative assessment

## 2016-09-16 NOTE — Op Note (Signed)
[facility] Patient Name: Jill Gross Procedure Date: 09/16/2016 2:43 PM MRN: [id number] Account #: [id number] Date of Birth: 1956-02-06 Admit Type: Outpatient Age: 60 Room: [facility] PROCEDURE RM 01 Gender: Female Note Status: Finalized Attending MD: Vilinda Boehringer , MD Procedure:         Bronchoscopy Indications:       Left lower lobe mass Providers:         Vilinda Boehringer, MD Referring MD:       Medicines:         General Anesthesia Complications:     No immediate complications. Estimated blood loss: Minimal Procedure:         Pre-Anesthesia Assessment:                    - A History and Physical has been performed. Patient meds                     and allergies have been reviewed. The risks and benefits                     of the procedure and the sedation options and risks were                     discussed with the patient. All questions were answered                     and informed consent was obtained. Patient identification                     and proposed procedure were verified prior to the                     procedure by the physician in the pre-procedure area.                     Mental Status Examination: alert and oriented. Respiratory                     Examination: clear to auscultation. CV Examination: normal                     and RRR, no murmurs, no S3 or S4. After reviewing the                     risks and benefits, the patient was deemed in satisfactory                     condition to undergo the procedure. The anesthesia plan                     was to use general anesthesia. Immediately prior to                     administration of medications, the patient was re-assessed                     for adequacy to receive sedatives. The heart rate,                     respiratory rate, oxygen saturations, blood pressure,                     adequacy of pulmonary ventilation, and response to care  were monitored throughout the  procedure. The physical                     status of the patient was re-assessed after the procedure.                    After obtaining informed consent, the bronchoscope was                     passed under direct vision. Throughout the procedure, the                     patient's blood pressure, pulse, and oxygen saturations                     were monitored continuously. the Bronchofibervideoscope                     Olympus [id number] was introduced through the                     nose, via the endotracheal tube (the patient was intubated                     for the procedure) and advanced to the trachea. The                     procedure was accomplished without difficulty. Findings:      Bronchoalveolar lavage was performed in the left lower lobe of the lung       and sent for cell count, bacterial culture, viral smears & culture, and       fungal & AFB analysis and cytology. 30 mL of fluid were instilled. 10 mL       were returned. The return was cloudy. Mucous plugs were present in the       return fluid.      Endobronchial biopsies of a lesion were performed in the left lower lobe       using a a 19 gauge needle and sent for histopathology examination. Two       samples were obtained.      Brushings of a lesion were obtained in the left lower lobe with a       cytology brush and sent for histopathology examination. One sample was       obtained.      Additional findings: the distal LMS bronchus with extrinsic compression,       some small mucus plugs aspirated from LLL (anterior and media lobes).       Difficult to advance scope beyond the distal left mainstem bronchus       towards the anterior and medial lobes due to extrinsic compressions. FNA       of LLL, Brushing of LLL, BAL of LLL (cyto and micro). Impression:        - Left lower lobe extrinsic compression                    - Bronchoalveolar lavage was performed.                    - An endobronchial biopsy  was performed.                    - Brushings were obtained. Recommendation:    - Await test results. Vilinda Boehringer, MD 09/16/2016  4:12:52 PM This report has been signed electronically. Number of Addenda: 0 Note Initiated On: 09/16/2016 2:43 PM      [facility]

## 2016-09-16 NOTE — Discharge Instructions (Signed)

## 2016-09-17 ENCOUNTER — Encounter: Payer: Self-pay | Admitting: Internal Medicine

## 2016-09-18 LAB — CYTOLOGY - NON PAP

## 2016-09-19 LAB — CULTURE, BAL-QUANTITATIVE W GRAM STAIN

## 2016-09-19 LAB — CULTURE, BAL-QUANTITATIVE: CULTURE: NO GROWTH

## 2016-09-22 NOTE — Progress Notes (Signed)
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  INTERVAL HISTORY: Patient returns to clinic today for further evaluation and discussion of her feet and pathology results. She continues to have increased weakness and fatigue. She has a poor appetite. She also complains of increased cough and shortness of breath. She also has continued neuropathy particularly in her fingertips. She has no other neurologic complaint. She denies any recent fevers. She denies any chest pain or hemoptysis. She denies any nausea, vomiting, constipation, or diarrhea. She has no urinary complaints. Patient feels generally terrible, but offers no further specific complaints.   REVIEW OF SYSTEMS:   Review of Systems  Constitutional: Positive for malaise/fatigue. Negative for fever and weight loss.  HENT: Negative for sore throat.   Eyes: Negative.   Respiratory: Positive for cough and shortness of breath. Negative for hemoptysis.   Cardiovascular: Negative.  Negative for chest pain.  Gastrointestinal: Negative for nausea and vomiting.  Genitourinary: Negative.   Musculoskeletal: Negative.   Skin: Negative.   Neurological: Positive for sensory change and weakness. Negative for dizziness and tingling.  Psychiatric/Behavioral: Positive for depression.    As per HPI. Otherwise, a complete review of systems is negative.  PAST MEDICAL HISTORY: Past Medical History:  Diagnosis Date  . Allergy   . Anemia    pernicious  . Anxiety   . Arthritis   . Atrophic vaginitis   . COPD (chronic obstructive pulmonary disease) ([place])   . Depression   . Diverticulosis   . Dyspnea   . Fatigue   . GERD (gastroesophageal reflux disease)   . History of  chemotherapy 04/2016  . Hyperlipidemia   . Hypothyroid   . Insomnia   . Low back pain   . Lumbago   . Neuropathy due to chemotherapeutic drug ([place])   . Osteopenia   . Small cell carcinoma of left lung ([place]) 01/2016   rad + chemo tx's  . Tobacco use   . Vitamin B12 deficiency     PAST SURGICAL HISTORY: Past Surgical History:  Procedure Laterality Date  . ABDOMINAL HYSTERECTOMY    . ANKLE SURGERY     x4  . CARPAL TUNNEL RELEASE  April 2015  . CHOLECYSTECTOMY    . ENDOBRONCHIAL ULTRASOUND N/A 01/07/2016   Procedure: ENDOBRONCHIAL ULTRASOUND;  Surgeon: Vilinda Boehringer, MD;  Location: [facility] ORS;  Service: Cardiopulmonary;  Laterality: N/A;  . ENDOBRONCHIAL ULTRASOUND N/A 09/16/2016   Procedure: ENDOBRONCHIAL ULTRASOUND;  Surgeon: Vilinda Boehringer, MD;  Location: [facility] ORS;  Service: Cardiopulmonary;  Laterality: N/A;  . HERNIA REPAIR    . PORTACATH PLACEMENT Right 01/23/2016   Procedure: INSERTION PORT-A-CATH;  Surgeon: [name], MD;  Location: [facility] ORS;  Service: General;  Laterality: Right;    FAMILY HISTORY Family History  Problem Relation Age of Onset  . Emphysema Mother   . Diabetes Father        ADVANCED DIRECTIVES:    HEALTH MAINTENANCE: Social History  Substance Use Topics  . Smoking status: Former Smoker    Packs/day: 1.00    Years: 30.00    Types: Cigarettes    Quit date: 01/11/2016  . Smokeless tobacco: Never Used  . Alcohol use No     Allergies  Allergen Reactions  .  Macrobid [Nitrofurantoin] Anaphylaxis  . Skelaxin [Metaxalone] Anaphylaxis  . Effexor [Venlafaxine]   . Hctz [Hydrochlorothiazide] Other (See Comments)    cramps  . Neurontin [Gabapentin]   . Paxil [Paroxetine Hcl]   . Pravachol [Pravastatin Sodium] Other (See Comments)    aching  . Prozac [Fluoxetine Hcl]   . Sulfur Other (See Comments)    "blisters in mouth"  . Wellbutrin [Bupropion] Itching  . Zoloft [Sertraline Hcl]     Current Outpatient Prescriptions  Medication Sig Dispense  Refill  . albuterol (PROAIR HFA) 108 (90 Base) MCG/ACT inhaler Inhale 2 puffs into the lungs every 4 (four) hours as needed for wheezing or shortness of breath. 8.5 g 3  . AMBULATORY NON FORMULARY MEDICATION Medication Name: incentive spirometer 1 each 0  . amitriptyline (ELAVIL) 10 MG tablet Take 2 tablets (20 mg total) by mouth at bedtime. 60 tablet 12  . cimetidine (TAGAMET) 200 MG tablet Take 150 mg by mouth at bedtime.     . cyanocobalamin (,VITAMIN B-12,) 1000 MCG/ML injection Inject 1 mL (1,000 mcg total) into the muscle once. (Patient taking differently: Inject 1,000 mcg into the muscle every 30 (thirty) days. ) 10 mL 1  . escitalopram (LEXAPRO) 20 MG tablet Take 1 tablet (20 mg total) by mouth daily. 30 tablet 6  . Gadopentetate Dimeglumine (MAGNEVIST IV) Inject into the vein.    Marland Kitchen lansoprazole (PREVACID) 30 MG capsule Take 1 capsule (30 mg total) by mouth daily at 12 noon. 30 capsule 2  . levothyroxine (SYNTHROID, LEVOTHROID) 137 MCG tablet Take 1 tablet (137 mcg total) by mouth daily before breakfast. 30 tablet 12  . lidocaine-prilocaine (EMLA) cream Apply to affected area once 30 g 3  . LORazepam (ATIVAN) 0.5 MG tablet Take 1 tablet (0.5 mg total) by mouth every 8 (eight) hours as needed for anxiety. 30 tablet 0  . ondansetron (ZOFRAN) 8 MG tablet Take 1 tablet (8 mg total) by mouth 2 (two) times daily as needed. Start on the third day after cisplatin chemotherapy. 30 tablet 1  . potassium chloride (KLOR-CON) 20 MEQ packet Take 20 mEq by mouth 2 (two) times daily. 30 packet 2  . potassium chloride, magnesium sulfate in sodium chloride 0.9 % 250 mL Inject into the vein.    Marland Kitchen prochlorperazine (COMPAZINE) 10 MG tablet Take 1 tablet (10 mg total) by mouth every 6 (six) hours as needed (Nausea or vomiting). 30 tablet 1  . promethazine (PHENERGAN) 25 MG tablet Take 1 tablet (25 mg total) by mouth every 6 (six) hours as needed for nausea or vomiting. 30 tablet 3  . simvastatin (ZOCOR) 10 MG  tablet Take 1 tablet (10 mg total) by mouth daily at 6 PM. 30 tablet 6  . zolpidem (AMBIEN) 10 MG tablet Take 1 tablet (10 mg total) by mouth once. (Patient taking differently: Take 10 mg by mouth at bedtime. ) 30 tablet 5  . levofloxacin (LEVAQUIN) 500 MG tablet Take 1 tablet (500 mg total) by mouth daily. 10 tablet 0  . predniSONE (DELTASONE) 10 MG tablet Take 5 tabs ('50mg'$ ) daily for 5 days Take 4 tabs ('40mg'$ ) daily for 5 days Take 3 tabs ('30mg'$ ) daily for 5 days Take 2 tabs ('20mg'$ ) daily for 5 days Take 1 tab ('10mg'$ ) daily for 5 days Take 1/2 tab ('5mg'$ ) daily for 5 days then STOP. 25 tablet 0   No current facility-administered medications for this visit.     OBJECTIVE: Vitals:   09/23/16 1552  BP: 111/77  Pulse: Marland Kitchen)  111  Resp: 18  Temp: 97.7 F (36.5 C)     Body mass index is 26.79 kg/m.    ECOG FS:2 - Symptomatic, <50% confined to bed  General: Well-developed, well-nourished. Sitting in wheelchair Eyes: Pink conjunctiva, anicteric sclera. HEENT: Oropharynx clear without erythema or exudate. Lungs: Clear to auscultation bilaterally. Heart: Tachycardia. No rubs, murmurs, or gallops. Musculoskeletal: No edema, cyanosis, or clubbing. Neuro: Alert, answering all questions appropriately. Cranial nerves grossly intact. Skin: No rashes or petechiae noted. Poor skin turgor. Psych: Normal affect.   LAB RESULTS:  Lab Results  Component Value Date   NA 136 08/29/2016   K 3.6 08/29/2016   CL 100 (L) 08/29/2016   CO2 27 08/29/2016   GLUCOSE 114 (H) 08/29/2016   BUN 10 08/29/2016   CREATININE 1.00 08/29/2016   CALCIUM 9.4 08/29/2016   PROT 7.7 08/29/2016   ALBUMIN 4.0 08/29/2016   AST 26 08/29/2016   ALT 21 08/29/2016   ALKPHOS 54 08/29/2016   BILITOT 0.1 (L) 08/29/2016   GFRNONAA 56 (L) 08/29/2016   GFRAA >60 08/29/2016    Lab Results  Component Value Date   WBC 5.7 08/29/2016   NEUTROABS 3.7 08/29/2016   HGB 10.9 (L) 08/29/2016   HCT 30.8 (L) 08/29/2016   MCV 96.7  08/29/2016   PLT 194 08/29/2016     STUDIES: Ct Chest W Contrast  Result Date: 08/29/2016 CLINICAL DATA:  Restaging small cell lung cancer diagnosed in favor 2017. Previous radiation and chemotherapy. Worsening cough X 2-3. EXAM: CT CHEST WITH CONTRAST TECHNIQUE: Multidetector CT imaging of the chest was performed during intravenous contrast administration. CONTRAST:  47m ISOVUE-300 IOPAMIDOL (ISOVUE-300) INJECTION 61% COMPARISON:  Chest CT 01/02/2016.  PET-CT 01/22/2016 and 05/26/2016. FINDINGS: Cardiovascular: Diffuse atherosclerosis of the aorta, great vessels and coronary arteries. No acute vascular findings are evident. The heart size is normal. There is a stable small pericardial effusion. Right IJ Port-A-Cath tip at the SVC right atrial level. Mediastinum/Nodes: There are no enlarged mediastinal, hilar or axillary lymph nodes. The thyroid gland appears unremarkable. There is mild distal esophageal wall thickening, likely related to radiation therapy. Lungs/Pleura: New small dependent left pleural effusion without associated nodularity. There is recurrent near complete collapse of the left lower lobe. There is abrupt cut off of the left lower lobe bronchus. Patchy airspace opacities are present in the left upper lobe and medially in the right upper lobe. No discrete pulmonary nodules are seen. Upper abdomen: The visualized upper abdomen appears stable without suspicious findings. Musculoskeletal/Chest wall: There is no chest wall mass or suspicious osseous finding. IMPRESSION: 1. Recurrent near complete collapse of the left lower lobe with cut off of the left lower lobe bronchus and a new small left pleural effusion. Given the improvement seen on the most recent PET-CT, this could be secondary to radiation therapy and/or mucous plugging. Tumor recurrence in the left infrahilar region cannot be excluded. Short-term radiographic follow up (3-4 weeks) following respiratory therapy recommended. If the  left lower lobe fails to re-expand, bronchoscopy may be warranted for further evaluation. 2. Additional patchy airspace opacities in both upper lobes, likely inflammatory. 3. No distant metastases demonstrated. Electronically Signed   By: WRichardean SaleM.D.   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  PLAN:    1. 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 Bronchoscopy and biopsy results noted with no obvious evidence of recurrence. Lower lobe collapse appears to be secondary to mucous plugging and radiation changes. There is also suggestion of possible underlying pneumonia. Return to clinic in 1 month for further evaluation.  2. Shortness of breath/cough: Likely secondary to collapsed lung. No obvious evidence of malignancy. Patient was given a prescription for Levaquin 500 mg for 10 days as well as an extended three-week prednisone taper.  3. Poor appetite: Likely multifactorial, consider Megace in the future.  4. Dysphagia: Resolved. 5. Hypokalemia: Potassium now within normal limits continue oral supplementation as prescribed. 6. Peripheral neuropathy: Patient is no longer taking gabapentin. 7. Anemia: Significantly improved, monitor.  Approximately 30 minutes was spent in discussion of which greater than 50% was consultation.  Patient expressed understanding and was in agreement with this plan. She also understands that She can call clinic at any time with any questions, concerns, or complaints.    Lloyd Huger, MD   09/26/2016 4:27 PM

## 2016-09-23 ENCOUNTER — Inpatient Hospital Stay (HOSPITAL_BASED_OUTPATIENT_CLINIC_OR_DEPARTMENT_OTHER): Payer: Managed Care, Other (non HMO) | Admitting: Oncology

## 2016-09-23 VITALS — BP 111/77 | HR 111 | Temp 97.7°F | Resp 18 | Wt 153.7 lb

## 2016-09-23 DIAGNOSIS — Z9221 Personal history of antineoplastic chemotherapy: Secondary | ICD-10-CM

## 2016-09-23 DIAGNOSIS — G4700 Insomnia, unspecified: Secondary | ICD-10-CM

## 2016-09-23 DIAGNOSIS — C3432 Malignant neoplasm of lower lobe, left bronchus or lung: Secondary | ICD-10-CM

## 2016-09-23 DIAGNOSIS — Z452 Encounter for adjustment and management of vascular access device: Secondary | ICD-10-CM

## 2016-09-23 DIAGNOSIS — N952 Postmenopausal atrophic vaginitis: Secondary | ICD-10-CM

## 2016-09-23 DIAGNOSIS — R5383 Other fatigue: Secondary | ICD-10-CM

## 2016-09-23 DIAGNOSIS — K5790 Diverticulosis of intestine, part unspecified, without perforation or abscess without bleeding: Secondary | ICD-10-CM

## 2016-09-23 DIAGNOSIS — E785 Hyperlipidemia, unspecified: Secondary | ICD-10-CM

## 2016-09-23 DIAGNOSIS — Z923 Personal history of irradiation: Secondary | ICD-10-CM

## 2016-09-23 DIAGNOSIS — T451X5S Adverse effect of antineoplastic and immunosuppressive drugs, sequela: Secondary | ICD-10-CM

## 2016-09-23 DIAGNOSIS — Z85118 Personal history of other malignant neoplasm of bronchus and lung: Secondary | ICD-10-CM | POA: Diagnosis not present

## 2016-09-23 DIAGNOSIS — Z79899 Other long term (current) drug therapy: Secondary | ICD-10-CM

## 2016-09-23 DIAGNOSIS — J9819 Other pulmonary collapse: Secondary | ICD-10-CM

## 2016-09-23 DIAGNOSIS — E538 Deficiency of other specified B group vitamins: Secondary | ICD-10-CM

## 2016-09-23 DIAGNOSIS — R0602 Shortness of breath: Secondary | ICD-10-CM | POA: Diagnosis not present

## 2016-09-23 DIAGNOSIS — F329 Major depressive disorder, single episode, unspecified: Secondary | ICD-10-CM

## 2016-09-23 DIAGNOSIS — F419 Anxiety disorder, unspecified: Secondary | ICD-10-CM

## 2016-09-23 DIAGNOSIS — I313 Pericardial effusion (noninflammatory): Secondary | ICD-10-CM

## 2016-09-23 DIAGNOSIS — M545 Low back pain: Secondary | ICD-10-CM

## 2016-09-23 DIAGNOSIS — K229 Disease of esophagus, unspecified: Secondary | ICD-10-CM

## 2016-09-23 DIAGNOSIS — K219 Gastro-esophageal reflux disease without esophagitis: Secondary | ICD-10-CM

## 2016-09-23 DIAGNOSIS — D649 Anemia, unspecified: Secondary | ICD-10-CM

## 2016-09-23 DIAGNOSIS — J90 Pleural effusion, not elsewhere classified: Secondary | ICD-10-CM

## 2016-09-23 DIAGNOSIS — R531 Weakness: Secondary | ICD-10-CM

## 2016-09-23 DIAGNOSIS — J449 Chronic obstructive pulmonary disease, unspecified: Secondary | ICD-10-CM

## 2016-09-23 DIAGNOSIS — R630 Anorexia: Secondary | ICD-10-CM

## 2016-09-23 DIAGNOSIS — Z87891 Personal history of nicotine dependence: Secondary | ICD-10-CM

## 2016-09-23 DIAGNOSIS — I700 Atherosclerosis of aorta: Secondary | ICD-10-CM

## 2016-09-23 DIAGNOSIS — G629 Polyneuropathy, unspecified: Secondary | ICD-10-CM

## 2016-09-23 DIAGNOSIS — M8580 Other specified disorders of bone density and structure, unspecified site: Secondary | ICD-10-CM

## 2016-09-23 DIAGNOSIS — E039 Hypothyroidism, unspecified: Secondary | ICD-10-CM

## 2016-09-23 DIAGNOSIS — M129 Arthropathy, unspecified: Secondary | ICD-10-CM

## 2016-09-23 NOTE — Progress Notes (Signed)
States continues to feel weak, dizzy, and short of breath.

## 2016-09-24 MED ORDER — LEVOFLOXACIN 500 MG PO TABS: 500.0000 mg | ORAL_TABLET | Freq: Every day | ORAL | 0 refills | Status: DC

## 2016-09-24 MED ORDER — PREDNISONE 10 MG PO TABS: ORAL_TABLET | ORAL | 0 refills | Status: DC

## 2016-10-10 ENCOUNTER — Other Ambulatory Visit: Payer: Self-pay

## 2016-10-10 MED ORDER — ZOLPIDEM TARTRATE 10 MG PO TABS: 10.0000 mg | ORAL_TABLET | Freq: Every day | ORAL | 2 refills | Status: DC

## 2016-10-21 ENCOUNTER — Telehealth: Payer: Self-pay | Admitting: Unknown Physician Specialty

## 2016-10-21 NOTE — Telephone Encounter (Signed)
Pt called needs a refill on her Ambien. Pharm is [facility] [place]. Thanks.

## 2016-10-21 NOTE — Telephone Encounter (Signed)
RX was up front, faxed to pharmacy. Will call and let patient know.

## 2016-10-21 NOTE — Telephone Encounter (Signed)
Called and let patient know rx was faxed to pharmacy.

## 2016-10-26 NOTE — Progress Notes (Signed)
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  INTERVAL HISTORY: Patient returns to clinic today for further evaluation she continues to have profound weakness and fatigue. She continues to have shortness of breath and wheezing. She has a poor appetite.  She also has continued neuropathy particularly in her fingertips. She complains of new onset urinary frequency. She has no other neurologic complaints. She denies any recent fevers. She denies any chest pain or hemoptysis. She denies any nausea, vomiting, constipation, or diarrhea. She has no urinary complaints. Patient feels generally terrible, but offers no further specific complaints.   REVIEW OF SYSTEMS:   Review of Systems  Constitutional: Positive for malaise/fatigue. Negative for fever and weight loss.  HENT: Negative for sore throat.   Eyes: Negative.   Respiratory: Positive for cough and shortness of breath. Negative for hemoptysis.   Cardiovascular: Negative.  Negative for chest pain.  Gastrointestinal: Negative for nausea and vomiting.  Genitourinary: Negative.   Musculoskeletal: Negative.   Skin: Negative.   Neurological: Positive for sensory change and weakness. Negative for dizziness and tingling.  Psychiatric/Behavioral: Positive for depression.    As per HPI. Otherwise, a complete review of systems is negative.  PAST MEDICAL HISTORY: Past Medical History:  Diagnosis Date  . Allergy   . Anemia    pernicious  . Anxiety   . Arthritis   . Atrophic vaginitis   . COPD (chronic obstructive pulmonary disease) ([place])   . Depression   . Diverticulosis   . Dyspnea   . Fatigue   . GERD (gastroesophageal reflux disease)   . History of  chemotherapy 04/2016  . Hyperlipidemia   . Hypothyroid   . Insomnia   . Low back pain   . Lumbago   . Neuropathy due to chemotherapeutic drug ([place])   . Osteopenia   . Small cell carcinoma of left lung ([place]) 01/2016   rad + chemo tx's  . Tobacco use   . Vitamin B12 deficiency     PAST SURGICAL HISTORY: Past Surgical History:  Procedure Laterality Date  . ABDOMINAL HYSTERECTOMY    . ANKLE SURGERY     x4  . CARPAL TUNNEL RELEASE  April 2015  . CHOLECYSTECTOMY    . ENDOBRONCHIAL ULTRASOUND N/A 01/07/2016   Procedure: ENDOBRONCHIAL ULTRASOUND;  Surgeon: Vilinda Boehringer, MD;  Location: [facility] ORS;  Service: Cardiopulmonary;  Laterality: N/A;  . ENDOBRONCHIAL ULTRASOUND N/A 09/16/2016   Procedure: ENDOBRONCHIAL ULTRASOUND;  Surgeon: Vilinda Boehringer, MD;  Location: [facility] ORS;  Service: Cardiopulmonary;  Laterality: N/A;  . HERNIA REPAIR    . PORTACATH PLACEMENT Right 01/23/2016   Procedure: INSERTION PORT-A-CATH;  Surgeon: [name], MD;  Location: [facility] ORS;  Service: General;  Laterality: Right;    FAMILY HISTORY Family History  Problem Relation Age of Onset  . Emphysema Mother   . Diabetes Father        ADVANCED DIRECTIVES:    HEALTH MAINTENANCE: Social History  Substance Use Topics  . Smoking status: Former Smoker    Packs/day: 1.00    Years: 30.00    Types: Cigarettes    Quit date: 01/11/2016  . Smokeless tobacco: Never Used  . Alcohol use No     Allergies  Allergen Reactions  . Macrobid [  Nitrofurantoin] Anaphylaxis  . Skelaxin [Metaxalone] Anaphylaxis  . Effexor [Venlafaxine]   . Hctz [Hydrochlorothiazide] Other (See Comments)    cramps  . Neurontin [Gabapentin]   . Paxil [Paroxetine Hcl]   . Pravachol [Pravastatin Sodium] Other (See Comments)    aching  . Prozac [Fluoxetine Hcl]   . Sulfur Other (See Comments)    "blisters in mouth"  . Wellbutrin [Bupropion] Itching  . Zoloft [Sertraline Hcl]     Current Outpatient Prescriptions  Medication Sig Dispense  Refill  . albuterol (PROAIR HFA) 108 (90 Base) MCG/ACT inhaler Inhale 2 puffs into the lungs every 4 (four) hours as needed for wheezing or shortness of breath. 8.5 g 3  . AMBULATORY NON FORMULARY MEDICATION Medication Name: incentive spirometer 1 each 0  . amitriptyline (ELAVIL) 10 MG tablet Take 2 tablets (20 mg total) by mouth at bedtime. 60 tablet 12  . cimetidine (TAGAMET) 200 MG tablet Take 150 mg by mouth at bedtime.     . cyanocobalamin (,VITAMIN B-12,) 1000 MCG/ML injection Inject 1 mL (1,000 mcg total) into the muscle once. (Patient taking differently: Inject 1,000 mcg into the muscle every 30 (thirty) days. ) 10 mL 1  . lansoprazole (PREVACID) 30 MG capsule Take 1 capsule (30 mg total) by mouth daily at 12 noon. 30 capsule 2  . levothyroxine (SYNTHROID, LEVOTHROID) 137 MCG tablet Take 1 tablet (137 mcg total) by mouth daily before breakfast. 30 tablet 12  . lidocaine-prilocaine (EMLA) cream Apply to affected area once 30 g 3  . LORazepam (ATIVAN) 0.5 MG tablet Take 1 tablet (0.5 mg total) by mouth every 8 (eight) hours as needed for anxiety. 30 tablet 0  . ondansetron (ZOFRAN) 8 MG tablet Take 1 tablet (8 mg total) by mouth 2 (two) times daily as needed. Start on the third day after cisplatin chemotherapy. 30 tablet 1  . prochlorperazine (COMPAZINE) 10 MG tablet Take 1 tablet (10 mg total) by mouth every 6 (six) hours as needed (Nausea or vomiting). 30 tablet 1  . promethazine (PHENERGAN) 25 MG tablet Take 1 tablet (25 mg total) by mouth every 6 (six) hours as needed for nausea or vomiting. 30 tablet 3  . simvastatin (ZOCOR) 10 MG tablet Take 1 tablet (10 mg total) by mouth daily at 6 PM. 30 tablet 6  . zolpidem (AMBIEN) 10 MG tablet Take 1 tablet (10 mg total) by mouth at bedtime. 30 tablet 2  . dexamethasone (DECADRON) 4 MG tablet Take 1 tablet (4 mg total) by mouth daily. 30 tablet 0  . potassium chloride (KLOR-CON) 20 MEQ packet Take 20 mEq by mouth 2 (two) times daily. (Patient not  taking: Reported on 10/28/2016) 30 packet 2  . potassium chloride, magnesium sulfate in sodium chloride 0.9 % 250 mL Inject into the vein.    . predniSONE (DELTASONE) 10 MG tablet Take 5 tabs ('50mg'$ ) daily for 5 days Take 4 tabs ('40mg'$ ) daily for 5 days Take 3 tabs ('30mg'$ ) daily for 5 days Take 2 tabs ('20mg'$ ) daily for 5 days Take 1 tab ('10mg'$ ) daily for 5 days Take 1/2 tab ('5mg'$ ) daily for 5 days then STOP. (Patient not taking: Reported on 10/28/2016) 25 tablet 0   No current facility-administered medications for this visit.    Facility-Administered Medications Ordered in Other Visits  Medication Dose Route Frequency Provider Last Rate Last Dose  . sodium chloride flush (NS) 0.9 % injection 10 mL  10 mL Intravenous PRN Lloyd Huger, MD   10 mL at 10/28/16  1427    OBJECTIVE: Vitals:   10/28/16 1522  BP: (!) 86/52  Pulse: (!) 106  Resp: 18  Temp: 99 F (37.2 C)     Body mass index is 27.1 kg/m.    ECOG FS:3 - Symptomatic, >50% confined to bed  General: Ill-appearing. Sitting in wheelchair Eyes: Pink conjunctiva, anicteric sclera. HEENT: Oropharynx clear without erythema or exudate. Lungs: Clear to auscultation bilaterally. Heart: Tachycardia. No rubs, murmurs, or gallops. Musculoskeletal: No edema, cyanosis, or clubbing. Neuro: Alert, answering all questions appropriately. Cranial nerves grossly intact. Skin: No rashes or petechiae noted. Poor skin turgor. Psych: Normal affect.   LAB RESULTS:  Lab Results  Component Value Date   NA 136 08/29/2016   K 3.6 08/29/2016   CL 100 (L) 08/29/2016   CO2 27 08/29/2016   GLUCOSE 114 (H) 08/29/2016   BUN 10 08/29/2016   CREATININE 1.00 08/29/2016   CALCIUM 9.4 08/29/2016   PROT 7.7 08/29/2016   ALBUMIN 4.0 08/29/2016   AST 26 08/29/2016   ALT 21 08/29/2016   ALKPHOS 54 08/29/2016   BILITOT 0.1 (L) 08/29/2016   GFRNONAA 56 (L) 08/29/2016   GFRAA >60 08/29/2016    Lab Results  Component Value Date   WBC 5.7 08/29/2016     NEUTROABS 3.7 08/29/2016   HGB 10.9 (L) 08/29/2016   HCT 30.8 (L) 08/29/2016   MCV 96.7 08/29/2016   PLT 194 08/29/2016     STUDIES: No results found.  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  PLAN:    1. 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 Bronchoscopy and biopsy results noted with no obvious evidence of recurrence either. Lower lobe collapse appears to be secondary to mucous plugging and radiation changes. Will repeat CT scan in December or January.  2. Shortness of breath/cough: Likely secondary to collapsed lung. No obvious evidence of malignancy. Patient with no improvement despite 10 days of Levaquin and extended prednisone taper. She was given 4 mg of Decadron daily today.   3. Poor appetite: Likely multifactorial, Decadron as above.  4. Dysphagia: Resolved. 5. Hypokalemia: Potassium now within normal limits continue oral supplementation as prescribed. 6. Peripheral neuropathy: Patient is no longer taking gabapentin. 7. Anemia: Significantly improved, monitor. 8. Weakness and fatigue: Unclear etiology, but likely multifactorial. Thyroid studies pending at time of dictation. 9. Disposition: Return to clinic in 1 month for further evaluation.  Approximately 30 minutes was spent in discussion of which greater than 50% was consultation.  Patient expressed understanding and was in agreement with this plan. She also understands that She can call clinic at any time with any questions, concerns, or complaints.    Lloyd Huger, MD   10/28/2016 3:44 PM

## 2016-10-28 ENCOUNTER — Inpatient Hospital Stay: Payer: Managed Care, Other (non HMO) | Attending: Oncology | Admitting: Oncology

## 2016-10-28 ENCOUNTER — Inpatient Hospital Stay: Payer: Managed Care, Other (non HMO)

## 2016-10-28 ENCOUNTER — Other Ambulatory Visit: Payer: Self-pay | Admitting: Oncology

## 2016-10-28 VITALS — BP 86/52 | HR 106 | Temp 99.0°F | Resp 18 | Wt 155.4 lb

## 2016-10-28 DIAGNOSIS — Z95828 Presence of other vascular implants and grafts: Secondary | ICD-10-CM

## 2016-10-28 DIAGNOSIS — K5790 Diverticulosis of intestine, part unspecified, without perforation or abscess without bleeding: Secondary | ICD-10-CM | POA: Insufficient documentation

## 2016-10-28 DIAGNOSIS — R531 Weakness: Secondary | ICD-10-CM | POA: Insufficient documentation

## 2016-10-28 DIAGNOSIS — M129 Arthropathy, unspecified: Secondary | ICD-10-CM | POA: Diagnosis not present

## 2016-10-28 DIAGNOSIS — D510 Vitamin B12 deficiency anemia due to intrinsic factor deficiency: Secondary | ICD-10-CM | POA: Diagnosis not present

## 2016-10-28 DIAGNOSIS — J449 Chronic obstructive pulmonary disease, unspecified: Secondary | ICD-10-CM | POA: Diagnosis not present

## 2016-10-28 DIAGNOSIS — R0602 Shortness of breath: Secondary | ICD-10-CM | POA: Insufficient documentation

## 2016-10-28 DIAGNOSIS — Z9221 Personal history of antineoplastic chemotherapy: Secondary | ICD-10-CM | POA: Insufficient documentation

## 2016-10-28 DIAGNOSIS — N952 Postmenopausal atrophic vaginitis: Secondary | ICD-10-CM | POA: Insufficient documentation

## 2016-10-28 DIAGNOSIS — M545 Low back pain: Secondary | ICD-10-CM | POA: Diagnosis not present

## 2016-10-28 DIAGNOSIS — R350 Frequency of micturition: Secondary | ICD-10-CM | POA: Diagnosis not present

## 2016-10-28 DIAGNOSIS — C3432 Malignant neoplasm of lower lobe, left bronchus or lung: Secondary | ICD-10-CM

## 2016-10-28 DIAGNOSIS — Z85118 Personal history of other malignant neoplasm of bronchus and lung: Secondary | ICD-10-CM | POA: Insufficient documentation

## 2016-10-28 DIAGNOSIS — Z87891 Personal history of nicotine dependence: Secondary | ICD-10-CM

## 2016-10-28 DIAGNOSIS — R05 Cough: Secondary | ICD-10-CM | POA: Diagnosis not present

## 2016-10-28 DIAGNOSIS — E559 Vitamin D deficiency, unspecified: Secondary | ICD-10-CM | POA: Insufficient documentation

## 2016-10-28 DIAGNOSIS — R5383 Other fatigue: Secondary | ICD-10-CM | POA: Diagnosis not present

## 2016-10-28 DIAGNOSIS — K219 Gastro-esophageal reflux disease without esophagitis: Secondary | ICD-10-CM | POA: Insufficient documentation

## 2016-10-28 DIAGNOSIS — E039 Hypothyroidism, unspecified: Secondary | ICD-10-CM

## 2016-10-28 DIAGNOSIS — Z923 Personal history of irradiation: Secondary | ICD-10-CM | POA: Insufficient documentation

## 2016-10-28 DIAGNOSIS — Z79899 Other long term (current) drug therapy: Secondary | ICD-10-CM

## 2016-10-28 DIAGNOSIS — M8580 Other specified disorders of bone density and structure, unspecified site: Secondary | ICD-10-CM | POA: Diagnosis not present

## 2016-10-28 DIAGNOSIS — G4700 Insomnia, unspecified: Secondary | ICD-10-CM | POA: Diagnosis not present

## 2016-10-28 DIAGNOSIS — E785 Hyperlipidemia, unspecified: Secondary | ICD-10-CM | POA: Diagnosis not present

## 2016-10-28 DIAGNOSIS — R062 Wheezing: Secondary | ICD-10-CM | POA: Diagnosis not present

## 2016-10-28 DIAGNOSIS — R630 Anorexia: Secondary | ICD-10-CM | POA: Diagnosis not present

## 2016-10-28 DIAGNOSIS — G629 Polyneuropathy, unspecified: Secondary | ICD-10-CM | POA: Insufficient documentation

## 2016-10-28 DIAGNOSIS — F329 Major depressive disorder, single episode, unspecified: Secondary | ICD-10-CM | POA: Diagnosis not present

## 2016-10-28 DIAGNOSIS — F419 Anxiety disorder, unspecified: Secondary | ICD-10-CM | POA: Diagnosis not present

## 2016-10-28 DIAGNOSIS — C3402 Malignant neoplasm of left main bronchus: Secondary | ICD-10-CM

## 2016-10-28 DIAGNOSIS — M818 Other osteoporosis without current pathological fracture: Secondary | ICD-10-CM | POA: Diagnosis not present

## 2016-10-28 LAB — URINALYSIS COMPLETE WITH MICROSCOPIC (ARMC ONLY)
Bilirubin Urine: NEGATIVE
Glucose, UA: NEGATIVE mg/dL
Ketones, ur: NEGATIVE mg/dL
Nitrite: POSITIVE — AB
PH: 5 (ref 5.0–8.0)
PROTEIN: 100 mg/dL — AB
Specific Gravity, Urine: 1.023 (ref 1.005–1.030)

## 2016-10-28 LAB — COMPREHENSIVE METABOLIC PANEL
ALT: 22 U/L (ref 14–54)
AST: 26 U/L (ref 15–41)
Albumin: 3.5 g/dL (ref 3.5–5.0)
Alkaline Phosphatase: 72 U/L (ref 38–126)
Anion gap: 10 (ref 5–15)
BILIRUBIN TOTAL: 1.1 mg/dL (ref 0.3–1.2)
BUN: 18 mg/dL (ref 6–20)
CO2: 25 mmol/L (ref 22–32)
CREATININE: 1.17 mg/dL — AB (ref 0.44–1.00)
Calcium: 8.8 mg/dL — ABNORMAL LOW (ref 8.9–10.3)
Chloride: 93 mmol/L — ABNORMAL LOW (ref 101–111)
GFR calc Af Amer: 57 mL/min — ABNORMAL LOW (ref >60)
GFR, EST NON AFRICAN AMERICAN: 50 mL/min — AB (ref >60)
Glucose, Bld: 172 mg/dL — ABNORMAL HIGH (ref 65–99)
POTASSIUM: 3.7 mmol/L (ref 3.5–5.1)
Sodium: 128 mmol/L — ABNORMAL LOW (ref 135–145)
TOTAL PROTEIN: 7.3 g/dL (ref 6.5–8.1)

## 2016-10-28 LAB — CBC WITH DIFFERENTIAL/PLATELET
BASOS ABS: 0 10*3/uL (ref 0–0.1)
Basophils Relative: 0 %
Eosinophils Absolute: 0 10*3/uL (ref 0–0.7)
Eosinophils Relative: 0 %
HEMATOCRIT: 33.7 % — AB (ref 35.0–47.0)
Hemoglobin: 11.5 g/dL — ABNORMAL LOW (ref 12.0–16.0)
LYMPHS ABS: 1 10*3/uL (ref 1.0–3.6)
LYMPHS PCT: 11 %
MCH: 31.9 pg (ref 26.0–34.0)
MCHC: 34 g/dL (ref 32.0–36.0)
MCV: 93.9 fL (ref 80.0–100.0)
MONO ABS: 0.3 10*3/uL (ref 0.2–0.9)
MONOS PCT: 4 %
NEUTROS ABS: 7.4 10*3/uL — AB (ref 1.4–6.5)
Neutrophils Relative %: 85 %
Platelets: 195 10*3/uL (ref 150–440)
RBC: 3.59 MIL/uL — ABNORMAL LOW (ref 3.80–5.20)
RDW: 16.1 % — AB (ref 11.5–14.5)
WBC: 8.7 10*3/uL (ref 3.6–11.0)

## 2016-10-28 MED ORDER — SULFAMETHOXAZOLE-TRIMETHOPRIM 800-160 MG PO TABS: 1.0000 | ORAL_TABLET | Freq: Two times a day (BID) | ORAL | 0 refills | Status: DC

## 2016-10-28 MED ORDER — HEPARIN SOD (PORK) LOCK FLUSH 100 UNIT/ML IV SOLN
500.0000 [IU] | Freq: Once | INTRAVENOUS | Status: AC
  Administered 2016-10-28: 500 [IU] via INTRAVENOUS

## 2016-10-28 MED ORDER — SODIUM CHLORIDE 0.9% FLUSH
10.0000 mL | INTRAVENOUS | Status: DC | PRN
  Administered 2016-10-28: 10 mL via INTRAVENOUS
  Filled 2016-10-28: qty 10

## 2016-10-28 MED ORDER — DEXAMETHASONE 4 MG PO TABS: 4.0000 mg | ORAL_TABLET | Freq: Every day | ORAL | 0 refills | Status: DC

## 2016-10-28 NOTE — Progress Notes (Signed)
Patient has not been feeling well and has been declining in the last 3 days.  She has issues of loss of appetite, leg weakness, and urinary incontinence.  In her husbands opinion she felt slightly better while taking the 30 day Prednisone.  Blood pressure is low today at 86/52 today.

## 2016-10-29 LAB — THYROID PANEL WITH TSH
Free Thyroxine Index: <0.1 — ABNORMAL LOW (ref 1.2–4.9)
T3 UPTAKE RATIO: 15 % — AB (ref 24–39)
TSH: 250.5 u[IU]/mL — AB (ref 0.450–4.500)

## 2016-10-31 ENCOUNTER — Encounter: Payer: Self-pay | Admitting: Emergency Medicine

## 2016-10-31 ENCOUNTER — Inpatient Hospital Stay
Admission: EM | Admit: 2016-10-31 | Discharge: 2016-11-05 | DRG: 194 | Disposition: A | Discharge status: Home Health | Payer: Managed Care, Other (non HMO) | Attending: Internal Medicine | Admitting: Internal Medicine

## 2016-10-31 ENCOUNTER — Emergency Department: Payer: Managed Care, Other (non HMO)

## 2016-10-31 ENCOUNTER — Other Ambulatory Visit: Payer: Self-pay

## 2016-10-31 DIAGNOSIS — Z9221 Personal history of antineoplastic chemotherapy: Secondary | ICD-10-CM | POA: Diagnosis not present

## 2016-10-31 DIAGNOSIS — R531 Weakness: Secondary | ICD-10-CM | POA: Diagnosis not present

## 2016-10-31 DIAGNOSIS — Z87891 Personal history of nicotine dependence: Secondary | ICD-10-CM

## 2016-10-31 DIAGNOSIS — N179 Acute kidney failure, unspecified: Secondary | ICD-10-CM | POA: Diagnosis present

## 2016-10-31 DIAGNOSIS — E871 Hypo-osmolality and hyponatremia: Secondary | ICD-10-CM | POA: Diagnosis present

## 2016-10-31 DIAGNOSIS — Z881 Allergy status to other antibiotic agents status: Secondary | ICD-10-CM | POA: Diagnosis not present

## 2016-10-31 DIAGNOSIS — N390 Urinary tract infection, site not specified: Secondary | ICD-10-CM | POA: Diagnosis present

## 2016-10-31 DIAGNOSIS — E785 Hyperlipidemia, unspecified: Secondary | ICD-10-CM | POA: Diagnosis present

## 2016-10-31 DIAGNOSIS — J440 Chronic obstructive pulmonary disease with acute lower respiratory infection: Secondary | ICD-10-CM | POA: Diagnosis present

## 2016-10-31 DIAGNOSIS — Z7952 Long term (current) use of systemic steroids: Secondary | ICD-10-CM

## 2016-10-31 DIAGNOSIS — Z85118 Personal history of other malignant neoplasm of bronchus and lung: Secondary | ICD-10-CM

## 2016-10-31 DIAGNOSIS — Z923 Personal history of irradiation: Secondary | ICD-10-CM | POA: Diagnosis not present

## 2016-10-31 DIAGNOSIS — Z888 Allergy status to other drugs, medicaments and biological substances status: Secondary | ICD-10-CM | POA: Diagnosis not present

## 2016-10-31 DIAGNOSIS — E860 Dehydration: Secondary | ICD-10-CM | POA: Diagnosis present

## 2016-10-31 DIAGNOSIS — K219 Gastro-esophageal reflux disease without esophagitis: Secondary | ICD-10-CM | POA: Diagnosis present

## 2016-10-31 DIAGNOSIS — E039 Hypothyroidism, unspecified: Secondary | ICD-10-CM | POA: Diagnosis present

## 2016-10-31 DIAGNOSIS — D510 Vitamin B12 deficiency anemia due to intrinsic factor deficiency: Secondary | ICD-10-CM | POA: Diagnosis present

## 2016-10-31 DIAGNOSIS — J189 Pneumonia, unspecified organism: Secondary | ICD-10-CM | POA: Diagnosis present

## 2016-10-31 DIAGNOSIS — M6281 Muscle weakness (generalized): Secondary | ICD-10-CM

## 2016-10-31 DIAGNOSIS — R0902 Hypoxemia: Secondary | ICD-10-CM | POA: Diagnosis present

## 2016-10-31 DIAGNOSIS — R262 Difficulty in walking, not elsewhere classified: Secondary | ICD-10-CM

## 2016-10-31 DIAGNOSIS — B9620 Unspecified Escherichia coli [E. coli] as the cause of diseases classified elsewhere: Secondary | ICD-10-CM | POA: Diagnosis present

## 2016-10-31 LAB — BASIC METABOLIC PANEL
ANION GAP: 15 (ref 5–15)
BUN: 20 mg/dL (ref 6–20)
CO2: 20 mmol/L — AB (ref 22–32)
Calcium: 9.5 mg/dL (ref 8.9–10.3)
Chloride: 94 mmol/L — ABNORMAL LOW (ref 101–111)
Creatinine, Ser: 1.32 mg/dL — ABNORMAL HIGH (ref 0.44–1.00)
GFR, EST AFRICAN AMERICAN: 50 mL/min — AB (ref >60)
GFR, EST NON AFRICAN AMERICAN: 43 mL/min — AB (ref >60)
GLUCOSE: 236 mg/dL — AB (ref 65–99)
POTASSIUM: 4 mmol/L (ref 3.5–5.1)
Sodium: 129 mmol/L — ABNORMAL LOW (ref 135–145)

## 2016-10-31 LAB — URINALYSIS COMPLETE WITH MICROSCOPIC (ARMC ONLY)
Bilirubin Urine: NEGATIVE
Glucose, UA: NEGATIVE mg/dL
NITRITE: POSITIVE — AB
PH: 5 (ref 5.0–8.0)
PROTEIN: NEGATIVE mg/dL
Specific Gravity, Urine: 1.01 (ref 1.005–1.030)

## 2016-10-31 LAB — TROPONIN I: Troponin I: <0.03 ng/mL (ref <0.03)

## 2016-10-31 LAB — CBC
HEMATOCRIT: 35.4 % (ref 35.0–47.0)
HEMOGLOBIN: 12.2 g/dL (ref 12.0–16.0)
MCH: 32.1 pg (ref 26.0–34.0)
MCHC: 34.5 g/dL (ref 32.0–36.0)
MCV: 93 fL (ref 80.0–100.0)
Platelets: 216 10*3/uL (ref 150–440)
RBC: 3.81 MIL/uL (ref 3.80–5.20)
RDW: 16.4 % — ABNORMAL HIGH (ref 11.5–14.5)
WBC: 9.7 10*3/uL (ref 3.6–11.0)

## 2016-10-31 LAB — URINE CULTURE

## 2016-10-31 MED ORDER — AMITRIPTYLINE HCL 10 MG PO TABS
20.0000 mg | ORAL_TABLET | Freq: Every day | ORAL | Status: DC
  Administered 2016-10-31 – 2016-11-04 (×5): 20 mg via ORAL
  Filled 2016-10-31 (×6): qty 2

## 2016-10-31 MED ORDER — IPRATROPIUM-ALBUTEROL 0.5-2.5 (3) MG/3ML IN SOLN
3.0000 mL | Freq: Once | RESPIRATORY_TRACT | Status: AC
  Administered 2016-10-31: 3 mL via RESPIRATORY_TRACT
  Filled 2016-10-31: qty 3

## 2016-10-31 MED ORDER — ZOLPIDEM TARTRATE 5 MG PO TABS
10.0000 mg | ORAL_TABLET | Freq: Every day | ORAL | Status: DC
  Administered 2016-10-31 – 2016-11-04 (×5): 10 mg via ORAL
  Filled 2016-10-31 (×5): qty 2

## 2016-10-31 MED ORDER — LEVOFLOXACIN IN D5W 750 MG/150ML IV SOLN
750.0000 mg | Freq: Once | INTRAVENOUS | Status: AC
  Administered 2016-10-31: 750 mg via INTRAVENOUS
  Filled 2016-10-31: qty 150

## 2016-10-31 MED ORDER — LORAZEPAM 0.5 MG PO TABS
0.5000 mg | ORAL_TABLET | Freq: Three times a day (TID) | ORAL | Status: DC | PRN
  Administered 2016-11-04: 0.5 mg via ORAL
  Filled 2016-10-31: qty 1

## 2016-10-31 MED ORDER — PROCHLORPERAZINE MALEATE 10 MG PO TABS
10.0000 mg | ORAL_TABLET | Freq: Four times a day (QID) | ORAL | Status: DC | PRN
  Filled 2016-10-31: qty 1

## 2016-10-31 MED ORDER — ENOXAPARIN SODIUM 40 MG/0.4ML ~~LOC~~ SOLN
40.0000 mg | Freq: Every day | SUBCUTANEOUS | Status: DC
  Administered 2016-10-31 – 2016-11-04 (×5): 40 mg via SUBCUTANEOUS
  Filled 2016-10-31 (×5): qty 0.4

## 2016-10-31 MED ORDER — CEFTRIAXONE SODIUM 1 G IJ SOLR: 1.0000 g | INTRAMUSCULAR | Status: DC

## 2016-10-31 MED ORDER — SIMVASTATIN 20 MG PO TABS
10.0000 mg | ORAL_TABLET | Freq: Every day | ORAL | Status: DC
  Administered 2016-11-01 – 2016-11-04 (×4): 10 mg via ORAL
  Filled 2016-10-31 (×4): qty 1

## 2016-10-31 MED ORDER — ALBUTEROL SULFATE (2.5 MG/3ML) 0.083% IN NEBU: 3.0000 mL | INHALATION_SOLUTION | RESPIRATORY_TRACT | Status: DC | PRN

## 2016-10-31 MED ORDER — ONDANSETRON HCL 4 MG/2ML IJ SOLN: 4.0000 mg | Freq: Four times a day (QID) | INTRAMUSCULAR | Status: DC | PRN

## 2016-10-31 MED ORDER — SODIUM CHLORIDE 0.9 % IV SOLN
INTRAVENOUS | Status: DC
Start: 2016-11-01 — End: 2016-11-02
  Administered 2016-10-31 – 2016-11-02 (×4): via INTRAVENOUS

## 2016-10-31 MED ORDER — DEXAMETHASONE 4 MG PO TABS
4.0000 mg | ORAL_TABLET | Freq: Every day | ORAL | Status: DC
  Administered 2016-11-01 – 2016-11-05 (×5): 4 mg via ORAL
  Filled 2016-10-31 (×5): qty 1

## 2016-10-31 MED ORDER — PANTOPRAZOLE SODIUM 40 MG PO TBEC
40.0000 mg | DELAYED_RELEASE_TABLET | Freq: Every day | ORAL | Status: DC
  Administered 2016-11-01 – 2016-11-05 (×5): 40 mg via ORAL
  Filled 2016-10-31 (×5): qty 1

## 2016-10-31 MED ORDER — IPRATROPIUM-ALBUTEROL 0.5-2.5 (3) MG/3ML IN SOLN: 3.0000 mL | RESPIRATORY_TRACT | Status: DC | PRN

## 2016-10-31 MED ORDER — ACETAMINOPHEN 325 MG PO TABS: 650.0000 mg | ORAL_TABLET | Freq: Four times a day (QID) | ORAL | Status: DC | PRN

## 2016-10-31 MED ORDER — DEXTROSE 5 % IV SOLN
500.0000 mg | Freq: Every day | INTRAVENOUS | Status: DC
  Filled 2016-10-31: qty 500

## 2016-10-31 MED ORDER — GUAIFENESIN 100 MG/5ML PO SOLN
5.0000 mL | ORAL | Status: DC | PRN
  Administered 2016-11-02: 22:00:00 100 mg via ORAL
  Filled 2016-10-31: qty 10

## 2016-10-31 MED ORDER — PROMETHAZINE HCL 25 MG PO TABS: 25.0000 mg | ORAL_TABLET | Freq: Four times a day (QID) | ORAL | Status: DC | PRN

## 2016-10-31 MED ORDER — LEVOTHYROXINE SODIUM 25 MCG PO TABS
137.0000 ug | ORAL_TABLET | Freq: Every day | ORAL | Status: DC
  Administered 2016-11-01: 08:00:00 137 ug via ORAL
  Filled 2016-10-31: qty 1

## 2016-10-31 MED ORDER — SODIUM CHLORIDE 0.9 % IV BOLUS (SEPSIS)
1000.0000 mL | Freq: Once | INTRAVENOUS | Status: AC
  Administered 2016-10-31: 1000 mL via INTRAVENOUS

## 2016-10-31 MED ORDER — SODIUM CHLORIDE 0.9 % IV BOLUS (SEPSIS)
1000.0000 mL | Freq: Once | INTRAVENOUS | Status: AC
Start: 2016-10-31 — End: 2016-10-31
  Administered 2016-10-31: 1000 mL via INTRAVENOUS

## 2016-10-31 MED ORDER — CEFTRIAXONE SODIUM-DEXTROSE 1-3.74 GM-% IV SOLR
1.0000 g | Freq: Every day | INTRAVENOUS | Status: DC
  Administered 2016-10-31 – 2016-11-04 (×5): 1 g via INTRAVENOUS
  Filled 2016-10-31 (×5): qty 50

## 2016-10-31 NOTE — ED Notes (Addendum)
Patient transported to X-ray and also asked to give urine sample when she returns.

## 2016-10-31 NOTE — ED Provider Notes (Signed)
[facility] Emergency Department Provider Note  Time seen: 4:47 PM  I have reviewed the triage vital signs and the nursing notes.   HISTORY  Chief Complaint Weakness    HPI Jill Gross is a 60 y.o. female with a past medical history of lung cancer, status post chemotherapy and radiation, status post brain radiation, who presents to the emergency department generalized weakness. According to the patient she had completed chemotherapy and radiation back in July. She had been doing well until approximately one week ago when she began feeling generalized fatigue. Denies any focal deficits. States she just feels very weak. Husband states she has been sleeping throughout the day for the past 2 or 3 days. Patient denies any nausea, vomiting. States one episode of diarrhea today. Denies fever, cough. States mild congestion. Denies chest or abdominal pain. Denies dysuria. She does state increased urinary frequency lately.  Past Medical History:  Diagnosis Date  . Allergy   . Anemia    pernicious  . Anxiety   . Arthritis   . Atrophic vaginitis   . COPD (chronic obstructive pulmonary disease) ([place])   . Depression   . Diverticulosis   . Dyspnea   . Fatigue   . GERD (gastroesophageal reflux disease)   . History of chemotherapy 04/2016  . Hyperlipidemia   . Hypothyroid   . Insomnia   . Low back pain   . Lumbago   . Neuropathy due to chemotherapeutic drug ([place])   . Osteopenia   . Small cell carcinoma of left lung ([place]) 01/2016   rad + chemo tx's  . Tobacco use   . Vitamin B12 deficiency     Patient Active Problem List   Diagnosis Date Noted  . Collapse of left lung 09/08/2016  . Hilar mass   . Mass of lower lobe of left lung   . Primary cancer of bronchus of left lower lobe ([place]) 01/04/2016  . Pernicious anemia 09/10/2015  . Hypothyroidism 09/10/2015  . Osteopenia 09/10/2015  . Insomnia 09/10/2015  . Moderate COPD (chronic obstructive pulmonary  disease) ([place]) 09/10/2015  . Urge incontinence 09/10/2015  . Chronic pain 09/10/2015  . Depression 09/10/2015  . Atrophic vaginitis 09/10/2015  . Low back pain 09/10/2015  . Fatigue 09/10/2015  . Chronic airway obstruction ([place]) 09/10/2015  . Tobacco abuse 09/10/2015  . Hyperlipidemia 09/10/2015  . Hypertension 09/10/2015  . Vitamin B12 deficiency 09/10/2015  . Severe major depression without psychotic features ([place]) 09/10/2015  . Metabolic syndrome 25/42/7062    Past Surgical History:  Procedure Laterality Date  . ABDOMINAL HYSTERECTOMY    . ANKLE SURGERY     x4  . CARPAL TUNNEL RELEASE  April 2015  . CHOLECYSTECTOMY    . ENDOBRONCHIAL ULTRASOUND N/A 01/07/2016   Procedure: ENDOBRONCHIAL ULTRASOUND;  Surgeon: Vilinda Boehringer, MD;  Location: [facility] ORS;  Service: Cardiopulmonary;  Laterality: N/A;  . ENDOBRONCHIAL ULTRASOUND N/A 09/16/2016   Procedure: ENDOBRONCHIAL ULTRASOUND;  Surgeon: Vilinda Boehringer, MD;  Location: [facility] ORS;  Service: Cardiopulmonary;  Laterality: N/A;  . HERNIA REPAIR    . PORTACATH PLACEMENT Right 01/23/2016   Procedure: INSERTION PORT-A-CATH;  Surgeon: [name], MD;  Location: [facility] ORS;  Service: General;  Laterality: Right;    Prior to Admission medications   Medication Sig Start Date End Date Taking? Authorizing Provider  albuterol (PROAIR HFA) 108 (90 Base) MCG/ACT inhaler Inhale 2 puffs into the lungs every 4 (four) hours as needed for wheezing or shortness of breath. 09/08/16  Vilinda Boehringer, MD  AMBULATORY NON FORMULARY MEDICATION Medication Name: incentive spirometer 09/08/16   Vilinda Boehringer, MD  amitriptyline (ELAVIL) 10 MG tablet Take 2 tablets (20 mg total) by mouth at bedtime. 05/20/16   [name], NP  cimetidine (TAGAMET) 200 MG tablet Take 150 mg by mouth at bedtime.     Historical Provider, MD  cyanocobalamin (,VITAMIN B-12,) 1000 MCG/ML injection Inject 1 mL (1,000 mcg total) into the muscle once. Patient taking differently: Inject 1,000 mcg  into the muscle every 30 (thirty) days.  09/10/15   [name], NP  dexamethasone (DECADRON) 4 MG tablet Take 1 tablet (4 mg total) by mouth daily. 10/28/16   Lloyd Huger, MD  lansoprazole (PREVACID) 30 MG capsule Take 1 capsule (30 mg total) by mouth daily at 12 noon. 06/24/16   Noreene Filbert, MD  levothyroxine (SYNTHROID, LEVOTHROID) 137 MCG tablet Take 1 tablet (137 mcg total) by mouth daily before breakfast. 04/29/16   [name], NP  lidocaine-prilocaine (EMLA) cream Apply to affected area once 01/22/16   Lloyd Huger, MD  LORazepam (ATIVAN) 0.5 MG tablet Take 1 tablet (0.5 mg total) by mouth every 8 (eight) hours as needed for anxiety. 08/18/16   Lloyd Huger, MD  ondansetron (ZOFRAN) 8 MG tablet Take 1 tablet (8 mg total) by mouth 2 (two) times daily as needed. Start on the third day after cisplatin chemotherapy. 01/22/16   Lloyd Huger, MD  potassium chloride (KLOR-CON) 20 MEQ packet Take 20 mEq by mouth 2 (two) times daily. Patient not taking: Reported on 10/28/2016 04/14/16   Mayra Reel, NP  potassium chloride, magnesium sulfate in sodium chloride 0.9 % 250 mL Inject into the vein.    Historical Provider, MD  predniSONE (DELTASONE) 10 MG tablet Take 5 tabs ('50mg'$ ) daily for 5 days Take 4 tabs ('40mg'$ ) daily for 5 days Take 3 tabs ('30mg'$ ) daily for 5 days Take 2 tabs ('20mg'$ ) daily for 5 days Take 1 tab ('10mg'$ ) daily for 5 days Take 1/2 tab ('5mg'$ ) daily for 5 days then STOP. Patient not taking: Reported on 10/28/2016 09/24/16   Lloyd Huger, MD  prochlorperazine (COMPAZINE) 10 MG tablet Take 1 tablet (10 mg total) by mouth every 6 (six) hours as needed (Nausea or vomiting). 01/22/16   Lloyd Huger, MD  promethazine (PHENERGAN) 25 MG tablet Take 1 tablet (25 mg total) by mouth every 6 (six) hours as needed for nausea or vomiting. 09/04/16   Lloyd Huger, MD  simvastatin (ZOCOR) 10 MG tablet Take 1 tablet (10 mg total) by mouth daily at 6 PM. 04/04/16    [name], NP  sulfamethoxazole-trimethoprim (BACTRIM DS,SEPTRA DS) 800-160 MG tablet Take 1 tablet by mouth 2 (two) times daily. 10/28/16   Lloyd Huger, MD  zolpidem (AMBIEN) 10 MG tablet Take 1 tablet (10 mg total) by mouth at bedtime. 10/10/16   [name], NP    Allergies  Allergen Reactions  . Macrobid [Nitrofurantoin] Anaphylaxis  . Skelaxin [Metaxalone] Anaphylaxis  . Effexor [Venlafaxine]   . Hctz [Hydrochlorothiazide] Other (See Comments)    cramps  . Neurontin [Gabapentin]   . Paxil [Paroxetine Hcl]   . Pravachol [Pravastatin Sodium] Other (See Comments)    aching  . Prozac [Fluoxetine Hcl]   . Sulfur Other (See Comments)    "blisters in mouth"  . Wellbutrin [Bupropion] Itching  . Zoloft [Sertraline Hcl]     Family History  Problem Relation Age of Onset  . Emphysema Mother   .  Diabetes Father     Social History Social History  Substance Use Topics  . Smoking status: Former Smoker    Packs/day: 1.00    Years: 30.00    Types: Cigarettes    Quit date: 01/11/2016  . Smokeless tobacco: Never Used  . Alcohol use No    Review of Systems Constitutional: Negative for fever. Cardiovascular: Negative for chest pain. Respiratory: Negative for shortness of breath. Gastrointestinal: Negative for abdominal pain, vomiting and diarrhea. Genitourinary: Negative for dysuria.Positive for frequency. Musculoskeletal: Negative for back pain Neurological: Negative for headache. Denies focal weakness or numbness.  10-point ROS otherwise negative.  ____________________________________________   PHYSICAL EXAM:  VITAL SIGNS: ED Triage Vitals  Enc Vitals Group     BP 10/31/16 1549 104/67     Pulse Rate 10/31/16 1549 91     Resp 10/31/16 1549 16     Temp --      Temp src --      SpO2 10/31/16 1549 94 %     Weight 10/31/16 1550 155 lb (70.3 kg)     Height --      Head Circumference --      Peak Flow --      Pain Score 10/31/16 1550 0     Pain Loc --       Pain Edu? --      Excl. in [place]? --     Constitutional: Alert and oriented. Well appearing and in no distress. Eyes: Normal exam ENT   Head: Normocephalic and atraumatic   Mouth/Throat: Mucous membranes are moist. Cardiovascular: Normal rate, regular rhythm. No murmur Respiratory: Normal respiratory effort without tachypnea nor retractionsmild expiratory wheezes bilaterally. Gastrointestinal: Soft and nontender. No distention.   Musculoskeletal: Nontender with normal range of motion in all extremities.  Neurologic:  Normal speech and language. No gross focal neurologic deficits  Skin:  Skin is warm, dry and intact.  Psychiatric: Mood and affect are normal. Speech and behavior are normal.   ____________________________________________    EKG  EKG reviewed and interpreted by myself shows sinus rhythm at 88 bpm, narrow QRS, normal axis, largely normal intervals with nonspecific ST changes.   ____________________________________________    RADIOLOGY  Chest x-ray shows left perihilar opacity.  ____________________________________________   INITIAL IMPRESSION / ASSESSMENT AND PLAN / ED COURSE  Pertinent labs & imaging results that were available during my care of the patient were reviewed by me and considered in my medical decision making (see chart for details).  The patient presents to the emergency department with generalized weakness over the past 1 week, worse over the past 3 days. Largely negative review of systems besides urinary frequency and one episode of diarrhea today. We will check labs, urinalysis. Patient does have a expiratory wheezes on exam we will check a chest x-ray and treated with a DuoNeb. We will IV hydrate while close monitoring in the emergency department.   Chest x-ray shows left-sided opacity. Patient currently satting 88% on room air with no home O2 requirement. Lung sounds are somewhat improved after DuoNeb but continues to be hypoxic. Given the  patient's generalized weakness left-sided pneumonia with hypoxia we will start on IV Levaquin and admitted to the hospital for further workup. Currently pending urinalysis results.  _____________________________________    FINAL CLINICAL IMPRESSION(S) / ED DIAGNOSES  Generalized weakness Community acquired pneumonia   Harvest Dark, MD 10/31/16 [id number]

## 2016-10-31 NOTE — ED Notes (Signed)
Pt has portacath to access for blood.

## 2016-10-31 NOTE — ED Notes (Signed)
Pt transported to room 120. 

## 2016-10-31 NOTE — H&P (Signed)
Jill Gross at [place] NAME: Jill Gross    MR#:  [id number]  DATE OF BIRTH:  09/01/1956  DATE OF ADMISSION:  10/31/2016  PRIMARY CARE PHYSICIAN: [name], NP   REQUESTING/REFERRING PHYSICIAN: Harvest Dark, MD  CHIEF COMPLAINT:   Chief Complaint  Patient presents with  . Weakness   Generalized weakness for 2-3 days. HISTORY OF PRESENT ILLNESS:  Jill Gross  is a 60 y.o. 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 The patient presents to the ED with generalized weakness and poor oral intake for the past 2-3 days. Patient also complains of chills, cough and mild shortness of breath. She denies any other symptoms. Chest x-ray show left perihilar pneumonia.  PAST MEDICAL HISTORY:   Past Medical History:  Diagnosis Date  . Allergy   . Anemia    pernicious  . Anxiety   . Arthritis   . Atrophic vaginitis   . COPD (chronic obstructive pulmonary disease) ([place])   . Depression   . Diverticulosis   . Dyspnea   . Fatigue   . GERD (gastroesophageal reflux disease)   . History of chemotherapy 04/2016  . Hyperlipidemia   . Hypothyroid   . Insomnia   . Low back pain   . Lumbago   . Neuropathy due to chemotherapeutic drug ([place])   . Osteopenia   . Small cell carcinoma of left lung ([place]) 01/2016   rad + chemo tx's  . Tobacco use   . Vitamin B12 deficiency     PAST SURGICAL HISTORY:   Past Surgical History:  Procedure Laterality Date  . ABDOMINAL HYSTERECTOMY    . ANKLE SURGERY     x4  . CARPAL TUNNEL RELEASE  April 2015  . CHOLECYSTECTOMY    . ENDOBRONCHIAL ULTRASOUND N/A 01/07/2016   Procedure: ENDOBRONCHIAL ULTRASOUND;  Surgeon: Vilinda Boehringer, MD;  Location: [facility] ORS;  Service: Cardiopulmonary;  Laterality: N/A;  . ENDOBRONCHIAL ULTRASOUND N/A 09/16/2016   Procedure: ENDOBRONCHIAL ULTRASOUND;  Surgeon: Vilinda Boehringer, MD;  Location: [facility] ORS;   Service: Cardiopulmonary;  Laterality: N/A;  . HERNIA REPAIR    . PORTACATH PLACEMENT Right 01/23/2016   Procedure: INSERTION PORT-A-CATH;  Surgeon: [name], MD;  Location: [facility] ORS;  Service: General;  Laterality: Right;    SOCIAL HISTORY:   Social History  Substance Use Topics  . Smoking status: Former Smoker    Packs/day: 1.00    Years: 30.00    Types: Cigarettes    Quit date: 01/11/2016  . Smokeless tobacco: Never Used  . Alcohol use No    FAMILY HISTORY:   Family History  Problem Relation Age of Onset  . Emphysema Mother   . Diabetes Father     DRUG ALLERGIES:   Allergies  Allergen Reactions  . Macrobid [Nitrofurantoin] Anaphylaxis  . Skelaxin [Metaxalone] Anaphylaxis  . Effexor [Venlafaxine]   . Hctz [Hydrochlorothiazide] Other (See Comments)    cramps  . Neurontin [Gabapentin]   . Paxil [Paroxetine Hcl]   . Pravachol [Pravastatin Sodium] Other (See Comments)    aching  . Prozac [Fluoxetine Hcl]   . Sulfur Other (See Comments)    "blisters in mouth"  . Wellbutrin [Bupropion] Itching  . Zoloft [Sertraline Hcl]     REVIEW OF SYSTEMS:   Review of Systems  Constitutional: Positive for chills and malaise/fatigue. Negative for fever.  HENT: Positive for congestion. Negative for sore throat.  Eyes: Negative for blurred vision and double vision.  Respiratory: Positive for cough and sputum production. Negative for hemoptysis, shortness of breath, wheezing and stridor.   Cardiovascular: Negative for chest pain and leg swelling.  Gastrointestinal: Negative for abdominal pain, blood in stool, constipation, diarrhea, melena, nausea and vomiting.  Genitourinary: Negative for dysuria, frequency and hematuria.  Musculoskeletal: Negative for joint pain.  Skin: Negative for rash.  Neurological: Positive for weakness. Negative for dizziness, focal weakness and loss of consciousness.  Psychiatric/Behavioral: Negative for depression. The patient is not  nervous/anxious.     MEDICATIONS AT HOME:   Prior to Admission medications   Medication Sig Start Date End Date Taking? Authorizing Provider  albuterol (PROAIR HFA) 108 (90 Base) MCG/ACT inhaler Inhale 2 puffs into the lungs every 4 (four) hours as needed for wheezing or shortness of breath. 09/08/16   Vilinda Boehringer, MD  AMBULATORY NON FORMULARY MEDICATION Medication Name: incentive spirometer 09/08/16   Vilinda Boehringer, MD  amitriptyline (ELAVIL) 10 MG tablet Take 2 tablets (20 mg total) by mouth at bedtime. 05/20/16   [name], NP  cimetidine (TAGAMET) 200 MG tablet Take 150 mg by mouth at bedtime.     Historical Provider, MD  cyanocobalamin (,VITAMIN B-12,) 1000 MCG/ML injection Inject 1 mL (1,000 mcg total) into the muscle once. Patient taking differently: Inject 1,000 mcg into the muscle every 30 (thirty) days.  09/10/15   [name], NP  dexamethasone (DECADRON) 4 MG tablet Take 1 tablet (4 mg total) by mouth daily. 10/28/16   Lloyd Huger, MD  lansoprazole (PREVACID) 30 MG capsule Take 1 capsule (30 mg total) by mouth daily at 12 noon. 06/24/16   Noreene Filbert, MD  levothyroxine (SYNTHROID, LEVOTHROID) 137 MCG tablet Take 1 tablet (137 mcg total) by mouth daily before breakfast. 04/29/16   [name], NP  lidocaine-prilocaine (EMLA) cream Apply to affected area once 01/22/16   Lloyd Huger, MD  LORazepam (ATIVAN) 0.5 MG tablet Take 1 tablet (0.5 mg total) by mouth every 8 (eight) hours as needed for anxiety. 08/18/16   Lloyd Huger, MD  ondansetron (ZOFRAN) 8 MG tablet Take 1 tablet (8 mg total) by mouth 2 (two) times daily as needed. Start on the third day after cisplatin chemotherapy. 01/22/16   Lloyd Huger, MD  potassium chloride (KLOR-CON) 20 MEQ packet Take 20 mEq by mouth 2 (two) times daily. Patient not taking: Reported on 10/28/2016 04/14/16   Mayra Reel, NP  potassium chloride, magnesium sulfate in sodium chloride 0.9 % 250 mL Inject into the vein.     Historical Provider, MD  predniSONE (DELTASONE) 10 MG tablet Take 5 tabs ('50mg'$ ) daily for 5 days Take 4 tabs ('40mg'$ ) daily for 5 days Take 3 tabs ('30mg'$ ) daily for 5 days Take 2 tabs ('20mg'$ ) daily for 5 days Take 1 tab ('10mg'$ ) daily for 5 days Take 1/2 tab ('5mg'$ ) daily for 5 days then STOP. Patient not taking: Reported on 10/28/2016 09/24/16   Lloyd Huger, MD  prochlorperazine (COMPAZINE) 10 MG tablet Take 1 tablet (10 mg total) by mouth every 6 (six) hours as needed (Nausea or vomiting). 01/22/16   Lloyd Huger, MD  promethazine (PHENERGAN) 25 MG tablet Take 1 tablet (25 mg total) by mouth every 6 (six) hours as needed for nausea or vomiting. 09/04/16   Lloyd Huger, MD  simvastatin (ZOCOR) 10 MG tablet Take 1 tablet (10 mg total) by mouth daily at 6 PM. 04/04/16   [name], NP  sulfamethoxazole-trimethoprim (BACTRIM DS,SEPTRA DS) 800-160 MG tablet Take 1 tablet by mouth 2 (two) times daily. 10/28/16   Lloyd Huger, MD  zolpidem (AMBIEN) 10 MG tablet Take 1 tablet (10 mg total) by mouth at bedtime. 10/10/16   [name], NP      VITAL SIGNS:  Blood pressure 108/66, pulse 86, temperature (!) 96.8 F (36 C), temperature source Axillary, resp. rate (!) 21, weight 155 lb (70.3 kg), SpO2 93 %.  PHYSICAL EXAMINATION:  Physical Exam  GENERAL:  60 y.o.-year-old patient lying in the bed with no acute distress.  EYES: Pupils equal, round, reactive to light and accommodation. No scleral icterus. Extraocular muscles intact.  HEENT: Head atraumatic, normocephalic. Oropharynx and nasopharynx clear.Dry oral mucosa.  NECK:  Supple, no jugular venous distention. No thyroid enlargement, no tenderness.  LUNGS: Normal breath sounds bilaterally, no wheezing, rales,rhonchi or crepitation. No use of accessory muscles of respiration.  CARDIOVASCULAR: S1, S2 normal. No murmurs, rubs, or gallops.  ABDOMEN: Soft, nontender, nondistended. Bowel sounds present. No organomegaly or mass.    EXTREMITIES: No pedal edema, cyanosis, or clubbing.  NEUROLOGIC: Cranial nerves II through XII are intact. Muscle strength 5/5 in all extremities. Sensation intact. Gait not checked.  PSYCHIATRIC: The patient is alert and oriented x 3.  SKIN: No obvious rash, lesion, or ulcer.   LABORATORY PANEL:   CBC  Recent Labs Lab 10/31/16 1552  WBC 9.7  HGB 12.2  HCT 35.4  PLT 216   ------------------------------------------------------------------------------------------------------------------  Chemistries   Recent Labs Lab 10/28/16 1500 10/31/16 1552  NA 128* 129*  K 3.7 4.0  CL 93* 94*  CO2 25 20*  GLUCOSE 172* 236*  BUN 18 20  CREATININE 1.17* 1.32*  CALCIUM 8.8* 9.5  AST 26  --   ALT 22  --   ALKPHOS 72  --   BILITOT 1.1  --    ------------------------------------------------------------------------------------------------------------------  Cardiac Enzymes  Recent Labs Lab 10/31/16 1552  TROPONINI <0.03   ------------------------------------------------------------------------------------------------------------------  RADIOLOGY:  Dg Chest 2 View  Result Date: 10/31/2016 CLINICAL DATA:  Shortness of breath, weakness. EXAM: CHEST  2 VIEW COMPARISON:  Radiograph of January 23, 2016. FINDINGS: Stable cardiomediastinal silhouette. Atherosclerosis of thoracic aorta is noted. Right internal jugular Port-A-Cath is unchanged in position. No pneumothorax is noted. Stable left basilar scarring is noted. Increased left perihilar opacity is noted concerning for possible pneumonia or edema. Right basilar scarring or atelectasis is noted. Bony thorax is unremarkable. IMPRESSION: Aortic atherosclerosis. Increased left perihilar opacity is noted concerning for pneumonia or edema. Electronically Signed   By: Marijo Conception, M.D.   On: 10/31/2016 19:15      IMPRESSION AND PLAN:   Pneumonia, CAP. The patient will be admitted to medical floor. Start Zithromax and Rocephin,  Robitussin when necessary, DuoNeb when necessary,  follow-up sputum culture and blood culture.  Acute renal failure due to dehydration. IV fluid support.  Chronic hyponatremia. Stable.  Small cell lung cancer, status post chemotherapy and radiation this August. Follow-up oncologist as outpatient.  All the records are reviewed and case discussed with ED provider. Management plans discussed with the patient, her husband and they are in agreement.  CODE STATUS: Full code  TOTAL TIME TAKING CARE OF THIS PATIENT: 55 minutes.    Demetrios Loll M.D on 10/31/2016 at 8:18 PM  Between 7am to 6pm - Pager - [phone]  After 6pm go to [email or web address] - [place] [facility]  Office  [phone]  CC: Primary  care physician; [name], NP   Note: This dictation was prepared with Dragon dictation along with smaller phrase technology. Any transcriptional errors that result from this process are unintentional.

## 2016-10-31 NOTE — ED Notes (Signed)
Patient c/o weakness, cough, and congestion. Patient had 1 episode of diarrhea here in the ED. Patient was recently treated for a UTI.  Patient is cancer patient; cancer is currently in remission, patient is not on any treatment currently.  Patient denies black or bloody stools.

## 2016-10-31 NOTE — ED Notes (Signed)
Admitting MD at bedside at this time.

## 2016-10-31 NOTE — ED Notes (Signed)
Patient states that she was diagnosed with a UTI on Tuesday. Patient was given Sulfa antibiotic but she is allergic to sulfa drug. Patient called her doctor to change antibiotic but she was not able to pick medication up yet.

## 2016-10-31 NOTE — ED Triage Notes (Addendum)
Pt reports feeling "tired" and fatigued x4 days. Pt is in cancer remission.  Pt reports also dx with UTI, was prescribed a sulfa medication and pt is allergic.

## 2016-11-01 LAB — VITAMIN B12: Vitamin B-12: 301 pg/mL (ref 180–914)

## 2016-11-01 LAB — STREP PNEUMONIAE URINARY ANTIGEN: STREP PNEUMO URINARY ANTIGEN: NEGATIVE

## 2016-11-01 MED ORDER — AZITHROMYCIN 500 MG PO TABS
500.0000 mg | ORAL_TABLET | ORAL | Status: DC
  Administered 2016-11-01 – 2016-11-04 (×4): 500 mg via ORAL
  Filled 2016-11-01 (×4): qty 1

## 2016-11-01 MED ORDER — LEVOTHYROXINE SODIUM 100 MCG IV SOLR
100.0000 ug | Freq: Every day | INTRAVENOUS | Status: AC
  Administered 2016-11-01 – 2016-11-02 (×2): 100 ug via INTRAVENOUS
  Filled 2016-11-01 (×2): qty 5

## 2016-11-01 NOTE — Progress Notes (Signed)
PHARMACIST - PHYSICIAN COMMUNICATION DR:   Posey Pronto CONCERNING: Antibiotic IV to Oral Route Change Policy  RECOMMENDATION: This patient is receiving azithromycin by the intravenous route.  Based on criteria approved by the Pharmacy and Therapeutics Committee, the antibiotic(s) is/are being converted to the equivalent oral dose form(s).   DESCRIPTION: These criteria include:  Patient being treated for a respiratory tract infection, urinary tract infection, cellulitis or clostridium difficile associated diarrhea if on metronidazole  The patient is not neutropenic and does not exhibit a GI malabsorption state  The patient is eating (either orally or via tube) and/or has been taking other orally administered medications for a least 24 hours  The patient is improving clinically and has a Tmax < 100.5  If you have questions about this conversion, please contact the [place], PharmD 11/01/16 10:22 AM

## 2016-11-01 NOTE — Progress Notes (Signed)
[place] at [facility]                                                                                                                                                                                  Patient Demographics   Jill Gross, is a 60 y.o. female, DOB - 06/10/1956, NTI:[id number]  Admit date - 10/31/2016   Admitting Physician Demetrios Loll, MD  Outpatient Primary MD for the patient is Jill Haddock, NP   LOS - 1  Subjective: Patient admitted with generalized weakness fevers She was actually seen by Dr. [name] a few days ago laboratory load work showed that her TSH was significantly elevated She was also noticed to have a UTI. Which the patient did not fill her prescription    Review of Systems:   CONSTITUTIONAL: Positive fever. Positive fatigue, positive weakness. No weight gain, no weight loss.  EYES: No blurry or double vision.  ENT: No tinnitus. No postnasal drip. No redness of the oropharynx.  RESPIRATORY: No cough, no wheeze, no hemoptysis. Positive dyspnea.  CARDIOVASCULAR: No chest pain. No orthopnea. No palpitations. No syncope.  GASTROINTESTINAL: No nausea, no vomiting or diarrhea. No abdominal pain. No melena or hematochezia.  GENITOURINARY: No dysuria or hematuria.  ENDOCRINE: No polyuria or nocturia. No heat or cold intolerance.  HEMATOLOGY: No anemia. No bruising. No bleeding.  INTEGUMENTARY: No rashes. No lesions.  MUSCULOSKELETAL: No arthritis. No swelling. No gout.  NEUROLOGIC: No numbness, tingling, or ataxia. No seizure-type activity.  PSYCHIATRIC: No anxiety. No insomnia. No ADD.    Vitals:   Vitals:   10/31/16 2030 10/31/16 2124 10/31/16 2223 11/01/16 0437  BP: 116/66 122/61  (!) 107/55  Pulse: 83 84  80  Resp: (!) 23 18  (!) 1  Temp:  97.4 F (36.3 C)  98.5 F (36.9 C)  TempSrc:  Oral    SpO2: 95% 98% 98% 92%  Weight:  155 lb (70.3 kg)    Height:  '5\' 2"'$  (1.575 m)      Wt Readings from Last 3 Encounters:   10/31/16 155 lb (70.3 kg)  10/28/16 155 lb 6.8 oz (70.5 kg)  09/23/16 153 lb 10.6 oz (69.7 kg)     Intake/Output Summary (Last 24 hours) at 11/01/16 1150 Last data filed at 11/01/16 0300  Gross per 24 hour  Intake              490 ml  Output              200 ml  Net              290 ml    Physical Exam:   GENERAL:  Pleasant-appearing Chronically ill-appearing HEAD, EYES, EARS, NOSE AND THROAT: Atraumatic, normocephalic. Extraocular muscles are intact. Pupils equal and reactive to light. Sclerae anicteric. No conjunctival injection. No oro-pharyngeal erythema.  NECK: Supple. There is no jugular venous distention. No bruits, no lymphadenopathy, no thyromegaly.  HEART: Regular rate and rhythm,. No murmurs, no rubs, no clicks.  LUNGS: Clear to auscultation bilaterally. No rales or rhonchi. No wheezes.  ABDOMEN: Soft, flat, nontender, nondistended. Has good bowel sounds. No hepatosplenomegaly appreciated.  EXTREMITIES: No evidence of any cyanosis, clubbing, or peripheral edema.  +2 pedal and radial pulses bilaterally.  NEUROLOGIC: The patient is alert, awake, and oriented x3 with no focal motor or sensory deficits appreciated bilaterally.  SKIN: Moist and warm with no rashes appreciated.  Psych: Not anxious, depressed LN: No inguinal LN enlargement    Antibiotics   Anti-infectives    Start     Dose/Rate Route Frequency Ordered Stop   11/01/16 2200  azithromycin (ZITHROMAX) 500 mg in dextrose 5 % 250 mL IVPB  Status:  Discontinued     500 mg 250 mL/hr over 60 Minutes Intravenous Daily 10/31/16 2223 11/01/16 1021   11/01/16 2100  azithromycin (ZITHROMAX) tablet 500 mg     500 mg Oral Every 24 hours 11/01/16 1021 11/08/16 2059   11/01/16 0000  cefTRIAXone (ROCEPHIN) IVPB 1 g     1 g 100 mL/hr over 30 Minutes Intravenous Daily 10/31/16 2226 11/07/16 2159   10/31/16 2230  cefTRIAXone (ROCEPHIN) 1 g in dextrose 5 % 50 mL IVPB  Status:  Discontinued     1 g 100 mL/hr over 30 Minutes  Intravenous Every 24 hours 10/31/16 2223 10/31/16 2226   10/31/16 2000  levofloxacin (LEVAQUIN) IVPB 750 mg     750 mg 100 mL/hr over 90 Minutes Intravenous  Once 10/31/16 1952 10/31/16 2125      Medications   Scheduled Meds: . amitriptyline  20 mg Oral QHS  . azithromycin  500 mg Oral Q24H  . cefTRIAXone  1 g Intravenous Daily  . dexamethasone  4 mg Oral Q breakfast  . enoxaparin (LOVENOX) injection  40 mg Subcutaneous QHS  . levothyroxine  100 mcg Intravenous Daily  . pantoprazole  40 mg Oral QAC breakfast  . simvastatin  10 mg Oral q1800  . zolpidem  10 mg Oral QHS   Continuous Infusions: . sodium chloride 100 mL/hr at 11/01/16 0916   PRN Meds:.acetaminophen, albuterol, guaiFENesin, ipratropium-albuterol, LORazepam, ondansetron (ZOFRAN) IV, prochlorperazine, promethazine   Data Review:   Micro Results Recent Results (from the past 240 hour(s))  Urine culture     Status: Abnormal   Collection Time: 10/28/16  3:35 PM  Result Value Ref Range Status   Specimen Description URINE, CLEAN CATCH  Final   Special Requests NONE  Final   Culture >=100,000 COLONIES/mL ESCHERICHIA COLI (A)  Final   Report Status 10/31/2016 FINAL  Final   Organism ID, Bacteria ESCHERICHIA COLI (A)  Final      Susceptibility   Escherichia coli - MIC*    AMPICILLIN >=32 RESISTANT Resistant     CEFAZOLIN <=4 SENSITIVE Sensitive     CEFTRIAXONE <=1 SENSITIVE Sensitive     CIPROFLOXACIN 0.5 SENSITIVE Sensitive     GENTAMICIN <=1 SENSITIVE Sensitive     IMIPENEM <=0.25 SENSITIVE Sensitive     NITROFURANTOIN <=16 SENSITIVE Sensitive     TRIMETH/SULFA >=320 RESISTANT Resistant     AMPICILLIN/SULBACTAM 16 INTERMEDIATE Intermediate     PIP/TAZO <=4 SENSITIVE Sensitive  Extended ESBL NEGATIVE Sensitive     * >=100,000 COLONIES/mL ESCHERICHIA COLI    Radiology Reports Dg Chest 2 View  Result Date: 10/31/2016 CLINICAL DATA:  Shortness of breath, weakness. EXAM: CHEST  2 VIEW COMPARISON:   Radiograph of January 23, 2016. FINDINGS: Stable cardiomediastinal silhouette. Atherosclerosis of thoracic aorta is noted. Right internal jugular Port-A-Cath is unchanged in position. No pneumothorax is noted. Stable left basilar scarring is noted. Increased left perihilar opacity is noted concerning for possible pneumonia or edema. Right basilar scarring or atelectasis is noted. Bony thorax is unremarkable. IMPRESSION: Aortic atherosclerosis. Increased left perihilar opacity is noted concerning for pneumonia or edema. Electronically Signed   By: Marijo Conception, M.D.   On: 10/31/2016 19:15     CBC  Recent Labs Lab 10/28/16 1500 10/31/16 1552  WBC 8.7 9.7  HGB 11.5* 12.2  HCT 33.7* 35.4  PLT 195 216  MCV 93.9 93.0  MCH 31.9 32.1  MCHC 34.0 34.5  RDW 16.1* 16.4*  LYMPHSABS 1.0  --   MONOABS 0.3  --   EOSABS 0.0  --   BASOSABS 0.0  --     Chemistries   Recent Labs Lab 10/28/16 1500 10/31/16 1552  NA 128* 129*  K 3.7 4.0  CL 93* 94*  CO2 25 20*  GLUCOSE 172* 236*  BUN 18 20  CREATININE 1.17* 1.32*  CALCIUM 8.8* 9.5  AST 26  --   ALT 22  --   ALKPHOS 72  --   BILITOT 1.1  --    ------------------------------------------------------------------------------------------------------------------ estimated creatinine clearance is 41.6 mL/min (by C-G formula based on SCr of 1.32 mg/dL (H)). ------------------------------------------------------------------------------------------------------------------ No results for input(s): HGBA1C in the last 72 hours. ------------------------------------------------------------------------------------------------------------------ No results for input(s): CHOL, HDL, LDLCALC, TRIG, CHOLHDL, LDLDIRECT in the last 72 hours. ------------------------------------------------------------------------------------------------------------------ No results for input(s): TSH, T4TOTAL, T3FREE, THYROIDAB in the last 72 hours.  Invalid input(s):  FREET3 ------------------------------------------------------------------------------------------------------------------ No results for input(s): VITAMINB12, FOLATE, FERRITIN, TIBC, IRON, RETICCTPCT in the last 72 hours.  Coagulation profile No results for input(s): INR, PROTIME in the last 168 hours.  No results for input(s): DDIMER in the last 72 hours.  Cardiac Enzymes  Recent Labs Lab 10/31/16 1552  TROPONINI <0.03   ------------------------------------------------------------------------------------------------------------------ Invalid input(s): POCBNP    Assessment & Plan  Patient is a 60 year old with history of lung cancer presenting with generalized weakness 1. Community-acquired pneumonia Continue therapy with IV Rocephin and ceftriaxone 2. Acute renal failure due to dehydration. IV fluid support. chceck bmp in am 3. Generalized weakness suspect is multifactorial including severe hypothyroidism, UTI as well as pneumonia 4. TSH greater than 200- I will supplement patient with IV Synthroid for the next 2 days 5. Chronic hyponatremia. Stable. 6. Small cell lung cancer, status post chemotherapy and radiation this August. Follow-up oncologist as outpatient. 7. hyperlidemia continue zocor     Code Status Orders        Start     Ordered   10/31/16 2223  Full code  Continuous     10/31/16 2223    Code Status History    Date Active Date Inactive Code Status Order ID Comments User Context   This patient has a current code status but no historical code status.           Consults none   DVT Prophylaxis  Lovenox    Lab Results  Component Value Date   PLT 216 10/31/2016     Time Spent in minutes   34mn  Greater than 50% of time spent in care coordination and counseling patient regarding the condition and plan of care.   Dustin Flock M.D on 11/01/2016 at 11:50 AM  Between 7am to 6pm - Pager - [phone]  After 6pm go to [email or web address] -  password [email or web address] [place] [facility]   Office  [phone]

## 2016-11-02 LAB — BASIC METABOLIC PANEL
Anion gap: 7 (ref 5–15)
BUN: 10 mg/dL (ref 6–20)
CHLORIDE: 109 mmol/L (ref 101–111)
CO2: 21 mmol/L — AB (ref 22–32)
CREATININE: 1 mg/dL (ref 0.44–1.00)
Calcium: 8.2 mg/dL — ABNORMAL LOW (ref 8.9–10.3)
GFR calc Af Amer: >60 mL/min (ref >60)
GFR calc non Af Amer: 60 mL/min — ABNORMAL LOW (ref >60)
GLUCOSE: 125 mg/dL — AB (ref 65–99)
Potassium: 3.3 mmol/L — ABNORMAL LOW (ref 3.5–5.1)
Sodium: 137 mmol/L (ref 135–145)

## 2016-11-02 LAB — CBC
HEMATOCRIT: 28.4 % — AB (ref 35.0–47.0)
HEMOGLOBIN: 9.8 g/dL — AB (ref 12.0–16.0)
MCH: 32.9 pg (ref 26.0–34.0)
MCHC: 34.6 g/dL (ref 32.0–36.0)
MCV: 95.1 fL (ref 80.0–100.0)
Platelets: 147 10*3/uL — ABNORMAL LOW (ref 150–440)
RBC: 2.98 MIL/uL — ABNORMAL LOW (ref 3.80–5.20)
RDW: 15.6 % — ABNORMAL HIGH (ref 11.5–14.5)
WBC: 4.7 10*3/uL (ref 3.6–11.0)

## 2016-11-02 LAB — HIV ANTIBODY (ROUTINE TESTING W REFLEX): HIV Screen 4th Generation wRfx: NONREACTIVE

## 2016-11-02 MED ORDER — MEGESTROL ACETATE 400 MG/10ML PO SUSP
800.0000 mg | Freq: Every day | ORAL | Status: DC
  Administered 2016-11-02 – 2016-11-05 (×4): 800 mg via ORAL
  Filled 2016-11-02 (×4): qty 20

## 2016-11-02 MED ORDER — ENSURE ENLIVE PO LIQD
237.0000 mL | Freq: Two times a day (BID) | ORAL | Status: DC
  Administered 2016-11-03 – 2016-11-05 (×5): 237 mL via ORAL

## 2016-11-02 MED ORDER — POTASSIUM CHLORIDE CRYS ER 20 MEQ PO TBCR
40.0000 meq | EXTENDED_RELEASE_TABLET | Freq: Once | ORAL | Status: AC
  Administered 2016-11-02: 40 meq via ORAL
  Filled 2016-11-02: qty 2

## 2016-11-02 NOTE — Progress Notes (Signed)
[place] at [facility]                                                                                                                                                                                  Patient Demographics   Jill Gross, is a 60 y.o. female, DOB - March 04, 1956, GYF:[id number]  Admit date - 10/31/2016   Admitting Physician Demetrios Loll, MD  Outpatient Primary MD for the patient is [name], NP   LOS - 2  Subjective: Continues to complain of weakness no chest pain or shortness of breath he had diarrhea yesterday that resolved  Review of Systems:   CONSTITUTIONAL: Positive fever. Positive fatigue, positive weakness. No weight gain, no weight loss.  EYES: No blurry or double vision.  ENT: No tinnitus. No postnasal drip. No redness of the oropharynx.  RESPIRATORY: No cough, no wheeze, no hemoptysis. Positive dyspnea.  CARDIOVASCULAR: No chest pain. No orthopnea. No palpitations. No syncope.  GASTROINTESTINAL: No nausea, no vomiting or diarrhea. No abdominal pain. No melena or hematochezia.  GENITOURINARY: No dysuria or hematuria.  ENDOCRINE: No polyuria or nocturia. No heat or cold intolerance.  HEMATOLOGY: No anemia. No bruising. No bleeding.  INTEGUMENTARY: No rashes. No lesions.  MUSCULOSKELETAL: No arthritis. No swelling. No gout.  NEUROLOGIC: No numbness, tingling, or ataxia. No seizure-type activity.  PSYCHIATRIC: No anxiety. No insomnia. No ADD.    Vitals:   Vitals:   11/01/16 0437 11/01/16 1335 11/01/16 2108 11/02/16 0444  BP: (!) 107/55 111/65 130/67 (!) 119/53  Pulse: 80 79 74 80  Resp: (!) '1 18 20 20  '$ Temp: 98.5 F (36.9 C) 97.5 F (36.4 C) 97.5 F (36.4 C) 97.4 F (36.3 C)  TempSrc:  Oral Oral Oral  SpO2: 92% 95% 96% 90%  Weight:      Height:        Wt Readings from Last 3 Encounters:  10/31/16 155 lb (70.3 kg)  10/28/16 155 lb 6.8 oz (70.5 kg)  09/23/16 153 lb 10.6 oz (69.7 kg)     Intake/Output Summary (Last  24 hours) at 11/02/16 1239 Last data filed at 11/02/16 9163  Gross per 24 hour  Intake          3191.67 ml  Output                0 ml  Net          3191.67 ml    Physical Exam:   GENERAL: Pleasant-appearing Chronically ill-appearing HEAD, EYES, EARS, NOSE AND THROAT: Atraumatic, normocephalic. Extraocular muscles are intact. Pupils equal and reactive to light. Sclerae anicteric. No conjunctival injection. No oro-pharyngeal erythema.  NECK: Supple. There  is no jugular venous distention. No bruits, no lymphadenopathy, no thyromegaly.  HEART: Regular rate and rhythm,. No murmurs, no rubs, no clicks.  LUNGS: Clear to auscultation bilaterally. No rales or rhonchi. No wheezes.  ABDOMEN: Soft, flat, nontender, nondistended. Has good bowel sounds. No hepatosplenomegaly appreciated.  EXTREMITIES: No evidence of any cyanosis, clubbing, or peripheral edema.  +2 pedal and radial pulses bilaterally.  NEUROLOGIC: The patient is alert, awake, and oriented x3 with no focal motor or sensory deficits appreciated bilaterally.  SKIN: Moist and warm with no rashes appreciated.  Psych: Not anxious, depressed LN: No inguinal LN enlargement    Antibiotics   Anti-infectives    Start     Dose/Rate Route Frequency Ordered Stop   11/01/16 2200  azithromycin (ZITHROMAX) 500 mg in dextrose 5 % 250 mL IVPB  Status:  Discontinued     500 mg 250 mL/hr over 60 Minutes Intravenous Daily 10/31/16 2223 11/01/16 1021   11/01/16 2100  azithromycin (ZITHROMAX) tablet 500 mg     500 mg Oral Every 24 hours 11/01/16 1021 11/08/16 2059   11/01/16 0000  cefTRIAXone (ROCEPHIN) IVPB 1 g     1 g 100 mL/hr over 30 Minutes Intravenous Daily 10/31/16 2226 11/07/16 2159   10/31/16 2230  cefTRIAXone (ROCEPHIN) 1 g in dextrose 5 % 50 mL IVPB  Status:  Discontinued     1 g 100 mL/hr over 30 Minutes Intravenous Every 24 hours 10/31/16 2223 10/31/16 2226   10/31/16 2000  levofloxacin (LEVAQUIN) IVPB 750 mg     750 mg 100 mL/hr over  90 Minutes Intravenous  Once 10/31/16 1952 10/31/16 2125      Medications   Scheduled Meds: . amitriptyline  20 mg Oral QHS  . azithromycin  500 mg Oral Q24H  . cefTRIAXone  1 g Intravenous Daily  . dexamethasone  4 mg Oral Q breakfast  . enoxaparin (LOVENOX) injection  40 mg Subcutaneous QHS  . feeding supplement (ENSURE ENLIVE)  237 mL Oral BID BM  . pantoprazole  40 mg Oral QAC breakfast  . potassium chloride  40 mEq Oral Once  . simvastatin  10 mg Oral q1800  . zolpidem  10 mg Oral QHS   Continuous Infusions: . sodium chloride 100 mL/hr at 11/02/16 0645   PRN Meds:.acetaminophen, albuterol, guaiFENesin, ipratropium-albuterol, LORazepam, ondansetron (ZOFRAN) IV, prochlorperazine, promethazine   Data Review:   Micro Results Recent Results (from the past 240 hour(s))  Urine culture     Status: Abnormal   Collection Time: 10/28/16  3:35 PM  Result Value Ref Range Status   Specimen Description URINE, CLEAN CATCH  Final   Special Requests NONE  Final   Culture >=100,000 COLONIES/mL ESCHERICHIA COLI (A)  Final   Report Status 10/31/2016 FINAL  Final   Organism ID, Bacteria ESCHERICHIA COLI (A)  Final      Susceptibility   Escherichia coli - MIC*    AMPICILLIN >=32 RESISTANT Resistant     CEFAZOLIN <=4 SENSITIVE Sensitive     CEFTRIAXONE <=1 SENSITIVE Sensitive     CIPROFLOXACIN 0.5 SENSITIVE Sensitive     GENTAMICIN <=1 SENSITIVE Sensitive     IMIPENEM <=0.25 SENSITIVE Sensitive     NITROFURANTOIN <=16 SENSITIVE Sensitive     TRIMETH/SULFA >=320 RESISTANT Resistant     AMPICILLIN/SULBACTAM 16 INTERMEDIATE Intermediate     PIP/TAZO <=4 SENSITIVE Sensitive     Extended ESBL NEGATIVE Sensitive     * >=100,000 COLONIES/mL ESCHERICHIA COLI  Blood culture (routine x 2)  Status: None (Preliminary result)   Collection Time: 10/31/16  8:12 PM  Result Value Ref Range Status   Specimen Description BLOOD LEFT ASSIST CONTROL  Final   Special Requests   Final    BOTTLES DRAWN  AEROBIC AND ANAEROBIC 13CCAERO,7CCANA   Culture NO GROWTH 2 DAYS  Final   Report Status PENDING  Incomplete  Blood culture (routine x 2)     Status: None (Preliminary result)   Collection Time: 10/31/16  8:22 PM  Result Value Ref Range Status   Specimen Description BLOOD RIGHT ASSIST CONTROL  Final   Special Requests   Final    BOTTLES DRAWN AEROBIC AND ANAEROBIC 12CCAERO,15CCANA   Culture NO GROWTH 2 DAYS  Final   Report Status PENDING  Incomplete  Urine culture     Status: Abnormal (Preliminary result)   Collection Time: 10/31/16 11:03 PM  Result Value Ref Range Status   Specimen Description URINE, RANDOM  Final   Special Requests NONE  Final   Culture 50,000 COLONIES/mL GRAM NEGATIVE RODS (A)  Final   Report Status PENDING  Incomplete    Radiology Reports Dg Chest 2 View  Result Date: 10/31/2016 CLINICAL DATA:  Shortness of breath, weakness. EXAM: CHEST  2 VIEW COMPARISON:  Radiograph of January 23, 2016. FINDINGS: Stable cardiomediastinal silhouette. Atherosclerosis of thoracic aorta is noted. Right internal jugular Port-A-Cath is unchanged in position. No pneumothorax is noted. Stable left basilar scarring is noted. Increased left perihilar opacity is noted concerning for possible pneumonia or edema. Right basilar scarring or atelectasis is noted. Bony thorax is unremarkable. IMPRESSION: Aortic atherosclerosis. Increased left perihilar opacity is noted concerning for pneumonia or edema. Electronically Signed   By: Marijo Conception, M.D.   On: 10/31/2016 19:15     CBC  Recent Labs Lab 10/28/16 1500 10/31/16 1552 11/02/16 0544  WBC 8.7 9.7 4.7  HGB 11.5* 12.2 9.8*  HCT 33.7* 35.4 28.4*  PLT 195 216 147*  MCV 93.9 93.0 95.1  MCH 31.9 32.1 32.9  MCHC 34.0 34.5 34.6  RDW 16.1* 16.4* 15.6*  LYMPHSABS 1.0  --   --   MONOABS 0.3  --   --   EOSABS 0.0  --   --   BASOSABS 0.0  --   --     Chemistries   Recent Labs Lab 10/28/16 1500 10/31/16 1552 11/02/16 0544  NA  128* 129* 137  K 3.7 4.0 3.3*  CL 93* 94* 109  CO2 25 20* 21*  GLUCOSE 172* 236* 125*  BUN '18 20 10  '$ CREATININE 1.17* 1.32* 1.00  CALCIUM 8.8* 9.5 8.2*  AST 26  --   --   ALT 22  --   --   ALKPHOS 72  --   --   BILITOT 1.1  --   --    ------------------------------------------------------------------------------------------------------------------ estimated creatinine clearance is 55 mL/min (by C-G formula based on SCr of 1 mg/dL). ------------------------------------------------------------------------------------------------------------------ No results for input(s): HGBA1C in the last 72 hours. ------------------------------------------------------------------------------------------------------------------ No results for input(s): CHOL, HDL, LDLCALC, TRIG, CHOLHDL, LDLDIRECT in the last 72 hours. ------------------------------------------------------------------------------------------------------------------ No results for input(s): TSH, T4TOTAL, T3FREE, THYROIDAB in the last 72 hours.  Invalid input(s): FREET3 ------------------------------------------------------------------------------------------------------------------  Recent Labs  11/01/16 0918  VITAMINB12 301    Coagulation profile No results for input(s): INR, PROTIME in the last 168 hours.  No results for input(s): DDIMER in the last 72 hours.  Cardiac Enzymes  Recent Labs Lab 10/31/16 1552  TROPONINI <0.03   ------------------------------------------------------------------------------------------------------------------ Invalid input(s):  POCBNP    Assessment & Plan  Patient is a 60 year old with history of lung cancer presenting with generalized weakness 1. Community-acquired pneumonia Continue therapy with IV Rocephin  2. Acute renal failure due to dehydration. Resolved with IV fluids 3. Generalized weakness suspect is multifactorial including severe hypothyroidism, UTI as well as pneumonia, PT  evaluation start patient on appetite stimulant 4. TSH greater than 200- I will supplement patient with IV Synthroid for the next 2 days and resume oral Synthroid tomorrow 5. Chronic hyponatremia. Stable. 6. Small cell lung cancer, status post chemotherapy and radiation this August. Follow-up oncologist as outpatient. 7. hyperlidemia continue zocor     Code Status Orders        Start     Ordered   10/31/16 2223  Full code  Continuous     10/31/16 2223    Code Status History    Date Active Date Inactive Code Status Order ID Comments User Context   This patient has a current code status but no historical code status.           Consults none   DVT Prophylaxis  Lovenox    Lab Results  Component Value Date   PLT 147 (L) 11/02/2016     Time Spent in minutes   36mn  Greater than 50% of time spent in care coordination and counseling patient regarding the condition and plan of care.   PDustin FlockM.D on 11/02/2016 at 12:39 PM  Between 7am to 6pm - Pager - [phone]  After 6pm go to [email or web address] - password [email or web address]   Office  [phone]

## 2016-11-03 ENCOUNTER — Other Ambulatory Visit: Payer: Self-pay | Admitting: Oncology

## 2016-11-03 LAB — BASIC METABOLIC PANEL
Anion gap: 9 (ref 5–15)
BUN: 8 mg/dL (ref 6–20)
CALCIUM: 8.8 mg/dL — AB (ref 8.9–10.3)
CO2: 25 mmol/L (ref 22–32)
CREATININE: 0.99 mg/dL (ref 0.44–1.00)
Chloride: 102 mmol/L (ref 101–111)
GFR calc non Af Amer: >60 mL/min (ref >60)
Glucose, Bld: 161 mg/dL — ABNORMAL HIGH (ref 65–99)
Potassium: 3.4 mmol/L — ABNORMAL LOW (ref 3.5–5.1)
SODIUM: 136 mmol/L (ref 135–145)

## 2016-11-03 LAB — CBC
HCT: 29.2 % — ABNORMAL LOW (ref 35.0–47.0)
HEMOGLOBIN: 10 g/dL — AB (ref 12.0–16.0)
MCH: 32.2 pg (ref 26.0–34.0)
MCHC: 34.2 g/dL (ref 32.0–36.0)
MCV: 94.4 fL (ref 80.0–100.0)
Platelets: 144 10*3/uL — ABNORMAL LOW (ref 150–440)
RBC: 3.1 MIL/uL — ABNORMAL LOW (ref 3.80–5.20)
RDW: 15.9 % — AB (ref 11.5–14.5)
WBC: 4.3 10*3/uL (ref 3.6–11.0)

## 2016-11-03 LAB — URINE CULTURE

## 2016-11-03 MED ORDER — POTASSIUM CHLORIDE 20 MEQ PO PACK
20.0000 meq | PACK | Freq: Once | ORAL | Status: AC
  Administered 2016-11-03: 14:00:00 20 meq via ORAL
  Filled 2016-11-03: qty 1

## 2016-11-03 NOTE — Evaluation (Signed)
Physical Therapy Evaluation Patient Details Name: Jill Gross MRN: [id number] DOB: 1956/06/22 Today's Date: 11/03/2016   History of Present Illness  Pt is a 60 y.o. female presenting to hospital with generalized weakness.  Pt admitted to hospital with L perihilar PNA, acute renal failure d/t dehydration, and UTI.  PMH includes SCLC of bronchus L LL (prior to hospitalization imaging showing near complete collapse L LL secondary to mucous plugging and radiation changes), s/p chemo and radiation, s/p brain radiation, COPD, LBP, neuropathy, CTR, ankle surgery x4, hernia repair, anemia, arthritis.  Clinical Impression  Prior to hospital admission, pt was independent.  Pt lives with her husband in 1 level home with stairs to enter.  Currently pt is min assist supine to sit, CGA with transfers, and CGA with ambulation 40 feet with RW.  Limited distance ambulating d/t overall fatigue.  Pt's O2 decreased from 94% at rest to 84% with ambulation but returned to 92% within a few minutes of purse lip breathing and sitting rest (all on 2 L/min supplemental O2 via nasal cannula).  Pt would benefit from skilled PT to address noted impairments and functional limitations.  Recommend pt discharge to home with SBA for functional mobility for safety (with use of RW) when medically appropriate.    Follow Up Recommendations Home health PT;Supervision for mobility/OOB    Equipment Recommendations  Rolling walker with 5" wheels;3in1 (PT)    Recommendations for Other Services       Precautions / Restrictions Precautions Precautions: Fall Precaution Comments: R chest port; HOB >30 degrees Restrictions Weight Bearing Restrictions: No      Mobility  Bed Mobility Overal bed mobility: Needs Assistance Bed Mobility: Supine to Sit     Supine to sit: Min assist;HOB elevated     General bed mobility comments: assist for trunk; increased effort to perform by pt  Transfers Overall transfer level: Needs  assistance Equipment used: Rolling walker (2 wheeled) Transfers: Sit to/from Stand Sit to Stand: Min guard         General transfer comment: steady stand without any loss of balance  Ambulation/Gait Ambulation/Gait assistance: Min guard Ambulation Distance (Feet): 40 Feet Assistive device: Rolling walker (2 wheeled)   Gait velocity: decreased   General Gait Details: decreased B step length/foot clearance/heelstrike; limited distance d/t fatigue; initial vc's for RW use required  Stairs            Wheelchair Mobility    Modified Rankin (Stroke Patients Only)       Balance Overall balance assessment: Needs assistance;History of Falls Sitting-balance support: Bilateral upper extremity supported;Feet supported Sitting balance-Leahy Scale: Good     Standing balance support: Bilateral upper extremity supported (on RW) Standing balance-Leahy Scale: Fair                               Pertinent Vitals/Pain Pain Assessment: No/denies pain    Home Living Family/patient expects to be discharged to:: Private residence Living Arrangements: Spouse/significant other Available Help at Discharge: Family Type of Home: House Home Access: Stairs to enter Entrance Stairs-Rails: None Entrance Stairs-Number of Steps: 5-6 Home Layout: One level Home Equipment: None      Prior Function Level of Independence: Independent         Comments: Pt reports 1 recent fall going to bathroom at night.  Pt's husband and daughter able to provide 24/7 assist per pt.     Hand Dominance  Extremity/Trunk Assessment   Upper Extremity Assessment: Generalized weakness           Lower Extremity Assessment: Generalized weakness;RLE deficits/detail;LLE deficits/detail         Communication   Communication: No difficulties  Cognition Arousal/Alertness: Awake/alert Behavior During Therapy: WFL for tasks assessed/performed Overall Cognitive Status: Within  Functional Limits for tasks assessed                      General Comments General comments (skin integrity, edema, etc.): Pt's husband present entire session.  Nursing cleared pt for participation in physical therapy.  Pt agreeable to PT session.    Exercises Total Joint Exercises Ankle Circles/Pumps: AROM;Strengthening;Both;10 reps;Supine Quad Sets: AROM;Strengthening;Both;10 reps;Supine Short Arc Quad: AROM;Strengthening;Both;10 reps;Supine Heel Slides: Strengthening;Both;10 reps;Supine (AROM R; AAROM L) Hip ABduction/ADduction: AAROM;Strengthening;Both;10 reps;Supine  Pt required vc's for technique of ex's.   Assessment/Plan    PT Assessment Patient needs continued PT services  PT Problem List Decreased strength;Decreased activity tolerance;Decreased balance;Decreased mobility;Decreased knowledge of use of DME;Cardiopulmonary status limiting activity          PT Treatment Interventions DME instruction;Gait training;Stair training;Functional mobility training;Therapeutic activities;Therapeutic exercise;Balance training;Patient/family education    PT Goals (Current goals can be found in the Care Plan section)  Acute Rehab PT Goals Patient Stated Goal: to go home PT Goal Formulation: With patient Time For Goal Achievement: 11/17/16 Potential to Achieve Goals: Good    Frequency Min 2X/week   Barriers to discharge        Co-evaluation               End of Session Equipment Utilized During Treatment: Gait belt;Oxygen (2 L/min O2 via nasal cannula) Activity Tolerance: Patient limited by fatigue Patient left: in chair;with call bell/phone within reach;with chair alarm set;with family/visitor present Nurse Communication: Mobility status;Precautions (O2 sats during session)         Time: 1005-1030 PT Time Calculation (min) (ACUTE ONLY): 25 min   Charges:   PT Evaluation $PT Eval Low Complexity: 1 Procedure PT Treatments $Therapeutic Exercise: 8-22  mins   PT G CodesLeitha Bleak Nov 15, 2016, 10:58 AM Leitha Bleak, [place]

## 2016-11-04 LAB — BASIC METABOLIC PANEL
ANION GAP: 9 (ref 5–15)
BUN: 11 mg/dL (ref 6–20)
CALCIUM: 9.2 mg/dL (ref 8.9–10.3)
CO2: 25 mmol/L (ref 22–32)
Chloride: 100 mmol/L — ABNORMAL LOW (ref 101–111)
Creatinine, Ser: 0.87 mg/dL (ref 0.44–1.00)
Glucose, Bld: 180 mg/dL — ABNORMAL HIGH (ref 65–99)
POTASSIUM: 3.7 mmol/L (ref 3.5–5.1)
SODIUM: 134 mmol/L — AB (ref 135–145)

## 2016-11-04 LAB — MAGNESIUM: MAGNESIUM: 1.9 mg/dL (ref 1.7–2.4)

## 2016-11-04 MED ORDER — LEVOTHYROXINE SODIUM 200 MCG PO TABS
200.0000 ug | ORAL_TABLET | Freq: Every day | ORAL | Status: DC
  Administered 2016-11-04 – 2016-11-05 (×2): 200 ug via ORAL
  Filled 2016-11-04 (×2): qty 1

## 2016-11-04 NOTE — Progress Notes (Signed)
Physical Therapy Treatment Patient Details Name: Jill Gross MRN: [id number] DOB: [birth year] Today's Date: 11/04/2016    History of Present Illness Pt is a 60 y.o. female presenting to hospital with generalized weakness.  Pt admitted to hospital with L perihilar PNA, acute renal failure d/t dehydration, and UTI.  PMH includes SCLC of bronchus L LL (prior to hospitalization imaging showing near complete collapse L LL secondary to mucous plugging and radiation changes), s/p chemo and radiation, s/p brain radiation, COPD, LBP, neuropathy, CTR, ankle surgery x4, hernia repair, anemia, arthritis.    PT Comments    Pt unable to progress ambulation distance today d/t weakness/fatigue (pt only able to walk 40 feet with slow cadence and use of RW).  Pt demonstrating decreased activity tolerance during session requiring rest breaks and increased time to perform activities.  Trialed pt on room air (per discussion with nursing d/t pt not on O2 at home baseline) and O2 saturations 86%; pt placed on 2 L/min O2 via nasal cannula for rest of session and O2 desaturated to 85% with ambulation and was 92% end of session sitting in chair.  Pt's progress complicated d/t weakness, decreased activity tolerance, and pulmonary impairments.  D/t noted concerns, recommend pt discharge to STR (CM and SW notified).   Follow Up Recommendations  SNF     Equipment Recommendations  Rolling walker with 5" wheels;3in1 (PT)    Recommendations for Other Services       Precautions / Restrictions Precautions Precautions: Fall Precaution Comments: R chest port; HOB >30 degrees Restrictions Weight Bearing Restrictions: No    Mobility  Bed Mobility Overal bed mobility: Needs Assistance Bed Mobility: Supine to Sit     Supine to sit: Supervision     General bed mobility comments: bed flat; increased effort and time to perform on own  Transfers Overall transfer level: Needs assistance Equipment used: Rolling walker  (2 wheeled) Transfers: Sit to/from Stand Sit to Stand: Min guard         General transfer comment: increased effort to stand noted but steady without loss of balance using RW  Ambulation/Gait Ambulation/Gait assistance: Min guard Ambulation Distance (Feet): 40 Feet Assistive device: Rolling walker (2 wheeled)   Gait velocity: decreased   General Gait Details: decreased B step length/foot clearance/heelstrike; limited distance d/t fatigue/weakness   Stairs            Wheelchair Mobility    Modified Rankin (Stroke Patients Only)       Balance Overall balance assessment: Needs assistance;History of Falls Sitting-balance support: Bilateral upper extremity supported;Feet supported Sitting balance-Leahy Scale: Good     Standing balance support: Bilateral upper extremity supported (on RW) Standing balance-Leahy Scale: Good                      Cognition Arousal/Alertness: Awake/alert Behavior During Therapy: WFL for tasks assessed/performed Overall Cognitive Status: Within Functional Limits for tasks assessed                      Exercises      General Comments General comments (skin integrity, edema, etc.): Pt's husband present entire session.  Nursing cleared pt for participation in physical therapy.  Pt agreeable to PT session.      Pertinent Vitals/Pain Pain Assessment: No/denies pain    Home Living                      Prior Function  PT Goals (current goals can now be found in the care plan section) Acute Rehab PT Goals Patient Stated Goal: to go home PT Goal Formulation: With patient Time For Goal Achievement: 11/17/16 Potential to Achieve Goals: Fair Progress towards PT goals: Not progressing toward goals - comment (Unable to increase ambulation distance today d/t weakness/fatigue)    Frequency    Min 2X/week      PT Plan Discharge plan needs to be updated    Co-evaluation             End of  Session Equipment Utilized During Treatment: Gait belt;Oxygen (2 L/min O2 via nasal cannula) Activity Tolerance: Patient limited by fatigue Patient left: in chair;with call bell/phone within reach;with chair alarm set;with family/visitor present     Time: 6599-3570 PT Time Calculation (min) (ACUTE ONLY): 23 min  Charges:  $Therapeutic Activity: 23-37 mins                    G CodesLeitha Bleak 11/09/2016, 10:50 AM Leitha Bleak, [place]

## 2016-11-04 NOTE — Progress Notes (Addendum)
[place] at [facility]                                                                                                                                                                                  Patient Demographics   Jill Gross, is a 60 y.o. female, DOB - 08-Oct-1956, JJH:[id number]  Admit date - 10/31/2016   Admitting Physician Demetrios Loll, MD  Outpatient Primary MD for the patient is [name], NP   LOS - 4  Subjective:Patient has been feeling better than yesterday. Potassium is low, so we will replace the potassium. Denies any chest pain, cough. Patient has still poor by mouth intake.   Review of Systems:   CONSTITUTIONAL: . Positive fatigue, positive weakness. No weight gain, no weight loss.  EYES: No blurry or double vision.  ENT: No tinnitus. No postnasal drip. No redness of the oropharynx.  RESPIRATORY: No cough, no wheeze, no hemoptysis. Positive dyspnea.  CARDIOVASCULAR: No chest pain. No orthopnea. No palpitations. No syncope.  GASTROINTESTINAL: No nausea, no vomiting or diarrhea. No abdominal pain. No melena or hematochezia.  GENITOURINARY: No dysuria or hematuria.  ENDOCRINE: No polyuria or nocturia. No heat or cold intolerance.  HEMATOLOGY: No anemia. No bruising. No bleeding.  INTEGUMENTARY: No rashes. No lesions.  MUSCULOSKELETAL: No arthritis. No swelling. No gout.  NEUROLOGIC: No numbness, tingling, or ataxia. No seizure-type activity.  PSYCHIATRIC: No anxiety. No insomnia. No ADD.    Vitals:   Vitals:   11/03/16 1020 11/03/16 1218 11/03/16 2112 11/04/16 0442  BP:  (!) 116/59 133/66 122/66  Pulse:  80 72 72  Resp:  '18 18 18  '$ Temp:  98.5 F (36.9 C) 97.5 F (36.4 C) 97.7 F (36.5 C)  TempSrc:  Oral Oral Oral  SpO2: (!) 84% 96% 97% 99%  Weight:      Height:        Wt Readings from Last 3 Encounters:  10/31/16 70.3 kg (155 lb)  10/28/16 70.5 kg (155 lb 6.8 oz)  09/23/16 69.7 kg (153 lb 10.6 oz)     Intake/Output  Summary (Last 24 hours) at 11/04/16 0834 Last data filed at 11/03/16 1847  Gross per 24 hour  Intake              480 ml  Output                0 ml  Net              480 ml    Physical Exam:   GENERAL: Pleasant-appearing Chronically ill-appearing HEAD, EYES, EARS, NOSE AND THROAT: Atraumatic, normocephalic. Extraocular muscles are intact. Pupils equal and reactive to  light. Sclerae anicteric. No conjunctival injection. No oro-pharyngeal erythema.  NECK: Supple. There is no jugular venous distention. No bruits, no lymphadenopathy, no thyromegaly.  HEART: Regular rate and rhythm,. No murmurs, no rubs, no clicks.  LUNGS: Clear to auscultation bilaterally. No rales or rhonchi. No wheezes.  ABDOMEN: Soft, flat, nontender, nondistended. Has good bowel sounds. No hepatosplenomegaly appreciated.  EXTREMITIES: No evidence of any cyanosis, clubbing, or peripheral edema.  +2 pedal and radial pulses bilaterally.  NEUROLOGIC: The patient is alert, awake, and oriented x3 with no focal motor or sensory deficits appreciated bilaterally.  SKIN: Moist and warm with no rashes appreciated.  Psych: Not anxious, depressed LN: No inguinal LN enlargement    Antibiotics   Anti-infectives    Start     Dose/Rate Route Frequency Ordered Stop   11/01/16 2200  azithromycin (ZITHROMAX) 500 mg in dextrose 5 % 250 mL IVPB  Status:  Discontinued     500 mg 250 mL/hr over 60 Minutes Intravenous Daily 10/31/16 2223 11/01/16 1021   11/01/16 2100  azithromycin (ZITHROMAX) tablet 500 mg     500 mg Oral Every 24 hours 11/01/16 1021 11/08/16 2059   11/01/16 0000  cefTRIAXone (ROCEPHIN) IVPB 1 g     1 g 100 mL/hr over 30 Minutes Intravenous Daily 10/31/16 2226 11/07/16 2159   10/31/16 2230  cefTRIAXone (ROCEPHIN) 1 g in dextrose 5 % 50 mL IVPB  Status:  Discontinued     1 g 100 mL/hr over 30 Minutes Intravenous Every 24 hours 10/31/16 2223 10/31/16 2226   10/31/16 2000  levofloxacin (LEVAQUIN) IVPB 750 mg     750  mg 100 mL/hr over 90 Minutes Intravenous  Once 10/31/16 1952 10/31/16 2125      Medications   Scheduled Meds: . amitriptyline  20 mg Oral QHS  . azithromycin  500 mg Oral Q24H  . cefTRIAXone  1 g Intravenous Daily  . dexamethasone  4 mg Oral Q breakfast  . enoxaparin (LOVENOX) injection  40 mg Subcutaneous QHS  . feeding supplement (ENSURE ENLIVE)  237 mL Oral BID BM  . levothyroxine  200 mcg Oral QAC breakfast  . megestrol  800 mg Oral Daily  . pantoprazole  40 mg Oral QAC breakfast  . simvastatin  10 mg Oral q1800  . zolpidem  10 mg Oral QHS   Continuous Infusions:  PRN Meds:.acetaminophen, guaiFENesin, ipratropium-albuterol, LORazepam, ondansetron (ZOFRAN) IV, prochlorperazine, promethazine   Data Review:   Micro Results Recent Results (from the past 240 hour(s))  Urine culture     Status: Abnormal   Collection Time: 10/28/16  3:35 PM  Result Value Ref Range Status   Specimen Description URINE, CLEAN CATCH  Final   Special Requests NONE  Final   Culture >=100,000 COLONIES/mL ESCHERICHIA COLI (A)  Final   Report Status 10/31/2016 FINAL  Final   Organism ID, Bacteria ESCHERICHIA COLI (A)  Final      Susceptibility   Escherichia coli - MIC*    AMPICILLIN >=32 RESISTANT Resistant     CEFAZOLIN <=4 SENSITIVE Sensitive     CEFTRIAXONE <=1 SENSITIVE Sensitive     CIPROFLOXACIN 0.5 SENSITIVE Sensitive     GENTAMICIN <=1 SENSITIVE Sensitive     IMIPENEM <=0.25 SENSITIVE Sensitive     NITROFURANTOIN <=16 SENSITIVE Sensitive     TRIMETH/SULFA >=320 RESISTANT Resistant     AMPICILLIN/SULBACTAM 16 INTERMEDIATE Intermediate     PIP/TAZO <=4 SENSITIVE Sensitive     Extended ESBL NEGATIVE Sensitive     * >=  100,000 COLONIES/mL ESCHERICHIA COLI  Blood culture (routine x 2)     Status: None (Preliminary result)   Collection Time: 10/31/16  8:12 PM  Result Value Ref Range Status   Specimen Description BLOOD LEFT ASSIST CONTROL  Final   Special Requests   Final    BOTTLES DRAWN  AEROBIC AND ANAEROBIC 13CCAERO,7CCANA   Culture NO GROWTH 4 DAYS  Final   Report Status PENDING  Incomplete  Blood culture (routine x 2)     Status: None (Preliminary result)   Collection Time: 10/31/16  8:22 PM  Result Value Ref Range Status   Specimen Description BLOOD RIGHT ASSIST CONTROL  Final   Special Requests   Final    BOTTLES DRAWN AEROBIC AND ANAEROBIC 12CCAERO,15CCANA   Culture NO GROWTH 4 DAYS  Final   Report Status PENDING  Incomplete  Urine culture     Status: Abnormal   Collection Time: 10/31/16 11:03 PM  Result Value Ref Range Status   Specimen Description URINE, RANDOM  Final   Special Requests NONE  Final   Culture 50,000 COLONIES/mL ESCHERICHIA COLI (A)  Final   Report Status 11/03/2016 FINAL  Final   Organism ID, Bacteria ESCHERICHIA COLI (A)  Final      Susceptibility   Escherichia coli - MIC*    AMPICILLIN >=32 RESISTANT Resistant     CEFAZOLIN <=4 SENSITIVE Sensitive     CEFTRIAXONE <=1 SENSITIVE Sensitive     CIPROFLOXACIN <=0.25 SENSITIVE Sensitive     GENTAMICIN <=1 SENSITIVE Sensitive     IMIPENEM <=0.25 SENSITIVE Sensitive     NITROFURANTOIN <=16 SENSITIVE Sensitive     TRIMETH/SULFA >=320 RESISTANT Resistant     AMPICILLIN/SULBACTAM 16 INTERMEDIATE Intermediate     PIP/TAZO <=4 SENSITIVE Sensitive     Extended ESBL NEGATIVE Sensitive     * 50,000 COLONIES/mL ESCHERICHIA COLI    Radiology Reports Dg Chest 2 View  Result Date: 10/31/2016 CLINICAL DATA:  Shortness of breath, weakness. EXAM: CHEST  2 VIEW COMPARISON:  Radiograph of January 23, 2016. FINDINGS: Stable cardiomediastinal silhouette. Atherosclerosis of thoracic aorta is noted. Right internal jugular Port-A-Cath is unchanged in position. No pneumothorax is noted. Stable left basilar scarring is noted. Increased left perihilar opacity is noted concerning for possible pneumonia or edema. Right basilar scarring or atelectasis is noted. Bony thorax is unremarkable. IMPRESSION: Aortic  atherosclerosis. Increased left perihilar opacity is noted concerning for pneumonia or edema. Electronically Signed   By: Marijo Conception, M.D.   On: 10/31/2016 19:15     CBC  Recent Labs Lab 10/28/16 1500 10/31/16 1552 11/02/16 0544 11/03/16 0818  WBC 8.7 9.7 4.7 4.3  HGB 11.5* 12.2 9.8* 10.0*  HCT 33.7* 35.4 28.4* 29.2*  PLT 195 216 147* 144*  MCV 93.9 93.0 95.1 94.4  MCH 31.9 32.1 32.9 32.2  MCHC 34.0 34.5 34.6 34.2  RDW 16.1* 16.4* 15.6* 15.9*  LYMPHSABS 1.0  --   --   --   MONOABS 0.3  --   --   --   EOSABS 0.0  --   --   --   BASOSABS 0.0  --   --   --     Chemistries   Recent Labs Lab 10/28/16 1500 10/31/16 1552 11/02/16 0544 11/03/16 0818 11/04/16 0346  NA 128* 129* 137 136 134*  K 3.7 4.0 3.3* 3.4* 3.7  CL 93* 94* 109 102 100*  CO2 25 20* 21* 25 25  GLUCOSE 172* 236* 125* 161* 180*  BUN  $'18 20 10 8 11  'e$ CREATININE 1.17* 1.32* 1.00 0.99 0.87  CALCIUM 8.8* 9.5 8.2* 8.8* 9.2  MG  --   --   --   --  1.9  AST 26  --   --   --   --   ALT 22  --   --   --   --   ALKPHOS 72  --   --   --   --   BILITOT 1.1  --   --   --   --    ------------------------------------------------------------------------------------------------------------------ estimated creatinine clearance is 63.2 mL/min (by C-G formula based on SCr of 0.87 mg/dL). ------------------------------------------------------------------------------------------------------------------ No results for input(s): HGBA1C in the last 72 hours. ------------------------------------------------------------------------------------------------------------------ No results for input(s): CHOL, HDL, LDLCALC, TRIG, CHOLHDL, LDLDIRECT in the last 72 hours. ------------------------------------------------------------------------------------------------------------------ No results for input(s): TSH, T4TOTAL, T3FREE, THYROIDAB in the last 72 hours.  Invalid input(s):  FREET3 ------------------------------------------------------------------------------------------------------------------  Recent Labs  11/01/16 0918  VITAMINB12 301    Coagulation profile No results for input(s): INR, PROTIME in the last 168 hours.  No results for input(s): DDIMER in the last 72 hours.  Cardiac Enzymes  Recent Labs Lab 10/31/16 1552  TROPONINI <0.03   ------------------------------------------------------------------------------------------------------------------ Invalid input(s): POCBNP    Assessment & Plan  Patient is a 60 year old with history of lung cancer presenting with generalized weakness 1. Community-acquired pneumonia Continue therapy with IV Rocephin  2. Acute renal failure due to dehydration. Resolved with IV fluids 3. Generalized weakness suspect is multifactorial including severe hypothyroidism, UTI as well as pneumonia, PT evaluation start edpatient on appetite stimulant 4. TSH greater than 200- patient received IV synthyroid. 5. Chronic hyponatremia. Stable. 6. Small cell lung cancer, status post chemotherapy and radiation this August. Follow-up oncologist as outpatient. 7. hyperlidemia continue zocor     Code Status Orders        Start     Ordered   10/31/16 2223  Full code  Continuous     10/31/16 2223    Code Status History    Date Active Date Inactive Code Status Order ID Comments User Context   This patient has a current code status but no historical code status.           Consults none   DVT Prophylaxis  Lovenox    Lab Results  Component Value Date   PLT 144 (L) 11/03/2016     Time Spent in minutes   52mn  Greater than 50% of time spent in care coordination and counseling patient regarding the condition and plan of care.   KEpifanio LeschesM.D on 11/03/2016 at 8:34 AM  Between 7am to 6pm - Pager - [phone]  After 6pm go to [email or web address] - password [email or web address]    Office  [phone]

## 2016-11-04 NOTE — Clinical Social Work Note (Signed)
Clinical Social Work Assessment  Patient Details  Name: Jill Gross MRN: [id number] Date of Birth: 08/01/1956  Date of referral:  11/04/16               Reason for consult:  Facility Placement, Discharge Planning                Permission sought to share information with:  Case Manager, Facility Contact Representative, Family Supports Permission granted to share information::  Yes, Verbal Permission Granted  Name::        Agency::     Relationship::  Husband at the bedside  Contact Information:     Housing/Transportation Living arrangements for the past 2 months:  Single Family Home Source of Information:  Patient, Medical Team, Case Manager, Spouse Patient Interpreter Needed:  None Criminal Activity/Legal Involvement Pertinent to Current Situation/Hospitalization:  No - Comment as needed Significant Relationships:  Other Family Members, Spouse Lives with:  Spouse Do you feel safe going back to the place where you live?  Yes Need for family participation in patient care:  Yes (Comment)  Care giving concerns:  Husband at bedside with patient reports he will be available for patient at home and able to work from home and take some FMLA in effort to assist patient. Open to home health at this time as he feels and patient verbalizes she would feel more comfortable.  Patient lives in a one story home and able to ambulate, but just very weak from completing her cancer treatments and PNA. Patient does not work, lives at home.   Social Worker assessment / plan:  LCSW completed assessment and met with patient and husband per consult for possible for SNF.  Patient reports she lives at home, completed cancer treatment and doing well other than this most recent PNA dx.  LCSW spent long amount of time discussion options of SNF vs Home and answered questions regarding insurance coverage and information about disposition. Patient husband opted to do home with home health due to wanting patient in  natural environment and feel she will improve faster. LCSW discussed with CM disposition and plan. LCSW spent time with husband and patient discussing insurance: medicare and different managing medicare companies per their questions and information.    Employment status:  Disabled (Comment on whether or not currently receiving Disability) Insurance information:  Managed Care PT Recommendations:  Skilled Nursing Facility, 24 Hour Supervision Information / Referral to community resources:  Skilled Nursing Facility  Patient/Family's Response to care:  Agreeable to plan to home, deferring SNF.  Patient/Family's Understanding of and Emotional Response to Diagnosis, Current Treatment, and Prognosis:  Husband and patient agreeable to plan home and discharge aware of her current treatment plan and prognosis.  Emotional Assessment Appearance:  Appears older than stated age Attitude/Demeanor/Rapport:  Lethargic, Other (exhausted but pleasant and engaged) Affect (typically observed):  Accepting, Adaptable, Pleasant Orientation:  Oriented to Self, Oriented to Place, Oriented to  Time, Oriented to Situation Alcohol / Substance use:  Not Applicable Psych involvement (Current and /or in the community):  No (Comment)  Discharge Needs  Concerns to be addressed:  No discharge needs identified Readmission within the last 30 days:  No Current discharge risk:  None Barriers to Discharge:  No Barriers Identified, Continued Medical Work up   Coble, Hannah N, LCSW 11/04/2016, 11:20 AM  

## 2016-11-04 NOTE — Progress Notes (Signed)
[place] [facility]                                                                                                                                                                                  Patient Demographics   Jill Gross, is a 60 y.o. female, DOB - 06-10-1956, ZDG:[id number]  Admit date - 10/31/2016   Admitting Physician Demetrios Loll, MD  Outpatient Primary MD for the patient is [name], NP   LOS - 4  Subjective:Says that her appetite is better, physical therapy recommended home physical therapy. No cough, no shortness of breath. Says that her weakness is better than before.  Review of Systems:   CONSTITUTIONAL: . Positive fatigue, positive weakness. No weight gain, no weight loss.  EYES: No blurry or double vision.  ENT: No tinnitus. No postnasal drip. No redness of the oropharynx.  RESPIRATORY: No cough, no wheeze, no hemoptysis. Positive dyspnea.  CARDIOVASCULAR: No chest pain. No orthopnea. No palpitations. No syncope.  GASTROINTESTINAL: No nausea, no vomiting or diarrhea. No abdominal pain. No melena or hematochezia.  GENITOURINARY: No dysuria or hematuria.  ENDOCRINE: No polyuria or nocturia. No heat or cold intolerance.  HEMATOLOGY: No anemia. No bruising. No bleeding.  INTEGUMENTARY: No rashes. No lesions.  MUSCULOSKELETAL: No arthritis. No swelling. No gout.  NEUROLOGIC: No numbness, tingling, or ataxia. No seizure-type activity.  PSYCHIATRIC: No anxiety. No insomnia. No ADD.    Vitals:   Vitals:   11/03/16 1020 11/03/16 1218 11/03/16 2112 11/04/16 0442  BP:  (!) 116/59 133/66 122/66  Pulse:  80 72 72  Resp:  '18 18 18  '$ Temp:  98.5 F (36.9 C) 97.5 F (36.4 C) 97.7 F (36.5 C)  TempSrc:  Oral Oral Oral  SpO2: (!) 84% 96% 97% 99%  Weight:      Height:        Wt Readings from Last 3 Encounters:  10/31/16 70.3 kg (155 lb)  10/28/16 70.5 kg (155 lb 6.8 oz)  09/23/16 69.7 kg (153 lb 10.6 oz)     Intake/Output  Summary (Last 24 hours) at 11/04/16 9518 Last data filed at 11/03/16 1847  Gross per 24 hour  Intake              480 ml  Output                0 ml  Net              480 ml    Physical Exam:   GENERAL: Pleasant-appearing Chronically ill-appearing HEAD, EYES, EARS, NOSE AND THROAT: Atraumatic, normocephalic. Extraocular muscles are intact. Pupils equal and reactive to light. Sclerae anicteric.  No conjunctival injection. No oro-pharyngeal erythema.  NECK: Supple. There is no jugular venous distention. No bruits, no lymphadenopathy, no thyromegaly.  HEART: Regular rate and rhythm,. No murmurs, no rubs, no clicks.  LUNGS: Clear to auscultation bilaterally. No rales or rhonchi. No wheezes.  ABDOMEN: Soft, flat, nontender, nondistended. Has good bowel sounds. No hepatosplenomegaly appreciated.  EXTREMITIES: No evidence of any cyanosis, clubbing, or peripheral edema.  +2 pedal and radial pulses bilaterally.  NEUROLOGIC: The patient is alert, awake, and oriented x3 with no focal motor or sensory deficits appreciated bilaterally.  SKIN: Moist and warm with no rashes appreciated.  Psych: Not anxious, depressed LN: No inguinal LN enlargement    Antibiotics   Anti-infectives    Start     Dose/Rate Route Frequency Ordered Stop   11/01/16 2200  azithromycin (ZITHROMAX) 500 mg in dextrose 5 % 250 mL IVPB  Status:  Discontinued     500 mg 250 mL/hr over 60 Minutes Intravenous Daily 10/31/16 2223 11/01/16 1021   11/01/16 2100  azithromycin (ZITHROMAX) tablet 500 mg     500 mg Oral Every 24 hours 11/01/16 1021 11/08/16 2059   11/01/16 0000  cefTRIAXone (ROCEPHIN) IVPB 1 g     1 g 100 mL/hr over 30 Minutes Intravenous Daily 10/31/16 2226 11/07/16 2159   10/31/16 2230  cefTRIAXone (ROCEPHIN) 1 g in dextrose 5 % 50 mL IVPB  Status:  Discontinued     1 g 100 mL/hr over 30 Minutes Intravenous Every 24 hours 10/31/16 2223 10/31/16 2226   10/31/16 2000  levofloxacin (LEVAQUIN) IVPB 750 mg     750  mg 100 mL/hr over 90 Minutes Intravenous  Once 10/31/16 1952 10/31/16 2125      Medications   Scheduled Meds: . amitriptyline  20 mg Oral QHS  . azithromycin  500 mg Oral Q24H  . cefTRIAXone  1 g Intravenous Daily  . dexamethasone  4 mg Oral Q breakfast  . enoxaparin (LOVENOX) injection  40 mg Subcutaneous QHS  . feeding supplement (ENSURE ENLIVE)  237 mL Oral BID BM  . levothyroxine  200 mcg Oral QAC breakfast  . megestrol  800 mg Oral Daily  . pantoprazole  40 mg Oral QAC breakfast  . simvastatin  10 mg Oral q1800  . zolpidem  10 mg Oral QHS   Continuous Infusions:  PRN Meds:.acetaminophen, guaiFENesin, ipratropium-albuterol, LORazepam, ondansetron (ZOFRAN) IV, prochlorperazine, promethazine   Data Review:   Micro Results Recent Results (from the past 240 hour(s))  Urine culture     Status: Abnormal   Collection Time: 10/28/16  3:35 PM  Result Value Ref Range Status   Specimen Description URINE, CLEAN CATCH  Final   Special Requests NONE  Final   Culture >=100,000 COLONIES/mL ESCHERICHIA COLI (A)  Final   Report Status 10/31/2016 FINAL  Final   Organism ID, Bacteria ESCHERICHIA COLI (A)  Final      Susceptibility   Escherichia coli - MIC*    AMPICILLIN >=32 RESISTANT Resistant     CEFAZOLIN <=4 SENSITIVE Sensitive     CEFTRIAXONE <=1 SENSITIVE Sensitive     CIPROFLOXACIN 0.5 SENSITIVE Sensitive     GENTAMICIN <=1 SENSITIVE Sensitive     IMIPENEM <=0.25 SENSITIVE Sensitive     NITROFURANTOIN <=16 SENSITIVE Sensitive     TRIMETH/SULFA >=320 RESISTANT Resistant     AMPICILLIN/SULBACTAM 16 INTERMEDIATE Intermediate     PIP/TAZO <=4 SENSITIVE Sensitive     Extended ESBL NEGATIVE Sensitive     * >=100,000 COLONIES/mL ESCHERICHIA  COLI  Blood culture (routine x 2)     Status: None (Preliminary result)   Collection Time: 10/31/16  8:12 PM  Result Value Ref Range Status   Specimen Description BLOOD LEFT ASSIST CONTROL  Final   Special Requests   Final    BOTTLES DRAWN  AEROBIC AND ANAEROBIC 13CCAERO,7CCANA   Culture NO GROWTH 4 DAYS  Final   Report Status PENDING  Incomplete  Blood culture (routine x 2)     Status: None (Preliminary result)   Collection Time: 10/31/16  8:22 PM  Result Value Ref Range Status   Specimen Description BLOOD RIGHT ASSIST CONTROL  Final   Special Requests   Final    BOTTLES DRAWN AEROBIC AND ANAEROBIC 12CCAERO,15CCANA   Culture NO GROWTH 4 DAYS  Final   Report Status PENDING  Incomplete  Urine culture     Status: Abnormal   Collection Time: 10/31/16 11:03 PM  Result Value Ref Range Status   Specimen Description URINE, RANDOM  Final   Special Requests NONE  Final   Culture 50,000 COLONIES/mL ESCHERICHIA COLI (A)  Final   Report Status 11/03/2016 FINAL  Final   Organism ID, Bacteria ESCHERICHIA COLI (A)  Final      Susceptibility   Escherichia coli - MIC*    AMPICILLIN >=32 RESISTANT Resistant     CEFAZOLIN <=4 SENSITIVE Sensitive     CEFTRIAXONE <=1 SENSITIVE Sensitive     CIPROFLOXACIN <=0.25 SENSITIVE Sensitive     GENTAMICIN <=1 SENSITIVE Sensitive     IMIPENEM <=0.25 SENSITIVE Sensitive     NITROFURANTOIN <=16 SENSITIVE Sensitive     TRIMETH/SULFA >=320 RESISTANT Resistant     AMPICILLIN/SULBACTAM 16 INTERMEDIATE Intermediate     PIP/TAZO <=4 SENSITIVE Sensitive     Extended ESBL NEGATIVE Sensitive     * 50,000 COLONIES/mL ESCHERICHIA COLI    Radiology Reports Dg Chest 2 View  Result Date: 10/31/2016 CLINICAL DATA:  Shortness of breath, weakness. EXAM: CHEST  2 VIEW COMPARISON:  Radiograph of January 23, 2016. FINDINGS: Stable cardiomediastinal silhouette. Atherosclerosis of thoracic aorta is noted. Right internal jugular Port-A-Cath is unchanged in position. No pneumothorax is noted. Stable left basilar scarring is noted. Increased left perihilar opacity is noted concerning for possible pneumonia or edema. Right basilar scarring or atelectasis is noted. Bony thorax is unremarkable. IMPRESSION: Aortic  atherosclerosis. Increased left perihilar opacity is noted concerning for pneumonia or edema. Electronically Signed   By: Marijo Conception, M.D.   On: 10/31/2016 19:15     CBC  Recent Labs Lab 10/28/16 1500 10/31/16 1552 11/02/16 0544 11/03/16 0818  WBC 8.7 9.7 4.7 4.3  HGB 11.5* 12.2 9.8* 10.0*  HCT 33.7* 35.4 28.4* 29.2*  PLT 195 216 147* 144*  MCV 93.9 93.0 95.1 94.4  MCH 31.9 32.1 32.9 32.2  MCHC 34.0 34.5 34.6 34.2  RDW 16.1* 16.4* 15.6* 15.9*  LYMPHSABS 1.0  --   --   --   MONOABS 0.3  --   --   --   EOSABS 0.0  --   --   --   BASOSABS 0.0  --   --   --     Chemistries   Recent Labs Lab 10/28/16 1500 10/31/16 1552 11/02/16 0544 11/03/16 0818 11/04/16 0346  NA 128* 129* 137 136 134*  K 3.7 4.0 3.3* 3.4* 3.7  CL 93* 94* 109 102 100*  CO2 25 20* 21* 25 25  GLUCOSE 172* 236* 125* 161* 180*  BUN 18 20 10  8 11  CREATININE 1.17* 1.32* 1.00 0.99 0.87  CALCIUM 8.8* 9.5 8.2* 8.8* 9.2  MG  --   --   --   --  1.9  AST 26  --   --   --   --   ALT 22  --   --   --   --   ALKPHOS 72  --   --   --   --   BILITOT 1.1  --   --   --   --    ------------------------------------------------------------------------------------------------------------------ estimated creatinine clearance is 63.2 mL/min (by C-G formula based on SCr of 0.87 mg/dL). ------------------------------------------------------------------------------------------------------------------ No results for input(s): HGBA1C in the last 72 hours. ------------------------------------------------------------------------------------------------------------------ No results for input(s): CHOL, HDL, LDLCALC, TRIG, CHOLHDL, LDLDIRECT in the last 72 hours. ------------------------------------------------------------------------------------------------------------------ No results for input(s): TSH, T4TOTAL, T3FREE, THYROIDAB in the last 72 hours.  Invalid input(s):  FREET3 ------------------------------------------------------------------------------------------------------------------  Recent Labs  11/01/16 0918  VITAMINB12 301    Coagulation profile No results for input(s): INR, PROTIME in the last 168 hours.  No results for input(s): DDIMER in the last 72 hours.  Cardiac Enzymes  Recent Labs Lab 10/31/16 1552  TROPONINI <0.03   ------------------------------------------------------------------------------------------------------------------ Invalid input(s): POCBNP    Assessment & Plan  Patient is a 60 year old with history of lung cancer presenting with generalized weakness 1. Community-acquired pneumonia Continue therapy with IV Rocephin /today is day 5 of IV antibiotics to cover for Escherichia coli UTI, community-acquired pneumonia on the left side over the longer. Overall patient states that she feels better than before. Continue IV antibiotics today also, likely discharge tomorrow morning with by mouth antibiotics, physical therapy at home, discussed this with patient's family, they are agreeable for this. 2. Acute renal failure due to dehydration. Resolved with IV fluids 3. Generalized weakness suspect is multifactorial including severe hypothyroidism, UTI as well as pneumonia, PT  4. TSH greater than 200- patient received IV synthyroid. Started on Synthroid 200 g by mouth daily. Patient stopped taking thyroid medicine since August.  5. Chronic hyponatremia. Stable. 6. Small cell lung cancer, status post chemotherapy and radiation this August. Follow-up oncologist as outpatient. 7. hyperlidemia continue zocor #8 anorexia: Started on Megace. Her appetite is improving. Watch her today, likely discharge tomorrow home /;,    Code Status Orders        Start     Ordered   10/31/16 2223  Full code  Continuous     10/31/16 2223    Code Status History    Date Active Date Inactive Code Status Order ID Comments User Context    This patient has a current code status but no historical code status.           Consults none   DVT Prophylaxis  Lovenox    Lab Results  Component Value Date   PLT 144 (L) 11/03/2016     Time Spent in minutes   38mn  Greater than 50% of time spent in care coordination and counseling patient regarding the condition and plan of care.   KEpifanio LeschesM.D on 11/03/2016 at 8:38 AM  Between 7am to 6pm - Pager - [phone]  After 6pm go to [email or web address] - password [email or web address]   Office  [phone]

## 2016-11-05 LAB — CULTURE, BLOOD (ROUTINE X 2)
CULTURE: NO GROWTH
Culture: NO GROWTH

## 2016-11-05 MED ORDER — AZITHROMYCIN 500 MG PO TABS: 500.0000 mg | ORAL_TABLET | ORAL | 0 refills | Status: DC

## 2016-11-05 MED ORDER — LEVOTHYROXINE SODIUM 200 MCG PO TABS: 200.0000 ug | ORAL_TABLET | Freq: Every day | ORAL | 0 refills | Status: DC

## 2016-11-05 MED ORDER — AMOXICILLIN-POT CLAVULANATE 875-125 MG PO TABS: 1.0000 | ORAL_TABLET | Freq: Two times a day (BID) | ORAL | 0 refills | Status: DC

## 2016-11-05 MED ORDER — ENSURE ENLIVE PO LIQD: 237.0000 mL | Freq: Two times a day (BID) | ORAL | 12 refills | Status: DC

## 2016-11-05 MED ORDER — MEGESTROL ACETATE 400 MG/10ML PO SUSP: 800.0000 mg | Freq: Every day | ORAL | 0 refills | Status: DC

## 2016-11-05 NOTE — Care Management (Signed)
Physical therapy recommending skilled nursing facility. Jill Gross would like to go home. Home Health and therapy orders faxed to [facility].  Discussed that [facility] would be calling them with a home health agency. Will need home oxygen. Floydene Flock, [facility] representative will run the oxygen, bedside commode, and rolling walker request. Discharge to home today per Dr. Nira Retort RN MSN CCM Care Management

## 2016-11-05 NOTE — Progress Notes (Signed)
SATURATION QUALIFICATIONS: (This note is used to comply with regulatory documentation for home oxygen)  Patient Saturations on Room Air at Rest = 85%  Patient Saturations on Room Air while Ambulating = 0%  Patient Saturations on 0 Liters of oxygen while Ambulating = 0%  Please briefly explain why patient needs home oxygen: hx of COPD and lung cancer

## 2016-11-05 NOTE — Progress Notes (Signed)
Discharge instructions explained to pt and pts spouse/ verbalized an understanding/ iv removed/ o2 tank/ BSC/ walker delivered to room/ will transport off unit via wheelchair.

## 2016-11-05 NOTE — Care Management (Signed)
Mr. Meriweather called [facility] [phone]). Spoke with representative. No co-pay for Nursing, physical therapy, home oxygen, rolling walker, and bedside commode. [facility] will provide equipment.   Husband will transport Shelbie Ammons RN MSN CCM Care Management

## 2016-11-07 DIAGNOSIS — J189 Pneumonia, unspecified organism: Secondary | ICD-10-CM

## 2016-11-07 HISTORY — DX: Pneumonia, unspecified organism: J18.9

## 2016-11-08 NOTE — Discharge Summary (Signed)
Jill Gross, is a 60 y.o. female  DOB [birth year]  MRN [id number].  Admission date:  10/31/2016  Admitting Physician  Demetrios Loll, MD  Discharge Date:  11/05/2016   Primary MD  [name], NP  Recommendations for primary care physician for things to follow:   Follow up with PMD in one week   Admission Diagnosis  Weakness [R53.1] Community acquired pneumonia of left lung, unspecified part of lung [J18.9]   Discharge Diagnosis  Weakness [R53.1] Community acquired pneumonia of left lung, unspecified part of lung [J18.9]    Active Problems:   PNA (pneumonia)      Past Medical History:  Diagnosis Date  . Allergy   . Anemia    pernicious  . Anxiety   . Arthritis   . Atrophic vaginitis   . COPD (chronic obstructive pulmonary disease) ([place])   . Depression   . Diverticulosis   . Dyspnea   . Fatigue   . GERD (gastroesophageal reflux disease)   . History of chemotherapy 04/2016  . Hyperlipidemia   . Hypothyroid   . Insomnia   . Low back pain   . Lumbago   . Neuropathy due to chemotherapeutic drug ([place])   . Osteopenia   . Small cell carcinoma of left lung ([place]) 01/2016   rad + chemo tx's  . Tobacco use   . Vitamin B12 deficiency     Past Surgical History:  Procedure Laterality Date  . ABDOMINAL HYSTERECTOMY    . ANKLE SURGERY     x4  . CARPAL TUNNEL RELEASE  April 2015  . CHOLECYSTECTOMY    . ENDOBRONCHIAL ULTRASOUND N/A 01/07/2016   Procedure: ENDOBRONCHIAL ULTRASOUND;  Surgeon: Vilinda Boehringer, MD;  Location: [facility] ORS;  Service: Cardiopulmonary;  Laterality: N/A;  . ENDOBRONCHIAL ULTRASOUND N/A 09/16/2016   Procedure: ENDOBRONCHIAL ULTRASOUND;  Surgeon: Vilinda Boehringer, MD;  Location: [facility] ORS;  Service: Cardiopulmonary;  Laterality: N/A;  . HERNIA REPAIR    . PORTACATH PLACEMENT Right 01/23/2016   Procedure: INSERTION PORT-A-CATH;  Surgeon: [name], MD;  Location: [facility] ORS;  Service: General;  Laterality: Right;       History of present illness and  Hospital Course:     Kindly see H&P for history of present illness and admission details, please review complete Labs, Consult reports and Test reports for all details in brief  HPI  from the history and physical done on the day of admission Patient is a 60 year old with history of lung cancer presenting with generalized weakness 1. Community-acquired pneumonia   Hospital Course  1.Community-acquired pneumonia;received IV rocephin for 5 days,discharged home with po augmentin and azithromycin..  2. Acute renal failure due to dehydration. Resolved with IV fluids 3. Generalized weakness suspect is multifactorial including severe hypothyroidism, UTI as well as pneumonia,  4. TSH greater than 200- patient received IV synthyroid. Started on Synthroid 200 g by mouth daily. Patient stopped taking thyroid medicine since August.  5. Chronic hyponatremia. Stable. 6. Small cell lung cancer, status post chemotherapy and radiation this August. Follow-up oncologist as outpatient. 7. hyperlidemia continue zocor #8 anorexia;improved afer starting megace. 9.Ecoli UTI.tretaed with IV Ropcephin FOR 55 days. 10.generalized wekaness;PT recommends Home health PTvs STR.family chose. Home with home PT, 11.Hypoxia on RA; required o2.pt o2 sats on RA at rest 85%.the patient has  Lung CA and copd.   Discharge Condition: stable   Follow UP  Follow-up Information    [name], NP Follow up on 11/18/2016.   Specialties:  Nurse  Practitioner, Family Medicine Why:  @ 10:30 am. for follow-up appointment. Contact information: [address] [place] [address] [phone]             Discharge Instructions  and  Discharge Medications    Discharge Instructions    Face-to-face encounter (required for Medicare/Medicaid patients)    Complete by:   As directed    I [facility] certify that this patient is under my care and that I, or a nurse practitioner or physician's assistant working with me, had a face-to-face encounter that meets the physician face-to-face encounter requirements with this patient on 11/05/2016. The encounter with the patient was in whole, or in part for the following medical condition(s) which is the primary reason for home health care  Pneumonia UTI deconditioning Severe hypothyroid   The encounter with the patient was in whole, or in part, for the following medical condition, which is the primary reason for home health care:  whole   I certify that, based on my findings, the following services are medically necessary home health services:  Physical therapy   Reason for Medically Necessary Home Health Services:  Therapy- Instruction on use of Assistive Device for Ambulation on all Surfaces   My clinical findings support the need for the above services:  Unable to leave home safely without assistance and/or assistive device   Further, I certify that my clinical findings support that this patient is homebound due to:  Unsafe ambulation due to balance issues   Home Health    Complete by:  As directed    To provide the following care/treatments:  PT       Medication List    STOP taking these medications   albuterol 108 (90 Base) MCG/ACT inhaler Commonly known as:  PROAIR HFA   lidocaine-prilocaine cream Commonly known as:  EMLA   zolpidem 10 MG tablet Commonly known as:  AMBIEN     TAKE these medications   AMBULATORY NON FORMULARY MEDICATION Medication Name: incentive spirometer   amitriptyline 10 MG tablet Commonly known as:  ELAVIL Take 2 tablets (20 mg total) by mouth at bedtime.   amoxicillin-clavulanate 875-125 MG tablet Commonly known as:  AUGMENTIN Take 1 tablet by mouth 2 (two) times daily.   azithromycin 500 MG tablet Commonly known as:  ZITHROMAX Take 1 tablet (500 mg total) by  mouth daily.   cyanocobalamin 1000 MCG/ML injection Commonly known as:  (VITAMIN B-12) Inject 1 mL (1,000 mcg total) into the muscle once. What changed:  when to take this   dexamethasone 4 MG tablet Commonly known as:  DECADRON Take 1 tablet (4 mg total) by mouth daily.   feeding supplement (ENSURE ENLIVE) Liqd Take 237 mLs by mouth 2 (two) times daily between meals.   levothyroxine 200 MCG tablet Commonly known as:  SYNTHROID, LEVOTHROID Take 1 tablet (200 mcg total) by mouth daily before breakfast. What changed:  medication strength  how much to take   LORazepam 0.5 MG tablet Commonly known as:  ATIVAN Take 1 tablet (0.5 mg total) by mouth every 8 (eight) hours as needed for anxiety.   megestrol 400 MG/10ML suspension Commonly known as:  MEGACE Take 20 mLs (800 mg total) by mouth daily.   ondansetron 8 MG tablet Commonly known as:  ZOFRAN Take 1 tablet (8 mg total) by mouth 2 (two) times daily as needed. Start on the third day after cisplatin chemotherapy.   prochlorperazine 10 MG tablet Commonly known as:  COMPAZINE Take 1 tablet (10 mg total)  by mouth every 6 (six) hours as needed (Nausea or vomiting).   promethazine 25 MG tablet Commonly known as:  PHENERGAN Take 1 tablet (25 mg total) by mouth every 6 (six) hours as needed for nausea or vomiting.   ranitidine 150 MG tablet Commonly known as:  ZANTAC Take 150 mg by mouth 2 (two) times daily as needed for heartburn.   simvastatin 10 MG tablet Commonly known as:  ZOCOR Take 1 tablet (10 mg total) by mouth daily at 6 PM.         Diet and Activity recommendation: See Discharge Instructions above   Consults obtained -PT,oncology   Major procedures and Radiology Reports - PLEASE review detailed and final reports for all details, in brief -     Dg Chest 2 View  Result Date: 10/31/2016 CLINICAL DATA:  Shortness of breath, weakness. EXAM: CHEST  2 VIEW COMPARISON:  Radiograph of January 23, 2016.  FINDINGS: Stable cardiomediastinal silhouette. Atherosclerosis of thoracic aorta is noted. Right internal jugular Port-A-Cath is unchanged in position. No pneumothorax is noted. Stable left basilar scarring is noted. Increased left perihilar opacity is noted concerning for possible pneumonia or edema. Right basilar scarring or atelectasis is noted. Bony thorax is unremarkable. IMPRESSION: Aortic atherosclerosis. Increased left perihilar opacity is noted concerning for pneumonia or edema. Electronically Signed   By: Marijo Conception, M.D.   On: 10/31/2016 19:15    Micro Results    Recent Results (from the past 240 hour(s))  Blood culture (routine x 2)     Status: None   Collection Time: 10/31/16  8:12 PM  Result Value Ref Range Status   Specimen Description BLOOD LEFT ASSIST CONTROL  Final   Special Requests   Final    BOTTLES DRAWN AEROBIC AND ANAEROBIC 13CCAERO,7CCANA   Culture NO GROWTH 5 DAYS  Final   Report Status 11/05/2016 FINAL  Final  Blood culture (routine x 2)     Status: None   Collection Time: 10/31/16  8:22 PM  Result Value Ref Range Status   Specimen Description BLOOD RIGHT ASSIST CONTROL  Final   Special Requests   Final    BOTTLES DRAWN AEROBIC AND ANAEROBIC 12CCAERO,15CCANA   Culture NO GROWTH 5 DAYS  Final   Report Status 11/05/2016 FINAL  Final  Urine culture     Status: Abnormal   Collection Time: 10/31/16 11:03 PM  Result Value Ref Range Status   Specimen Description URINE, RANDOM  Final   Special Requests NONE  Final   Culture 50,000 COLONIES/mL ESCHERICHIA COLI (A)  Final   Report Status 11/03/2016 FINAL  Final   Organism ID, Bacteria ESCHERICHIA COLI (A)  Final      Susceptibility   Escherichia coli - MIC*    AMPICILLIN >=32 RESISTANT Resistant     CEFAZOLIN <=4 SENSITIVE Sensitive     CEFTRIAXONE <=1 SENSITIVE Sensitive     CIPROFLOXACIN <=0.25 SENSITIVE Sensitive     GENTAMICIN <=1 SENSITIVE Sensitive     IMIPENEM <=0.25 SENSITIVE Sensitive      NITROFURANTOIN <=16 SENSITIVE Sensitive     TRIMETH/SULFA >=320 RESISTANT Resistant     AMPICILLIN/SULBACTAM 16 INTERMEDIATE Intermediate     PIP/TAZO <=4 SENSITIVE Sensitive     Extended ESBL NEGATIVE Sensitive     * 50,000 COLONIES/mL ESCHERICHIA COLI       Today   Subjective:   Helene Bernstein today has no headache,no chest abdominal pain,no new weakness tingling or numbness, feels much better wants to go home  today.   Objective:   Blood pressure 104/63, pulse 93, temperature 98.4 F (36.9 C), temperature source Oral, resp. rate 16, height '5\' 2"'$  (1.575 m), weight 70.3 kg (155 lb), SpO2 95 %.  No intake or output data in the 24 hours ending 11/08/16 0714  Exam Awake Alert, Oriented x 3, No new F.N deficits, Normal affect [place].AT,PERRAL Supple Neck,No JVD, No cervical lymphadenopathy appriciated.  Symmetrical Chest wall movement, Good air movement bilaterally, CTAB RRR,No Gallops,Rubs or new Murmurs, No Parasternal Heave +ve B.Sounds, Abd Soft, Non tender, No organomegaly appriciated, No rebound -guarding or rigidity. No Cyanosis, Clubbing or edema, No new Rash or bruise  Data Review   CBC w Diff:  Lab Results  Component Value Date   WBC 4.3 11/03/2016   HGB 10.0 (L) 11/03/2016   HCT 29.2 (L) 11/03/2016   HCT 41.7 12/26/2015   PLT 144 (L) 11/03/2016   PLT 326 12/26/2015   LYMPHOPCT 11 10/28/2016   MONOPCT 4 10/28/2016   EOSPCT 0 10/28/2016   BASOPCT 0 10/28/2016    CMP:  Lab Results  Component Value Date   NA 134 (L) 11/04/2016   NA 143 12/28/2015   K 3.7 11/04/2016   CL 100 (L) 11/04/2016   CO2 25 11/04/2016   BUN 11 11/04/2016   BUN 5 (L) 12/28/2015   CREATININE 0.87 11/04/2016   PROT 7.3 10/28/2016   PROT 6.9 09/10/2015   ALBUMIN 3.5 10/28/2016   ALBUMIN 4.2 09/10/2015   BILITOT 1.1 10/28/2016   BILITOT 0.4 09/10/2015   ALKPHOS 72 10/28/2016   AST 26 10/28/2016   ALT 22 10/28/2016  .   Total Time in preparing paper work, data evaluation and  todays exam - 59 minutes  Diogo Anne M.D on 11/05/2016 at 7:14 AM    Note: This dictation was prepared with Dragon dictation along with smaller phrase technology. Any transcriptional errors that result from this process are unintentional.

## 2016-11-18 ENCOUNTER — Encounter: Payer: Self-pay | Admitting: Unknown Physician Specialty

## 2016-11-18 ENCOUNTER — Ambulatory Visit (INDEPENDENT_AMBULATORY_CARE_PROVIDER_SITE_OTHER): Payer: Managed Care, Other (non HMO) | Admitting: Unknown Physician Specialty

## 2016-11-18 VITALS — BP 118/78 | HR 121 | Temp 98.2°F | Wt 150.0 lb

## 2016-11-18 DIAGNOSIS — Z09 Encounter for follow-up examination after completed treatment for conditions other than malignant neoplasm: Secondary | ICD-10-CM

## 2016-11-18 DIAGNOSIS — R0902 Hypoxemia: Secondary | ICD-10-CM | POA: Insufficient documentation

## 2016-11-18 DIAGNOSIS — E871 Hypo-osmolality and hyponatremia: Secondary | ICD-10-CM | POA: Diagnosis not present

## 2016-11-18 DIAGNOSIS — E039 Hypothyroidism, unspecified: Secondary | ICD-10-CM | POA: Diagnosis not present

## 2016-11-18 DIAGNOSIS — I10 Essential (primary) hypertension: Secondary | ICD-10-CM

## 2016-11-18 DIAGNOSIS — N179 Acute kidney failure, unspecified: Secondary | ICD-10-CM

## 2016-11-18 DIAGNOSIS — D510 Vitamin B12 deficiency anemia due to intrinsic factor deficiency: Secondary | ICD-10-CM | POA: Diagnosis not present

## 2016-11-18 DIAGNOSIS — G4700 Insomnia, unspecified: Secondary | ICD-10-CM

## 2016-11-18 MED ORDER — CYANOCOBALAMIN 1000 MCG/ML IJ SOLN
1000.0000 ug | INTRAMUSCULAR | Status: DC
  Administered 2016-11-18: 1000 ug via INTRAMUSCULAR

## 2016-11-18 NOTE — Progress Notes (Signed)
BP 118/78 (BP Location: Left Arm, Patient Position: Sitting, Cuff Size: Normal)   Pulse (!) 121   Temp 98.2 F (36.8 C)   Wt 150 lb (68 kg)   LMP  (LMP Unknown)   SpO2 98%   BMI 27.44 kg/m    Subjective:    Patient ID: Jill Gross, female    DOB: [birth year], 60 y.o.   MRN: [id number]  HPI: Jill Gross is a 60 y.o. female  Chief Complaint  Patient presents with  . Hospitalization Follow-up    pneumonia  . Injections    pt states she would like b12 injections   . Ear Fullness    pt states she cannot hear out of her right ear and it has been like that since August   Pt is here for hospital f/u.  Discharge notes were reviewed.  The following was identified:  Community-acquired pneumonia;received IV rocephin for 5 days and discharged home with po augmentin and azithromycin..  Small cell Lung Cancer: [facility] through [facility]  Acute renal failure: resolved with fluids  Hypothyroid: Off meds since August.  TSH greater than 200- patient received IV synthyroid.Started on Synthroid 200 g by mouth daily.   Chronic hyponatremia.  Generalized wekaness: Multifactoral.  PT was ordered and starting today  Hypoxia/COPD:  On 2 Liters O2.  Not taking inhalers  Additionally History of B12 deficiency No B12 for a period of time  Insomnia Is continuing Ambien    Relevant past medical, surgical, family and social history reviewed and updated as indicated. Interim medical history since our last visit reviewed. Allergies and medications reviewed and updated.  Review of Systems  Genitourinary:       Urinary incontinence    Per HPI unless specifically indicated above     Objective:    BP 118/78 (BP Location: Left Arm, Patient Position: Sitting, Cuff Size: Normal)   Pulse (!) 121   Temp 98.2 F (36.8 C)   Wt 150 lb (68 kg)   LMP  (LMP Unknown)   SpO2 98%   BMI 27.44 kg/m   Wt Readings from Last 3 Encounters:  11/18/16 150 lb (68 kg)  10/31/16 155  lb (70.3 kg)  10/28/16 155 lb 6.8 oz (70.5 kg)    Physical Exam  Constitutional: She is oriented to person, place, and time. Vital signs are normal. She appears well-nourished.  Chronically ill  HENT:  Head: Normocephalic and atraumatic.  No hair  Cardiovascular: Normal rate and regular rhythm.   Pulmonary/Chest: She has decreased breath sounds in the left lower field. She has wheezes in the right lower field and the left lower field.  LLLF with wheezing and decreased breath sounds  Neurological: She is alert and oriented to person, place, and time.  Skin: Skin is warm and dry.  Psychiatric: She has a normal mood and affect. Her speech is normal and behavior is normal. Judgment and thought content normal. Cognition and memory are normal.    Assessment & Plan:   Problem List Items Addressed This Visit      Unprioritized   Hypertension    Stable, continue present medications.        Hyponatremia    Check CMP      Relevant Orders   Comprehensive metabolic panel   Hypothyroidism    Continue present treatment and recheck TSH 2 months      Hypoxia    Stay on O2.  Pt ed on activity  Insomnia    OK for Ambien.  We have been talking about weaning off for years      Pernicious anemia    Check B12      Relevant Medications   cyanocobalamin ((VITAMIN B-12)) injection 1,000 mcg   Other Relevant Orders   B12    Other Visit Diagnoses    Acute renal failure, unspecified acute renal failure type [facility])    -  Primary   Check CMP   Relevant Orders   Comprehensive metabolic panel   Hospital discharge follow-up           Follow up plan: Return in about 4 weeks (around 12/16/2016).

## 2016-11-18 NOTE — Assessment & Plan Note (Signed)
Check B12 

## 2016-11-18 NOTE — Assessment & Plan Note (Signed)
Check CMP.  ?

## 2016-11-18 NOTE — Assessment & Plan Note (Signed)
Stable, continue present medications.   

## 2016-11-18 NOTE — Assessment & Plan Note (Signed)
Stay on O2.  Pt ed on activity

## 2016-11-18 NOTE — Assessment & Plan Note (Signed)
Continue present treatment and recheck TSH 2 months

## 2016-11-18 NOTE — Assessment & Plan Note (Signed)
OK for Ambien.  We have been talking about weaning off for years

## 2016-11-19 ENCOUNTER — Encounter: Payer: Self-pay | Admitting: Unknown Physician Specialty

## 2016-11-19 LAB — COMPREHENSIVE METABOLIC PANEL
ALK PHOS: 72 IU/L (ref 39–117)
ALT: 48 IU/L — ABNORMAL HIGH (ref 0–32)
AST: 32 IU/L (ref 0–40)
Albumin/Globulin Ratio: 1.2 (ref 1.2–2.2)
Albumin: 4 g/dL (ref 3.6–4.8)
BILIRUBIN TOTAL: 0.5 mg/dL (ref 0.0–1.2)
BUN/Creatinine Ratio: 11 — ABNORMAL LOW (ref 12–28)
BUN: 9 mg/dL (ref 8–27)
CHLORIDE: 99 mmol/L (ref 96–106)
CO2: 22 mmol/L (ref 18–29)
CREATININE: 0.84 mg/dL (ref 0.57–1.00)
Calcium: 9.5 mg/dL (ref 8.7–10.3)
GFR calc Af Amer: 87 mL/min/{1.73_m2} (ref >59)
GFR calc non Af Amer: 76 mL/min/{1.73_m2} (ref >59)
GLOBULIN, TOTAL: 3.3 g/dL (ref 1.5–4.5)
GLUCOSE: 166 mg/dL — AB (ref 65–99)
Potassium: 4.7 mmol/L (ref 3.5–5.2)
SODIUM: 138 mmol/L (ref 134–144)
Total Protein: 7.3 g/dL (ref 6.0–8.5)

## 2016-11-19 LAB — VITAMIN B12: Vitamin B-12: 338 pg/mL (ref 232–1245)

## 2016-11-24 NOTE — Progress Notes (Deleted)
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  INTERVAL HISTORY: Patient returns to clinic today for further evaluation she continues to have profound weakness and fatigue. She continues to have shortness of breath and wheezing. She has a poor appetite.  She also has continued neuropathy particularly in her fingertips. She complains of new onset urinary frequency. She has no other neurologic complaints. She denies any recent fevers. She denies any chest pain or hemoptysis. She denies any nausea, vomiting, constipation, or diarrhea. She has no urinary complaints. Patient feels generally terrible, but offers no further specific complaints.   REVIEW OF SYSTEMS:   Review of Systems  Constitutional: Positive for malaise/fatigue. Negative for fever and weight loss.  HENT: Negative for sore throat.   Eyes: Negative.   Respiratory: Positive for cough and shortness of breath. Negative for hemoptysis.   Cardiovascular: Negative.  Negative for chest pain.  Gastrointestinal: Negative for nausea and vomiting.  Genitourinary: Negative.   Musculoskeletal: Negative.   Skin: Negative.   Neurological: Positive for sensory change and weakness. Negative for dizziness and tingling.  Psychiatric/Behavioral: Positive for depression.    As per HPI. Otherwise, a complete review of systems is negative.  PAST MEDICAL HISTORY: Past Medical History:  Diagnosis Date  . Allergy   . Anemia    pernicious  . Anxiety   . Arthritis   . Atrophic vaginitis   . COPD (chronic obstructive pulmonary disease) ([place])   . Depression   . Diverticulosis   . Dyspnea   . Fatigue   . GERD (gastroesophageal reflux disease)   . History of  chemotherapy 04/2016  . Hyperlipidemia   . Hypothyroid   . Insomnia   . Low back pain   . Lumbago   . Neuropathy due to chemotherapeutic drug ([place])   . Osteopenia   . Small cell carcinoma of left lung ([place]) 01/2016   rad + chemo tx's  . Tobacco use   . Vitamin B12 deficiency     PAST SURGICAL HISTORY: Past Surgical History:  Procedure Laterality Date  . ABDOMINAL HYSTERECTOMY    . ANKLE SURGERY     x4  . CARPAL TUNNEL RELEASE  April 2015  . CHOLECYSTECTOMY    . ENDOBRONCHIAL ULTRASOUND N/A 01/07/2016   Procedure: ENDOBRONCHIAL ULTRASOUND;  Surgeon: Vilinda Boehringer, MD;  Location: [facility] ORS;  Service: Cardiopulmonary;  Laterality: N/A;  . ENDOBRONCHIAL ULTRASOUND N/A 09/16/2016   Procedure: ENDOBRONCHIAL ULTRASOUND;  Surgeon: Vilinda Boehringer, MD;  Location: [facility] ORS;  Service: Cardiopulmonary;  Laterality: N/A;  . HERNIA REPAIR    . PORTACATH PLACEMENT Right 01/23/2016   Procedure: INSERTION PORT-A-CATH;  Surgeon: [name], MD;  Location: [facility] ORS;  Service: General;  Laterality: Right;    FAMILY HISTORY Family History  Problem Relation Age of Onset  . Emphysema Mother   . Diabetes Father        ADVANCED DIRECTIVES:    HEALTH MAINTENANCE: Social History  Substance Use Topics  . Smoking status: Former Smoker    Packs/day: 1.00    Years: 30.00    Types: Cigarettes    Quit date: 01/11/2016  . Smokeless tobacco: Never Used  . Alcohol use No     Allergies  Allergen Reactions  . Macrobid [  Nitrofurantoin] Anaphylaxis  . Skelaxin [Metaxalone] Anaphylaxis  . Effexor [Venlafaxine]   . Hctz [Hydrochlorothiazide] Other (See Comments)    cramps  . Neurontin [Gabapentin]   . Paxil [Paroxetine Hcl]   . Pravachol [Pravastatin Sodium] Other (See Comments)    aching  . Prozac [Fluoxetine Hcl]   . Sulfur Other (See Comments)    "blisters in mouth"  . Wellbutrin [Bupropion] Itching  . Zoloft [Sertraline Hcl]     Current Outpatient Prescriptions  Medication Sig Dispense  Refill  . AMBULATORY NON FORMULARY MEDICATION Medication Name: incentive spirometer 1 each 0  . amitriptyline (ELAVIL) 10 MG tablet Take 2 tablets (20 mg total) by mouth at bedtime. 60 tablet 12  . cyanocobalamin (,VITAMIN B-12,) 1000 MCG/ML injection Inject 1 mL (1,000 mcg total) into the muscle once. (Patient taking differently: Inject 1,000 mcg into the muscle every 30 (thirty) days. ) 10 mL 1  . dexamethasone (DECADRON) 4 MG tablet Take 1 tablet (4 mg total) by mouth daily. (Patient not taking: Reported on 11/18/2016) 30 tablet 0  . feeding supplement, ENSURE ENLIVE, (ENSURE ENLIVE) LIQD Take 237 mLs by mouth 2 (two) times daily between meals. 237 mL 12  . levothyroxine (SYNTHROID, LEVOTHROID) 200 MCG tablet Take 1 tablet (200 mcg total) by mouth daily before breakfast. 30 tablet 0  . LORazepam (ATIVAN) 0.5 MG tablet Take 1 tablet (0.5 mg total) by mouth every 8 (eight) hours as needed for anxiety. 30 tablet 0  . megestrol (MEGACE) 400 MG/10ML suspension Take 20 mLs (800 mg total) by mouth daily. (Patient not taking: Reported on 11/18/2016) 240 mL 0  . ondansetron (ZOFRAN) 8 MG tablet Take 1 tablet (8 mg total) by mouth 2 (two) times daily as needed. Start on the third day after cisplatin chemotherapy. 30 tablet 1  . prochlorperazine (COMPAZINE) 10 MG tablet Take 1 tablet (10 mg total) by mouth every 6 (six) hours as needed (Nausea or vomiting). 30 tablet 1  . promethazine (PHENERGAN) 25 MG tablet Take 1 tablet (25 mg total) by mouth every 6 (six) hours as needed for nausea or vomiting. 30 tablet 3  . ranitidine (ZANTAC) 150 MG tablet Take 150 mg by mouth 2 (two) times daily as needed for heartburn.    . simvastatin (ZOCOR) 10 MG tablet Take 1 tablet (10 mg total) by mouth daily at 6 PM. 30 tablet 6  . zolpidem (AMBIEN) 10 MG tablet Take 10 mg by mouth at bedtime.     Current Facility-Administered Medications  Medication Dose Route Frequency Provider Last Rate Last Dose  . cyanocobalamin  ((VITAMIN B-12)) injection 1,000 mcg  1,000 mcg Intramuscular Q30 days [name], NP   1,000 mcg at 11/18/16 1135    OBJECTIVE: There were no vitals filed for this visit.   There is no height or weight on file to calculate BMI.    ECOG FS:3 - Symptomatic, >50% confined to bed  General: Ill-appearing. Sitting in wheelchair Eyes: Pink conjunctiva, anicteric sclera. HEENT: Oropharynx clear without erythema or exudate. Lungs: Clear to auscultation bilaterally. Heart: Tachycardia. No rubs, murmurs, or gallops. Musculoskeletal: No edema, cyanosis, or clubbing. Neuro: Alert, answering all questions appropriately. Cranial nerves grossly intact. Skin: No rashes or petechiae noted. Poor skin turgor. Psych: Normal affect.   LAB RESULTS:  Lab Results  Component Value Date   NA 138 11/18/2016   K 4.7 11/18/2016   CL 99 11/18/2016   CO2 22 11/18/2016   GLUCOSE 166 (H) 11/18/2016   BUN 9 11/18/2016  CREATININE 0.84 11/18/2016   CALCIUM 9.5 11/18/2016   PROT 7.3 11/18/2016   ALBUMIN 4.0 11/18/2016   AST 32 11/18/2016   ALT 48 (H) 11/18/2016   ALKPHOS 72 11/18/2016   BILITOT 0.5 11/18/2016   GFRNONAA 76 11/18/2016   GFRAA 87 11/18/2016    Lab Results  Component Value Date   WBC 4.3 11/03/2016   NEUTROABS 7.4 (H) 10/28/2016   HGB 10.0 (L) 11/03/2016   HCT 29.2 (L) 11/03/2016   MCV 94.4 11/03/2016   PLT 144 (L) 11/03/2016     STUDIES: Dg Chest 2 View  Result Date: 10/31/2016 CLINICAL DATA:  Shortness of breath, weakness. EXAM: CHEST  2 VIEW COMPARISON:  Radiograph of January 23, 2016. FINDINGS: Stable cardiomediastinal silhouette. Atherosclerosis of thoracic aorta is noted. Right internal jugular Port-A-Cath is unchanged in position. No pneumothorax is noted. Stable left basilar scarring is noted. Increased left perihilar opacity is noted concerning for possible pneumonia or edema. Right basilar scarring or atelectasis is noted. Bony thorax is unremarkable. IMPRESSION: Aortic  atherosclerosis. Increased left perihilar opacity is noted concerning for pneumonia or edema. Electronically Signed   By: Marijo Conception, M.D.   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  PLAN:    1. 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 Bronchoscopy and biopsy results noted with no obvious evidence of recurrence either. Lower lobe collapse appears to be secondary to mucous plugging and radiation changes. Will repeat CT scan in December or January.  2. Shortness of breath/cough: Likely secondary to collapsed lung. No obvious evidence of malignancy. Patient with no improvement despite 10 days of Levaquin and extended prednisone taper. She was given 4 mg of Decadron daily today.   3. Poor appetite: Likely multifactorial, Decadron as above.  4. Dysphagia: Resolved. 5. Hypokalemia: Potassium now within normal limits continue oral supplementation as prescribed. 6. Peripheral neuropathy: Patient is no longer taking gabapentin. 7. Anemia: Significantly improved, monitor. 8. Weakness and fatigue: Unclear etiology, but likely multifactorial. Thyroid studies pending at time of dictation. 9. Disposition: Return to clinic in 1 month for further evaluation.  Approximately 30 minutes was spent in discussion of which greater than 50% was consultation.  Patient expressed understanding and was in agreement with this plan. She also understands that She can call clinic at any time with any questions, concerns, or complaints.    Lloyd Huger, MD   11/24/2016 9:46 PM

## 2016-11-25 ENCOUNTER — Inpatient Hospital Stay: Payer: Managed Care, Other (non HMO) | Admitting: Oncology

## 2016-11-25 ENCOUNTER — Inpatient Hospital Stay
Admission: EM | Admit: 2016-11-25 | Discharge: 2016-11-28 | DRG: 176 | Disposition: A | Discharge status: Home Health | Payer: Managed Care, Other (non HMO) | Attending: Specialist | Admitting: Specialist

## 2016-11-25 ENCOUNTER — Inpatient Hospital Stay (HOSPITAL_COMMUNITY)
Admit: 2016-11-25 | Discharge: 2016-11-25 | Disposition: A | Discharge status: Home / Self Care | Payer: Managed Care, Other (non HMO) | Attending: Internal Medicine | Admitting: Internal Medicine

## 2016-11-25 ENCOUNTER — Encounter: Payer: Self-pay | Admitting: Emergency Medicine

## 2016-11-25 ENCOUNTER — Inpatient Hospital Stay: Payer: Managed Care, Other (non HMO)

## 2016-11-25 ENCOUNTER — Emergency Department: Payer: Managed Care, Other (non HMO)

## 2016-11-25 DIAGNOSIS — Z888 Allergy status to other drugs, medicaments and biological substances status: Secondary | ICD-10-CM

## 2016-11-25 DIAGNOSIS — Z881 Allergy status to other antibiotic agents status: Secondary | ICD-10-CM | POA: Diagnosis not present

## 2016-11-25 DIAGNOSIS — J700 Acute pulmonary manifestations due to radiation: Secondary | ICD-10-CM | POA: Diagnosis present

## 2016-11-25 DIAGNOSIS — F329 Major depressive disorder, single episode, unspecified: Secondary | ICD-10-CM | POA: Diagnosis present

## 2016-11-25 DIAGNOSIS — Z9049 Acquired absence of other specified parts of digestive tract: Secondary | ICD-10-CM

## 2016-11-25 DIAGNOSIS — Y842 Radiological procedure and radiotherapy as the cause of abnormal reaction of the patient, or of later complication, without mention of misadventure at the time of the procedure: Secondary | ICD-10-CM | POA: Diagnosis present

## 2016-11-25 DIAGNOSIS — Z9221 Personal history of antineoplastic chemotherapy: Secondary | ICD-10-CM

## 2016-11-25 DIAGNOSIS — I2699 Other pulmonary embolism without acute cor pulmonale: Secondary | ICD-10-CM | POA: Diagnosis present

## 2016-11-25 DIAGNOSIS — D510 Vitamin B12 deficiency anemia due to intrinsic factor deficiency: Secondary | ICD-10-CM | POA: Diagnosis present

## 2016-11-25 DIAGNOSIS — Z90710 Acquired absence of both cervix and uterus: Secondary | ICD-10-CM

## 2016-11-25 DIAGNOSIS — Z87891 Personal history of nicotine dependence: Secondary | ICD-10-CM | POA: Diagnosis not present

## 2016-11-25 DIAGNOSIS — R0600 Dyspnea, unspecified: Secondary | ICD-10-CM

## 2016-11-25 DIAGNOSIS — K219 Gastro-esophageal reflux disease without esophagitis: Secondary | ICD-10-CM | POA: Diagnosis present

## 2016-11-25 DIAGNOSIS — E039 Hypothyroidism, unspecified: Secondary | ICD-10-CM | POA: Diagnosis present

## 2016-11-25 DIAGNOSIS — Z85118 Personal history of other malignant neoplasm of bronchus and lung: Secondary | ICD-10-CM

## 2016-11-25 DIAGNOSIS — J9801 Acute bronchospasm: Secondary | ICD-10-CM | POA: Diagnosis present

## 2016-11-25 DIAGNOSIS — G4700 Insomnia, unspecified: Secondary | ICD-10-CM | POA: Diagnosis present

## 2016-11-25 DIAGNOSIS — E785 Hyperlipidemia, unspecified: Secondary | ICD-10-CM | POA: Diagnosis present

## 2016-11-25 DIAGNOSIS — Z923 Personal history of irradiation: Secondary | ICD-10-CM | POA: Diagnosis not present

## 2016-11-25 DIAGNOSIS — R0602 Shortness of breath: Secondary | ICD-10-CM | POA: Diagnosis present

## 2016-11-25 DIAGNOSIS — I10 Essential (primary) hypertension: Secondary | ICD-10-CM | POA: Diagnosis present

## 2016-11-25 LAB — COMPREHENSIVE METABOLIC PANEL
ALT: 72 U/L — ABNORMAL HIGH (ref 14–54)
AST: 53 U/L — AB (ref 15–41)
Albumin: 4 g/dL (ref 3.5–5.0)
Alkaline Phosphatase: 71 U/L (ref 38–126)
Anion gap: 11 (ref 5–15)
BUN: 11 mg/dL (ref 6–20)
CHLORIDE: 104 mmol/L (ref 101–111)
CO2: 22 mmol/L (ref 22–32)
Calcium: 9.7 mg/dL (ref 8.9–10.3)
Creatinine, Ser: 0.8 mg/dL (ref 0.44–1.00)
Glucose, Bld: 173 mg/dL — ABNORMAL HIGH (ref 65–99)
POTASSIUM: 3.8 mmol/L (ref 3.5–5.1)
Sodium: 137 mmol/L (ref 135–145)
Total Bilirubin: 0.1 mg/dL — ABNORMAL LOW (ref 0.3–1.2)
Total Protein: 8.6 g/dL — ABNORMAL HIGH (ref 6.5–8.1)

## 2016-11-25 LAB — ECHOCARDIOGRAM COMPLETE
E decel time: 183 msec
E/e' ratio: 9.47
FS: 32 % (ref 28–44)
Height: 64 in
IVS/LV PW RATIO, ED: 0.79
LA diam index: 1.59 cm/m2
LA vol A4C: 25.2 ml
LASIZE: 29 mm
LAVOL: 26.7 mL
LAVOLIN: 14.6 mL/m2
LEFT ATRIUM END SYS DIAM: 29 mm
LV E/e' medial: 9.47
LV e' LATERAL: 7.72 cm/s
LVEEAVG: 9.47
Lateral S' vel: 11.6 cm/s
MV Dec: 183
MV pk E vel: 73.1 m/s
MVPG: 2 mmHg
MVPKAVEL: 108 m/s
PW: 10.8 mm — AB (ref 0.6–1.1)
TDI e' lateral: 7.72
TDI e' medial: 7.62
WEIGHTICAEL: 2560 [oz_av]

## 2016-11-25 LAB — CBC WITH DIFFERENTIAL/PLATELET
BASOS PCT: 0 %
Basophils Absolute: 0 10*3/uL (ref 0–0.1)
EOS ABS: 0 10*3/uL (ref 0–0.7)
Eosinophils Relative: 1 %
HEMATOCRIT: 29.8 % — AB (ref 35.0–47.0)
HEMOGLOBIN: 10.2 g/dL — AB (ref 12.0–16.0)
LYMPHS ABS: 1.3 10*3/uL (ref 1.0–3.6)
Lymphocytes Relative: 19 %
MCH: 31.4 pg (ref 26.0–34.0)
MCHC: 34.1 g/dL (ref 32.0–36.0)
MCV: 92.2 fL (ref 80.0–100.0)
Monocytes Absolute: 0.8 10*3/uL (ref 0.2–0.9)
Monocytes Relative: 11 %
NEUTROS ABS: 5 10*3/uL (ref 1.4–6.5)
NEUTROS PCT: 69 %
Platelets: 235 10*3/uL (ref 150–440)
RBC: 3.24 MIL/uL — AB (ref 3.80–5.20)
RDW: 17 % — ABNORMAL HIGH (ref 11.5–14.5)
WBC: 7.1 10*3/uL (ref 3.6–11.0)

## 2016-11-25 LAB — LACTIC ACID, PLASMA
LACTIC ACID, VENOUS: 1.8 mmol/L (ref 0.5–1.9)
LACTIC ACID, VENOUS: 1.7 mmol/L (ref 0.5–1.9)

## 2016-11-25 LAB — PROTIME-INR
INR: 1.13
PROTHROMBIN TIME: 14.6 s (ref 11.4–15.2)

## 2016-11-25 LAB — FIBRIN DERIVATIVES D-DIMER (ARMC ONLY): Fibrin derivatives D-dimer (ARMC): 4481 — ABNORMAL HIGH (ref 0–499)

## 2016-11-25 MED ORDER — SODIUM CHLORIDE 0.9 % IV BOLUS (SEPSIS)
1000.0000 mL | Freq: Once | INTRAVENOUS | Status: AC
  Administered 2016-11-25: 1000 mL via INTRAVENOUS

## 2016-11-25 MED ORDER — DOCUSATE SODIUM 100 MG PO CAPS
100.0000 mg | ORAL_CAPSULE | Freq: Two times a day (BID) | ORAL | Status: DC
  Filled 2016-11-25 (×5): qty 1

## 2016-11-25 MED ORDER — ALBUTEROL SULFATE (2.5 MG/3ML) 0.083% IN NEBU: 5.0000 mg | INHALATION_SOLUTION | Freq: Once | RESPIRATORY_TRACT | Status: DC

## 2016-11-25 MED ORDER — KETOROLAC TROMETHAMINE 15 MG/ML IJ SOLN: 15.0000 mg | Freq: Four times a day (QID) | INTRAMUSCULAR | Status: DC | PRN

## 2016-11-25 MED ORDER — SIMVASTATIN 20 MG PO TABS
10.0000 mg | ORAL_TABLET | Freq: Every day | ORAL | Status: DC
  Administered 2016-11-25 – 2016-11-27 (×3): 10 mg via ORAL
  Filled 2016-11-25 (×3): qty 1

## 2016-11-25 MED ORDER — APIXABAN 5 MG PO TABS: 5.0000 mg | ORAL_TABLET | Freq: Two times a day (BID) | ORAL | Status: DC

## 2016-11-25 MED ORDER — INFLUENZA VAC SPLIT QUAD 0.5 ML IM SUSY: 0.5000 mL | PREFILLED_SYRINGE | INTRAMUSCULAR | Status: DC

## 2016-11-25 MED ORDER — LEVALBUTEROL HCL 1.25 MG/0.5ML IN NEBU
1.2500 mg | INHALATION_SOLUTION | Freq: Once | RESPIRATORY_TRACT | Status: AC
  Administered 2016-11-25: 1.25 mg via RESPIRATORY_TRACT

## 2016-11-25 MED ORDER — ALBUTEROL SULFATE (2.5 MG/3ML) 0.083% IN NEBU
2.5000 mg | INHALATION_SOLUTION | Freq: Four times a day (QID) | RESPIRATORY_TRACT | Status: DC | PRN
  Filled 2016-11-25: qty 3

## 2016-11-25 MED ORDER — PNEUMOCOCCAL VAC POLYVALENT 25 MCG/0.5ML IJ INJ: 0.5000 mL | INJECTION | INTRAMUSCULAR | Status: DC

## 2016-11-25 MED ORDER — APIXABAN 5 MG PO TABS
10.0000 mg | ORAL_TABLET | Freq: Two times a day (BID) | ORAL | Status: DC
  Administered 2016-11-25 – 2016-11-28 (×6): 10 mg via ORAL
  Filled 2016-11-25 (×6): qty 2

## 2016-11-25 MED ORDER — ONDANSETRON HCL 4 MG PO TABS: 4.0000 mg | ORAL_TABLET | Freq: Four times a day (QID) | ORAL | Status: DC | PRN

## 2016-11-25 MED ORDER — ACETAMINOPHEN 650 MG RE SUPP: 650.0000 mg | Freq: Four times a day (QID) | RECTAL | Status: DC | PRN

## 2016-11-25 MED ORDER — IOPAMIDOL (ISOVUE-370) INJECTION 76%
75.0000 mL | Freq: Once | INTRAVENOUS | Status: AC | PRN
  Administered 2016-11-25: 75 mL via INTRAVENOUS

## 2016-11-25 MED ORDER — ENSURE ENLIVE PO LIQD
237.0000 mL | Freq: Two times a day (BID) | ORAL | Status: DC
  Administered 2016-11-25 – 2016-11-28 (×5): 237 mL via ORAL

## 2016-11-25 MED ORDER — APIXABAN 5 MG PO TABS
10.0000 mg | ORAL_TABLET | Freq: Two times a day (BID) | ORAL | Status: DC
  Administered 2016-11-25: 10 mg via ORAL
  Filled 2016-11-25: qty 2

## 2016-11-25 MED ORDER — TRAMADOL HCL 50 MG PO TABS
50.0000 mg | ORAL_TABLET | Freq: Four times a day (QID) | ORAL | Status: DC | PRN
  Filled 2016-11-25: qty 1

## 2016-11-25 MED ORDER — AMITRIPTYLINE HCL 10 MG PO TABS
20.0000 mg | ORAL_TABLET | Freq: Every day | ORAL | Status: DC
  Administered 2016-11-25 – 2016-11-27 (×3): 20 mg via ORAL
  Filled 2016-11-25 (×4): qty 2

## 2016-11-25 MED ORDER — FAMOTIDINE 20 MG PO TABS
20.0000 mg | ORAL_TABLET | Freq: Two times a day (BID) | ORAL | Status: DC
  Administered 2016-11-25 – 2016-11-27 (×4): 20 mg via ORAL
  Filled 2016-11-25 (×6): qty 1

## 2016-11-25 MED ORDER — LEVOTHYROXINE SODIUM 100 MCG PO TABS
200.0000 ug | ORAL_TABLET | Freq: Every day | ORAL | Status: DC
  Administered 2016-11-26 – 2016-11-28 (×3): 200 ug via ORAL
  Filled 2016-11-25 (×3): qty 2

## 2016-11-25 MED ORDER — ONDANSETRON HCL 4 MG PO TABS: 8.0000 mg | ORAL_TABLET | Freq: Two times a day (BID) | ORAL | Status: DC | PRN

## 2016-11-25 MED ORDER — ACETAMINOPHEN 325 MG PO TABS: 650.0000 mg | ORAL_TABLET | Freq: Four times a day (QID) | ORAL | Status: DC | PRN

## 2016-11-25 MED ORDER — ONDANSETRON HCL 4 MG/2ML IJ SOLN: 4.0000 mg | Freq: Four times a day (QID) | INTRAMUSCULAR | Status: DC | PRN

## 2016-11-25 MED ORDER — LEVALBUTEROL HCL 1.25 MG/0.5ML IN NEBU
INHALATION_SOLUTION | RESPIRATORY_TRACT | Status: AC
  Administered 2016-11-25: 1.25 mg via RESPIRATORY_TRACT
  Filled 2016-11-25: qty 0.5

## 2016-11-25 MED ORDER — ZOLPIDEM TARTRATE 5 MG PO TABS
5.0000 mg | ORAL_TABLET | Freq: Every day | ORAL | Status: DC
  Administered 2016-11-25 – 2016-11-27 (×3): 5 mg via ORAL
  Filled 2016-11-25 (×3): qty 1

## 2016-11-25 NOTE — ED Provider Notes (Signed)
[facility] Emergency Department Provider Note   ____________________________________________   First MD Initiated Contact with Patient 11/25/16 1008     (approximate)  I have reviewed the triage vital signs and the nursing notes.   HISTORY  Chief Complaint Shortness of Breath    HPI VERLIN DUKE is a 60 y.o. female who reports increasing shortness of breath for the last 2-3 days. She has a little bit of mild discomfort in the left lower chest under her breast that where the ribs are. Patient's heart rate has been going on for the last couple days she reports. She is not running a fever. The pain really isn't pleuritic. She's been told she is cancer free. She had lung cancer which was small cell.   Past Medical History:  Diagnosis Date  . Allergy   . Anemia    pernicious  . Anxiety   . Arthritis   . Atrophic vaginitis   . COPD (chronic obstructive pulmonary disease) ([place])   . Depression   . Diverticulosis   . Dyspnea   . Fatigue   . GERD (gastroesophageal reflux disease)   . History of chemotherapy 04/2016  . Hyperlipidemia   . Hypothyroid   . Insomnia   . Low back pain   . Lumbago   . Neuropathy due to chemotherapeutic drug ([place])   . Osteopenia   . Small cell carcinoma of left lung ([place]) 01/2016   rad + chemo tx's  . Tobacco use   . Vitamin B12 deficiency     Patient Active Problem List   Diagnosis Date Noted  . Hyponatremia 11/18/2016  . Hypoxia 11/18/2016  . PNA (pneumonia) 10/31/2016  . Collapse of left lung 09/08/2016  . Hilar mass   . Mass of lower lobe of left lung   . Primary cancer of bronchus of left lower lobe ([place]) 01/04/2016  . Pernicious anemia 09/10/2015  . Hypothyroidism 09/10/2015  . Osteopenia 09/10/2015  . Insomnia 09/10/2015  . Moderate COPD (chronic obstructive pulmonary disease) ([place]) 09/10/2015  . Urge incontinence 09/10/2015  . Chronic pain 09/10/2015  . Depression 09/10/2015  . Atrophic vaginitis  09/10/2015  . Low back pain 09/10/2015  . Fatigue 09/10/2015  . Chronic airway obstruction ([place]) 09/10/2015  . Tobacco abuse 09/10/2015  . Hyperlipidemia 09/10/2015  . Hypertension 09/10/2015  . Vitamin B12 deficiency 09/10/2015  . Severe major depression without psychotic features ([place]) 09/10/2015  . Metabolic syndrome 21/22/4825    Past Surgical History:  Procedure Laterality Date  . ABDOMINAL HYSTERECTOMY    . ANKLE SURGERY     x4  . CARPAL TUNNEL RELEASE  April 2015  . CHOLECYSTECTOMY    . ENDOBRONCHIAL ULTRASOUND N/A 01/07/2016   Procedure: ENDOBRONCHIAL ULTRASOUND;  Surgeon: Vilinda Boehringer, MD;  Location: [facility] ORS;  Service: Cardiopulmonary;  Laterality: N/A;  . ENDOBRONCHIAL ULTRASOUND N/A 09/16/2016   Procedure: ENDOBRONCHIAL ULTRASOUND;  Surgeon: Vilinda Boehringer, MD;  Location: [facility] ORS;  Service: Cardiopulmonary;  Laterality: N/A;  . HERNIA REPAIR    . PORTACATH PLACEMENT Right 01/23/2016   Procedure: INSERTION PORT-A-CATH;  Surgeon: [name], MD;  Location: [facility] ORS;  Service: General;  Laterality: Right;    Prior to Admission medications   Medication Sig Start Date End Date Taking? Authorizing Provider  amitriptyline (ELAVIL) 10 MG tablet Take 2 tablets (20 mg total) by mouth at bedtime. 05/20/16  Yes [name], NP  cyanocobalamin (,VITAMIN B-12,) 1000 MCG/ML injection Inject 1 mL (1,000 mcg total) into the muscle once.  Patient taking differently: Inject 1,000 mcg into the muscle every 30 (thirty) days.  09/10/15  Yes [name], NP  levothyroxine (SYNTHROID, LEVOTHROID) 200 MCG tablet Take 1 tablet (200 mcg total) by mouth daily before breakfast. 11/06/16  Yes Epifanio Lesches, MD  simvastatin (ZOCOR) 10 MG tablet Take 1 tablet (10 mg total) by mouth daily at 6 PM. 04/04/16  Yes [name], NP  zolpidem (AMBIEN) 10 MG tablet Take 10 mg by mouth at bedtime.   Yes Historical Provider, MD  AMBULATORY NON FORMULARY MEDICATION Medication Name: incentive spirometer  09/08/16   Vishal Mungal, MD  dexamethasone (DECADRON) 4 MG tablet Take 1 tablet (4 mg total) by mouth daily. Patient not taking: Reported on 11/18/2016 10/28/16   Lloyd Huger, MD  feeding supplement, ENSURE ENLIVE, (ENSURE ENLIVE) LIQD Take 237 mLs by mouth 2 (two) times daily between meals. 11/05/16   Epifanio Lesches, MD  LORazepam (ATIVAN) 0.5 MG tablet Take 1 tablet (0.5 mg total) by mouth every 8 (eight) hours as needed for anxiety. Patient not taking: Reported on 11/25/2016 08/18/16   Lloyd Huger, MD  megestrol (MEGACE) 400 MG/10ML suspension Take 20 mLs (800 mg total) by mouth daily. Patient not taking: Reported on 11/18/2016 11/05/16   Epifanio Lesches, MD  ondansetron (ZOFRAN) 8 MG tablet Take 1 tablet (8 mg total) by mouth 2 (two) times daily as needed. Start on the third day after cisplatin chemotherapy. 01/22/16   Lloyd Huger, MD  prochlorperazine (COMPAZINE) 10 MG tablet Take 1 tablet (10 mg total) by mouth every 6 (six) hours as needed (Nausea or vomiting). Patient not taking: Reported on 11/25/2016 01/22/16   Lloyd Huger, MD  promethazine (PHENERGAN) 25 MG tablet Take 1 tablet (25 mg total) by mouth every 6 (six) hours as needed for nausea or vomiting. Patient not taking: Reported on 11/25/2016 09/04/16   Lloyd Huger, MD  ranitidine (ZANTAC) 150 MG tablet Take 150 mg by mouth 2 (two) times daily as needed for heartburn.    Historical Provider, MD    Allergies Macrobid [nitrofurantoin]; Skelaxin [metaxalone]; Effexor [venlafaxine]; Hctz [hydrochlorothiazide]; Neurontin [gabapentin]; Paxil [paroxetine hcl]; Pravachol [pravastatin sodium]; Prozac [fluoxetine hcl]; Sulfur; Wellbutrin [bupropion]; and Zoloft [sertraline hcl]  Family History  Problem Relation Age of Onset  . Emphysema Mother   . Diabetes Father     Social History Social History  Substance Use Topics  . Smoking status: Former Smoker    Packs/day: 1.00    Years: 30.00     Types: Cigarettes    Quit date: 01/11/2016  . Smokeless tobacco: Never Used  . Alcohol use No    Review of Systems Constitutional: No fever/chills Eyes: No visual changes. ENT: No sore throat. Cardiovascular: See history of present illness Respiratory: The history of present illness. Gastrointestinal: No abdominal pain.  No nausea, no vomiting.  No diarrhea.  No constipation. Genitourinary: Negative for dysuria. Musculoskeletal: Negative for back pain. Skin: Negative for rash. Neurological: Negative for headaches, focal weakness or numbness.  10-point ROS otherwise negative.  ____________________________________________   PHYSICAL EXAM:  VITAL SIGNS: ED Triage Vitals  Enc Vitals Group     BP 11/25/16 0948 (!) 146/77     Pulse Rate 11/25/16 0948 (!) 134     Resp 11/25/16 0948 (!) 24     Temp 11/25/16 0948 99 F (37.2 C)     Temp Source 11/25/16 0948 Oral     SpO2 11/25/16 0948 95 %     Weight 11/25/16 0949  160 lb (72.6 kg)     Height 11/25/16 0949 '5\' 4"'$  (1.626 m)     Head Circumference --      Peak Flow --      Pain Score 11/25/16 0950 0     Pain Loc --      Pain Edu? --      Excl. in [place]? --    Constitutional: Alert and oriented. Well appearing and in no acute distress. Eyes: Conjunctivae are normal. PERRL. EOMI. Head: Atraumatic. Nose: No congestion/rhinnorhea. Mouth/Throat: Mucous membranes are moist.  Oropharynx non-erythematous. Neck: No stridor.   Cardiovascular: Tachycardia regular rhythm. Grossly normal heart sounds.  Good peripheral circulation. Respiratory: Normal respiratory effort.  No retractions. LungsWheezes throughout Gastrointestinal: Soft and nontender. No distention. No abdominal bruits. No CVA tenderness. Musculoskeletal: No lower extremity tenderness nor edema.  No joint effusions. Neurologic:  Normal speech and language. No gross focal neurologic deficits are appreciated. No gait instability.   ____________________________________________     LABS (all labs ordered are listed, but only abnormal results are displayed)  Labs Reviewed  COMPREHENSIVE METABOLIC PANEL - Abnormal; Notable for the following:       Result Value   Glucose, Bld 173 (*)    Total Protein 8.6 (*)    AST 53 (*)    ALT 72 (*)    Total Bilirubin 0.1 (*)    All other components within normal limits  CBC WITH DIFFERENTIAL/PLATELET - Abnormal; Notable for the following:    RBC 3.24 (*)    Hemoglobin 10.2 (*)    HCT 29.8 (*)    RDW 17.0 (*)    All other components within normal limits  FIBRIN DERIVATIVES D-DIMER ([facility] ONLY) - Abnormal; Notable for the following:    Fibrin derivatives D-dimer ([facility]) 4,481 (*)    All other components within normal limits  CULTURE, BLOOD (ROUTINE X 2)  CULTURE, BLOOD (ROUTINE X 2)  URINE CULTURE  LACTIC ACID, PLASMA  PROTIME-INR  LACTIC ACID, PLASMA  URINALYSIS, COMPLETE (UACMP) WITH MICROSCOPIC   ____________________________________________  EKG  EKG read and interpreted by me shows sinus tachycardia at a rate of 137 normal axis no acute ST-T wave changes ____________________________________________  RADIOLOGY  Study Result   CLINICAL DATA:  Shortness of breath.  EXAM: CHEST  2 VIEW  COMPARISON:  Radiographs of October 31, 2016.  FINDINGS: Stable cardiomediastinal silhouette. Atherosclerosis of thoracic aorta is noted. Right internal jugular Port-A-Cath is stable in position. Right lung is clear but hyperexpanded. No pneumothorax is noted. Stable left perihilar and basilar opacities are noted which may represent edema, pneumonia or scarring.  IMPRESSION: Stable left lung opacities as described above. Aortic atherosclerosis.   Electronically Signed   By: Marijo Conception, M.D.   On: 11/25/2016 10:24    Radiology calls and reports the right lower lobe pulmonary emboli. ____________________________________________   PROCEDURES  Procedure(s) performed:   Procedures  Critical Care  performed:   ____________________________________________   INITIAL IMPRESSION / ASSESSMENT AND PLAN / ED COURSE  Pertinent labs & imaging results that were available during my care of the patient were reviewed by me and considered in my medical decision making (see chart for details).    Clinical Course   Discussed with Dr. Posey Pronto she wants to start on L [place] ____________________________________________   FINAL CLINICAL IMPRESSION(S) / ED DIAGNOSES  Final diagnoses:  Other acute pulmonary embolism without acute cor pulmonale (HCC)      NEW MEDICATIONS STARTED DURING THIS VISIT:  New Prescriptions  No medications on file     Note:  This document was prepared using Dragon voice recognition software and may include unintentional dictation errors.    Nena Polio, MD 11/25/16 [phone]

## 2016-11-25 NOTE — ED Notes (Signed)
ED Provider at bedside. 

## 2016-11-25 NOTE — H&P (Signed)
[facility] [place] NAME: Jill Gross    MR#:  [id number]  DATE OF BIRTH:  [birth year]  DATE OF ADMISSION:  11/25/2016  PRIMARY CARE PHYSICIAN: [name], NP   REQUESTING/REFERRING PHYSICIAN: Dr. Rip Harbour  CHIEF COMPLAINT:   Increasing shortness of breath more than usual for 2 days  HISTORY OF PRESENT ILLNESS:  Jill Gross  is a 60 y.o. 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 She was found with very tachycardic with heart rate in the 120s and 130s. Given her history of small cell lung cancer she underwent CT of the chest which shows right lower lobe PE. She is being admitted for further evaluation management. She was started on oral anticoagulation for treatment of PE. Since patient was tachycardic she is being admitted for further evaluation and management. Patient denies any recent travel any recent surgery. Hurst risk factor is history of small cell lung cancer.  PAST MEDICAL HISTORY:   Past Medical History:  Diagnosis Date  . Allergy   . Anemia    pernicious  . Anxiety   . Arthritis   . Atrophic vaginitis   . COPD (chronic obstructive pulmonary disease) ([place])   . Depression   . Diverticulosis   . Dyspnea   . Fatigue   . GERD (gastroesophageal reflux disease)   . History of chemotherapy 04/2016  . Hyperlipidemia   . Hypothyroid   . Insomnia   . Low back pain   . Lumbago   . Neuropathy due to chemotherapeutic drug ([place])   . Osteopenia   . Small cell carcinoma of left lung ([place]) 01/2016   rad + chemo tx's  . Tobacco use   . Vitamin B12 deficiency     PAST SURGICAL HISTOIRY:   Past Surgical History:  Procedure Laterality Date  . ABDOMINAL HYSTERECTOMY    . ANKLE SURGERY     x4  . CARPAL  TUNNEL RELEASE  April 2015  . CHOLECYSTECTOMY    . ENDOBRONCHIAL ULTRASOUND N/A 01/07/2016   Procedure: ENDOBRONCHIAL ULTRASOUND;  Surgeon: Vilinda Boehringer, MD;  Location: [facility] ORS;  Service: Cardiopulmonary;  Laterality: N/A;  . ENDOBRONCHIAL ULTRASOUND N/A 09/16/2016   Procedure: ENDOBRONCHIAL ULTRASOUND;  Surgeon: Vilinda Boehringer, MD;  Location: [facility] ORS;  Service: Cardiopulmonary;  Laterality: N/A;  . HERNIA REPAIR    . PORTACATH PLACEMENT Right 01/23/2016   Procedure: INSERTION PORT-A-CATH;  Surgeon: [name], MD;  Location: [facility] ORS;  Service: General;  Laterality: Right;    SOCIAL HISTORY:   Social History  Substance Use Topics  . Smoking status: Former Smoker    Packs/day: 1.00    Years: 30.00    Types: Cigarettes    Quit date: 01/11/2016  . Smokeless tobacco: Never Used  . Alcohol use No    FAMILY HISTORY:   Family History  Problem Relation Age of Onset  . Emphysema Mother   . Diabetes Father     DRUG ALLERGIES:   Allergies  Allergen Reactions  . Macrobid [Nitrofurantoin] Anaphylaxis  . Skelaxin [Metaxalone] Anaphylaxis  . Effexor [Venlafaxine]   . Hctz [Hydrochlorothiazide] Other (See Comments)    cramps  . Neurontin [Gabapentin]   . Paxil [Paroxetine Hcl]   . Pravachol [Pravastatin Sodium] Other (See Comments)    aching  .  Prozac [Fluoxetine Hcl]   . Sulfur Other (See Comments)    "blisters in mouth"  . Wellbutrin [Bupropion] Itching  . Zoloft [Sertraline Hcl]     REVIEW OF SYSTEMS:  Review of Systems  Constitutional: Negative for chills, fever and weight loss.  HENT: Negative for ear discharge, ear pain and nosebleeds.   Eyes: Negative for blurred vision, pain and discharge.  Respiratory: Positive for shortness of breath. Negative for sputum production, wheezing and stridor.   Cardiovascular: Negative for chest pain, palpitations, orthopnea and PND.  Gastrointestinal: Negative for abdominal pain, diarrhea, nausea and vomiting.  Genitourinary:  Negative for frequency and urgency.  Musculoskeletal: Negative for back pain and joint pain.  Neurological: Negative for sensory change, speech change, focal weakness and weakness.  Psychiatric/Behavioral: Negative for depression and hallucinations. The patient is not nervous/anxious.      MEDICATIONS AT HOME:   Prior to Admission medications   Medication Sig Start Date End Date Taking? Authorizing Provider  amitriptyline (ELAVIL) 10 MG tablet Take 2 tablets (20 mg total) by mouth at bedtime. 05/20/16  Yes [name], NP  cyanocobalamin (,VITAMIN B-12,) 1000 MCG/ML injection Inject 1 mL (1,000 mcg total) into the muscle once. Patient taking differently: Inject 1,000 mcg into the muscle every 30 (thirty) days.  09/10/15  Yes [name], NP  levothyroxine (SYNTHROID, LEVOTHROID) 200 MCG tablet Take 1 tablet (200 mcg total) by mouth daily before breakfast. 11/06/16  Yes Epifanio Lesches, MD  simvastatin (ZOCOR) 10 MG tablet Take 1 tablet (10 mg total) by mouth daily at 6 PM. 04/04/16  Yes [name], NP  zolpidem (AMBIEN) 10 MG tablet Take 10 mg by mouth at bedtime.   Yes Historical Provider, MD  AMBULATORY NON FORMULARY MEDICATION Medication Name: incentive spirometer 09/08/16   Vilinda Boehringer, MD  feeding supplement, ENSURE ENLIVE, (ENSURE ENLIVE) LIQD Take 237 mLs by mouth 2 (two) times daily between meals. 11/05/16   Epifanio Lesches, MD  ondansetron (ZOFRAN) 8 MG tablet Take 1 tablet (8 mg total) by mouth 2 (two) times daily as needed. Start on the third day after cisplatin chemotherapy. 01/22/16   Lloyd Huger, MD  ranitidine (ZANTAC) 150 MG tablet Take 150 mg by mouth 2 (two) times daily as needed for heartburn.    Historical Provider, MD      VITAL SIGNS:  Blood pressure (!) 150/60, pulse (!) 129, temperature 98.6 F (37 C), temperature source Oral, resp. rate 18, height '5\' 4"'$  (1.626 m), weight 72.6 kg (160 lb), SpO2 96 %.  PHYSICAL EXAMINATION:  GENERAL:  60  y.o.-year-old patient lying in the bed with no acute distress.Appears chronically ill  EYES: Pupils equal, round, reactive to light and accommodation. No scleral icterus. Extraocular muscles intact.  HEENT: Head atraumatic, normocephalic. Oropharynx and nasopharynx clear.  NECK:  Supple, no jugular venous distention. No thyroid enlargement, no tenderness.  LUNGS: Coarse breath sounds bilaterally, no wheezing, rales,rhonchi or crepitation. No use of accessory muscles of respiration.  CARDIOVASCULAR: S1, S2 normal. No murmurs, rubs, or gallops. Tachycardia ABDOMEN: Soft, nontender, nondistended. Bowel sounds present. No organomegaly or mass.  EXTREMITIES: No pedal edema, cyanosis, or clubbing.  NEUROLOGIC: Cranial nerves II through XII are intact. Muscle strength 5/5 in all extremities. Sensation intact. Gait not checked.  PSYCHIATRIC: The patient is alert and oriented x 3.  SKIN: No obvious rash, lesion, or ulcer.   LABORATORY PANEL:   CBC  Recent Labs Lab 11/25/16 0952  WBC 7.1  HGB 10.2*  HCT 29.8*  PLT  235   ------------------------------------------------------------------------------------------------------------------  Chemistries   Recent Labs Lab 11/25/16 0952  NA 137  K 3.8  CL 104  CO2 22  GLUCOSE 173*  BUN 11  CREATININE 0.80  CALCIUM 9.7  AST 53*  ALT 72*  ALKPHOS 71  BILITOT 0.1*   ------------------------------------------------------------------------------------------------------------------  Cardiac Enzymes No results for input(s): TROPONINI in the last 168 hours. ------------------------------------------------------------------------------------------------------------------  RADIOLOGY:  Dg Chest 2 View  Result Date: 11/25/2016 CLINICAL DATA:  Shortness of breath. EXAM: CHEST  2 VIEW COMPARISON:  Radiographs of October 31, 2016. FINDINGS: Stable cardiomediastinal silhouette. Atherosclerosis of thoracic aorta is noted. Right internal jugular  Port-A-Cath is stable in position. Right lung is clear but hyperexpanded. No pneumothorax is noted. Stable left perihilar and basilar opacities are noted which may represent edema, pneumonia or scarring. IMPRESSION: Stable left lung opacities as described above. Aortic atherosclerosis. Electronically Signed   By: Marijo Conception, M.D.   On: 11/25/2016 10:24   Ct Angio Chest Pe W And/or Wo Contrast  Addendum Date: 11/25/2016   ADDENDUM REPORT: 11/25/2016 11:59 ADDENDUM: The original report was by Dr. Van Clines. The following addendum is by Dr. Van Clines: These results were called by telephone at the time of interpretation on 11/25/2016 at 11:59 am to Dr. Conni Slipper , who verbally acknowledged these results. Electronically Signed   By: Van Clines M.D.   On: 11/25/2016 11:59   Result Date: 11/25/2016 CLINICAL DATA:  Shortness of breath. Cough and wheezing for 2 days. Small cell carcinoma of the left lung, reportedly in remission. EXAM: CT ANGIOGRAPHY CHEST WITH CONTRAST TECHNIQUE: Multidetector CT imaging of the chest was performed using the standard protocol during bolus administration of intravenous contrast. Multiplanar CT image reconstructions and MIPs were obtained to evaluate the vascular anatomy. CONTRAST:  75 cc Isovue 370 COMPARISON:  Multiple exams, including 08/29/2016 FINDINGS: Cardiovascular: Acute pulmonary embolus in the right lower lobe pulmonary artery and segmental branches. Overall clot burden is small to moderate. Right ventricular to left ventricular ratio is 0.75. Aortic and branch vessel atherosclerotic calcification noted. Small pericardial effusion similar to prior. Mediastinum/Nodes: No pathologic mediastinal or definite hilar adenopathy identified. Lungs/Pleura: Reduced size of the left pleural effusion, currently small, with bilateral patchy ground-glass opacities in both lungs, some increase bandlike airspace opacity in the left upper lobe, and some mild  airspace opacity in the lingula. There is a combination of atelectasis and airspace filling process in the left lower lobe but the left lower lobe aeration is better than previous. There is narrowing of the left lower lobe bronchus on image 46/7 which could be inflammatory or due to tumor, but the bronchus is not completely occluded as it was on 08/29/2016. Biapical pleuroparenchymal scarring. Upper Abdomen: Postoperative findings from prior cholecystectomy. Musculoskeletal: Mild thoracic spondylosis. Review of the MIP images confirms the above findings. IMPRESSION: 1. Acute pulmonary embolus in the right lower lobe pulmonary artery extending into segmental vessels. Overall clot burden is low-to-moderate. Right ventricular to left ventricular ratio is normal, without compelling signs of right heart strain at this time. 2. Patchy ground-glass opacities in both lungs possibly from low-level edema. 3. Improved aeration in the left lower lobe although there is still considerable atelectasis and airspace opacity. There is also some bandlike opacity in the left upper lobe which is increased. Much of this may be due to radiation therapy. Previously occluded left lower lobe bronchus is now severely stenotic rather than occluded, as on image 46/7, this could be from inflammation or  tumor. 4. Small left pleural effusion and small pericardial effusion, both similar to prior. 5. Aortic and branch vessel atherosclerosis. Radiology assistant personnel have been notified to put me in telephone contact with the referring physician or the referring physician's clinical representative in order to discuss these findings. Once this communication is established I will issue an addendum to this report for documentation purposes. Electronically Signed: By: Van Clines M.D. On: 11/25/2016 11:57    EKG:   Sinus tachycardia IMPRESSION AND PLAN:   Jaylene Arrowood  is a 60 y.o. female with a known history of small cell lung cancer  status post chemotherapy and radiation therapy, chronic anxiety, chronic COPD on 2 L nasal cannula oxygen comes to the emergency room with increasing shortness of breath more than her usual breathing pattern. She was found with very tachycardic with heart rate in the 120s and 130s. Given her history of small cell lung cancer she underwent CT of the chest which shows right lower lobe PE. She is being admitted for further evaluation management.  1. Acute right lower lobe PE -Patient hemodynamically stable other than tachycardia -Admit to medical floor -Continue oral anticoagulation with eliquis. Pharmacy to dose -Echo of the heart -When necessary meds -Incentive spirometer  2. History of small cell lung cancer -Finished chemotherapy and radiation -Spoke with Dr. [name]. Okay to follow up as outpatient  3. COPD chronic -On 2 L nasal cannula oxygen  4. Hyperlipidemia continue statins  5. Hypothyroidism continue Synthroid  6. DVT prophylaxis patient already on anticoagulation for PE  Above was discussed with patient's family in the ER.  All the records are reviewed and case discussed with ED provider. Management plans discussed with the patient, family and they are in agreement.  CODE STATUS: Full  TOTAL TIME TAKING CARE OF THIS PATIENT: 50 minutes.    Canisha Issac M.D on 11/25/2016 at 4:37 PM  Between 7am to 6pm - Pager - [phone]  After 6pm go to [email or web address] - password [email or web address] [facility]  Office  [phone]  CC: Primary care physician; [name], NP

## 2016-11-25 NOTE — ED Triage Notes (Signed)
Patient presents to the ED via EMS from home.  Patient reports shortness of breath, increased coughing and wheezing.  Patient has history of small cell carcinoma but reports being cancer free for the past few months.  Patient states she hasn't been in chemo or radiation in the past few months.  Patient uses 2L of oxygen by [place] at home.

## 2016-11-26 MED ORDER — BUDESONIDE 0.5 MG/2ML IN SUSP
0.5000 mg | Freq: Two times a day (BID) | RESPIRATORY_TRACT | Status: DC
  Administered 2016-11-26 – 2016-11-28 (×4): 0.5 mg via RESPIRATORY_TRACT
  Filled 2016-11-26 (×5): qty 2

## 2016-11-26 MED ORDER — SODIUM CHLORIDE 0.9 % IV BOLUS (SEPSIS)
500.0000 mL | Freq: Once | INTRAVENOUS | Status: AC
  Administered 2016-11-26: 500 mL via INTRAVENOUS

## 2016-11-26 MED ORDER — BENZONATATE 100 MG PO CAPS
200.0000 mg | ORAL_CAPSULE | Freq: Three times a day (TID) | ORAL | Status: DC | PRN
  Administered 2016-11-26 – 2016-11-27 (×3): 200 mg via ORAL
  Filled 2016-11-26 (×3): qty 2

## 2016-11-26 MED ORDER — LEVALBUTEROL HCL 1.25 MG/0.5ML IN NEBU
1.2500 mg | INHALATION_SOLUTION | Freq: Four times a day (QID) | RESPIRATORY_TRACT | Status: DC | PRN
  Administered 2016-11-26 – 2016-11-27 (×3): 1.25 mg via RESPIRATORY_TRACT
  Filled 2016-11-26 (×3): qty 0.5

## 2016-11-26 MED ORDER — DEXTROMETHORPHAN POLISTIREX ER 30 MG/5ML PO SUER
30.0000 mg | Freq: Two times a day (BID) | ORAL | Status: DC
  Administered 2016-11-26 – 2016-11-28 (×5): 30 mg via ORAL
  Filled 2016-11-26 (×6): qty 5

## 2016-11-26 MED ORDER — METHYLPREDNISOLONE SODIUM SUCC 40 MG IJ SOLR
40.0000 mg | Freq: Three times a day (TID) | INTRAMUSCULAR | Status: DC
  Administered 2016-11-26 – 2016-11-28 (×6): 40 mg via INTRAVENOUS
  Filled 2016-11-26 (×6): qty 1

## 2016-11-26 NOTE — Progress Notes (Signed)
[facility] [place] NAME: Jill Gross    MR#:  [id number]  DATE OF BIRTH:  13-Jan-1956  SUBJECTIVE:   Pt. Here due to shortness of breath and noted to have an Acute Pulmonary Embolus.  Family is at bedside. Still having some mild wheezing and bronchospasm. Noted to have sinus tachycardia on telemetry.  REVIEW OF SYSTEMS:    Review of Systems  Constitutional: Negative for chills and fever.  HENT: Negative for congestion and tinnitus.   Eyes: Negative for blurred vision and double vision.  Respiratory: Positive for wheezing. Negative for cough and shortness of breath.   Cardiovascular: Negative for chest pain, orthopnea and PND.  Gastrointestinal: Negative for abdominal pain, diarrhea, nausea and vomiting.  Genitourinary: Negative for dysuria and hematuria.  Neurological: Negative for dizziness, sensory change and focal weakness.  All other systems reviewed and are negative.   Nutrition: Soft diet Tolerating Diet: Yes Tolerating PT: Eval noted.    DRUG ALLERGIES:   Allergies  Allergen Reactions  . Macrobid [Nitrofurantoin] Anaphylaxis  . Skelaxin [Metaxalone] Anaphylaxis  . Effexor [Venlafaxine]   . Hctz [Hydrochlorothiazide] Other (See Comments)    cramps  . Neurontin [Gabapentin]   . Paxil [Paroxetine Hcl]   . Pravachol [Pravastatin Sodium] Other (See Comments)    aching  . Prozac [Fluoxetine Hcl]   . Sulfur Other (See Comments)    "blisters in mouth"  . Wellbutrin [Bupropion] Itching  . Zoloft [Sertraline Hcl]     VITALS:  Blood pressure (!) 126/58, pulse (!) 119, temperature 98.2 F (36.8 C), temperature source Oral, resp. rate 20, height '5\' 4"'$  (1.626 m), weight 72.6 kg (160 lb), SpO2 94 %.  PHYSICAL EXAMINATION:   Physical Exam  GENERAL:  60 y.o.-year-old patient lying in bed but in NAD.  EYES: Pupils equal, round, reactive to light and accommodation. No scleral icterus. Extraocular muscles intact.  HEENT: Head  atraumatic, normocephalic. Oropharynx and nasopharynx clear.  NECK:  Supple, no jugular venous distention. No thyroid enlargement, no tenderness.  LUNGS: Good a/e b/l, b/l end-exp. Wheezing, No rales, rhonchi. No use of accessory muscles of respiration.  CARDIOVASCULAR: S1, S2 normal. Tachy No murmurs, rubs, or gallops.  ABDOMEN: Soft, nontender, nondistended. Bowel sounds present. No organomegaly or mass.  EXTREMITIES: No cyanosis, clubbing or edema b/l.    NEUROLOGIC: Cranial nerves II through XII are intact. No focal Motor or sensory deficits b/l. Globally weak.  PSYCHIATRIC: The patient is alert and oriented x 3.  SKIN: No obvious rash, lesion, or ulcer.    LABORATORY PANEL:   CBC  Recent Labs Lab 11/25/16 0952  WBC 7.1  HGB 10.2*  HCT 29.8*  PLT 235   ------------------------------------------------------------------------------------------------------------------  Chemistries   Recent Labs Lab 11/25/16 0952  NA 137  K 3.8  CL 104  CO2 22  GLUCOSE 173*  BUN 11  CREATININE 0.80  CALCIUM 9.7  AST 53*  ALT 72*  ALKPHOS 71  BILITOT 0.1*   ------------------------------------------------------------------------------------------------------------------  Cardiac Enzymes No results for input(s): TROPONINI in the last 168 hours. ------------------------------------------------------------------------------------------------------------------  RADIOLOGY:  Dg Chest 2 View  Result Date: 11/25/2016 CLINICAL DATA:  Shortness of breath. EXAM: CHEST  2 VIEW COMPARISON:  Radiographs of October 31, 2016. FINDINGS: Stable cardiomediastinal silhouette. Atherosclerosis of thoracic aorta is noted. Right internal jugular Port-A-Cath is stable in position. Right lung is clear but hyperexpanded. No pneumothorax is noted. Stable left perihilar and basilar opacities are noted which may represent edema, pneumonia or scarring.  IMPRESSION: Stable left lung opacities as described above.  Aortic atherosclerosis. Electronically Signed   By: Marijo Conception, M.D.   On: 11/25/2016 10:24   Ct Angio Chest Pe W And/or Wo Contrast  Addendum Date: 11/25/2016   ADDENDUM REPORT: 11/25/2016 11:59 ADDENDUM: The original report was by Dr. Van Clines. The following addendum is by Dr. Van Clines: These results were called by telephone at the time of interpretation on 11/25/2016 at 11:59 am to Dr. Conni Slipper , who verbally acknowledged these results. Electronically Signed   By: Van Clines M.D.   On: 11/25/2016 11:59   Result Date: 11/25/2016 CLINICAL DATA:  Shortness of breath. Cough and wheezing for 2 days. Small cell carcinoma of the left lung, reportedly in remission. EXAM: CT ANGIOGRAPHY CHEST WITH CONTRAST TECHNIQUE: Multidetector CT imaging of the chest was performed using the standard protocol during bolus administration of intravenous contrast. Multiplanar CT image reconstructions and MIPs were obtained to evaluate the vascular anatomy. CONTRAST:  75 cc Isovue 370 COMPARISON:  Multiple exams, including 08/29/2016 FINDINGS: Cardiovascular: Acute pulmonary embolus in the right lower lobe pulmonary artery and segmental branches. Overall clot burden is small to moderate. Right ventricular to left ventricular ratio is 0.75. Aortic and branch vessel atherosclerotic calcification noted. Small pericardial effusion similar to prior. Mediastinum/Nodes: No pathologic mediastinal or definite hilar adenopathy identified. Lungs/Pleura: Reduced size of the left pleural effusion, currently small, with bilateral patchy ground-glass opacities in both lungs, some increase bandlike airspace opacity in the left upper lobe, and some mild airspace opacity in the lingula. There is a combination of atelectasis and airspace filling process in the left lower lobe but the left lower lobe aeration is better than previous. There is narrowing of the left lower lobe bronchus on image 46/7 which could be  inflammatory or due to tumor, but the bronchus is not completely occluded as it was on 08/29/2016. Biapical pleuroparenchymal scarring. Upper Abdomen: Postoperative findings from prior cholecystectomy. Musculoskeletal: Mild thoracic spondylosis. Review of the MIP images confirms the above findings. IMPRESSION: 1. Acute pulmonary embolus in the right lower lobe pulmonary artery extending into segmental vessels. Overall clot burden is low-to-moderate. Right ventricular to left ventricular ratio is normal, without compelling signs of right heart strain at this time. 2. Patchy ground-glass opacities in both lungs possibly from low-level edema. 3. Improved aeration in the left lower lobe although there is still considerable atelectasis and airspace opacity. There is also some bandlike opacity in the left upper lobe which is increased. Much of this may be due to radiation therapy. Previously occluded left lower lobe bronchus is now severely stenotic rather than occluded, as on image 46/7, this could be from inflammation or tumor. 4. Small left pleural effusion and small pericardial effusion, both similar to prior. 5. Aortic and branch vessel atherosclerosis. Radiology assistant personnel have been notified to put me in telephone contact with the referring physician or the referring physician's clinical representative in order to discuss these findings. Once this communication is established I will issue an addendum to this report for documentation purposes. Electronically Signed: By: Van Clines M.D. On: 11/25/2016 11:57     ASSESSMENT AND PLAN:   60 year old female with past medical history of lung cancer, COPD,hypothyroidism, hypertension, hyperlipidemia, depression, who presents to the hospital due to shortness of breath and noted to have an acute pulmonary embolism.  1. acute pulmonary embolism-patient is hemodynamically stable other than some sinus tachycardia. -Continue Eliquis, O2 supplementation  and will monitor.   2.  COPD - having some wheezing bronchospasm. CT chest showing changes consistent with radiation pneumonitis.  - no pneumonia noted.  Started on IV steroids, Xopenex nebs, Pulmicort nebs.   3. Tachycardia - sinus tachycardia due to underlying PE.  - will monitor.   4. Hypothyroidism - cont. Synthroid.   5. Hx of Lung Cancer - currently in remission.  - follows w/ Dr. [name].   6.  Hyperlipidemia - cont. Simvastatin  7. GERD - cont. pepcid   All the records are reviewed and case discussed with Care Management/Social Worker. Management plans discussed with the patient, family and they are in agreement.  CODE STATUS: Full code  DVT Prophylaxis: Eliquis  TOTAL TIME TAKING CARE OF THIS PATIENT: 30 minutes.   POSSIBLE D/C IN 1-2 DAYS, DEPENDING ON CLINICAL CONDITION.   [name] M.D on 11/26/2016 at 3:57 PM  Between 7am to 6pm - Pager - [phone]  After 6pm go to [email or web address] - [employer]  [facility]  Office  [phone]  CC: Primary care physician; [name], NP

## 2016-11-26 NOTE — Care Management (Addendum)
Patient is admitted from home with shortness of breath and tachycardia.  Found to have right lower lobe pulmonary embolus.  She was started on Eliquis and anticipate discharge home on the same.  contacted [facility] - Patient's pharmacy carrier.  medication will not require prior auth.  One month copay is 191.60.  Provided patient with 30 day trial coupon and a co pay assist card.   she is currently followed by [facility]. Spoke with agency and will notify this CM of current services.  Will obtain order to resume services. Patient has chronic 02 through [facility]. Patient is going to require a home nebulizer machine.  Called referral to [facility].  Anticipate discharge 12.21.2017

## 2016-11-26 NOTE — Progress Notes (Signed)
Initial Nutrition Assessment  DOCUMENTATION CODES:   Not applicable  INTERVENTION:  1. Ensure Enlive po BID, each supplement provides 350 kcal and 20 grams of protein  NUTRITION DIAGNOSIS:   Inadequate oral intake related to poor appetite as evidenced by per patient/family report.  GOAL:   Patient will meet greater than or equal to 90% of their needs  MONITOR:   PO intake, I & O's, Labs, Weight trends, Supplement acceptance  REASON FOR ASSESSMENT:   Malnutrition Screening Tool    ASSESSMENT:   Jill Gross  is a 60 y.o. 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 Vanwagner, Husband at bedside. She reports "not eating as well as I should have been," at home. Normally eats eggs for breakfast - then doesn't eat again until supper - eats "whatever he cooks" or they go out to eat. Also consumes ensure at home. Patient is likely not meeting her needs - complains of poor appetite. She complains of 8# wt loss over the past month - but per chart, her weight was relatively stable. Attempted to order her lunch during my visit but no one answered when I called. Documented meal completion thus far 25%  Nutrition-Focused physical exam completed. Findings are no fat depletion, moderate muscle depletion, and no edema.   Labs and medications reviewed: Colace, Solumedrol   Diet Order:  DIET SOFT Room service appropriate? Yes; Fluid consistency: Thin  Skin:  Reviewed, no issues  Last BM:  12/19  Height:   Ht Readings from Last 1 Encounters:  11/25/16 '5\' 4"'$  (1.626 m)    Weight:   Wt Readings from Last 1 Encounters:  11/25/16 160 lb (72.6 kg)    Ideal Body Weight:  54.54 kg  BMI:  Body mass index is 27.46 kg/m.  Estimated Nutritional Needs:    Kcal:  1850-2150 calories  Protein:  86-110 gm  Fluid:  >/= 2.2L  EDUCATION NEEDS:   No education needs identified at this time  Satira Anis. Ronnette Rump, MS, RD LDN Inpatient Clinical Dietitian Pager [phone]

## 2016-11-27 MED ORDER — ALPRAZOLAM 0.25 MG PO TABS: 0.2500 mg | ORAL_TABLET | Freq: Three times a day (TID) | ORAL | Status: DC | PRN

## 2016-11-27 MED ORDER — MORPHINE SULFATE (CONCENTRATE) 10 MG/0.5ML PO SOLN
10.0000 mg | ORAL | Status: DC | PRN
  Administered 2016-11-27 – 2016-11-28 (×5): 10 mg via ORAL
  Filled 2016-11-27 (×5): qty 1

## 2016-11-27 NOTE — Progress Notes (Signed)
[place] at [place] NAME: Jill Gross    MR#:  [id number]  DATE OF BIRTH:  11-21-1956  SUBJECTIVE:   Pt. Here due to shortness of breath and noted to have an Acute Pulmonary Embolus. A bit anxious this a.m. And tearful at times.  Still having some wheezing, bronchospasm.   REVIEW OF SYSTEMS:    Review of Systems  Constitutional: Negative for chills and fever.  HENT: Negative for congestion and tinnitus.   Eyes: Negative for blurred vision and double vision.  Respiratory: Positive for wheezing. Negative for cough and shortness of breath.   Cardiovascular: Negative for chest pain, orthopnea and PND.  Gastrointestinal: Negative for abdominal pain, diarrhea, nausea and vomiting.  Genitourinary: Negative for dysuria and hematuria.  Neurological: Negative for dizziness, sensory change and focal weakness.  All other systems reviewed and are negative.   Nutrition: Soft diet Tolerating Diet: Yes Tolerating PT: Eval noted.    DRUG ALLERGIES:   Allergies  Allergen Reactions  . Macrobid [Nitrofurantoin] Anaphylaxis  . Skelaxin [Metaxalone] Anaphylaxis  . Effexor [Venlafaxine]   . Hctz [Hydrochlorothiazide] Other (See Comments)    cramps  . Neurontin [Gabapentin]   . Paxil [Paroxetine Hcl]   . Pravachol [Pravastatin Sodium] Other (See Comments)    aching  . Prozac [Fluoxetine Hcl]   . Sulfur Other (See Comments)    "blisters in mouth"  . Wellbutrin [Bupropion] Itching  . Zoloft [Sertraline Hcl]     VITALS:  Blood pressure 126/64, pulse (!) 114, temperature 97.8 F (36.6 C), temperature source Oral, resp. rate 18, height '5\' 4"'$  (1.626 m), weight 72.6 kg (160 lb), SpO2 96 %.  PHYSICAL EXAMINATION:   Physical Exam  GENERAL:  60 y.o.-year-old patient lying in bed a bit anxious/nervous EYES: Pupils equal, round, reactive to light and accommodation. No scleral icterus. Extraocular muscles intact.  HEENT: Head atraumatic, normocephalic.  Oropharynx and nasopharynx clear.  NECK:  Supple, no jugular venous distention. No thyroid enlargement, no tenderness.  LUNGS: Good a/e b/l, b/l end-exp. Wheezing, No rales, rhonchi. No use of accessory muscles of respiration.  CARDIOVASCULAR: S1, S2 normal. Tachy No murmurs, rubs, or gallops.  ABDOMEN: Soft, nontender, nondistended. Bowel sounds present. No organomegaly or mass.  EXTREMITIES: No cyanosis, clubbing or edema b/l.    NEUROLOGIC: Cranial nerves II through XII are intact. No focal Motor or sensory deficits b/l. Globally weak.  PSYCHIATRIC: The patient is alert and oriented x 3.  SKIN: No obvious rash, lesion, or ulcer.    LABORATORY PANEL:   CBC  Recent Labs Lab 11/25/16 0952  WBC 7.1  HGB 10.2*  HCT 29.8*  PLT 235   ------------------------------------------------------------------------------------------------------------------  Chemistries   Recent Labs Lab 11/25/16 0952  NA 137  K 3.8  CL 104  CO2 22  GLUCOSE 173*  BUN 11  CREATININE 0.80  CALCIUM 9.7  AST 53*  ALT 72*  ALKPHOS 71  BILITOT 0.1*   ------------------------------------------------------------------------------------------------------------------  Cardiac Enzymes No results for input(s): TROPONINI in the last 168 hours. ------------------------------------------------------------------------------------------------------------------  RADIOLOGY:  No results found.   ASSESSMENT AND PLAN:   60 year old female with past medical history of lung cancer, COPD,hypothyroidism, hypertension, hyperlipidemia, depression, who presents to the hospital due to shortness of breath and noted to have an acute pulmonary embolism.  1. acute pulmonary embolism-patient is hemodynamically stable other than some sinus tachycardia. -Continue Eliquis, O2 supplementation and will monitor. - stable.   2. COPD - still having some wheezing bronchospasm. 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  - no pneumonia noted.  Cont. IV steroids, Xopenex, Pulmicort nebs.  - given some Roxanol, Ativan for anxiety, air hunger   3. Tachycardia - sinus tachycardia due to underlying PE.  - will monitor.   4. Hypothyroidism - cont. Synthroid.   5. Hx of Lung Cancer - currently in remission.  - follows w/ Dr. [name].   6.  Hyperlipidemia - cont. Simvastatin  7. GERD - cont. pepcid  Possible d/c home tomorrow.   All the records are reviewed and case discussed with Care Management/Social Worker. Management plans discussed with the patient, family and they are in agreement.  CODE STATUS: Full code  DVT Prophylaxis: Eliquis  TOTAL TIME TAKING CARE OF THIS PATIENT: 30 minutes.   POSSIBLE D/C IN 1-2 DAYS, DEPENDING ON CLINICAL CONDITION.   [name] M.D on 11/27/2016 at 3:58 PM  Between 7am to 6pm - Pager - [phone]  After 6pm go to [email or web address] - Technical brewer [place]  Office  [phone]  CC: Primary care physician; [name], NP

## 2016-11-28 MED ORDER — IPRATROPIUM-ALBUTEROL 0.5-2.5 (3) MG/3ML IN SOLN
3.0000 mL | Freq: Four times a day (QID) | RESPIRATORY_TRACT | 0 refills | Status: AC | PRN
Start: 2016-11-28 — End: ?

## 2016-11-28 MED ORDER — MORPHINE SULFATE (CONCENTRATE) 10 MG/0.5ML PO SOLN: 10.0000 mg | ORAL | 0 refills | Status: DC | PRN

## 2016-11-28 MED ORDER — APIXABAN 5 MG PO TABS: 5.0000 mg | ORAL_TABLET | Freq: Two times a day (BID) | ORAL | 1 refills | Status: DC

## 2016-11-28 MED ORDER — PREDNISONE 10 MG PO TABS: ORAL_TABLET | ORAL | 0 refills | Status: DC

## 2016-11-28 MED ORDER — APIXABAN 5 MG PO TABS: 10.0000 mg | ORAL_TABLET | Freq: Two times a day (BID) | ORAL | 0 refills | Status: DC

## 2016-11-28 NOTE — Care Management Note (Signed)
Case Management Note  Patient Details  Name: Jill Gross MRN: [id number] Date of Birth: [birth year]  Subjective/Objective:    Amy at [facility] is applying to [facility] for [place] to provide [place] services for [name]. Amy reports that she may not receive approval prior to the end of today.                  Action/Plan:   Expected Discharge Date:  11/28/16               Expected Discharge Plan:  [place]  In-House Referral:     Discharge planning Services  CM Consult  Post Acute Care Choice:  Durable Medical Equipment Choice offered to:  Patient  DME Arranged:  Nebulizer machine DME Agency:  [place]. (Ama)  HH Arranged:  RN, PT [facility] Agency:     Status of Service:  Completed, signed off  If discussed at [place] of Stay Meetings, dates discussed:    Additional Comments:  Victoire Deans A, RN 11/28/2016, 12:01 PM

## 2016-11-28 NOTE — Progress Notes (Signed)
Per Jeani Hawking, CM patient is ready to go home. Patient given discharge teaching and paperwork regarding medications, diet, follow-up appointments and activity. Patient understanding verbalized. No complaints at this time. IV and telemetry discontinued prior to leaving. Skin assessment as previously charted and vitals are stable; on chornic O2 - portable tank from home. Patient being discharged to home. Caregiver/family present during discharge teaching. No further needs by Care Management. Prescriptions handed to patient.

## 2016-11-28 NOTE — Progress Notes (Signed)
Patient has received home nebulizer and eliquis coupon. Jeani Hawking, CM is working on [facility] and said she will let me know when I can send patient out for discharge.

## 2016-11-28 NOTE — Discharge Summary (Signed)
[place] at [place] NAME: Jill Gross    MR#:  [id number]  DATE OF BIRTH:  03-16-1956  DATE OF ADMISSION:  11/25/2016 ADMITTING PHYSICIAN: Fritzi Mandes, MD  DATE OF DISCHARGE: 11/28/2016  1:00 PM  PRIMARY CARE PHYSICIAN: [name], NP    ADMISSION DIAGNOSIS:  Other acute pulmonary embolism without acute cor pulmonale (HCC) [I26.99]  DISCHARGE DIAGNOSIS:  Active Problems:   Pulmonary emboli (HCC)   SECONDARY DIAGNOSIS:   Past Medical History:  Diagnosis Date  . Allergy   . Anemia    pernicious  . Anxiety   . Arthritis   . Atrophic vaginitis   . COPD (chronic obstructive pulmonary disease) ([place])   . Depression   . Diverticulosis   . Dyspnea   . Fatigue   . GERD (gastroesophageal reflux disease)   . History of chemotherapy 04/2016  . Hyperlipidemia   . Hypothyroid   . Insomnia   . Low back pain   . Lumbago   . 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 Osteopenia   . Small cell carcinoma of left lung ([place]) 01/2016   rad + chemo tx's  . Tobacco use   . Vitamin B12 deficiency     HOSPITAL COURSE:   60 year old female with past medical history of lung cancer, COPD,hypothyroidism, hypertension, hyperlipidemia, depression, who presents to the hospital Gross to shortness of breath and noted to have an acute pulmonary embolism.  1. acute pulmonary embolism-This was the cause of patient's shortness of breath on admission. Patient's risk factors for having pulmonary embolism on her poor mobility and also had a history of lung cancer. -Patient was initiated on Eliquis and has tolerated it well. She is not being discharged on oral Eliquis.   2. COPD - CT chest showing changes consistent with radiation pneumonitis. Pt. Was having wheezing/bronchospams and was given some IV steroids, scheduled duonebs, pulmicort nebs.  - she has improved and now being discharged on Oral Pred taper.  - she had some air hunger and was given  Roxanol which helped and she was discharged on that.    3. Tachycardia - sinus tachycardia Gross to underlying PE.  - remained stable while in the hospital.   4. Hypothyroidism - pt. Will cont. Synthroid.   5. Hx of Lung Cancer - currently in remission.  - cont. Follow up with Oncology as outpatient.   6.  Hyperlipidemia - pt. Will cont. Simvastatin  7. GERD - pt. Will resume Zantac.   DISCHARGE CONDITIONS:   Stable.   CONSULTS OBTAINED:    DRUG ALLERGIES:   Allergies  Allergen Reactions  . Macrobid [Nitrofurantoin] Anaphylaxis  . Skelaxin [Metaxalone] Anaphylaxis  . Effexor [Venlafaxine]   . Hctz [Hydrochlorothiazide] Other (See Comments)    cramps  . Neurontin [Gabapentin]   . Paxil [Paroxetine Hcl]   . Pravachol [Pravastatin Sodium] Other (See Comments)    aching  . Prozac [Fluoxetine Hcl]   . Sulfur Other (See Comments)    "blisters in mouth"  . Wellbutrin [Bupropion] Itching  . Zoloft [Sertraline Hcl]     DISCHARGE MEDICATIONS:   Allergies as of 11/28/2016      Reactions   Macrobid [nitrofurantoin] Anaphylaxis   Skelaxin [metaxalone] Anaphylaxis   Effexor [venlafaxine]    Hctz [hydrochlorothiazide] Other (See Comments)   cramps   Neurontin [gabapentin]    Paxil [paroxetine Hcl]    Pravachol [pravastatin Sodium] Other (See Comments)   aching   Prozac [fluoxetine  Hcl]    Sulfur Other (See Comments)   "blisters in mouth"   Wellbutrin [bupropion] Itching   Zoloft [sertraline Hcl]       Medication List    TAKE these medications   AMBULATORY NON FORMULARY MEDICATION Medication Name: incentive spirometer   amitriptyline 10 MG tablet Commonly known as:  ELAVIL Take 2 tablets (20 mg total) by mouth at bedtime.   apixaban 5 MG Tabs tablet Commonly known as:  ELIQUIS Take 2 tablets (10 mg total) by mouth 2 (two) times daily.   apixaban 5 MG Tabs tablet Commonly known as:  ELIQUIS Take 1 tablet (5 mg total) by mouth 2 (two) times daily. Start  taking on:  12/06/2016   cyanocobalamin 1000 MCG/ML injection Commonly known as:  (VITAMIN B-12) Inject 1 mL (1,000 mcg total) into the muscle once. What changed:  when to take this   feeding supplement (ENSURE ENLIVE) Liqd Take 237 mLs by mouth 2 (two) times daily between meals.   ipratropium-albuterol 0.5-2.5 (3) MG/3ML Soln Commonly known as:  DUONEB Take 3 mLs by nebulization every 6 (six) hours as needed.   levothyroxine 200 MCG tablet Commonly known as:  SYNTHROID, LEVOTHROID Take 1 tablet (200 mcg total) by mouth daily before breakfast.   morphine CONCENTRATE 10 MG/0.5ML Soln concentrated solution Take 0.5 mLs (10 mg total) by mouth every 4 (four) hours as needed for anxiety or shortness of breath.   ondansetron 8 MG tablet Commonly known as:  ZOFRAN Take 1 tablet (8 mg total) by mouth 2 (two) times daily as needed. Start on the third day after cisplatin chemotherapy.   predniSONE 10 MG tablet Commonly known as:  DELTASONE Label  & dispense according to the schedule below. 5 Pills PO for 1 day then, 4 Pills PO for 1 day, 3 Pills PO for 1 day, 2 Pills PO for 1 day, 1 Pill PO for 1 days then STOP.   ranitidine 150 MG tablet Commonly known as:  ZANTAC Take 150 mg by mouth 2 (two) times daily as needed for heartburn.   simvastatin 10 MG tablet Commonly known as:  ZOCOR Take 1 tablet (10 mg total) by mouth daily at 6 PM.   zolpidem 10 MG tablet Commonly known as:  AMBIEN Take 10 mg by mouth at bedtime.            Durable Medical Equipment        Start     Ordered   11/26/16 1559  For home use only DME Nebulizer machine  Once    Question:  Patient needs a nebulizer to treat with the following condition  Answer:  COPD (chronic obstructive pulmonary disease) ([place])   11/26/16 1558        DISCHARGE INSTRUCTIONS:   DIET:  Regular diet  DISCHARGE CONDITION:  Stable  ACTIVITY:  Activity as tolerated  OXYGEN:  Home Oxygen: Yes.     Oxygen Delivery: 2  liters/min via Patient connected to nasal cannula oxygen  DISCHARGE LOCATION:  Home with Home Health Nursing/PT.    If you experience worsening of your admission symptoms, develop shortness of breath, life threatening emergency, suicidal or homicidal thoughts you must seek medical attention immediately by calling 911 or calling your MD immediately  if symptoms less severe.  You Must read complete instructions/literature along with all the possible adverse reactions/side effects for all the Medicines you take and that have been prescribed to you. Take any new Medicines after you have completely understood and accpet  all the possible adverse reactions/side effects.   Please note  You were cared for by a hospitalist during your hospital stay. If you have any questions about your discharge medications or the care you received while you were in the hospital after you are discharged, you can call the unit and asked to speak with the hospitalist on call if the hospitalist that took care of you is not available. Once you are discharged, your primary care physician will handle any further medical issues. Please note that NO REFILLS for any discharge medications will be authorized once you are discharged, as it is imperative that you return to your primary care physician (or establish a relationship with a primary care physician if you do not have one) for your aftercare needs so that they can reassess your need for medications and monitor your lab values.     Today   Shortness of breath/wheezing improved.  Still tachy.  Roxanol improved her air hunger.   VITAL SIGNS:  Blood pressure 130/64, pulse 92, temperature 98.6 F (37 C), resp. rate 17, height '5\' 4"'$  (1.626 m), weight 72.6 kg (160 lb), SpO2 99 %.  I/O:   Intake/Output Summary (Last 24 hours) at 11/28/16 1555 Last data filed at 11/28/16 0900  Gross per 24 hour  Intake                0 ml  Output                0 ml  Net                0 ml     PHYSICAL EXAMINATION:   GENERAL:  60 y.o.-year-old patient lying in bed a bit anxious/nervous EYES: Pupils equal, round, reactive to light and accommodation. No scleral icterus. Extraocular muscles intact.  HEENT: Head atraumatic, normocephalic. Oropharynx and nasopharynx clear.  NECK:  Supple, no jugular venous distention. No thyroid enlargement, no tenderness.  LUNGS: Good a/e b/l, b/l minimal end-exp. Wheezing, No rales, rhonchi. No use of accessory muscles of respiration.  CARDIOVASCULAR: S1, S2 normal. Tachy No murmurs, rubs, or gallops.  ABDOMEN: Soft, nontender, nondistended. Bowel sounds present. No organomegaly or mass.  EXTREMITIES: No cyanosis, clubbing or edema b/l.    NEUROLOGIC: Cranial nerves II through XII are intact. No focal Motor or sensory deficits b/l. Globally weak.  PSYCHIATRIC: The patient is alert and oriented x 3.  SKIN: No obvious rash, lesion, or ulcer.   DATA REVIEW:   CBC  Recent Labs Lab 11/25/16 0952  WBC 7.1  HGB 10.2*  HCT 29.8*  PLT 235    Chemistries   Recent Labs Lab 11/25/16 0952  NA 137  K 3.8  CL 104  CO2 22  GLUCOSE 173*  BUN 11  CREATININE 0.80  CALCIUM 9.7  AST 53*  ALT 72*  ALKPHOS 71  BILITOT 0.1*    Cardiac Enzymes No results for input(s): TROPONINI in the last 168 hours.  Microbiology Results  Results for orders placed or performed during the hospital encounter of 11/25/16  Culture, blood (Routine x 2)     Status: None (Preliminary result)   Collection Time: 11/25/16  9:52 AM  Result Value Ref Range Status   Specimen Description BLOOD RIGHT ARM  Final   Special Requests   Final    BOTTLES DRAWN AEROBIC AND ANAEROBIC  AEROBIC 14CC, ANAEROBIC 15CC   Culture NO GROWTH 3 DAYS  Final   Report Status PENDING  Incomplete  Culture, blood (  Routine x 2)     Status: None (Preliminary result)   Collection Time: 11/25/16 11:50 AM  Result Value Ref Range Status   Specimen Description BLOOD RIGHT AC  Final   Special  Requests   Final    BOTTLES DRAWN AEROBIC AND ANAEROBIC AER 6ML ANA 3ML   Culture NO GROWTH 3 DAYS  Final   Report Status PENDING  Incomplete    RADIOLOGY:  No results found.    Management plans discussed with the patient, family and they are in agreement.  CODE STATUS:     Code Status Orders        Start     Ordered   11/25/16 1435  Full code  Continuous     11/25/16 1434    Code Status History    Date Active Date Inactive Code Status Order ID Comments User Context   10/31/2016 10:23 PM 11/05/2016  6:12 PM Full Code [id number]  Demetrios Loll, MD Inpatient      TOTAL TIME TAKING CARE OF THIS PATIENT: 40 minutes.    [name] M.D on 11/28/2016 at 3:55 PM  Between 7am to 6pm - Pager - [phone]  After 6pm go to [email or web address] - Technical brewer [employer] [facility]  Office  [phone]  CC: Primary care physician; [name], NP

## 2016-11-28 NOTE — Care Management Note (Signed)
Case Management Note  Patient Details  Name: KRISY DIX MRN: [id number] Date of Birth: 01/17/1956  Subjective/Objective:       Call to Floydene Flock at [facility] to deliver a portable nebulizer to Mrs Curington's room. Call to [facility] to request new HH-RN services. Amy at [facility] reports that she will have to check to see if [facility] is in network for [place] and will call this writer back with an update.  Mr Kirkeby has Mrs Kydd's portable oxygen tank with him and will bring it up to her room. Mrs Malina is on chronic 3L N/C.              Action/Plan:   Expected Discharge Date:  11/28/16               Expected Discharge Plan:  [place]  In-House Referral:     Discharge planning Services  CM Consult  Post Acute Care Choice:  Durable Medical Equipment Choice offered to:  Patient  DME Arranged:  Nebulizer machine DME Agency:  [place]. ([place])  HH Arranged:  RN, PT [facility] Agency:     Status of Service:  Completed, signed off  If discussed at [place] of Stay Meetings, dates discussed:    Additional Comments:  Harjot Zavadil A, RN 11/28/2016, 9:50 AM

## 2016-11-30 LAB — CULTURE, BLOOD (ROUTINE X 2)
CULTURE: NO GROWTH
Culture: NO GROWTH

## 2016-12-02 ENCOUNTER — Telehealth: Payer: Self-pay | Admitting: Unknown Physician Specialty

## 2016-12-02 NOTE — Telephone Encounter (Signed)
Pt is experiencing sinus drainage and would like to know what to do.

## 2016-12-02 NOTE — Telephone Encounter (Signed)
Routing to provider  

## 2016-12-03 ENCOUNTER — Other Ambulatory Visit: Payer: Managed Care, Other (non HMO)

## 2016-12-03 ENCOUNTER — Ambulatory Visit: Payer: Managed Care, Other (non HMO) | Admitting: Oncology

## 2016-12-03 NOTE — Telephone Encounter (Signed)
Use nasal saline, take OTC medication (benadryl, mucinex) if not better in 10 days, come see [place]

## 2016-12-03 NOTE — Telephone Encounter (Signed)
Called and let patient know what Dr. Wynetta Emery suggested.

## 2016-12-09 ENCOUNTER — Ambulatory Visit (INDEPENDENT_AMBULATORY_CARE_PROVIDER_SITE_OTHER): Payer: Managed Care, Other (non HMO) | Admitting: Unknown Physician Specialty

## 2016-12-09 ENCOUNTER — Encounter: Payer: Self-pay | Admitting: Unknown Physician Specialty

## 2016-12-09 DIAGNOSIS — R634 Abnormal weight loss: Secondary | ICD-10-CM

## 2016-12-09 DIAGNOSIS — Z09 Encounter for follow-up examination after completed treatment for conditions other than malignant neoplasm: Secondary | ICD-10-CM | POA: Diagnosis not present

## 2016-12-09 DIAGNOSIS — E039 Hypothyroidism, unspecified: Secondary | ICD-10-CM

## 2016-12-09 DIAGNOSIS — I2699 Other pulmonary embolism without acute cor pulmonale: Secondary | ICD-10-CM

## 2016-12-09 DIAGNOSIS — R000 Tachycardia, unspecified: Secondary | ICD-10-CM | POA: Diagnosis not present

## 2016-12-09 NOTE — Assessment & Plan Note (Signed)
Check TSH.  Seeing oncologist next week

## 2016-12-09 NOTE — Assessment & Plan Note (Signed)
Pulse ox is stable.  Repiratory effort is OK

## 2016-12-09 NOTE — Progress Notes (Signed)
BP 101/71 (BP Location: Left Arm, Patient Position: Sitting, Cuff Size: Normal)   Pulse (!) 132   Temp 98 F (36.7 C)   Wt 142 lb 3.2 oz (64.5 kg)   LMP  (LMP Unknown)   SpO2 99%   BMI 24.41 kg/m    Subjective:    Patient ID: Jill Gross, female    DOB: 11/01/1956, 61 y.o.   MRN: [id number]  HPI: Jill Gross is a 61 y.o. female  Chief Complaint  Patient presents with  . Hospitalization Follow-up    pt states she went to the hospital on 11/25/16 and states she has a blood clot and was put on blood thinners   . Medication Refill    pt states she ran out of 200 mg levothyroxine yesterday and took a 135 mg tablet today. States she needs a refill on the 200 mg levothyroxine    S/P hospitalization for a PE.   States she feels "weak."  The worse she has been.  She is "losing weight like crazy" and due to go back to the hematologist in 6 days.  States she has fatigue with "the least little bit" of activity.    PE Taking Eliquis.  Denies pain  Hypoxia On O2  Tachycardia Pulse is stable at rest.  Increases with any activity.  Noted on discharge.    Hypothyroid Severely hypothyroid in the hospital and was on 200 mcgs and ran out so restarted 135 that she used to take in the past.    Relevant past medical, surgical, family and social history reviewed and updated as indicated. Interim medical history since our last visit reviewed. Allergies and medications reviewed and updated.  Review of Systems  Per HPI unless specifically indicated above     Objective:    BP 101/71 (BP Location: Left Arm, Patient Position: Sitting, Cuff Size: Normal)   Pulse (!) 132   Temp 98 F (36.7 C)   Wt 142 lb 3.2 oz (64.5 kg)   LMP  (LMP Unknown)   SpO2 99%   BMI 24.41 kg/m   Wt Readings from Last 3 Encounters:  12/09/16 142 lb 3.2 oz (64.5 kg)  11/25/16 160 lb (72.6 kg)  11/18/16 150 lb (68 kg)    Physical Exam  Constitutional: She appears well-developed and well-nourished.    HENT:  Head: Normocephalic.  ceruman right ear.  Cleared with irrigation  Cardiovascular: Regular rhythm.  Tachycardia present.   Pulmonary/Chest: She has decreased breath sounds.  Psychiatric: She has a normal mood and affect. Her speech is normal and behavior is normal. Cognition and memory are normal.     Assessment & Plan:   Problem List Items Addressed This Visit      Unprioritized   Abnormal weight loss    Check TSH.  Seeing oncologist next week      Hospital discharge follow-up   Hypothyroidism    Check levels today.  Take the Synthroid 135 mcgs for now.  Call with results tomorrow and come up with a treatment plan      Relevant Orders   Thyroid Panel With TSH   Pulmonary emboli (HCC)    Pulse ox is stable.  Repiratory effort is OK      Tachycardia    132 at rest.  150 when she came in .  ? Related to thyroid.  I'm concerned about activity intolerance.  Echo done in the hospital.  Current tachycardia and activity intolerance has not changed from discharge.  BP is stable.  Check CMP, CBC, and TSH. Drinking a lot of caffeine with tea.  Only drink decaf      Relevant Orders   CBC with Differential/Platelet   Thyroid Panel With TSH   Comprehensive metabolic panel       Follow up plan: Return in about 2 weeks (around 12/23/2016).

## 2016-12-09 NOTE — Assessment & Plan Note (Addendum)
132 at rest.  150 when she came in .  ? Related to thyroid.  I'm concerned about activity intolerance.  Echo done in the hospital.  Current tachycardia and activity intolerance has not changed from discharge.   BP is stable.  Check CMP, CBC, and TSH. Drinking a lot of caffeine with tea.  Only drink decaf

## 2016-12-09 NOTE — Assessment & Plan Note (Signed)
Check levels today.  Take the Synthroid 135 mcgs for now.  Call with results tomorrow and come up with a treatment plan

## 2016-12-10 LAB — CBC WITH DIFFERENTIAL/PLATELET
BASOS ABS: 0 10*3/uL (ref 0.0–0.2)
Basos: 0 %
EOS (ABSOLUTE): 0.1 10*3/uL (ref 0.0–0.4)
Eos: 1 %
HEMATOCRIT: 32.2 % — AB (ref 34.0–46.6)
Hemoglobin: 10.7 g/dL — ABNORMAL LOW (ref 11.1–15.9)
IMMATURE GRANULOCYTES: 0 %
Immature Grans (Abs): 0 10*3/uL (ref 0.0–0.1)
LYMPHS ABS: 1 10*3/uL (ref 0.7–3.1)
Lymphs: 11 %
MCH: 30.6 pg (ref 26.6–33.0)
MCHC: 33.2 g/dL (ref 31.5–35.7)
MCV: 92 fL (ref 79–97)
MONOS ABS: 0.8 10*3/uL (ref 0.1–0.9)
Monocytes: 9 %
NEUTROS PCT: 79 %
Neutrophils Absolute: 7.6 10*3/uL — ABNORMAL HIGH (ref 1.4–7.0)
PLATELETS: 243 10*3/uL (ref 150–379)
RBC: 3.5 x10E6/uL — AB (ref 3.77–5.28)
RDW: 16.5 % — AB (ref 12.3–15.4)
WBC: 9.5 10*3/uL (ref 3.4–10.8)

## 2016-12-10 LAB — COMPREHENSIVE METABOLIC PANEL
A/G RATIO: 1.3 (ref 1.2–2.2)
ALBUMIN: 3.6 g/dL (ref 3.6–4.8)
ALK PHOS: 68 IU/L (ref 39–117)
ALT: 41 IU/L — ABNORMAL HIGH (ref 0–32)
AST: 19 IU/L (ref 0–40)
BUN / CREAT RATIO: 9 — AB (ref 12–28)
BUN: 6 mg/dL — ABNORMAL LOW (ref 8–27)
Bilirubin Total: 0.5 mg/dL (ref 0.0–1.2)
CO2: 21 mmol/L (ref 18–29)
CREATININE: 0.66 mg/dL (ref 0.57–1.00)
Calcium: 9.1 mg/dL (ref 8.7–10.3)
Chloride: 100 mmol/L (ref 96–106)
GFR calc Af Amer: 111 mL/min/{1.73_m2} (ref >59)
GFR, EST NON AFRICAN AMERICAN: 96 mL/min/{1.73_m2} (ref >59)
GLOBULIN, TOTAL: 2.8 g/dL (ref 1.5–4.5)
Glucose: 210 mg/dL — ABNORMAL HIGH (ref 65–99)
POTASSIUM: 3.3 mmol/L — AB (ref 3.5–5.2)
SODIUM: 140 mmol/L (ref 134–144)
Total Protein: 6.4 g/dL (ref 6.0–8.5)

## 2016-12-10 LAB — THYROID PANEL WITH TSH
Free Thyroxine Index: 4.4 (ref 1.2–4.9)
T3 UPTAKE RATIO: 34 % (ref 24–39)
T4 TOTAL: 12.8 ug/dL — AB (ref 4.5–12.0)
TSH: 0.28 u[IU]/mL — ABNORMAL LOW (ref 0.450–4.500)

## 2016-12-15 ENCOUNTER — Inpatient Hospital Stay: Payer: Managed Care, Other (non HMO) | Attending: Oncology | Admitting: Oncology

## 2016-12-15 ENCOUNTER — Inpatient Hospital Stay: Payer: Managed Care, Other (non HMO) | Admitting: Oncology

## 2016-12-15 ENCOUNTER — Other Ambulatory Visit: Payer: Managed Care, Other (non HMO)

## 2016-12-15 ENCOUNTER — Inpatient Hospital Stay: Payer: Managed Care, Other (non HMO)

## 2016-12-15 ENCOUNTER — Encounter: Payer: Self-pay | Admitting: Unknown Physician Specialty

## 2016-12-15 VITALS — BP 125/78 | HR 120 | Temp 96.0°F | Resp 18 | Wt 137.0 lb

## 2016-12-15 DIAGNOSIS — R5383 Other fatigue: Secondary | ICD-10-CM | POA: Diagnosis not present

## 2016-12-15 DIAGNOSIS — F329 Major depressive disorder, single episode, unspecified: Secondary | ICD-10-CM | POA: Diagnosis not present

## 2016-12-15 DIAGNOSIS — Z87891 Personal history of nicotine dependence: Secondary | ICD-10-CM | POA: Insufficient documentation

## 2016-12-15 DIAGNOSIS — M8580 Other specified disorders of bone density and structure, unspecified site: Secondary | ICD-10-CM | POA: Insufficient documentation

## 2016-12-15 DIAGNOSIS — E538 Deficiency of other specified B group vitamins: Secondary | ICD-10-CM | POA: Insufficient documentation

## 2016-12-15 DIAGNOSIS — R634 Abnormal weight loss: Secondary | ICD-10-CM | POA: Diagnosis not present

## 2016-12-15 DIAGNOSIS — E039 Hypothyroidism, unspecified: Secondary | ICD-10-CM

## 2016-12-15 DIAGNOSIS — M129 Arthropathy, unspecified: Secondary | ICD-10-CM | POA: Diagnosis not present

## 2016-12-15 DIAGNOSIS — I2699 Other pulmonary embolism without acute cor pulmonale: Secondary | ICD-10-CM

## 2016-12-15 DIAGNOSIS — F419 Anxiety disorder, unspecified: Secondary | ICD-10-CM

## 2016-12-15 DIAGNOSIS — C3432 Malignant neoplasm of lower lobe, left bronchus or lung: Secondary | ICD-10-CM

## 2016-12-15 DIAGNOSIS — E876 Hypokalemia: Secondary | ICD-10-CM | POA: Insufficient documentation

## 2016-12-15 DIAGNOSIS — I700 Atherosclerosis of aorta: Secondary | ICD-10-CM | POA: Diagnosis not present

## 2016-12-15 DIAGNOSIS — N952 Postmenopausal atrophic vaginitis: Secondary | ICD-10-CM

## 2016-12-15 DIAGNOSIS — Z7901 Long term (current) use of anticoagulants: Secondary | ICD-10-CM | POA: Insufficient documentation

## 2016-12-15 DIAGNOSIS — R531 Weakness: Secondary | ICD-10-CM | POA: Diagnosis not present

## 2016-12-15 DIAGNOSIS — M545 Low back pain: Secondary | ICD-10-CM | POA: Insufficient documentation

## 2016-12-15 DIAGNOSIS — J449 Chronic obstructive pulmonary disease, unspecified: Secondary | ICD-10-CM | POA: Insufficient documentation

## 2016-12-15 DIAGNOSIS — Z923 Personal history of irradiation: Secondary | ICD-10-CM

## 2016-12-15 DIAGNOSIS — G629 Polyneuropathy, unspecified: Secondary | ICD-10-CM | POA: Diagnosis not present

## 2016-12-15 DIAGNOSIS — J90 Pleural effusion, not elsewhere classified: Secondary | ICD-10-CM | POA: Insufficient documentation

## 2016-12-15 DIAGNOSIS — E785 Hyperlipidemia, unspecified: Secondary | ICD-10-CM | POA: Diagnosis not present

## 2016-12-15 DIAGNOSIS — K219 Gastro-esophageal reflux disease without esophagitis: Secondary | ICD-10-CM | POA: Diagnosis not present

## 2016-12-15 DIAGNOSIS — G4700 Insomnia, unspecified: Secondary | ICD-10-CM | POA: Insufficient documentation

## 2016-12-15 DIAGNOSIS — D510 Vitamin B12 deficiency anemia due to intrinsic factor deficiency: Secondary | ICD-10-CM

## 2016-12-15 DIAGNOSIS — Z8701 Personal history of pneumonia (recurrent): Secondary | ICD-10-CM | POA: Insufficient documentation

## 2016-12-15 DIAGNOSIS — C3402 Malignant neoplasm of left main bronchus: Secondary | ICD-10-CM

## 2016-12-15 DIAGNOSIS — Z85118 Personal history of other malignant neoplasm of bronchus and lung: Secondary | ICD-10-CM | POA: Insufficient documentation

## 2016-12-15 DIAGNOSIS — R05 Cough: Secondary | ICD-10-CM | POA: Diagnosis not present

## 2016-12-15 DIAGNOSIS — Z79899 Other long term (current) drug therapy: Secondary | ICD-10-CM

## 2016-12-15 DIAGNOSIS — I313 Pericardial effusion (noninflammatory): Secondary | ICD-10-CM

## 2016-12-15 DIAGNOSIS — Z95828 Presence of other vascular implants and grafts: Secondary | ICD-10-CM

## 2016-12-15 DIAGNOSIS — R630 Anorexia: Secondary | ICD-10-CM | POA: Insufficient documentation

## 2016-12-15 DIAGNOSIS — Z86711 Personal history of pulmonary embolism: Secondary | ICD-10-CM | POA: Insufficient documentation

## 2016-12-15 DIAGNOSIS — Z9981 Dependence on supplemental oxygen: Secondary | ICD-10-CM | POA: Insufficient documentation

## 2016-12-15 DIAGNOSIS — Z9221 Personal history of antineoplastic chemotherapy: Secondary | ICD-10-CM | POA: Insufficient documentation

## 2016-12-15 DIAGNOSIS — Z8719 Personal history of other diseases of the digestive system: Secondary | ICD-10-CM | POA: Insufficient documentation

## 2016-12-15 LAB — CBC WITH DIFFERENTIAL/PLATELET
Basophils Absolute: 0 10*3/uL (ref 0–0.1)
Basophils Relative: 0 %
EOS ABS: 0.1 10*3/uL (ref 0–0.7)
EOS PCT: 2 %
HCT: 28.3 % — ABNORMAL LOW (ref 35.0–47.0)
Hemoglobin: 9.6 g/dL — ABNORMAL LOW (ref 12.0–16.0)
LYMPHS ABS: 1.4 10*3/uL (ref 1.0–3.6)
Lymphocytes Relative: 25 %
MCH: 30.5 pg (ref 26.0–34.0)
MCHC: 33.9 g/dL (ref 32.0–36.0)
MCV: 90 fL (ref 80.0–100.0)
MONO ABS: 0.6 10*3/uL (ref 0.2–0.9)
MONOS PCT: 11 %
Neutro Abs: 3.5 10*3/uL (ref 1.4–6.5)
Neutrophils Relative %: 62 %
PLATELETS: 209 10*3/uL (ref 150–440)
RBC: 3.15 MIL/uL — ABNORMAL LOW (ref 3.80–5.20)
RDW: 16.5 % — ABNORMAL HIGH (ref 11.5–14.5)
WBC: 5.5 10*3/uL (ref 3.6–11.0)

## 2016-12-15 LAB — COMPREHENSIVE METABOLIC PANEL
ALT: 35 U/L (ref 14–54)
ANION GAP: 9 (ref 5–15)
AST: 31 U/L (ref 15–41)
Albumin: 3.5 g/dL (ref 3.5–5.0)
Alkaline Phosphatase: 63 U/L (ref 38–126)
BUN: 6 mg/dL (ref 6–20)
CALCIUM: 9.1 mg/dL (ref 8.9–10.3)
CHLORIDE: 103 mmol/L (ref 101–111)
CO2: 24 mmol/L (ref 22–32)
Creatinine, Ser: 0.62 mg/dL (ref 0.44–1.00)
GFR calc Af Amer: >60 mL/min (ref >60)
GFR calc non Af Amer: >60 mL/min (ref >60)
Glucose, Bld: 111 mg/dL — ABNORMAL HIGH (ref 65–99)
Potassium: 3 mmol/L — ABNORMAL LOW (ref 3.5–5.1)
SODIUM: 136 mmol/L (ref 135–145)
Total Bilirubin: 0.6 mg/dL (ref 0.3–1.2)
Total Protein: 7.2 g/dL (ref 6.5–8.1)

## 2016-12-15 MED ORDER — SODIUM CHLORIDE 0.9% FLUSH
10.0000 mL | INTRAVENOUS | Status: DC | PRN
  Administered 2016-12-15: 10 mL via INTRAVENOUS
  Filled 2016-12-15: qty 10

## 2016-12-15 MED ORDER — HEPARIN SOD (PORK) LOCK FLUSH 100 UNIT/ML IV SOLN
500.0000 [IU] | Freq: Once | INTRAVENOUS | Status: AC
  Administered 2016-12-15: 500 [IU] via INTRAVENOUS

## 2016-12-15 NOTE — Progress Notes (Signed)
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  INTERVAL HISTORY: Patient returns to clinic today for further evaluation. Since her last clinic visit one month ago she was admitted to the hospital twice once with pneumonia and once with pulmonary embolism. She continues to have increased weakness and fatigue that is unchanged. She now is on chronic oxygen. She has a fair appetite and weight loss.  She also has continued neuropathy particularly in her fingertips. She has no other neurologic complaints. She denies any recent fevers. She denies any chest pain or hemoptysis. She denies any nausea, vomiting, constipation, or diarrhea. She has no urinary complaints. Patient feels generally terrible, but offers no further specific complaints.   REVIEW OF SYSTEMS:   Review of Systems  Constitutional: Positive for malaise/fatigue and weight loss. Negative for fever.  HENT: Negative for sore throat.   Eyes: Negative.   Respiratory: Positive for cough and shortness of breath. Negative for hemoptysis.   Cardiovascular: Negative.  Negative for chest pain.  Gastrointestinal: Negative for nausea and vomiting.  Genitourinary: Negative.   Musculoskeletal: Negative.   Skin: Negative.   Neurological: Positive for sensory change and weakness. Negative for dizziness and tingling.  Psychiatric/Behavioral: Positive for depression.    As per HPI. Otherwise, a complete review of systems is negative.  PAST MEDICAL HISTORY: Past Medical History:  Diagnosis Date  . Allergy   . Anemia    pernicious  . Anxiety   . Arthritis   . Atrophic vaginitis   . COPD (chronic obstructive pulmonary disease) ([place])   . Depression   .  Diverticulosis   . Dyspnea   . Fatigue   . GERD (gastroesophageal reflux disease)   . History of chemotherapy 04/2016  . Hyperlipidemia   . Hypothyroid   . Insomnia   . Low back pain   . Lumbago   . Neuropathy due to chemotherapeutic drug ([facility])   . Osteopenia   . Small cell carcinoma of left lung ([place]) 01/2016   rad + chemo tx's  . Tobacco use   . Vitamin B12 deficiency     PAST SURGICAL HISTORY: Past Surgical History:  Procedure Laterality Date  . ABDOMINAL HYSTERECTOMY    . ANKLE SURGERY     x4  . CARPAL TUNNEL RELEASE  April 2015  . CHOLECYSTECTOMY    . ENDOBRONCHIAL ULTRASOUND N/A 01/07/2016   Procedure: ENDOBRONCHIAL ULTRASOUND;  Surgeon: Vilinda Boehringer, MD;  Location: [facility] ORS;  Service: Cardiopulmonary;  Laterality: N/A;  . ENDOBRONCHIAL ULTRASOUND N/A 09/16/2016   Procedure: ENDOBRONCHIAL ULTRASOUND;  Surgeon: Vilinda Boehringer, MD;  Location: [facility] ORS;  Service: Cardiopulmonary;  Laterality: N/A;  . HERNIA REPAIR    . PORTACATH PLACEMENT Right 01/23/2016   Procedure: INSERTION PORT-A-CATH;  Surgeon: [name], MD;  Location: [facility] ORS;  Service: General;  Laterality: Right;    FAMILY HISTORY Family History  Problem Relation Age of Onset  . Emphysema Mother   . Diabetes Father        ADVANCED DIRECTIVES:    HEALTH MAINTENANCE: Social History  Substance Use Topics  . Smoking status: Former Smoker    Packs/day: 1.00    Years: 30.00    Types: Cigarettes    Quit date: 01/11/2016  . Smokeless  tobacco: Never Used  . Alcohol use No     Allergies  Allergen Reactions  . Macrobid [Nitrofurantoin] Anaphylaxis  . Skelaxin [Metaxalone] Anaphylaxis  . Effexor [Venlafaxine]   . Hctz [Hydrochlorothiazide] Other (See Comments)    cramps  . Neurontin [Gabapentin]   . Paxil [Paroxetine Hcl]   . Pravachol [Pravastatin Sodium] Other (See Comments)    aching  . Prozac [Fluoxetine Hcl]   . Sulfur Other (See Comments)    "blisters in mouth"  . Wellbutrin [Bupropion]  Itching  . Zoloft [Sertraline Hcl]     Current Outpatient Prescriptions  Medication Sig Dispense Refill  . amitriptyline (ELAVIL) 10 MG tablet Take 2 tablets (20 mg total) by mouth at bedtime. 60 tablet 12  . apixaban (ELIQUIS) 5 MG TABS tablet Take 1 tablet (5 mg total) by mouth 2 (two) times daily. 60 tablet 1  . cyanocobalamin (,VITAMIN B-12,) 1000 MCG/ML injection Inject 1 mL (1,000 mcg total) into the muscle once. (Patient taking differently: Inject 1,000 mcg into the muscle every 30 (thirty) days. ) 10 mL 1  . feeding supplement, ENSURE ENLIVE, (ENSURE ENLIVE) LIQD Take 237 mLs by mouth 2 (two) times daily between meals. 237 mL 12  . ipratropium-albuterol (DUONEB) 0.5-2.5 (3) MG/3ML SOLN Take 3 mLs by nebulization every 6 (six) hours as needed. 360 mL 0  . levothyroxine (SYNTHROID, LEVOTHROID) 137 MCG tablet Take 137 mcg by mouth daily before breakfast.    . ranitidine (ZANTAC) 150 MG tablet Take 150 mg by mouth 2 (two) times daily as needed for heartburn.    . simvastatin (ZOCOR) 10 MG tablet Take 1 tablet (10 mg total) by mouth daily at 6 PM. 30 tablet 6  . zolpidem (AMBIEN) 10 MG tablet Take 10 mg by mouth at bedtime.     No current facility-administered medications for this visit.    Facility-Administered Medications Ordered in Other Visits  Medication Dose Route Frequency Provider Last Rate Last Dose  . sodium chloride flush (NS) 0.9 % injection 10 mL  10 mL Intravenous PRN Lloyd Huger, MD   10 mL at 12/15/16 1359    OBJECTIVE: Vitals:   12/15/16 1416  BP: 125/78  Pulse: (!) 120  Resp: 18  Temp: (!) 96 F (35.6 C)     Body mass index is 23.52 kg/m.    ECOG FS:2 - Symptomatic, <50% confined to bed  General: No acute distress. Sitting in wheelchair Eyes: Pink conjunctiva, anicteric sclera. HEENT: Oropharynx clear without erythema or exudate. Lungs: Clear to auscultation bilaterally. Heart: Tachycardia. No rubs, murmurs, or gallops. Musculoskeletal: No edema,  cyanosis, or clubbing. Neuro: Alert, answering all questions appropriately. Cranial nerves grossly intact. Skin: No rashes or petechiae noted. Poor skin turgor. Psych: Normal affect.   LAB RESULTS:  Lab Results  Component Value Date   NA 136 12/15/2016   K 3.0 (L) 12/15/2016   CL 103 12/15/2016   CO2 24 12/15/2016   GLUCOSE 111 (H) 12/15/2016   BUN 6 12/15/2016   CREATININE 0.62 12/15/2016   CALCIUM 9.1 12/15/2016   PROT 7.2 12/15/2016   ALBUMIN 3.5 12/15/2016   AST 31 12/15/2016   ALT 35 12/15/2016   ALKPHOS 63 12/15/2016   BILITOT 0.6 12/15/2016   GFRNONAA >60 12/15/2016   GFRAA >60 12/15/2016    Lab Results  Component Value Date   WBC 5.5 12/15/2016   NEUTROABS 3.5 12/15/2016   HGB 9.6 (L) 12/15/2016   HCT 28.3 (L) 12/15/2016   MCV 90.0 12/15/2016  PLT 209 12/15/2016     STUDIES: Dg Chest 2 View  Result Date: 11/25/2016 CLINICAL DATA:  Shortness of breath. EXAM: CHEST  2 VIEW COMPARISON:  Radiographs of October 31, 2016. FINDINGS: Stable cardiomediastinal silhouette. Atherosclerosis of thoracic aorta is noted. Right internal jugular Port-A-Cath is stable in position. Right lung is clear but hyperexpanded. No pneumothorax is noted. Stable left perihilar and basilar opacities are noted which may represent edema, pneumonia or scarring. IMPRESSION: Stable left lung opacities as described above. Aortic atherosclerosis. Electronically Signed   By: Marijo Conception, M.D.   On: 11/25/2016 10:24   Ct Angio Chest Pe W And/or Wo Contrast  Addendum Date: 11/25/2016   ADDENDUM REPORT: 11/25/2016 11:59 ADDENDUM: The original report was by Dr. Van Clines. The following addendum is by Dr. Van Clines: These results were called by telephone at the time of interpretation on 11/25/2016 at 11:59 am to Dr. Conni Slipper , who verbally acknowledged these results. Electronically Signed   By: Van Clines M.D.   On: 11/25/2016 11:59   Result Date: 11/25/2016 CLINICAL  DATA:  Shortness of breath. Cough and wheezing for 2 days. Small cell carcinoma of the left lung, reportedly in remission. EXAM: CT ANGIOGRAPHY CHEST WITH CONTRAST TECHNIQUE: Multidetector CT imaging of the chest was performed using the standard protocol during bolus administration of intravenous contrast. Multiplanar CT image reconstructions and MIPs were obtained to evaluate the vascular anatomy. CONTRAST:  75 cc Isovue 370 COMPARISON:  Multiple exams, including 08/29/2016 FINDINGS: Cardiovascular: Acute pulmonary embolus in the right lower lobe pulmonary artery and segmental branches. Overall clot burden is small to moderate. Right ventricular to left ventricular ratio is 0.75. Aortic and branch vessel atherosclerotic calcification noted. Small pericardial effusion similar to prior. Mediastinum/Nodes: No pathologic mediastinal or definite hilar adenopathy identified. Lungs/Pleura: Reduced size of the left pleural effusion, currently small, with bilateral patchy ground-glass opacities in both lungs, some increase bandlike airspace opacity in the left upper lobe, and some mild airspace opacity in the lingula. There is a combination of atelectasis and airspace filling process in the left lower lobe but the left lower lobe aeration is better than previous. There is narrowing of the left lower lobe bronchus on image 46/7 which could be inflammatory or due to tumor, but the bronchus is not completely occluded as it was on 08/29/2016. Biapical pleuroparenchymal scarring. Upper Abdomen: Postoperative findings from prior cholecystectomy. Musculoskeletal: Mild thoracic spondylosis. Review of the MIP images confirms the above findings. IMPRESSION: 1. Acute pulmonary embolus in the right lower lobe pulmonary artery extending into segmental vessels. Overall clot burden is low-to-moderate. Right ventricular to left ventricular ratio is normal, without compelling signs of right heart strain at this time. 2. Patchy ground-glass  opacities in both lungs possibly from low-level edema. 3. Improved aeration in the left lower lobe although there is still considerable atelectasis and airspace opacity. There is also some bandlike opacity in the left upper lobe which is increased. Much of this may be due to radiation therapy. Previously occluded left lower lobe bronchus is now severely stenotic rather than occluded, as on image 46/7, this could be from inflammation or tumor. 4. Small left pleural effusion and small pericardial effusion, both similar to prior. 5. Aortic and branch vessel atherosclerosis. Radiology assistant personnel have been notified to put me in telephone contact with the referring physician or the referring physician's clinical representative in order to discuss these findings. Once this communication is established I will issue an addendum to this report  for documentation purposes. Electronically Signed: By: Van Clines M.D. 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  PLAN:    1. 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 Left lower lobe collapse improved. Previously, bronchoscopy and biopsy results noted with no obvious evidence of recurrence either. No intervention is needed at this time. We will schedule restaging CT scan in 3 months in March 2018.  2. Pulmonary embolism: Likely secondary to increased sedentary lifestyle. Continue Eliquis for a total of 6 months completing in June 2018.    3. Poor appetite: Likely multifactorial,  monitor.  4. Hypokalemia: Continue oral supplementation as prescribed. 5. Peripheral neuropathy: Patient is no longer taking gabapentin. 6. Anemia: Significantly improved, monitor. 7. Thyroid panel: Previously T4 was nearly undetectable with elevated TSH. Patient now  appears to be slightly overtreated. Currently taking 135 g Synthroid. Will defer to primary care for adjustment of medications.  8. Disposition: Return to clinic in 6 weeks for further evaluation.  Patient expressed understanding and was in agreement with this plan. She also understands that She can call clinic at any time with any questions, concerns, or complaints.    Lloyd Huger, MD   12/15/2016 2:57 PM

## 2016-12-15 NOTE — Progress Notes (Signed)
Has recently been hospitalized for pneumonia and blood clots. Pt complains of feeling very weak today.

## 2016-12-15 NOTE — Progress Notes (Deleted)
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  INTERVAL HISTORY: Patient returns to clinic today for further evaluation she continues to have profound weakness and fatigue. She continues to have shortness of breath and wheezing. She has a poor appetite.  She also has continued neuropathy particularly in her fingertips. She complains of new onset urinary frequency. She has no other neurologic complaints. She denies any recent fevers. She denies any chest pain or hemoptysis. She denies any nausea, vomiting, constipation, or diarrhea. She has no urinary complaints. Patient feels generally terrible, but offers no further specific complaints.   REVIEW OF SYSTEMS:   Review of Systems  Constitutional: Positive for malaise/fatigue. Negative for fever and weight loss.  HENT: Negative for sore throat.   Eyes: Negative.   Respiratory: Positive for cough and shortness of breath. Negative for hemoptysis.   Cardiovascular: Negative.  Negative for chest pain.  Gastrointestinal: Negative for nausea and vomiting.  Genitourinary: Negative.   Musculoskeletal: Negative.   Skin: Negative.   Neurological: Positive for sensory change and weakness. Negative for dizziness and tingling.  Psychiatric/Behavioral: Positive for depression.    As per HPI. Otherwise, a complete review of systems is negative.  PAST MEDICAL HISTORY: Past Medical History:  Diagnosis Date  . Allergy   . Anemia    pernicious  . Anxiety   . Arthritis   . Atrophic vaginitis   . COPD (chronic obstructive pulmonary disease) ([place])   . Depression   . Diverticulosis   . Dyspnea   . Fatigue   . GERD (gastroesophageal reflux disease)   . History of  chemotherapy 04/2016  . Hyperlipidemia   . Hypothyroid   . Insomnia   . Low back pain   . Lumbago   . Neuropathy due to chemotherapeutic drug ([place])   . Osteopenia   . Small cell carcinoma of left lung ([place]) 01/2016   rad + chemo tx's  . Tobacco use   . Vitamin B12 deficiency     PAST SURGICAL HISTORY: Past Surgical History:  Procedure Laterality Date  . ABDOMINAL HYSTERECTOMY    . ANKLE SURGERY     x4  . CARPAL TUNNEL RELEASE  April 2015  . CHOLECYSTECTOMY    . ENDOBRONCHIAL ULTRASOUND N/A 01/07/2016   Procedure: ENDOBRONCHIAL ULTRASOUND;  Surgeon: Vilinda Boehringer, MD;  Location: [facility] ORS;  Service: Cardiopulmonary;  Laterality: N/A;  . ENDOBRONCHIAL ULTRASOUND N/A 09/16/2016   Procedure: ENDOBRONCHIAL ULTRASOUND;  Surgeon: Vilinda Boehringer, MD;  Location: [facility] ORS;  Service: Cardiopulmonary;  Laterality: N/A;  . HERNIA REPAIR    . PORTACATH PLACEMENT Right 01/23/2016   Procedure: INSERTION PORT-A-CATH;  Surgeon: [name], MD;  Location: [facility] ORS;  Service: General;  Laterality: Right;    FAMILY HISTORY Family History  Problem Relation Age of Onset  . Emphysema Mother   . Diabetes Father        ADVANCED DIRECTIVES:    HEALTH MAINTENANCE: Social History  Substance Use Topics  . Smoking status: Former Smoker    Packs/day: 1.00    Years: 30.00    Types: Cigarettes    Quit date: 01/11/2016  . Smokeless tobacco: Never Used  . Alcohol use No     Allergies  Allergen Reactions  . Macrobid [  Nitrofurantoin] Anaphylaxis  . Skelaxin [Metaxalone] Anaphylaxis  . Effexor [Venlafaxine]   . Hctz [Hydrochlorothiazide] Other (See Comments)    cramps  . Neurontin [Gabapentin]   . Paxil [Paroxetine Hcl]   . Pravachol [Pravastatin Sodium] Other (See Comments)    aching  . Prozac [Fluoxetine Hcl]   . Sulfur Other (See Comments)    "blisters in mouth"  . Wellbutrin [Bupropion] Itching  . Zoloft [Sertraline Hcl]     Current Outpatient Prescriptions  Medication Sig Dispense  Refill  . AMBULATORY NON FORMULARY MEDICATION Medication Name: incentive spirometer 1 each 0  . amitriptyline (ELAVIL) 10 MG tablet Take 2 tablets (20 mg total) by mouth at bedtime. 60 tablet 12  . apixaban (ELIQUIS) 5 MG TABS tablet Take 1 tablet (5 mg total) by mouth 2 (two) times daily. 60 tablet 1  . cyanocobalamin (,VITAMIN B-12,) 1000 MCG/ML injection Inject 1 mL (1,000 mcg total) into the muscle once. (Patient taking differently: Inject 1,000 mcg into the muscle every 30 (thirty) days. ) 10 mL 1  . feeding supplement, ENSURE ENLIVE, (ENSURE ENLIVE) LIQD Take 237 mLs by mouth 2 (two) times daily between meals. 237 mL 12  . ipratropium-albuterol (DUONEB) 0.5-2.5 (3) MG/3ML SOLN Take 3 mLs by nebulization every 6 (six) hours as needed. 360 mL 0  . levothyroxine (SYNTHROID, LEVOTHROID) 200 MCG tablet Take 1 tablet (200 mcg total) by mouth daily before breakfast. 30 tablet 0  . Morphine Sulfate (MORPHINE CONCENTRATE) 10 MG/0.5ML SOLN concentrated solution Take 0.5 mLs (10 mg total) by mouth every 4 (four) hours as needed for anxiety or shortness of breath. 180 mL 0  . ondansetron (ZOFRAN) 8 MG tablet Take 1 tablet (8 mg total) by mouth 2 (two) times daily as needed. Start on the third day after cisplatin chemotherapy. (Patient not taking: Reported on 12/09/2016) 30 tablet 1  . ranitidine (ZANTAC) 150 MG tablet Take 150 mg by mouth 2 (two) times daily as needed for heartburn.    . simvastatin (ZOCOR) 10 MG tablet Take 1 tablet (10 mg total) by mouth daily at 6 PM. 30 tablet 6  . zolpidem (AMBIEN) 10 MG tablet Take 10 mg by mouth at bedtime.     Current Facility-Administered Medications  Medication Dose Route Frequency Provider Last Rate Last Dose  . cyanocobalamin ((VITAMIN B-12)) injection 1,000 mcg  1,000 mcg Intramuscular Q30 days [name], NP   1,000 mcg at 11/18/16 1135    OBJECTIVE: There were no vitals filed for this visit.   There is no height or weight on file to calculate BMI.     ECOG FS:3 - Symptomatic, >50% confined to bed  General: Ill-appearing. Sitting in wheelchair Eyes: Pink conjunctiva, anicteric sclera. HEENT: Oropharynx clear without erythema or exudate. Lungs: Clear to auscultation bilaterally. Heart: Tachycardia. No rubs, murmurs, or gallops. Musculoskeletal: No edema, cyanosis, or clubbing. Neuro: Alert, answering all questions appropriately. Cranial nerves grossly intact. Skin: No rashes or petechiae noted. Poor skin turgor. Psych: Normal affect.   LAB RESULTS:  Lab Results  Component Value Date   NA 140 12/09/2016   K 3.3 (L) 12/09/2016   CL 100 12/09/2016   CO2 21 12/09/2016   GLUCOSE 210 (H) 12/09/2016   BUN 6 (L) 12/09/2016   CREATININE 0.66 12/09/2016   CALCIUM 9.1 12/09/2016   PROT 6.4 12/09/2016   ALBUMIN 3.6 12/09/2016   AST 19 12/09/2016   ALT 41 (H) 12/09/2016   ALKPHOS 68 12/09/2016   BILITOT 0.5 12/09/2016   GFRNONAA 96  12/09/2016   GFRAA 111 12/09/2016    Lab Results  Component Value Date   WBC 9.5 12/09/2016   NEUTROABS 7.6 (H) 12/09/2016   HGB 10.2 (L) 11/25/2016   HCT 32.2 (L) 12/09/2016   MCV 92 12/09/2016   PLT 243 12/09/2016     STUDIES: Dg Chest 2 View  Result Date: 11/25/2016 CLINICAL DATA:  Shortness of breath. EXAM: CHEST  2 VIEW COMPARISON:  Radiographs of October 31, 2016. FINDINGS: Stable cardiomediastinal silhouette. Atherosclerosis of thoracic aorta is noted. Right internal jugular Port-A-Cath is stable in position. Right lung is clear but hyperexpanded. No pneumothorax is noted. Stable left perihilar and basilar opacities are noted which may represent edema, pneumonia or scarring. IMPRESSION: Stable left lung opacities as described above. Aortic atherosclerosis. Electronically Signed   By: Marijo Conception, M.D.   On: 11/25/2016 10:24   Ct Angio Chest Pe W And/or Wo Contrast  Addendum Date: 11/25/2016   ADDENDUM REPORT: 11/25/2016 11:59 ADDENDUM: The original report was by Dr. Van Clines.  The following addendum is by Dr. Van Clines: These results were called by telephone at the time of interpretation on 11/25/2016 at 11:59 am to Dr. Conni Slipper , who verbally acknowledged these results. Electronically Signed   By: Van Clines M.D.   On: 11/25/2016 11:59   Result Date: 11/25/2016 CLINICAL DATA:  Shortness of breath. Cough and wheezing for 2 days. Small cell carcinoma of the left lung, reportedly in remission. EXAM: CT ANGIOGRAPHY CHEST WITH CONTRAST TECHNIQUE: Multidetector CT imaging of the chest was performed using the standard protocol during bolus administration of intravenous contrast. Multiplanar CT image reconstructions and MIPs were obtained to evaluate the vascular anatomy. CONTRAST:  75 cc Isovue 370 COMPARISON:  Multiple exams, including 08/29/2016 FINDINGS: Cardiovascular: Acute pulmonary embolus in the right lower lobe pulmonary artery and segmental branches. Overall clot burden is small to moderate. Right ventricular to left ventricular ratio is 0.75. Aortic and branch vessel atherosclerotic calcification noted. Small pericardial effusion similar to prior. Mediastinum/Nodes: No pathologic mediastinal or definite hilar adenopathy identified. Lungs/Pleura: Reduced size of the left pleural effusion, currently small, with bilateral patchy ground-glass opacities in both lungs, some increase bandlike airspace opacity in the left upper lobe, and some mild airspace opacity in the lingula. There is a combination of atelectasis and airspace filling process in the left lower lobe but the left lower lobe aeration is better than previous. There is narrowing of the left lower lobe bronchus on image 46/7 which could be inflammatory or due to tumor, but the bronchus is not completely occluded as it was on 08/29/2016. Biapical pleuroparenchymal scarring. Upper Abdomen: Postoperative findings from prior cholecystectomy. Musculoskeletal: Mild thoracic spondylosis. Review of the MIP images  confirms the above findings. IMPRESSION: 1. Acute pulmonary embolus in the right lower lobe pulmonary artery extending into segmental vessels. Overall clot burden is low-to-moderate. Right ventricular to left ventricular ratio is normal, without compelling signs of right heart strain at this time. 2. Patchy ground-glass opacities in both lungs possibly from low-level edema. 3. Improved aeration in the left lower lobe although there is still considerable atelectasis and airspace opacity. There is also some bandlike opacity in the left upper lobe which is increased. Much of this may be due to radiation therapy. Previously occluded left lower lobe bronchus is now severely stenotic rather than occluded, as on image 46/7, this could be from inflammation or tumor. 4. Small left pleural effusion and small pericardial effusion, both similar to prior. 5. Aortic  and branch vessel atherosclerosis. Radiology assistant personnel have been notified to put me in telephone contact with the referring physician or the referring physician's clinical representative in order to discuss these findings. Once this communication is established I will issue an addendum to this report for documentation purposes. Electronically Signed: By: Van Clines M.D. 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  PLAN:    1. 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 Bronchoscopy and biopsy results noted with no obvious evidence of recurrence either. Lower lobe collapse appears to be secondary to mucous plugging and radiation changes. Will repeat CT scan in December or January.  2. Shortness of breath/cough: Likely secondary to collapsed lung. No obvious evidence of malignancy.  Patient with no improvement despite 10 days of Levaquin and extended prednisone taper. She was given 4 mg of Decadron daily today.   3. Poor appetite: Likely multifactorial, Decadron as above.  4. Dysphagia: Resolved. 5. Hypokalemia: Potassium now within normal limits continue oral supplementation as prescribed. 6. Peripheral neuropathy: Patient is no longer taking gabapentin. 7. Anemia: Significantly improved, monitor. 8. Weakness and fatigue: Unclear etiology, but likely multifactorial. Thyroid studies pending at time of dictation. 9. Disposition: Return to clinic in 1 month for further evaluation.  Approximately 30 minutes was spent in discussion of which greater than 50% was consultation.  Patient expressed understanding and was in agreement with this plan. She also understands that She can call clinic at any time with any questions, concerns, or complaints.    Lloyd Huger, MD   12/15/2016 9:11 AM

## 2016-12-19 ENCOUNTER — Encounter: Payer: Self-pay | Admitting: Oncology

## 2016-12-19 ENCOUNTER — Ambulatory Visit: Payer: Managed Care, Other (non HMO) | Admitting: Unknown Physician Specialty

## 2016-12-23 ENCOUNTER — Telehealth: Payer: Self-pay | Admitting: Unknown Physician Specialty

## 2016-12-23 NOTE — Telephone Encounter (Signed)
Called and made sure that patient's husband knew about the appointment being changed to Thursday due to weather. He stated that was fine.

## 2016-12-23 NOTE — Telephone Encounter (Signed)
I didn't. I think Christan handled Cheryl's schedule.

## 2016-12-23 NOTE — Telephone Encounter (Signed)
[name], did you call this patient regarding her appointment tomorrow?

## 2016-12-24 ENCOUNTER — Ambulatory Visit: Payer: Self-pay | Admitting: Unknown Physician Specialty

## 2016-12-25 ENCOUNTER — Ambulatory Visit: Payer: Self-pay | Admitting: Unknown Physician Specialty

## 2016-12-29 ENCOUNTER — Encounter: Payer: Self-pay | Admitting: Unknown Physician Specialty

## 2016-12-29 ENCOUNTER — Ambulatory Visit (INDEPENDENT_AMBULATORY_CARE_PROVIDER_SITE_OTHER): Payer: Managed Care, Other (non HMO) | Admitting: Unknown Physician Specialty

## 2016-12-29 VITALS — BP 90/62 | HR 111 | Temp 98.2°F | Ht 63.1 in | Wt 139.8 lb

## 2016-12-29 DIAGNOSIS — R000 Tachycardia, unspecified: Secondary | ICD-10-CM

## 2016-12-29 DIAGNOSIS — Z23 Encounter for immunization: Secondary | ICD-10-CM | POA: Diagnosis not present

## 2016-12-29 DIAGNOSIS — R634 Abnormal weight loss: Secondary | ICD-10-CM | POA: Diagnosis not present

## 2016-12-29 DIAGNOSIS — E039 Hypothyroidism, unspecified: Secondary | ICD-10-CM

## 2016-12-29 DIAGNOSIS — R531 Weakness: Secondary | ICD-10-CM | POA: Diagnosis not present

## 2016-12-29 DIAGNOSIS — E538 Deficiency of other specified B group vitamins: Secondary | ICD-10-CM

## 2016-12-29 DIAGNOSIS — J9811 Atelectasis: Secondary | ICD-10-CM

## 2016-12-29 DIAGNOSIS — H9201 Otalgia, right ear: Secondary | ICD-10-CM | POA: Diagnosis not present

## 2016-12-29 DIAGNOSIS — R0902 Hypoxemia: Secondary | ICD-10-CM | POA: Diagnosis not present

## 2016-12-29 MED ORDER — CYANOCOBALAMIN 1000 MCG/ML IJ SOLN
1000.0000 ug | INTRAMUSCULAR | Status: DC
  Administered 2016-12-29: 1000 ug via INTRAMUSCULAR

## 2016-12-29 MED ORDER — FLUTICASONE FUROATE-VILANTEROL 200-25 MCG/INH IN AEPB: 1.0000 | INHALATION_SPRAY | Freq: Every day | RESPIRATORY_TRACT | 12 refills | Status: DC

## 2016-12-29 NOTE — Assessment & Plan Note (Addendum)
Will change oxygen to prn and at night

## 2016-12-29 NOTE — Assessment & Plan Note (Signed)
Improved with heart rate 100-110.  OK to start PT

## 2016-12-29 NOTE — Assessment & Plan Note (Signed)
Decreased dose of synthroid.  Check levels in 1 month

## 2016-12-29 NOTE — Assessment & Plan Note (Signed)
Start PT 

## 2016-12-29 NOTE — Patient Instructions (Signed)

## 2016-12-29 NOTE — Addendum Note (Signed)
Addended by: Moss Mc on: 12/29/2016 04:35 PM   Modules accepted: Orders

## 2016-12-29 NOTE — Assessment & Plan Note (Signed)
Work on diet

## 2016-12-29 NOTE — Progress Notes (Signed)
BP 90/62 (BP Location: Left Arm, Patient Position: Sitting, Cuff Size: Normal)   Pulse (!) 111   Temp 98.2 F (36.8 C)   Ht 5' 3.1" (1.603 m)   Wt 139 lb 12.8 oz (63.4 kg)   LMP  (LMP Unknown)   SpO2 96%   BMI 24.69 kg/m    Subjective:    Patient ID: Jill Gross, female    DOB: [birth year], 61 y.o.   MRN: [id number]  HPI: Jill Gross is a 61 y.o. female  Chief Complaint  Patient presents with  . Tachycardia    2 week f/up, states BP has been running a little low  . Immunizations    pt wants to talk with provider about getting flu vaccine  . Injections    pt states she usually gives herself her b12 inhection but would like for [place] to do it   Tachycardia/hypotension Pt here to f/u from her tachycardia.  She is still SOB when she gets up to walk and describes herself as "very weak."    Hypothyroid A little high while on 200 mcgs.  Decreased to 135 mcgs when I saw her last     Hypoxia 91-94% on room air.  She would like to come off her oxygen.  Has Left lower lobe collapse.  Reviewed oncology notes with not evidence of cancer recurrence.    Relevant past medical, surgical, family and social history reviewed and updated as indicated. Interim medical history since our last visit reviewed. Allergies and medications reviewed and updated.  Review of Systems  Respiratory:       Starts wheezing and then she coughs    Per HPI unless specifically indicated above     Objective:    BP 90/62 (BP Location: Left Arm, Patient Position: Sitting, Cuff Size: Normal)   Pulse (!) 111   Temp 98.2 F (36.8 C)   Ht 5' 3.1" (1.603 m)   Wt 139 lb 12.8 oz (63.4 kg)   LMP  (LMP Unknown)   SpO2 96%   BMI 24.69 kg/m   Wt Readings from Last 3 Encounters:  12/29/16 139 lb 12.8 oz (63.4 kg)  12/15/16 137 lb 0.3 oz (62.1 kg)  12/09/16 142 lb 3.2 oz (64.5 kg)    Physical Exam  Constitutional: She is oriented to person, place, and time. She appears well-developed and well-nourished. No  distress.  HENT:  Head: Normocephalic and atraumatic.  Right ear sore.  Wax was removed  Eyes: Conjunctivae and lids are normal. Right eye exhibits no discharge. Left eye exhibits no discharge. No scleral icterus.  Neck: Normal range of motion. Neck supple. No JVD present. Carotid bruit is not present.  Cardiovascular: Normal rate, regular rhythm and normal heart sounds.   Pulmonary/Chest: Effort normal. She has decreased breath sounds in the left middle field and the left lower field.  Abdominal: Normal appearance. There is no splenomegaly or hepatomegaly.  Musculoskeletal: Normal range of motion.  Neurological: She is alert and oriented to person, place, and time.  Skin: Skin is warm, dry and intact. No rash noted. No pallor.  Psychiatric: She has a normal mood and affect. Her behavior is normal. Judgment and thought content normal.    Results for orders placed or performed in visit on 12/15/16  CBC with Differential  Result Value Ref Range   WBC 5.5 3.6 - 11.0 K/uL   RBC 3.15 (L) 3.80 - 5.20 MIL/uL   Hemoglobin 9.6 (L) 12.0 - 16.0 g/dL  HCT 28.3 (L) 35.0 - 47.0 %   MCV 90.0 80.0 - 100.0 fL   MCH 30.5 26.0 - 34.0 pg   MCHC 33.9 32.0 - 36.0 g/dL   RDW 16.5 (H) 11.5 - 14.5 %   Platelets 209 150 - 440 K/uL   Neutrophils Relative % 62 %   Neutro Abs 3.5 1.4 - 6.5 K/uL   Lymphocytes Relative 25 %   Lymphs Abs 1.4 1.0 - 3.6 K/uL   Monocytes Relative 11 %   Monocytes Absolute 0.6 0.2 - 0.9 K/uL   Eosinophils Relative 2 %   Eosinophils Absolute 0.1 0 - 0.7 K/uL   Basophils Relative 0 %   Basophils Absolute 0.0 0 - 0.1 K/uL  Comprehensive metabolic panel  Result Value Ref Range   Sodium 136 135 - 145 mmol/L   Potassium 3.0 (L) 3.5 - 5.1 mmol/L   Chloride 103 101 - 111 mmol/L   CO2 24 22 - 32 mmol/L   Glucose, Bld 111 (H) 65 - 99 mg/dL   BUN 6 6 - 20 mg/dL   Creatinine, Ser 0.62 0.44 - 1.00 mg/dL   Calcium 9.1 8.9 - 10.3 mg/dL   Total Protein 7.2 6.5 - 8.1 g/dL   Albumin 3.5  3.5 - 5.0 g/dL   AST 31 15 - 41 U/L   ALT 35 14 - 54 U/L   Alkaline Phosphatase 63 38 - 126 U/L   Total Bilirubin 0.6 0.3 - 1.2 mg/dL   GFR calc non Af Amer >60 >60 mL/min   GFR calc Af Amer >60 >60 mL/min   Anion gap 9 5 - 15  -    Assessment & Plan:   Problem List Items Addressed This Visit      Unprioritized   Abnormal weight loss    Work on diet      Collapse of left lung    Will rx Breo      Ear pain, right   Relevant Orders   Ambulatory referral to ENT   Hypothyroidism    Decreased dose of synthroid.  Check levels in 1 month      Hypoxia    Will change oxygen to prn and at night      Tachycardia    Improved with heart rate 100-110.  OK to start PT      Weakness    Start PT       Other Visit Diagnoses    B12 deficiency    -  Primary   Relevant Medications   cyanocobalamin ((VITAMIN B-12)) injection 1,000 mcg       Follow up plan: Return in about 4 weeks (around 01/26/2017).

## 2016-12-29 NOTE — Assessment & Plan Note (Signed)
Will rx [facility]

## 2016-12-30 ENCOUNTER — Telehealth: Payer: Self-pay | Admitting: Unknown Physician Specialty

## 2016-12-30 NOTE — Telephone Encounter (Signed)
Pt would like to have orders for PT to go to [facility].

## 2016-12-31 NOTE — Telephone Encounter (Signed)
[facility] to see what they would need from [place] to get this started for the patient. While on the phone with them, they stated that the patient is already established with them and that they just need an order to re-start PT. They need this order faxed to them at [phone].

## 2016-12-31 NOTE — Telephone Encounter (Signed)
Wrote out order on prescription pad. Will get provider signature and fax to [facility].

## 2016-12-31 NOTE — Telephone Encounter (Signed)
Order faxed to [facility].

## 2016-12-31 NOTE — Telephone Encounter (Signed)
I don't know what we need to send but ok to start PT

## 2017-01-02 ENCOUNTER — Telehealth: Payer: Self-pay

## 2017-01-02 NOTE — Telephone Encounter (Signed)
Received a call from Amy with [facility] earlier. She stated that they will no longer be able to take this patient because as of January 1st, they do not take her insurance. I called and let the patient know this. The patient stated that her husband handled all of this so she asked for me to talk to him. So I also let the patient's husband know what Amy told me. He stated that they would prefer to have in home PT if possible. Will try to see where we can send this patient for in home PT.

## 2017-01-05 NOTE — Telephone Encounter (Signed)
Called and spoke with Santiago Glad at [facility] and she stated that they do not take [employer]. She stated that she is pretty sure [facility] does not take [employer] either. Tried calling [facility] and was on hold for over 5 minutes. Will try to call again later.

## 2017-01-07 NOTE — Telephone Encounter (Signed)
[facility] and they do not take [employer]. Will try to call [facility] again now.

## 2017-01-07 NOTE — Telephone Encounter (Signed)
Order, Ov note, and demographics faxed to [facility].

## 2017-01-07 NOTE — Telephone Encounter (Signed)
Called and spoke with Jill Gross and Jill Gross and she stated that [place] insurance is accepted in the patient's zip code area. She stated that they need the order, OV note, and demographics faxed to [phone]. Will call patient and let her know.

## 2017-01-07 NOTE — Telephone Encounter (Signed)
Called and let patient and her husband know that [facility] takes [employer]. I let them know that I was going to fax the order to them and they should be getting a call from them. I asked if they do not hear from [facility] within a week or two, then to please give me a call to figure out what is going on.

## 2017-01-08 ENCOUNTER — Encounter: Payer: Self-pay | Admitting: Radiation Oncology

## 2017-01-08 ENCOUNTER — Ambulatory Visit
Admission: RE | Admit: 2017-01-08 | Discharge: 2017-01-08 | Disposition: A | Discharge status: Home / Self Care | Payer: Managed Care, Other (non HMO) | Source: Ambulatory Visit | Attending: Radiation Oncology | Admitting: Radiation Oncology

## 2017-01-08 VITALS — BP 106/71 | HR 110 | Temp 96.2°F | Wt 141.5 lb

## 2017-01-08 DIAGNOSIS — Z7901 Long term (current) use of anticoagulants: Secondary | ICD-10-CM | POA: Diagnosis not present

## 2017-01-08 DIAGNOSIS — Z86711 Personal history of pulmonary embolism: Secondary | ICD-10-CM | POA: Diagnosis not present

## 2017-01-08 DIAGNOSIS — Z9221 Personal history of antineoplastic chemotherapy: Secondary | ICD-10-CM | POA: Insufficient documentation

## 2017-01-08 DIAGNOSIS — Z85118 Personal history of other malignant neoplasm of bronchus and lung: Secondary | ICD-10-CM | POA: Insufficient documentation

## 2017-01-08 DIAGNOSIS — Z923 Personal history of irradiation: Secondary | ICD-10-CM | POA: Diagnosis not present

## 2017-01-08 DIAGNOSIS — C3432 Malignant neoplasm of lower lobe, left bronchus or lung: Secondary | ICD-10-CM

## 2017-01-08 NOTE — Progress Notes (Signed)
Radiation Oncology Follow up Note  Name: Jill Gross   Date:   01/08/2017 MRN:  [id number] DOB: Apr 10, 1956    This 61 y.o. female presents to the clinic today for 5 month follow-up status post whole brain radiation as well as radiation therapy to her chest with concurrent chemotherapy for small cell lung cancer.  REFERRING PROVIDER: [name], NP  HPI: Patient is a 60 year old female now out 9 months status post concurrent chemoradiation to her chest as well as prophylactic cranial irradiation 5 months prior for limited stage small cell lung cancer. She is seen today in routine follow-up and is doing fairly well.. She has had recently a repeat CT scan showing no evidence of disease with some changes consistent with radiation scarring. She did have a left lower lobe clot collapse bronchoscopy and biopsy were negative. She also did develop a pulmonary embolism and is currently on Eliquis . She specifically denies cough hemoptysis or chest tightness.  COMPLICATIONS OF TREATMENT: none  FOLLOW UP COMPLIANCE: keeps appointments   PHYSICAL EXAM:  BP 106/71   Pulse (!) 110   Temp (!) 96.2 F (35.7 C)   Wt 141 lb 8.6 oz (64.2 kg)   LMP  (LMP Unknown)   BMI 24.99 kg/m  Well-developed well-nourished patient in NAD. HEENT reveals PERLA, EOMI, discs not visualized.  Oral cavity is clear. No oral mucosal lesions are identified. Neck is clear without evidence of cervical or supraclavicular adenopathy. Lungs are clear to A&P. Cardiac examination is essentially unremarkable with regular rate and rhythm without murmur rub or thrill. Abdomen is benign with no organomegaly or masses noted. Motor sensory and DTR levels are equal and symmetric in the upper and lower extremities. Cranial nerves II through XII are grossly intact. Proprioception is intact. No peripheral adenopathy or edema is identified. No motor or sensory levels are noted. Crude visual fields are within normal range.  RADIOLOGY RESULTS: CT  scans are reviewed and compared with prior studies  PLAN: Present time patient is doing well with no evidence of disease. I'm please were overall progress. She continues close follow-up care with medical oncology. I have asked to see her back in 6 months for follow-up. I will then go to once your follow-up visits. Patient and husband know to call with any concerns.  I would like to take this opportunity to thank you for allowing me to participate in the care of your patient.Armstead Peaks., MD

## 2017-01-09 ENCOUNTER — Telehealth: Payer: Self-pay

## 2017-01-09 NOTE — Telephone Encounter (Signed)
See phone note from 01/02/17. After faxing information to [facility], they send me a fax the next day stating that they do not have home health in the patient's zip code area. Called [facility] to see if they take [employer]. They are faxing me a form to fill out and send back to see if they can take this patient.

## 2017-01-12 ENCOUNTER — Telehealth: Payer: Self-pay | Admitting: Unknown Physician Specialty

## 2017-01-12 NOTE — Telephone Encounter (Signed)
Will with [facility] called and stated that they weren't in network with the pts insurance.

## 2017-01-12 NOTE — Telephone Encounter (Signed)
Called and spoke with patient's husband. I let him know that [facility] sent me a fax back stating that they could not take this patient. I told him that I was trying another place, [facility], to see if they took [employer]. I told the patient's husband that I would fax the form today and I would let them know as soon as I heard something. Patient's husband stated that was fine and thanked me for letting them know.

## 2017-01-13 NOTE — Telephone Encounter (Signed)
Please see other phone note

## 2017-01-13 NOTE — Telephone Encounter (Signed)
See other phone note. Received a call from Will with [facility]. He stated that they are not in network with patient's insurance.

## 2017-01-16 NOTE — Telephone Encounter (Signed)
Referral form faxed to [facility]. Asked for them to please let us know if they can see this patient or not.

## 2017-01-16 NOTE — Telephone Encounter (Signed)
Filled out form for [facility]. Will fax to them to see if they can take this patient.

## 2017-01-19 ENCOUNTER — Other Ambulatory Visit: Payer: Self-pay | Admitting: Unknown Physician Specialty

## 2017-01-19 NOTE — Telephone Encounter (Signed)
Received a fax back from [place]. They are not able to take this patient as they are not in network.

## 2017-01-20 ENCOUNTER — Telehealth: Payer: Self-pay | Admitting: Unknown Physician Specialty

## 2017-01-20 NOTE — Telephone Encounter (Signed)
Called and spoke with patient's husband. Patient gave permission to do this. I let him know that we have tried 6 different places for the in home PT and none of them took [employer]. I asked if they could call the insurance company and see if they would tell them who takes [employer] in the area or if they could help [place] out any. Patient's husband stated that he would do this and I asked for him to please let me know if he finds anything out.

## 2017-01-20 NOTE — Telephone Encounter (Signed)
Patient called to see if Malachy Mood had received a fax request from [facility] regarding the patients Ambien yesterday.  She is needing this refilled as she plans to pick up her meds tomorrow at [facility].  Thanks

## 2017-01-20 NOTE — Telephone Encounter (Signed)
RX written yesterday, faxed to [facility]. Patient notified.

## 2017-01-21 ENCOUNTER — Telehealth: Payer: Self-pay | Admitting: Unknown Physician Specialty

## 2017-01-21 NOTE — Telephone Encounter (Signed)
See phone note from 01/21/17 for further documentation.

## 2017-01-21 NOTE — Telephone Encounter (Signed)
Patient's husband returned my call. He stated that he called [employer] and they looked and did not see anyone covered in this area either for home PT. He asked me if I had tried to call [facility] who is a 3rd party with [facility] that deals with home health. He provided me with 2 phone numbers, [phone] and [phone], to call and speak with someone at [facility] about a home health PT referral. I told the patient's husband that I would call [facility] first thing in the morning to see what we can and need to do for the patient.

## 2017-01-21 NOTE — Telephone Encounter (Signed)
Called and left patient's husband a VM asking for him to please return my call.

## 2017-01-22 NOTE — Telephone Encounter (Signed)
Called and spoke with Hassan Rowan at [facility]. I gave her all of the information requested regarding the patient, like demographics,diagnosis, and insurance information. Hassan Rowan stated that the estimated started date of care is 01/27/17. Hassan Rowan stated that the order would be assigned to a provider and then the provider would call the patient. The provider will give the patient an ETA within a 1 to 2 hour window. Will call patient and her husband and let them know.

## 2017-01-22 NOTE — Telephone Encounter (Signed)
Called and let patient's husband know that I spoke with Hassan Rowan at [facility]. I told him that we got the referral for home PT set up and that the estimated start date of care was 01/27/17. I told him that a provider would be calling him either the day before or the day of to give them an ETA. I asked for them to please give me a call back if they have any questions or concerns.

## 2017-01-23 ENCOUNTER — Telehealth: Payer: Self-pay

## 2017-01-23 NOTE — Telephone Encounter (Signed)
Lori from [facility] wanted to call and give me an update. She stated that they have exhausted in network providers for the patient but are now going to try out of network providers.

## 2017-01-25 NOTE — Progress Notes (Signed)
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  INTERVAL HISTORY: Patient returns to clinic today for further evaluation. Her performance status is improving. She continues to have chronic shortness of breath, but this is also improved. She has a fair appetite, but no further weight loss.  She also has continued neuropathy particularly in her fingertips. She has no other neurologic complaints. She denies any recent fevers. She denies any chest pain or hemoptysis. She denies any nausea, vomiting, constipation, or diarrhea. She has no urinary complaints. Patient feels generally terrible, but offers no further specific complaints.   REVIEW OF SYSTEMS:   Review of Systems  Constitutional: Positive for malaise/fatigue and weight loss. Negative for fever.  HENT: Negative for sore throat.   Eyes: Negative.   Respiratory: Positive for cough and shortness of breath. Negative for hemoptysis.   Cardiovascular: Negative.  Negative for chest pain.  Gastrointestinal: Negative for nausea and vomiting.  Genitourinary: Negative.   Musculoskeletal: Negative.   Skin: Negative.   Neurological: Positive for sensory change and weakness. Negative for dizziness and tingling.  Psychiatric/Behavioral: Positive for depression.    As per HPI. Otherwise, a complete review of systems is negative.  PAST MEDICAL HISTORY: Past Medical History:  Diagnosis Date  . Allergy   . Anemia    pernicious  . Anxiety   . Arthritis   . Atrophic vaginitis   . COPD (chronic obstructive pulmonary disease) ([place])   . Depression   . Diverticulosis   . Dyspnea   . Fatigue   . GERD  (gastroesophageal reflux disease)   . History of chemotherapy 04/2016  . Hyperlipidemia   . Hypothyroid   . Insomnia   . Low back pain   . Lumbago   . Neuropathy due to chemotherapeutic drug ([place])   . Osteopenia   . Small cell carcinoma of left lung ([place]) 01/2016   rad + chemo tx's  . Tobacco use   . Vitamin B12 deficiency     PAST SURGICAL HISTORY: Past Surgical History:  Procedure Laterality Date  . ABDOMINAL HYSTERECTOMY    . ANKLE SURGERY     x4  . CARPAL TUNNEL RELEASE  April 2015  . CHOLECYSTECTOMY    . ENDOBRONCHIAL ULTRASOUND N/A 01/07/2016   Procedure: ENDOBRONCHIAL ULTRASOUND;  Surgeon: Vilinda Boehringer, MD;  Location: [facility] ORS;  Service: Cardiopulmonary;  Laterality: N/A;  . ENDOBRONCHIAL ULTRASOUND N/A 09/16/2016   Procedure: ENDOBRONCHIAL ULTRASOUND;  Surgeon: Vilinda Boehringer, MD;  Location: [facility] ORS;  Service: Cardiopulmonary;  Laterality: N/A;  . HERNIA REPAIR    . PORTACATH PLACEMENT Right 01/23/2016   Procedure: INSERTION PORT-A-CATH;  Surgeon: [name], MD;  Location: [facility] ORS;  Service: General;  Laterality: Right;    FAMILY HISTORY Family History  Problem Relation Age of Onset  . Emphysema Mother   . Diabetes Father        ADVANCED DIRECTIVES:    HEALTH MAINTENANCE: Social History  Substance Use Topics  . Smoking status: Former Smoker    Packs/day: 1.00    Years: 30.00    Types: Cigarettes    Quit date: 01/11/2016  . Smokeless tobacco: Never Used  . Alcohol use No  Allergies  Allergen Reactions  . Macrobid [Nitrofurantoin] Anaphylaxis  . Skelaxin [Metaxalone] Anaphylaxis  . Effexor [Venlafaxine]   . Hctz [Hydrochlorothiazide] Other (See Comments)    cramps  . Neurontin [Gabapentin]   . Paxil [Paroxetine Hcl]   . Pravachol [Pravastatin Sodium] Other (See Comments)    aching  . Prozac [Fluoxetine Hcl]   . Sulfur Other (See Comments)    "blisters in mouth"  . Wellbutrin [Bupropion] Itching  . Zoloft [Sertraline Hcl]     Current  Outpatient Prescriptions  Medication Sig Dispense Refill  . amitriptyline (ELAVIL) 10 MG tablet Take 2 tablets (20 mg total) by mouth at bedtime. 60 tablet 12  . cyanocobalamin (,VITAMIN B-12,) 1000 MCG/ML injection Inject 1 mL (1,000 mcg total) into the muscle once. (Patient taking differently: Inject 1,000 mcg into the muscle every 30 (thirty) days. ) 10 mL 1  . ELIQUIS 5 MG TABS tablet TAKE ONE TABLET BY MOUTH TWICE DAILY. 60 tablet 0  . feeding supplement, ENSURE ENLIVE, (ENSURE ENLIVE) LIQD Take 237 mLs by mouth 2 (two) times daily between meals. 237 mL 12  . fluticasone furoate-vilanterol (BREO ELLIPTA) 200-25 MCG/INH AEPB Inhale 1 puff into the lungs daily. 1 each 12  . ipratropium-albuterol (DUONEB) 0.5-2.5 (3) MG/3ML SOLN Take 3 mLs by nebulization every 6 (six) hours as needed. 360 mL 0  . levothyroxine (SYNTHROID, LEVOTHROID) 137 MCG tablet Take 137 mcg by mouth daily before breakfast.    . ranitidine (ZANTAC) 150 MG tablet Take 150 mg by mouth 2 (two) times daily as needed for heartburn.    . simvastatin (ZOCOR) 10 MG tablet Take 1 tablet (10 mg total) by mouth daily at 6 PM. 30 tablet 6  . zolpidem (AMBIEN) 10 MG tablet TAKE ONE TABLET AT BEDTIME. 30 tablet 2  . ALPRAZolam (XANAX) 0.25 MG tablet Take 1 tablet (0.25 mg total) by mouth 2 (two) times daily as needed for anxiety. 60 tablet 1  . gabapentin (NEURONTIN) 300 MG capsule Take 1 capsule (300 mg total) by mouth 3 (three) times daily. 90 capsule 3   Current Facility-Administered Medications  Medication Dose Route Frequency Provider Last Rate Last Dose  . [START ON 02/24/2017] cyanocobalamin ((VITAMIN B-12)) injection 1,000 mcg  1,000 mcg Intramuscular Q30 days [name], NP        OBJECTIVE: Vitals:   01/26/17 1513  BP: 135/77  Pulse: (!) 104  Resp: 18  Temp: (!) 96.4 F (35.8 C)     Body mass index is 24.66 kg/m.    ECOG FS:1 - Symptomatic but completely ambulatory  General: No acute distress. Sitting in  wheelchair Eyes: Pink conjunctiva, anicteric sclera. HEENT: Oropharynx clear without erythema or exudate. Lungs: Clear to auscultation bilaterally. Heart: Tachycardia. No rubs, murmurs, or gallops. Musculoskeletal: No edema, cyanosis, or clubbing. Neuro: Alert, answering all questions appropriately. Cranial nerves grossly intact. Skin: No rashes or petechiae noted. Poor skin turgor. Psych: Normal affect.   LAB RESULTS:  Lab Results  Component Value Date   NA 139 01/26/2017   K 3.3 (L) 01/26/2017   CL 102 01/26/2017   CO2 29 01/26/2017   GLUCOSE 100 (H) 01/26/2017   BUN 6 01/26/2017   CREATININE 0.69 01/26/2017   CALCIUM 8.7 (L) 01/26/2017   PROT 6.4 (L) 01/26/2017   ALBUMIN 3.1 (L) 01/26/2017   AST 43 (H) 01/26/2017   ALT 24 01/26/2017   ALKPHOS 61 01/26/2017   BILITOT 0.2 (L) 01/26/2017   GFRNONAA >60 01/26/2017   GFRAA >60 01/26/2017  Lab Results  Component Value Date   WBC 4.9 01/26/2017   NEUTROABS 2.6 01/26/2017   HGB 9.8 (L) 01/26/2017   HCT 29.0 (L) 01/26/2017   MCV 88.7 01/26/2017   PLT 220 01/26/2017     STUDIES: No results found.  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  PLAN:    1. 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 Left lower lobe collapse improved. Previously, bronchoscopy and biopsy results noted with no obvious evidence of recurrence either. No intervention is needed at this time. Patient will have restaging CT scan at the end of March 19 follow-up 1-2 days later to discuss the results.  2. Pulmonary embolism: Likely secondary to increased sedentary lifestyle. Continue Eliquis for a total of 6 months completing in June 2018.    3. Poor appetite: Improving, monitor.  4. Hypokalemia: Continue oral supplementation as prescribed. 5. Peripheral  neuropathy: Patient is no longer taking gabapentin. 6. Anemia: Patient's hemoglobin remains decreased, but stable. Monitor. 7. Thyroid panel: Previously T4 was nearly undetectable with elevated TSH. Patient now appears to be slightly overtreated. Currently taking 135 g Synthroid. Will defer to primary care for adjustment of medications.    Patient expressed understanding and was in agreement with this plan. She also understands that She can call clinic at any time with any questions, concerns, or complaints.    Lloyd Huger, MD   01/28/2017 8:37 AM

## 2017-01-26 ENCOUNTER — Inpatient Hospital Stay: Payer: Managed Care, Other (non HMO)

## 2017-01-26 ENCOUNTER — Inpatient Hospital Stay: Payer: Managed Care, Other (non HMO) | Attending: Oncology

## 2017-01-26 ENCOUNTER — Inpatient Hospital Stay (HOSPITAL_BASED_OUTPATIENT_CLINIC_OR_DEPARTMENT_OTHER): Payer: Managed Care, Other (non HMO) | Admitting: Oncology

## 2017-01-26 VITALS — BP 135/77 | HR 104 | Temp 96.4°F | Resp 18 | Wt 139.7 lb

## 2017-01-26 DIAGNOSIS — E785 Hyperlipidemia, unspecified: Secondary | ICD-10-CM | POA: Insufficient documentation

## 2017-01-26 DIAGNOSIS — Z923 Personal history of irradiation: Secondary | ICD-10-CM | POA: Insufficient documentation

## 2017-01-26 DIAGNOSIS — R0602 Shortness of breath: Secondary | ICD-10-CM | POA: Insufficient documentation

## 2017-01-26 DIAGNOSIS — E876 Hypokalemia: Secondary | ICD-10-CM | POA: Diagnosis not present

## 2017-01-26 DIAGNOSIS — M545 Low back pain: Secondary | ICD-10-CM | POA: Diagnosis not present

## 2017-01-26 DIAGNOSIS — R630 Anorexia: Secondary | ICD-10-CM | POA: Diagnosis not present

## 2017-01-26 DIAGNOSIS — M129 Arthropathy, unspecified: Secondary | ICD-10-CM | POA: Diagnosis not present

## 2017-01-26 DIAGNOSIS — Z86711 Personal history of pulmonary embolism: Secondary | ICD-10-CM | POA: Diagnosis not present

## 2017-01-26 DIAGNOSIS — R634 Abnormal weight loss: Secondary | ICD-10-CM

## 2017-01-26 DIAGNOSIS — K219 Gastro-esophageal reflux disease without esophagitis: Secondary | ICD-10-CM | POA: Insufficient documentation

## 2017-01-26 DIAGNOSIS — F329 Major depressive disorder, single episode, unspecified: Secondary | ICD-10-CM | POA: Diagnosis not present

## 2017-01-26 DIAGNOSIS — J449 Chronic obstructive pulmonary disease, unspecified: Secondary | ICD-10-CM

## 2017-01-26 DIAGNOSIS — M8580 Other specified disorders of bone density and structure, unspecified site: Secondary | ICD-10-CM | POA: Insufficient documentation

## 2017-01-26 DIAGNOSIS — F419 Anxiety disorder, unspecified: Secondary | ICD-10-CM | POA: Diagnosis not present

## 2017-01-26 DIAGNOSIS — Z85118 Personal history of other malignant neoplasm of bronchus and lung: Secondary | ICD-10-CM | POA: Insufficient documentation

## 2017-01-26 DIAGNOSIS — R5383 Other fatigue: Secondary | ICD-10-CM | POA: Insufficient documentation

## 2017-01-26 DIAGNOSIS — D510 Vitamin B12 deficiency anemia due to intrinsic factor deficiency: Secondary | ICD-10-CM | POA: Diagnosis not present

## 2017-01-26 DIAGNOSIS — R531 Weakness: Secondary | ICD-10-CM

## 2017-01-26 DIAGNOSIS — E039 Hypothyroidism, unspecified: Secondary | ICD-10-CM

## 2017-01-26 DIAGNOSIS — Z8719 Personal history of other diseases of the digestive system: Secondary | ICD-10-CM | POA: Diagnosis not present

## 2017-01-26 DIAGNOSIS — Z87891 Personal history of nicotine dependence: Secondary | ICD-10-CM

## 2017-01-26 DIAGNOSIS — C3432 Malignant neoplasm of lower lobe, left bronchus or lung: Secondary | ICD-10-CM

## 2017-01-26 DIAGNOSIS — G629 Polyneuropathy, unspecified: Secondary | ICD-10-CM

## 2017-01-26 DIAGNOSIS — F17210 Nicotine dependence, cigarettes, uncomplicated: Secondary | ICD-10-CM | POA: Diagnosis not present

## 2017-01-26 DIAGNOSIS — G4700 Insomnia, unspecified: Secondary | ICD-10-CM | POA: Diagnosis not present

## 2017-01-26 DIAGNOSIS — Z9221 Personal history of antineoplastic chemotherapy: Secondary | ICD-10-CM | POA: Insufficient documentation

## 2017-01-26 DIAGNOSIS — Z79899 Other long term (current) drug therapy: Secondary | ICD-10-CM

## 2017-01-26 DIAGNOSIS — R918 Other nonspecific abnormal finding of lung field: Secondary | ICD-10-CM

## 2017-01-26 DIAGNOSIS — Z7901 Long term (current) use of anticoagulants: Secondary | ICD-10-CM

## 2017-01-26 LAB — COMPREHENSIVE METABOLIC PANEL
ALBUMIN: 3.1 g/dL — AB (ref 3.5–5.0)
ALT: 24 U/L (ref 14–54)
ANION GAP: 8 (ref 5–15)
AST: 43 U/L — ABNORMAL HIGH (ref 15–41)
Alkaline Phosphatase: 61 U/L (ref 38–126)
BUN: 6 mg/dL (ref 6–20)
CO2: 29 mmol/L (ref 22–32)
Calcium: 8.7 mg/dL — ABNORMAL LOW (ref 8.9–10.3)
Chloride: 102 mmol/L (ref 101–111)
Creatinine, Ser: 0.69 mg/dL (ref 0.44–1.00)
GFR calc non Af Amer: >60 mL/min (ref >60)
Glucose, Bld: 100 mg/dL — ABNORMAL HIGH (ref 65–99)
POTASSIUM: 3.3 mmol/L — AB (ref 3.5–5.1)
SODIUM: 139 mmol/L (ref 135–145)
Total Bilirubin: 0.2 mg/dL — ABNORMAL LOW (ref 0.3–1.2)
Total Protein: 6.4 g/dL — ABNORMAL LOW (ref 6.5–8.1)

## 2017-01-26 LAB — CBC WITH DIFFERENTIAL/PLATELET
Basophils Absolute: 0 10*3/uL (ref 0–0.1)
Basophils Relative: 0 %
EOS ABS: 0.1 10*3/uL (ref 0–0.7)
EOS PCT: 2 %
HCT: 29 % — ABNORMAL LOW (ref 35.0–47.0)
Hemoglobin: 9.8 g/dL — ABNORMAL LOW (ref 12.0–16.0)
Lymphocytes Relative: 34 %
Lymphs Abs: 1.7 10*3/uL (ref 1.0–3.6)
MCH: 30 pg (ref 26.0–34.0)
MCHC: 33.8 g/dL (ref 32.0–36.0)
MCV: 88.7 fL (ref 80.0–100.0)
MONO ABS: 0.5 10*3/uL (ref 0.2–0.9)
MONOS PCT: 10 %
Neutro Abs: 2.6 10*3/uL (ref 1.4–6.5)
Neutrophils Relative %: 54 %
Platelets: 220 10*3/uL (ref 150–440)
RBC: 3.27 MIL/uL — ABNORMAL LOW (ref 3.80–5.20)
RDW: 15.1 % — AB (ref 11.5–14.5)
WBC: 4.9 10*3/uL (ref 3.6–11.0)

## 2017-01-26 MED ORDER — SODIUM CHLORIDE 0.9% FLUSH
10.0000 mL | Freq: Once | INTRAVENOUS | Status: AC
  Administered 2017-01-26: 10 mL via INTRAVENOUS
  Filled 2017-01-26: qty 10

## 2017-01-26 MED ORDER — ALPRAZOLAM 0.25 MG PO TABS: 0.2500 mg | ORAL_TABLET | Freq: Two times a day (BID) | ORAL | 1 refills | Status: DC | PRN

## 2017-01-26 MED ORDER — HEPARIN SOD (PORK) LOCK FLUSH 100 UNIT/ML IV SOLN
500.0000 [IU] | Freq: Once | INTRAVENOUS | Status: AC
  Administered 2017-01-26: 500 [IU] via INTRAVENOUS
  Filled 2017-01-26: qty 5

## 2017-01-26 NOTE — Progress Notes (Signed)
Complains of weakness and fatigue. Having intermittent pain in left chest wall. Pt describes the pain as a muscle spasm.

## 2017-01-27 ENCOUNTER — Ambulatory Visit (INDEPENDENT_AMBULATORY_CARE_PROVIDER_SITE_OTHER): Payer: Managed Care, Other (non HMO) | Admitting: Unknown Physician Specialty

## 2017-01-27 ENCOUNTER — Encounter: Payer: Self-pay | Admitting: Unknown Physician Specialty

## 2017-01-27 VITALS — BP 97/65 | HR 102 | Temp 98.3°F | Wt 140.0 lb

## 2017-01-27 DIAGNOSIS — K580 Irritable bowel syndrome with diarrhea: Secondary | ICD-10-CM | POA: Diagnosis not present

## 2017-01-27 DIAGNOSIS — G8929 Other chronic pain: Secondary | ICD-10-CM

## 2017-01-27 DIAGNOSIS — M545 Low back pain: Secondary | ICD-10-CM | POA: Diagnosis not present

## 2017-01-27 DIAGNOSIS — K589 Irritable bowel syndrome without diarrhea: Secondary | ICD-10-CM | POA: Insufficient documentation

## 2017-01-27 DIAGNOSIS — E538 Deficiency of other specified B group vitamins: Secondary | ICD-10-CM | POA: Diagnosis not present

## 2017-01-27 MED ORDER — GABAPENTIN 300 MG PO CAPS: 300.0000 mg | ORAL_CAPSULE | Freq: Three times a day (TID) | ORAL | 3 refills | Status: DC

## 2017-01-27 MED ORDER — CYANOCOBALAMIN 1000 MCG/ML IJ SOLN
1000.0000 ug | INTRAMUSCULAR | Status: DC
  Administered 2017-04-10: 1000 ug via INTRAMUSCULAR

## 2017-01-27 MED ORDER — CYANOCOBALAMIN 1000 MCG/ML IJ SOLN
1000.0000 ug | Freq: Once | INTRAMUSCULAR | Status: AC
  Administered 2017-01-27: 1000 ug via INTRAMUSCULAR

## 2017-01-27 NOTE — Assessment & Plan Note (Signed)
Continued back pain.  Rx Gabapentin 300 mg up to three times/day.

## 2017-01-27 NOTE — Progress Notes (Signed)
BP 97/65 (BP Location: Left Arm, Patient Position: Sitting, Cuff Size: Normal)   Pulse (!) 102   Temp 98.3 F (36.8 C)   Wt 140 lb (63.5 kg)   LMP  (LMP Unknown)   SpO2 97%   BMI 24.72 kg/m    Subjective:    Patient ID: Jill Gross, female    DOB: Feb 05, 1956, 61 y.o.   MRN: [id number]  HPI: Jill Gross is a 61 y.o. female  Chief Complaint  Patient presents with  . Follow-up  . Back Pain    pt states she is still having back issues, states she knows Malachy Mood cannot prescribe pain medication but would like something to ease the pain  . Diarrhea    pt states she has been having "pasty and runny" stools  . Hemorrhoids    pt states she thinks her hemorrhoids are bleeding  . Immunizations    pt states she would like to have another pneumonia vaccine if possible    Back pain Pt with years of back pain.  States this continues to bother her.  States it "hits her" and needs to sit down.  States it is worse probably due to constant sitting.  She has been in pain management before.    Diarrhea Diagnosed with IBS in the past.  Pt states a probiotic - Align helped.  Suspect hemorrhoids are a problem due to sitting all the time   Relevant past medical, surgical, family and social history reviewed and updated as indicated. Interim medical history since our last visit reviewed. Allergies and medications reviewed and updated.  Review of Systems  Per HPI unless specifically indicated above     Objective:    BP 97/65 (BP Location: Left Arm, Patient Position: Sitting, Cuff Size: Normal)   Pulse (!) 102   Temp 98.3 F (36.8 C)   Wt 140 lb (63.5 kg)   LMP  (LMP Unknown)   SpO2 97%   BMI 24.72 kg/m   Wt Readings from Last 3 Encounters:  01/27/17 140 lb (63.5 kg)  01/26/17 139 lb 10.6 oz (63.3 kg)  01/08/17 141 lb 8.6 oz (64.2 kg)    Physical Exam  Constitutional: She is oriented to person, place, and time. She appears well-developed and well-nourished. No distress.  HENT:    Head: Normocephalic and atraumatic.  Eyes: Conjunctivae and lids are normal. Right eye exhibits no discharge. Left eye exhibits no discharge. No scleral icterus.  Neck: Normal range of motion. Neck supple. No JVD present. Carotid bruit is not present.  Cardiovascular: Normal rate, regular rhythm and normal heart sounds.   Pulmonary/Chest: Effort normal and breath sounds normal.  Abdominal: Normal appearance. There is no splenomegaly or hepatomegaly.  Musculoskeletal: Normal range of motion.  Neurological: She is alert and oriented to person, place, and time.  Skin: Skin is warm, dry and intact. No rash noted. No pallor.  Psychiatric: She has a normal mood and affect. Her behavior is normal. Judgment and thought content normal.     Assessment & Plan:   Problem List Items Addressed This Visit      Unprioritized   IBS (irritable bowel syndrome)    Recommended Align      Low back pain    Continued back pain.  Rx Gabapentin 300 mg up to three times/day.        Vitamin B12 deficiency - Primary   Relevant Medications   cyanocobalamin ((VITAMIN B-12)) injection 1,000 mcg (Completed)   cyanocobalamin ((VITAMIN B-12))  injection 1,000 mcg (Start on 02/24/2017 12:00 AM)       Follow up plan: Return in about 2 months (around 03/27/2017) for TSH and thyroid paenl.

## 2017-01-27 NOTE — Patient Instructions (Addendum)
Align- probiotic at the drug store [facility]

## 2017-01-27 NOTE — Assessment & Plan Note (Signed)
Recommended [employer]

## 2017-01-28 ENCOUNTER — Telehealth: Payer: Self-pay

## 2017-01-28 NOTE — Telephone Encounter (Signed)
Received a call from [facility]. The lady I spoke with called to let me know that they are still looking for a provider to go out and see the patient. She stated that they have exhausted in network providers and are currently trying out of network provider. The lady asked if the patient could do any outpatient PT and I told her that the patient is in a wheelchair due to weakness so we were really trying to get in home PT. I asked if they had told the patient this information and she stated that she would call the patient and let her know. She stated she would also call me back with an update as well.

## 2017-02-03 ENCOUNTER — Telehealth: Payer: Self-pay

## 2017-02-03 NOTE — Telephone Encounter (Signed)
Called and left patient and/or her husband a VM asking for her to please my call.

## 2017-02-03 NOTE — Telephone Encounter (Signed)
Received an updated call from [facility]. The lady I spoke with stated that they cannot find a provider to go out to see the patient. She said that they have now exhausted out of network providers. The lady stated that she was going to call and follow up with the patient to let them know where they were. Will call patient and follow up with her as well.

## 2017-02-04 NOTE — Telephone Encounter (Signed)
Called and spoke with patient's husband. I explained everything that was going on and that we are unable to find anyone for in home PT that takes [employer]. Patient's husband stated that [facility] has contacted that and told them that. I asked if the patient may be interested in going to facility to have PT. Patient's husband stated that he would talk with her and decided. Husband thanked me for all of my help and I asked for them to just give me a call back whenever they decided what they would like to do moving forward. I also suggested calling the insurance company and see if they can assist in finding somewhere who offers PT services and is covered by them.

## 2017-02-18 ENCOUNTER — Other Ambulatory Visit: Payer: Self-pay | Admitting: Unknown Physician Specialty

## 2017-02-26 ENCOUNTER — Other Ambulatory Visit: Payer: Self-pay | Admitting: Unknown Physician Specialty

## 2017-02-26 NOTE — Telephone Encounter (Signed)
Patients husband called to see if Malachy Mood would call the patient in something for her cough.    Thank You   [phone] Mr Feldkamp

## 2017-02-26 NOTE — Telephone Encounter (Signed)
Called and spoke to patient's husband. He stated that the patient was having cloudy phlegm and seems to cough more when active. I let him know that Malachy Mood was not in office today but would be back tomorrow. He stated that was OK and I told him I would call tomorrow morning. They would like something sent in to [facility] in [place] if possible.

## 2017-02-27 MED ORDER — GUAIFENESIN-CODEINE 100-10 MG/5ML PO SOLN: 10.0000 mL | Freq: Three times a day (TID) | ORAL | 0 refills | Status: DC | PRN

## 2017-02-27 MED ORDER — CYANOCOBALAMIN 1000 MCG/ML IJ SOLN: 1000.0000 ug | Freq: Once | INTRAMUSCULAR | 1 refills | Status: AC

## 2017-02-27 NOTE — Telephone Encounter (Signed)
Called and let patient's husband know that cough syrup and b12 were sent in as requested.

## 2017-02-27 NOTE — Telephone Encounter (Signed)
Called pharmacy to see if cough syrup could be called in and they stated that it could. So I called it in for the patient. Will cal patient's husband and let him know.

## 2017-02-27 NOTE — Telephone Encounter (Signed)
Routing to provider  

## 2017-02-27 NOTE — Telephone Encounter (Signed)
Were they looking for a cough suppressant or an antibiotic?

## 2017-02-27 NOTE — Telephone Encounter (Signed)
Patient needs a refill called in for her B12 injection med and also she wants a cough suppressant instead of antibiotic.  [facility]  Thanks

## 2017-03-03 ENCOUNTER — Ambulatory Visit
Admission: RE | Admit: 2017-03-03 | Discharge: 2017-03-03 | Disposition: A | Discharge status: Home / Self Care | Payer: Managed Care, Other (non HMO) | Source: Ambulatory Visit | Attending: Oncology | Admitting: Oncology

## 2017-03-03 DIAGNOSIS — J90 Pleural effusion, not elsewhere classified: Secondary | ICD-10-CM | POA: Insufficient documentation

## 2017-03-03 DIAGNOSIS — R918 Other nonspecific abnormal finding of lung field: Secondary | ICD-10-CM | POA: Insufficient documentation

## 2017-03-03 DIAGNOSIS — I313 Pericardial effusion (noninflammatory): Secondary | ICD-10-CM | POA: Insufficient documentation

## 2017-03-03 DIAGNOSIS — C3432 Malignant neoplasm of lower lobe, left bronchus or lung: Secondary | ICD-10-CM | POA: Diagnosis not present

## 2017-03-03 MED ORDER — IOPAMIDOL (ISOVUE-300) INJECTION 61%
75.0000 mL | Freq: Once | INTRAVENOUS | Status: AC | PRN
  Administered 2017-03-03: 75 mL via INTRAVENOUS

## 2017-03-03 NOTE — Progress Notes (Signed)
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  INTERVAL HISTORY: Patient returns to clinic today for further evaluation and discussion of her imaging results. Her performance status is decreased, but continues to slowly improve. She continues to have chronic shortness of breath, but this is also improved. She has a fair appetite, but no further weight loss.  She also has continued neuropathy particularly in her fingertips. She has no other neurologic complaints. She denies any recent fevers. She denies any chest pain or hemoptysis. She denies any nausea, vomiting, constipation, or diarrhea. She has no urinary complaints. Patient feels generally terrible, but offers no further specific complaints.   REVIEW OF SYSTEMS:   Review of Systems  Constitutional: Positive for malaise/fatigue. Negative for fever and weight loss.  HENT: Negative for sore throat.   Eyes: Negative.   Respiratory: Positive for shortness of breath. Negative for cough and hemoptysis.   Cardiovascular: Negative.  Negative for chest pain and leg swelling.  Gastrointestinal: Negative for nausea and vomiting.  Genitourinary: Negative.   Musculoskeletal: Negative.   Skin: Negative.   Neurological: Positive for sensory change and weakness. Negative for dizziness and tingling.  Psychiatric/Behavioral: Negative.  Negative for depression. The patient is not nervous/anxious.     As per HPI. Otherwise, a complete review of systems is negative.  PAST MEDICAL HISTORY: Past Medical History:  Diagnosis Date  . Allergy   . Anemia    pernicious  . Anxiety   . Arthritis   . Atrophic vaginitis   .  COPD (chronic obstructive pulmonary disease) ([place])   . Depression   . Diverticulosis   . Dyspnea   . Fatigue   . GERD (gastroesophageal reflux disease)   . History of chemotherapy 04/2016  . Hyperlipidemia   . Hypothyroid   . Insomnia   . Low back pain   . Lumbago   . Neuropathy due to chemotherapeutic drug ([place])   . Osteopenia   . Small cell carcinoma of left lung ([place]) 01/2016   rad + chemo tx's  . Tobacco use   . Vitamin B12 deficiency     PAST SURGICAL HISTORY: Past Surgical History:  Procedure Laterality Date  . ABDOMINAL HYSTERECTOMY    . ANKLE SURGERY     x4  . CARPAL TUNNEL RELEASE  April 2015  . CHOLECYSTECTOMY    . ENDOBRONCHIAL ULTRASOUND N/A 01/07/2016   Procedure: ENDOBRONCHIAL ULTRASOUND;  Surgeon: Vilinda Boehringer, MD;  Location: [facility] ORS;  Service: Cardiopulmonary;  Laterality: N/A;  . ENDOBRONCHIAL ULTRASOUND N/A 09/16/2016   Procedure: ENDOBRONCHIAL ULTRASOUND;  Surgeon: Vilinda Boehringer, MD;  Location: [facility] ORS;  Service: Cardiopulmonary;  Laterality: N/A;  . HERNIA REPAIR    . PORTACATH PLACEMENT Right 01/23/2016   Procedure: INSERTION PORT-A-CATH;  Surgeon: [name], MD;  Location: [facility] ORS;  Service: General;  Laterality: Right;    FAMILY HISTORY Family History  Problem Relation Age of Onset  . Emphysema Mother   . Diabetes Father        ADVANCED DIRECTIVES:    HEALTH MAINTENANCE: Social History  Substance Use Topics  . Smoking status: Former Smoker    Packs/day: 1.00    Years: 30.00    Types:  Cigarettes    Quit date: 01/11/2016  . Smokeless tobacco: Never Used  . Alcohol use No     Allergies  Allergen Reactions  . Macrobid [Nitrofurantoin] Anaphylaxis  . Skelaxin [Metaxalone] Anaphylaxis  . Effexor [Venlafaxine]   . Hctz [Hydrochlorothiazide] Other (See Comments)    cramps  . Neurontin [Gabapentin]   . Paxil [Paroxetine Hcl]   . Pravachol [Pravastatin Sodium] Other (See Comments)    aching  . Prozac [Fluoxetine Hcl]   . Sulfur  Other (See Comments)    "blisters in mouth"  . Wellbutrin [Bupropion] Itching  . Zoloft [Sertraline Hcl]     Current Outpatient Prescriptions  Medication Sig Dispense Refill  . ALPRAZolam (XANAX) 0.25 MG tablet Take 1 tablet (0.25 mg total) by mouth 2 (two) times daily as needed for anxiety. 60 tablet 1  . amitriptyline (ELAVIL) 10 MG tablet Take 2 tablets (20 mg total) by mouth at bedtime. 60 tablet 12  . ELIQUIS 5 MG TABS tablet TAKE ONE TABLET BY MOUTH TWICE DAILY. 60 tablet 0  . feeding supplement, ENSURE ENLIVE, (ENSURE ENLIVE) LIQD Take 237 mLs by mouth 2 (two) times daily between meals. 237 mL 12  . fluticasone furoate-vilanterol (BREO ELLIPTA) 200-25 MCG/INH AEPB Inhale 1 puff into the lungs daily. 1 each 12  . gabapentin (NEURONTIN) 300 MG capsule Take 1 capsule (300 mg total) by mouth 3 (three) times daily. 90 capsule 3  . guaiFENesin-codeine 100-10 MG/5ML syrup Take 10 mLs by mouth 3 (three) times daily as needed for cough. 120 mL 0  . ipratropium-albuterol (DUONEB) 0.5-2.5 (3) MG/3ML SOLN Take 3 mLs by nebulization every 6 (six) hours as needed. 360 mL 0  . levothyroxine (SYNTHROID, LEVOTHROID) 137 MCG tablet Take 137 mcg by mouth daily before breakfast.    . ranitidine (ZANTAC) 150 MG tablet Take 150 mg by mouth 2 (two) times daily as needed for heartburn.    . simvastatin (ZOCOR) 10 MG tablet Take 1 tablet (10 mg total) by mouth daily at 6 PM. 30 tablet 6  . zolpidem (AMBIEN) 10 MG tablet TAKE ONE TABLET AT BEDTIME. 30 tablet 2   Current Facility-Administered Medications  Medication Dose Route Frequency Provider Last Rate Last Dose  . cyanocobalamin ((VITAMIN B-12)) injection 1,000 mcg  1,000 mcg Intramuscular Q30 days [name], NP        OBJECTIVE: Vitals:   03/05/17 1513  BP: 124/65  Pulse: (!) 116  Temp: 98.9 F (37.2 C)     Body mass index is 24.02 kg/m.    ECOG FS:1 - Symptomatic but completely ambulatory  General: No acute distress. Sitting in  wheelchair Eyes: Pink conjunctiva, anicteric sclera. HEENT: Oropharynx clear without erythema or exudate. Lungs: Clear to auscultation bilaterally. Heart: Tachycardia. No rubs, murmurs, or gallops. Musculoskeletal: No edema, cyanosis, or clubbing. Neuro: Alert, answering all questions appropriately. Cranial nerves grossly intact. Skin: No rashes or petechiae noted. Poor skin turgor. Psych: Normal affect.   LAB RESULTS:  Lab Results  Component Value Date   NA 139 01/26/2017   K 3.3 (L) 01/26/2017   CL 102 01/26/2017   CO2 29 01/26/2017   GLUCOSE 100 (H) 01/26/2017   BUN 6 01/26/2017   CREATININE 0.69 01/26/2017   CALCIUM 8.7 (L) 01/26/2017   PROT 6.4 (L) 01/26/2017   ALBUMIN 3.1 (L) 01/26/2017   AST 43 (H) 01/26/2017   ALT 24 01/26/2017   ALKPHOS 61 01/26/2017   BILITOT 0.2 (L) 01/26/2017   GFRNONAA >60 01/26/2017   GFRAA >60  01/26/2017    Lab Results  Component Value Date   WBC 4.9 01/26/2017   NEUTROABS 2.6 01/26/2017   HGB 9.8 (L) 01/26/2017   HCT 29.0 (L) 01/26/2017   MCV 88.7 01/26/2017   PLT 220 01/26/2017     STUDIES: Ct Chest W Contrast  Result Date: 03/03/2017 CLINICAL DATA:  Lung cancer restaging. EXAM: CT CHEST WITH CONTRAST TECHNIQUE: Multidetector CT imaging of the chest was performed during intravenous contrast administration. CONTRAST:  51m ISOVUE-300 IOPAMIDOL (ISOVUE-300) INJECTION 61% COMPARISON:  11/25/2016 FINDINGS: Cardiovascular: The heart size is normal. No pericardial effusion. Coronary artery calcification is noted. Atherosclerotic calcification is noted in the wall of the thoracic aorta. Small pericardial effusion slightly progressed in the interval. Right lower lobe pulmonary embolus seen previously has resolved in the interval. Right Port-A-Cath tip is positioned in the in the distal SVC. Mediastinum/Nodes: No mediastinal lymphadenopathy. There is no hilar lymphadenopathy. There is no axillary lymphadenopathy. Lungs/Pleura: Left pleural  effusion is now large and there is evidence of pleural gas anteriorly in the left hemithorax. Complete collapse/consolidation of the left upper and lower lobes is evident. Left mainstem bronchus and lobar airways to the left lung R filled with debris. Upper Abdomen: Unremarkable. Musculoskeletal: Bone windows reveal no worrisome lytic or sclerotic osseous lesions. IMPRESSION: 1. Interval progression of left pleural effusion which is now large. There is nondependent small volume of gas in the left pleural space, compatible with hydropneumothorax. 2. Complete collapse/consolidation left lung with large volume debris noted in the left mainstem bronchus and lobar airways to the left upper and lower lobes. 3. Slight increase in small pericardial effusion. These results will be called to the ordering clinician or representative by the Radiologist Assistant, and communication documented in the PACS or zVision Dashboard. Electronically Signed   By: EMisty StanleyM.D.   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  PLAN:    1. 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 Previously, bronchoscopy and biopsy results noted with no obvious evidence of recurrence either. No intervention is needed at this time. Patient has been instructed to follow-up with pulmonary as scheduled. Can also consider thoracic surgery referral for possible Pleurx catheter. Return to clinic in 3 months with repeat imaging and further evaluation.  2. Pulmonary embolism: Likely secondary to increased sedentary lifestyle. Continue Eliquis for a total of 6 months completing in June 2018.    3. Poor appetite: Improving, monitor.  4. Hypokalemia: Continue  oral supplementation as prescribed. 5. Peripheral neuropathy: Patient is no longer taking gabapentin. 6. Anemia: Patient's hemoglobin remains decreased, but stable. Monitor. 7. Thyroid panel: Previously T4 was nearly undetectable with elevated TSH. Patient now appears to be slightly overtreated. Currently taking 135 g Synthroid. Will defer to primary care for adjustment of medications.    Patient expressed understanding and was in agreement with this plan. She also understands that She can call clinic at any time with any questions, concerns, or complaints.    TLloyd Huger MD   03/10/2017 10:13 PM

## 2017-03-05 ENCOUNTER — Inpatient Hospital Stay: Payer: Managed Care, Other (non HMO)

## 2017-03-05 ENCOUNTER — Inpatient Hospital Stay: Payer: Managed Care, Other (non HMO) | Attending: Oncology | Admitting: Oncology

## 2017-03-05 ENCOUNTER — Encounter: Payer: Self-pay | Admitting: Oncology

## 2017-03-05 VITALS — BP 124/65 | HR 116 | Temp 98.9°F | Wt 136.0 lb

## 2017-03-05 DIAGNOSIS — E785 Hyperlipidemia, unspecified: Secondary | ICD-10-CM | POA: Insufficient documentation

## 2017-03-05 DIAGNOSIS — R5383 Other fatigue: Secondary | ICD-10-CM | POA: Insufficient documentation

## 2017-03-05 DIAGNOSIS — M8580 Other specified disorders of bone density and structure, unspecified site: Secondary | ICD-10-CM | POA: Insufficient documentation

## 2017-03-05 DIAGNOSIS — G4700 Insomnia, unspecified: Secondary | ICD-10-CM | POA: Diagnosis not present

## 2017-03-05 DIAGNOSIS — Z9221 Personal history of antineoplastic chemotherapy: Secondary | ICD-10-CM | POA: Diagnosis not present

## 2017-03-05 DIAGNOSIS — I313 Pericardial effusion (noninflammatory): Secondary | ICD-10-CM | POA: Diagnosis not present

## 2017-03-05 DIAGNOSIS — C3432 Malignant neoplasm of lower lobe, left bronchus or lung: Secondary | ICD-10-CM | POA: Diagnosis not present

## 2017-03-05 DIAGNOSIS — M129 Arthropathy, unspecified: Secondary | ICD-10-CM

## 2017-03-05 DIAGNOSIS — Z923 Personal history of irradiation: Secondary | ICD-10-CM | POA: Diagnosis not present

## 2017-03-05 DIAGNOSIS — Z86711 Personal history of pulmonary embolism: Secondary | ICD-10-CM | POA: Diagnosis not present

## 2017-03-05 DIAGNOSIS — G629 Polyneuropathy, unspecified: Secondary | ICD-10-CM | POA: Diagnosis not present

## 2017-03-05 DIAGNOSIS — F329 Major depressive disorder, single episode, unspecified: Secondary | ICD-10-CM | POA: Insufficient documentation

## 2017-03-05 DIAGNOSIS — D649 Anemia, unspecified: Secondary | ICD-10-CM | POA: Insufficient documentation

## 2017-03-05 DIAGNOSIS — K219 Gastro-esophageal reflux disease without esophagitis: Secondary | ICD-10-CM | POA: Diagnosis not present

## 2017-03-05 DIAGNOSIS — R531 Weakness: Secondary | ICD-10-CM

## 2017-03-05 DIAGNOSIS — Z95828 Presence of other vascular implants and grafts: Secondary | ICD-10-CM

## 2017-03-05 DIAGNOSIS — Z452 Encounter for adjustment and management of vascular access device: Secondary | ICD-10-CM | POA: Insufficient documentation

## 2017-03-05 DIAGNOSIS — F419 Anxiety disorder, unspecified: Secondary | ICD-10-CM | POA: Diagnosis not present

## 2017-03-05 DIAGNOSIS — J449 Chronic obstructive pulmonary disease, unspecified: Secondary | ICD-10-CM | POA: Diagnosis not present

## 2017-03-05 DIAGNOSIS — E876 Hypokalemia: Secondary | ICD-10-CM | POA: Insufficient documentation

## 2017-03-05 DIAGNOSIS — Z87891 Personal history of nicotine dependence: Secondary | ICD-10-CM | POA: Diagnosis not present

## 2017-03-05 DIAGNOSIS — E039 Hypothyroidism, unspecified: Secondary | ICD-10-CM | POA: Diagnosis not present

## 2017-03-05 DIAGNOSIS — Z79899 Other long term (current) drug therapy: Secondary | ICD-10-CM

## 2017-03-05 MED ORDER — HEPARIN SOD (PORK) LOCK FLUSH 100 UNIT/ML IV SOLN
500.0000 [IU] | Freq: Once | INTRAVENOUS | Status: AC
  Administered 2017-03-05: 500 [IU] via INTRAVENOUS

## 2017-03-05 MED ORDER — SODIUM CHLORIDE 0.9% FLUSH
10.0000 mL | INTRAVENOUS | Status: DC | PRN
  Administered 2017-03-05: 10 mL via INTRAVENOUS
  Filled 2017-03-05: qty 10

## 2017-03-06 ENCOUNTER — Encounter: Payer: Self-pay | Admitting: *Deleted

## 2017-03-12 ENCOUNTER — Telehealth: Payer: Self-pay | Admitting: *Deleted

## 2017-03-12 ENCOUNTER — Telehealth: Payer: Self-pay

## 2017-03-12 NOTE — Telephone Encounter (Signed)
Patient seeing Dr. Genevive Bi on 4/6 to discuss placement of Pleurx Catheter for next week.   Faxed anticoagulant clearance at this time to Dr. Park Liter at [facility].

## 2017-03-12 NOTE — Telephone Encounter (Signed)
Spoke with patient's husband regarding appointment to be scheduled for Dr. Genevive Bi for PleurX catheter placement. Education was offered regarding pleurx catheter and all questions were answered while on the phone. Informed pt's husband that pt and himself will be given more education at [facility] prior to pleurx being placed. Understanding was verbalized. Contact information was given and encouraged pt's husband to call if has further questions.

## 2017-03-13 ENCOUNTER — Telehealth: Payer: Self-pay

## 2017-03-13 ENCOUNTER — Encounter: Payer: Self-pay | Admitting: Cardiothoracic Surgery

## 2017-03-13 ENCOUNTER — Ambulatory Visit (INDEPENDENT_AMBULATORY_CARE_PROVIDER_SITE_OTHER): Payer: Managed Care, Other (non HMO) | Admitting: Cardiothoracic Surgery

## 2017-03-13 VITALS — BP 85/61 | HR 125 | Temp 97.7°F | Resp 16 | Ht 63.0 in | Wt 136.0 lb

## 2017-03-13 DIAGNOSIS — J90 Pleural effusion, not elsewhere classified: Secondary | ICD-10-CM

## 2017-03-13 NOTE — Telephone Encounter (Signed)
Called Dr.Johnson's office and spoke with Santiago Glad to check status of Anti-coagulant and refaxed clearance form to [name] at this time.

## 2017-03-13 NOTE — Patient Instructions (Signed)
Please give [place] a call once you decide on what you would want to do.

## 2017-03-13 NOTE — Progress Notes (Signed)
Patient ID: Jill Gross, female   DOB: 09/06/1956, 61 y.o.   MRN: [id number]  Chief Complaint  Patient presents with  . New Patient (Initial Visit)    Left Pleural Effusion    Referred By Dr. [name] Reason for Referral left pleural effusion  HPI Location, Quality, Duration, Severity, Timing, Context, Modifying Factors, Associated Signs and Symptoms.  Jill Gross is a 61 y.o. female.  She was diagnosed with a left lower lobe carcinoma the lung about a year and a half ago. She underwent chemotherapy and radiation therapy. In November this year she was admitted with pneumonia and then several weeks later had a pulmonary embolism and has been on eloquence since December. She was recently seen by Dr. [name] where a chest CT was performed revealing a large left pleural effusion. Over some debris present within the left mainstem bronchus but no obvious mass. In addition there was some pericardial fluid and the patient states that she has been unable to participate in physical therapy because of her tachycardia. She states that she's been weak, tired and has had extensive sinus drainage. She is able to perform most of the activities of daily living. She has not had any fevers, chills or hemoptysis. She does have a right sided Port-A-Cath in place that is being flushed. She states that she has completed all of her therapy.   Past Medical History:  Diagnosis Date  . Allergy   . Anemia    pernicious  . Anxiety   . Arthritis   . Atrophic vaginitis   . COPD (chronic obstructive pulmonary disease) ([place])   . Depression   . Diverticulosis   . Dyspnea   . Fatigue   . GERD (gastroesophageal reflux disease)   . History of chemotherapy 04/2016  . Hyperlipidemia   . Hypothyroid   . Insomnia   . Low back pain   . Lumbago   . Neuropathy due to chemotherapeutic drug ([place])   . Osteopenia   . Small cell carcinoma of left lung ([place]) 01/2016   rad + chemo tx's  . Tobacco use   . Vitamin B12  deficiency     Past Surgical History:  Procedure Laterality Date  . ABDOMINAL HYSTERECTOMY    . ANKLE SURGERY     x4  . CARPAL TUNNEL RELEASE  April 2015  . CHOLECYSTECTOMY    . ENDOBRONCHIAL ULTRASOUND N/A 01/07/2016   Procedure: ENDOBRONCHIAL ULTRASOUND;  Surgeon: Vilinda Boehringer, MD;  Location: [facility] ORS;  Service: Cardiopulmonary;  Laterality: N/A;  . ENDOBRONCHIAL ULTRASOUND N/A 09/16/2016   Procedure: ENDOBRONCHIAL ULTRASOUND;  Surgeon: Vilinda Boehringer, MD;  Location: [facility] ORS;  Service: Cardiopulmonary;  Laterality: N/A;  . HERNIA REPAIR    . PORTACATH PLACEMENT Right 01/23/2016   Procedure: INSERTION PORT-A-CATH;  Surgeon: [name], MD;  Location: [facility] ORS;  Service: General;  Laterality: Right;    Family History  Problem Relation Age of Onset  . Emphysema Mother   . Diabetes Father     Social History Social History  Substance Use Topics  . Smoking status: Former Smoker    Packs/day: 1.00    Years: 30.00    Types: Cigarettes    Quit date: 01/11/2016  . Smokeless tobacco: Never Used  . Alcohol use No    Allergies  Allergen Reactions  . Macrobid [Nitrofurantoin] Anaphylaxis  . Skelaxin [Metaxalone] Anaphylaxis  . Effexor [Venlafaxine]   . Hctz [Hydrochlorothiazide] Other (See Comments)    cramps  . Neurontin [Gabapentin]   .  Paxil [Paroxetine Hcl]   . Pravachol [Pravastatin Sodium] Other (See Comments)    aching  . Prozac [Fluoxetine Hcl]   . Sulfur Other (See Comments)    "blisters in mouth"  . Wellbutrin [Bupropion] Itching  . Zoloft [Sertraline Hcl]     Current Outpatient Prescriptions  Medication Sig Dispense Refill  . ALPRAZolam (XANAX) 0.25 MG tablet Take 1 tablet (0.25 mg total) by mouth 2 (two) times daily as needed for anxiety. 60 tablet 1  . amitriptyline (ELAVIL) 10 MG tablet Take 2 tablets (20 mg total) by mouth at bedtime. 60 tablet 12  . ELIQUIS 5 MG TABS tablet TAKE ONE TABLET BY MOUTH TWICE DAILY. 60 tablet 0  . feeding supplement, ENSURE  ENLIVE, (ENSURE ENLIVE) LIQD Take 237 mLs by mouth 2 (two) times daily between meals. 237 mL 12  . fluticasone furoate-vilanterol (BREO ELLIPTA) 200-25 MCG/INH AEPB Inhale 1 puff into the lungs daily. 1 each 12  . gabapentin (NEURONTIN) 300 MG capsule Take 1 capsule (300 mg total) by mouth 3 (three) times daily. 90 capsule 3  . guaiFENesin-codeine 100-10 MG/5ML syrup Take 10 mLs by mouth 3 (three) times daily as needed for cough. 120 mL 0  . ipratropium-albuterol (DUONEB) 0.5-2.5 (3) MG/3ML SOLN Take 3 mLs by nebulization every 6 (six) hours as needed. 360 mL 0  . levothyroxine (SYNTHROID, LEVOTHROID) 137 MCG tablet Take 137 mcg by mouth daily before breakfast.    . ranitidine (ZANTAC) 150 MG tablet Take 150 mg by mouth 2 (two) times daily as needed for heartburn.    . simvastatin (ZOCOR) 10 MG tablet Take 1 tablet (10 mg total) by mouth daily at 6 PM. 30 tablet 6  . zolpidem (AMBIEN) 10 MG tablet TAKE ONE TABLET AT BEDTIME. 30 tablet 2   Current Facility-Administered Medications  Medication Dose Route Frequency Provider Last Rate Last Dose  . cyanocobalamin ((VITAMIN B-12)) injection 1,000 mcg  1,000 mcg Intramuscular Q30 days [name], NP          Review of Systems A complete review of systems was asked and was negative except for the following positive findings Shortness of breath, cough, nasal congestion with sinus drainage, weakness, tachycardia.  Blood pressure (!) 85/61, pulse (!) 125, temperature 97.7 F (36.5 C), temperature source Oral, resp. rate 16, height '5\' 3"'$  (1.6 m), weight 136 lb (61.7 kg).  Physical Exam CONSTITUTIONAL:  Pleasant, well-developed, well-nourished, and in no acute distress. EYES: Pupils equal and reactive to light, Sclera non-icteric EARS, NOSE, MOUTH AND THROAT:  The oropharynx was clear.  Dentition is absent.  Oral mucosa pink and moist. LYMPH NODES:  Lymph nodes in the neck and axillae were normal RESPIRATORY:  Lungs were clear on the right and  markedly diminished on the left.  Normal respiratory effort without pathologic use of accessory muscles of respiration CARDIOVASCULAR: Heart was regular without murmurs.  There were no carotid bruits. GI: The abdomen was soft, nontender, and nondistended. There were no palpable masses. There was no hepatosplenomegaly. There were normal bowel sounds in all quadrants. GU:  Rectal deferred.   MUSCULOSKELETAL:  Normal muscle strength and tone.  No clubbing or cyanosis.   SKIN:  There were no pathologic skin lesions.  There were no nodules on palpation. NEUROLOGIC:  Sensation is normal.  Cranial nerves are grossly intact. PSYCH:  Oriented to person, place and time.  Mood and affect are normal.  Data Reviewed CT scan  I have personally reviewed the patient's imaging, laboratory findings and medical  records.    Assessment    I had a long discussion with the patient and her husband today. She has not had her pleural effusion drained in the past. 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 I reviewed with her the options including thoracentesis and/or bronchoscopy to start. She was adamant and declining any seizures that would not involve a general anesthetic. I then discussed with her the possibility of performing a Pleurx catheter insertion or a thoracoscopy with talc pleurodesis. I explained to her that these latter 2 surgical procedures were more risky and it was unclear if her lung would expand or not which may make these procedures less successful. The patient declined to review the Pleurx catheter video while in clinic today. She did have all of her questions answered.    Plan    After extensive discussion with the patient and her husband they were unable to come up with a definitive decision today. I gave them both my business card and told them that they should think about their options. She will contact me next week when she's had an opportunity discuss these with her  husband in more depth and with her family. They did take the Pleurx catheter instruction manual and educational packet with them. We will attempt to get clearance to stop her eloquence prior to any of these interventions.       [name], MD 03/13/2017, 8:51 AM

## 2017-03-13 NOTE — Telephone Encounter (Signed)
Patient came in to see Dr. Genevive Bi today 03/13/17. She has decided to do the needle localization, drawing the fluid out with a needle while using the x-ray. Then having the fluid tested to see what's causing the problem. Please call patient and advice.

## 2017-03-13 NOTE — Telephone Encounter (Signed)
Returned phone call to patient. The patient is in agreement to have the Thoracentesis performed but is asking to be sedated for this procedure. I explained to patient that sedation is not generally used for this procedure but we will speak with radiology and decide how to proceed.  She is in agreement with this plan.

## 2017-03-16 NOTE — Telephone Encounter (Signed)
Spoke with Pamala Hurry in scheduling for Thoracentesis. She will speak with radiology in regards to sedation and call me back with an answer so that I can speak with the patient prior to scheduling this procedure.

## 2017-03-16 NOTE — Telephone Encounter (Signed)
Anti-coagulant Clearance to stop Eliquis is obtained at this time from Dr.Cheryl Julian Hy and will be scanned under Media.

## 2017-03-18 NOTE — Telephone Encounter (Signed)
Return call made to scheduling today. Spoke with [place]. She confirmed with radiologist that there is no sedation given during thoracentesis.  Returned phone call to patient to relay this information and see how she would like to proceed knowing this information.  Spoke with the patient. She would like to talk this over with her husband before she decides on which procedure to do and she will call back with that information after speaking with him.

## 2017-03-20 ENCOUNTER — Other Ambulatory Visit: Payer: Self-pay | Admitting: Family Medicine

## 2017-03-20 ENCOUNTER — Other Ambulatory Visit: Payer: Self-pay | Admitting: Unknown Physician Specialty

## 2017-03-20 NOTE — Telephone Encounter (Signed)
Patient's husband returned phone call and states that he has spoken with the patient and that she is agreeable to do the Thoracentesis without sedation.   Orders placed.  [facility] Invasive Checklist faxed to Specialty Scheduling at this time.  Clearance has been obtained for patient to stop Eliquis 3 days prior to Thoracentesis. This has been scanned into the chart under media.  Awaiting appointment for Thoracentesis from Specials at this time.

## 2017-03-20 NOTE — Addendum Note (Signed)
Addended by: Phillips Odor on: 03/20/2017 03:18 PM   Modules accepted: Orders

## 2017-03-20 NOTE — Telephone Encounter (Signed)
Your patient 

## 2017-03-23 NOTE — Telephone Encounter (Signed)
Call made to [facility] in specialty scheduling at this time. No answer. Left voicemail asking for an urgent returned phone call with an appointment as soon as possible.

## 2017-03-24 NOTE — Telephone Encounter (Addendum)
Thoracentesis scheduled tomorrow at 1 pm. [facility] [place] [facility] medical mall at 1230 pm.  Patient was instructed to not hold her Eliquis for procedure- per Radiology Staff, Maudie Mercury.  Patient was given this information over the phone. She confirms information and verbalizes understanding.

## 2017-03-24 NOTE — Discharge Instructions (Signed)
Thoracentesis, Care After Refer to this sheet in the next few weeks. These instructions provide you with information about caring for yourself after your procedure. Your health care provider may also give you more specific instructions. Your treatment has been planned according to current medical practices, but problems sometimes occur. Call your health care provider if you have any problems or questions after your procedure. What can I expect after the procedure? After your procedure, it is common to have pain at the puncture site. Follow these instructions at home:  Take medicines only as directed by your health care provider.  You may return to your normal diet and normal activities as directed by your health care provider.  Drink enough fluid to keep your urine clear or pale yellow.  Do not take baths, swim, or use a hot tub until your health care provider approves.  Follow your health care provider's instructions about:  Puncture site care.  Bandage (dressing) changes and removal.  Check your puncture site every day for signs of infection. Watch for:  Redness, swelling, or pain.  Fluid, blood, or pus.  Keep all follow-up visits as directed by your health care provider. This is important. Contact a health care provider if:  You have redness, swelling, or pain at your puncture site.  You have fluid, blood, or pus coming from your puncture site.  You have a fever.  You have chills.  You have nausea or vomiting.  You have trouble breathing.  You develop a worsening cough. Get help right away if:  You have extreme shortness of breath.  You develop chest pain.  You faint or feel light-headed. This information is not intended to replace advice given to you by your health care provider. Make sure you discuss any questions you have with your health care provider. Document Released: 12/15/2014 Document Revised: 07/26/2016 Document Reviewed: 09/05/2014 Elsevier  Interactive Patient Education  2017 [employer].

## 2017-03-25 ENCOUNTER — Other Ambulatory Visit (HOSPITAL_COMMUNITY): Payer: Self-pay | Admitting: Interventional Radiology

## 2017-03-25 ENCOUNTER — Ambulatory Visit
Admission: RE | Admit: 2017-03-25 | Discharge: 2017-03-25 | Disposition: A | Discharge status: Home / Self Care | Payer: Managed Care, Other (non HMO) | Source: Ambulatory Visit | Attending: Interventional Radiology | Admitting: Interventional Radiology

## 2017-03-25 ENCOUNTER — Ambulatory Visit
Admission: RE | Admit: 2017-03-25 | Discharge: 2017-03-25 | Disposition: A | Discharge status: Home / Self Care | Payer: Managed Care, Other (non HMO) | Source: Ambulatory Visit | Attending: Cardiothoracic Surgery | Admitting: Cardiothoracic Surgery

## 2017-03-25 DIAGNOSIS — J90 Pleural effusion, not elsewhere classified: Secondary | ICD-10-CM

## 2017-03-25 NOTE — Procedures (Signed)
Pre procedural Dx: Symptomatic left sided pleural effusion Post procedural Dx: Same  Successful US guided left sided thoracentesis yielding 250 L of serous pleural fluid.   Samples sent to lab for analysis.  EBL: None  Complications: Patient tolerate the procedure poorly with development of midline chest pain, and as such, an incomplete thoracentesis was performed.  Ronny Bacon, MD Pager #: [phone]

## 2017-03-26 LAB — CYTOLOGY - NON PAP

## 2017-03-31 ENCOUNTER — Telehealth: Payer: Self-pay

## 2017-03-31 ENCOUNTER — Ambulatory Visit: Payer: Managed Care, Other (non HMO) | Admitting: Unknown Physician Specialty

## 2017-03-31 NOTE — Telephone Encounter (Signed)
Dr. Genevive Bi called patient to let her that the fluid that was drained from her lungs was negative for malignancy. He told her that if she needed anything else to please give [place] a call. Patient understood and had no further questions.  Dr. Genevive Bi reminded patient to follow up with Dr. [name].

## 2017-04-03 ENCOUNTER — Ambulatory Visit: Payer: Managed Care, Other (non HMO) | Admitting: Unknown Physician Specialty

## 2017-04-09 ENCOUNTER — Inpatient Hospital Stay: Payer: Managed Care, Other (non HMO) | Attending: Oncology

## 2017-04-09 ENCOUNTER — Inpatient Hospital Stay: Payer: Managed Care, Other (non HMO)

## 2017-04-09 DIAGNOSIS — Z85118 Personal history of other malignant neoplasm of bronchus and lung: Secondary | ICD-10-CM | POA: Diagnosis present

## 2017-04-09 DIAGNOSIS — Z452 Encounter for adjustment and management of vascular access device: Secondary | ICD-10-CM | POA: Insufficient documentation

## 2017-04-09 DIAGNOSIS — Z95828 Presence of other vascular implants and grafts: Secondary | ICD-10-CM

## 2017-04-09 MED ORDER — HEPARIN SOD (PORK) LOCK FLUSH 100 UNIT/ML IV SOLN
500.0000 [IU] | Freq: Once | INTRAVENOUS | Status: AC
  Administered 2017-04-09: 500 [IU] via INTRAVENOUS

## 2017-04-09 MED ORDER — SODIUM CHLORIDE 0.9% FLUSH
10.0000 mL | INTRAVENOUS | Status: DC | PRN
  Administered 2017-04-09: 10 mL via INTRAVENOUS
  Filled 2017-04-09: qty 10

## 2017-04-09 NOTE — Progress Notes (Signed)
Survivorship Care Plan visit completed.  Treatment summary reviewed and given to patient.  ASCO answers booklet reviewed and given to patient.  CARE program and Cancer Transitions discussed with patient along with other resources cancer center offers to patients and caregivers.  Patient verbalized understanding.    

## 2017-04-10 ENCOUNTER — Ambulatory Visit (INDEPENDENT_AMBULATORY_CARE_PROVIDER_SITE_OTHER): Payer: Managed Care, Other (non HMO) | Admitting: Unknown Physician Specialty

## 2017-04-10 ENCOUNTER — Encounter: Payer: Self-pay | Admitting: Unknown Physician Specialty

## 2017-04-10 VITALS — BP 120/74 | HR 128 | Temp 98.2°F | Wt 135.8 lb

## 2017-04-10 DIAGNOSIS — R059 Cough, unspecified: Secondary | ICD-10-CM

## 2017-04-10 DIAGNOSIS — R05 Cough: Secondary | ICD-10-CM

## 2017-04-10 DIAGNOSIS — E538 Deficiency of other specified B group vitamins: Secondary | ICD-10-CM

## 2017-04-10 DIAGNOSIS — G4700 Insomnia, unspecified: Secondary | ICD-10-CM

## 2017-04-10 DIAGNOSIS — E039 Hypothyroidism, unspecified: Secondary | ICD-10-CM | POA: Diagnosis not present

## 2017-04-10 MED ORDER — GUAIFENESIN-CODEINE 100-10 MG/5ML PO SOLN: 10.0000 mL | Freq: Three times a day (TID) | ORAL | 0 refills | Status: DC | PRN

## 2017-04-10 NOTE — Assessment & Plan Note (Signed)
Check labs 

## 2017-04-10 NOTE — Assessment & Plan Note (Signed)
Secondary to complications related to lung cancer and treatments.  Fill Tussinex but discussed this cannot be a chronc midications.

## 2017-04-10 NOTE — Assessment & Plan Note (Signed)
On Ambien for years.  Encouraged pt to wean down.

## 2017-04-10 NOTE — Progress Notes (Signed)
BP 120/74   Pulse (!) 128   Temp 98.2 F (36.8 C)   Wt 135 lb 12.8 oz (61.6 kg)   LMP  (LMP Unknown)   SpO2 97%   BMI 24.06 kg/m    Subjective:    Patient ID: Jill Gross, female    DOB: 1956/11/26, 60 y.o.   MRN: [id number]  HPI: Jill Gross is a 60 y.o. female  Chief Complaint  Patient presents with  . Hypothyroidism    2 month f/up  . Medication Refill    pt states she needs a refill on cough syrup   Hypothyroid Pt needs thyroid checked.  Currently on 137 mcgs of Lovothyroxine.  Weight has stabilized.    Insomnia Continuing with Ambien every night.  States she sleeps well with the Ambien.  Husband is always with her.    Cough  States the cough medications helps.  She states she needs it about twice or 3 times a week.  It got better after the thoracentesis.    Hypoxia Wearing 2 L of O2.  Uses it as needed.  Recent thoracentesis 2 weeks ago with a large pleural effusion.    Weakness Was never able to get home PT and unable to get transportation to the hospital.    Relevant past medical, surgical, family and social history reviewed and updated as indicated. Interim medical history since our last visit reviewed. Allergies and medications reviewed and updated.  Review of Systems  Per HPI unless specifically indicated above     Objective:    BP 120/74   Pulse (!) 128   Temp 98.2 F (36.8 C)   Wt 135 lb 12.8 oz (61.6 kg)   LMP  (LMP Unknown)   SpO2 97%   BMI 24.06 kg/m   Wt Readings from Last 3 Encounters:  04/10/17 135 lb 12.8 oz (61.6 kg)  03/13/17 136 lb (61.7 kg)  03/05/17 136 lb (61.7 kg)    Physical Exam  Constitutional: She is oriented to person, place, and time. She appears well-developed and well-nourished. No distress.  HENT:  Head: Normocephalic and atraumatic.  Eyes: Conjunctivae and lids are normal. Right eye exhibits no discharge. Left eye exhibits no discharge. No scleral icterus.  Neck: Normal range of motion. Neck supple. No JVD  present. Carotid bruit is not present.  Cardiovascular: Normal rate, regular rhythm and normal heart sounds.   Pulmonary/Chest: Effort normal. She has decreased breath sounds in the left middle field and the left lower field.  Abdominal: Normal appearance. There is no splenomegaly or hepatomegaly.  Musculoskeletal: Normal range of motion.  Neurological: She is alert and oriented to person, place, and time.  Skin: Skin is warm, dry and intact. No rash noted. No pallor.  Psychiatric: She has a normal mood and affect. Her behavior is normal. Judgment and thought content normal.      Assessment & Plan:   Problem List Items Addressed This Visit      Unprioritized   Cough    Secondary to complications related to lung cancer and treatments.  Fill Tussinex but discussed this cannot be a chronc midications.        Hypothyroidism - Primary    Check labs      Relevant Orders   Thyroid Panel With TSH   Insomnia    On Ambien for years.  Encouraged pt to wean down.            Follow up plan: Return in about 6  months (around 10/11/2017).

## 2017-04-11 LAB — THYROID PANEL WITH TSH
FREE THYROXINE INDEX: 4.2 (ref 1.2–4.9)
T3 Uptake Ratio: 29 % (ref 24–39)
T4 TOTAL: 14.5 ug/dL — AB (ref 4.5–12.0)
TSH: 0.05 u[IU]/mL — ABNORMAL LOW (ref 0.450–4.500)

## 2017-04-11 IMAGING — MR MR HEAD WO/W CM
10 of 13 series · 37 of 48 positions shown · IV contrast (multihance)
Comparison: None.

CLINICAL DATA: Recently diagnosed left lung cancer.  Staging.

EXAM:
MRI HEAD WITHOUT AND WITH CONTRAST
TECHNIQUE: Multiplanar, multiecho pulse sequences of the brain and surrounding
structures were obtained without and with intravenous contrast.
CONTRAST:  15mL MULTIHANCE GADOBENATE DIMEGLUMINE 529 MG/ML IV SOLN

[Series 4: DWI · axial · 4.0mm · 0.94mm/px · z∈[-86,+90]mm · 4 of 45 slices shown (1 of 4)]
[im 1/45]
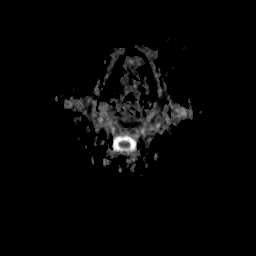
[im 15/45]
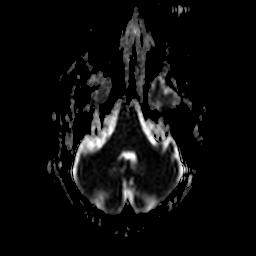
[im 30/45]
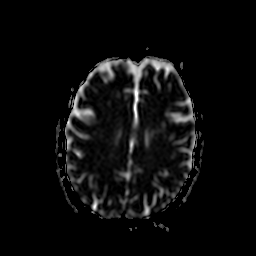
[im 45/45]
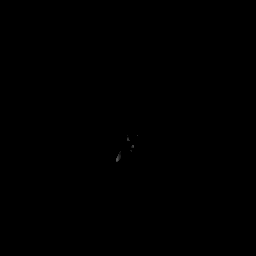

[Series 6: DWI · coronal · 5.0mm · 1.80mm/px · 4 of 39 slices shown (2 of 4)]
[im 1/39]
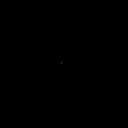
[im 13/39]
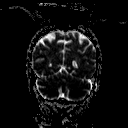
[im 26/39]
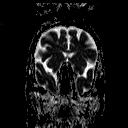
[im 39/39]
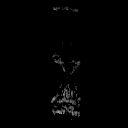

[Series 7: DWI · axial · 4.0mm · 0.94mm/px · z∈[-86,+86]mm · 4 of 44 slices shown (3 of 4)]
[im 1/44]
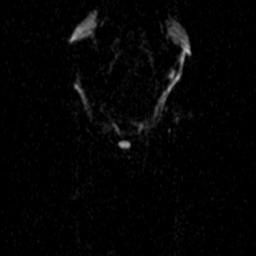
[im 15/44]
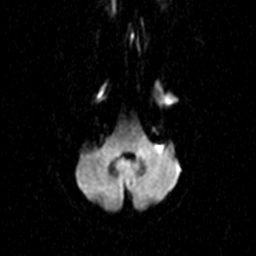
[im 29/44]
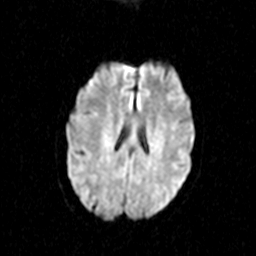
[im 44/44]
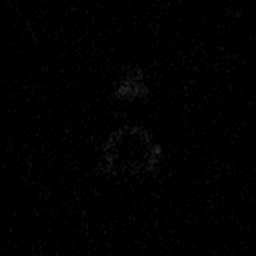

[Series 8: DWI · coronal · 5.0mm · 1.80mm/px · 4 of 36 slices shown (4 of 4)]
[im 1/36]
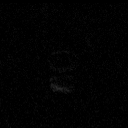
[im 12/36]
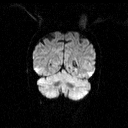
[im 24/36]
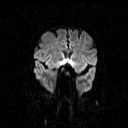
[im 36/36]
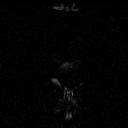

[Series 9: T2 · axial · 5.0mm · 0.45mm/px · z∈[-77,+92]mm · 3 of 27 slices shown (1 of 2)]
[im 1/27]
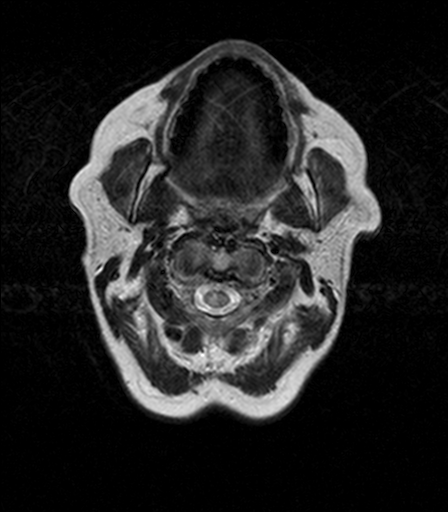
[im 14/27]
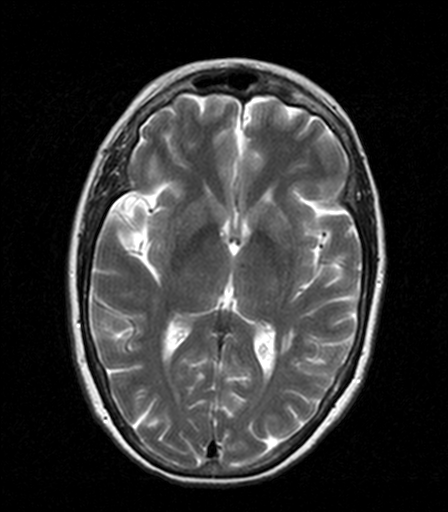
[im 27/27]
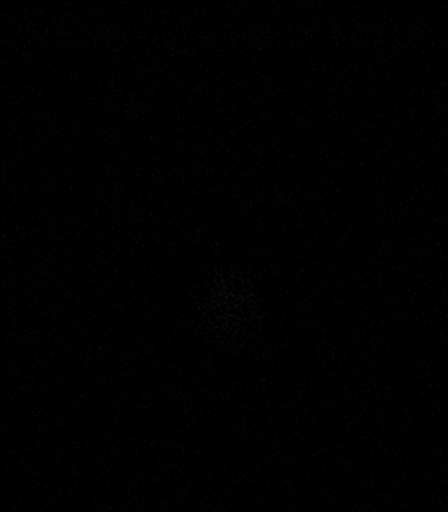

[Series 10: FLAIR · axial · 5.0mm · 0.90mm/px · z∈[-77,+92]mm · 3 of 27 slices shown]
[im 1/27]
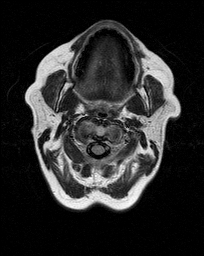
[im 14/27]
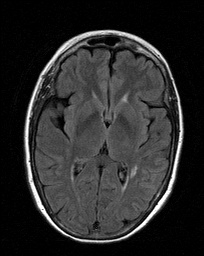
[im 27/27]
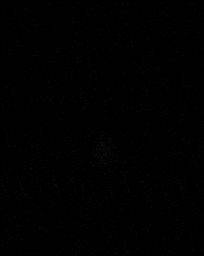

[Series 11: T2 · axial · 5.0mm · 0.45mm/px · z∈[-77,+92]mm · 3 of 27 slices shown (2 of 2)]
[im 1/27]
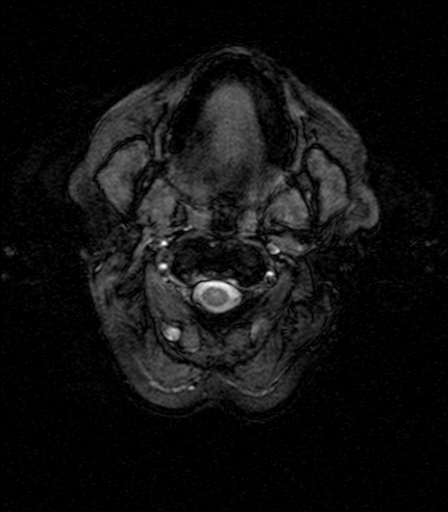
[im 14/27]
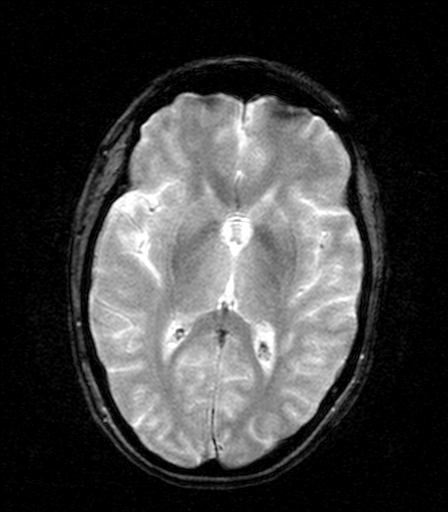
[im 27/27]
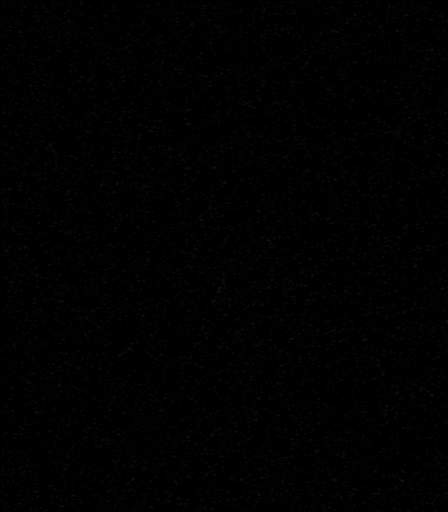

[Series 14: T1 post-contrast · axial · 3.0mm · 0.45mm/px · z∈[-87,+102]mm · 6 of 64 slices shown (1 of 3)]
[im 1/64]
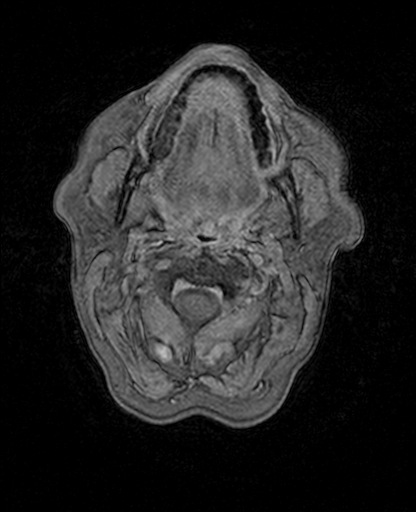
[im 13/64]
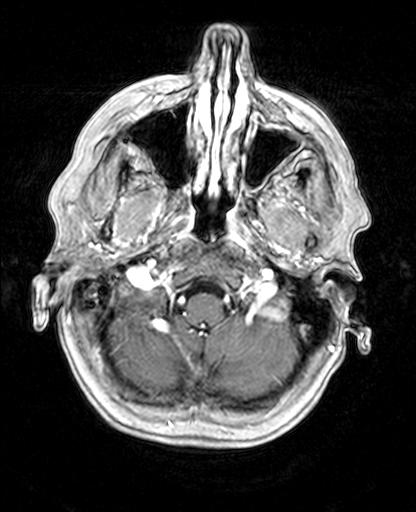
[im 26/64]
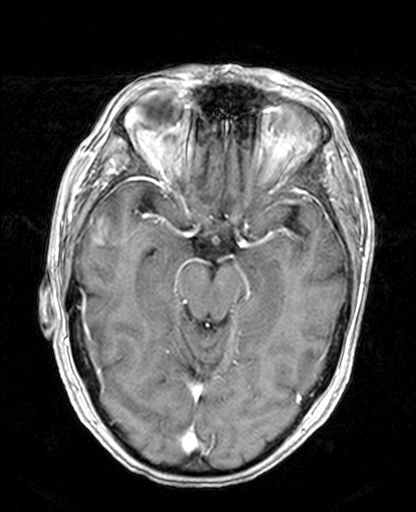
[im 38/64]
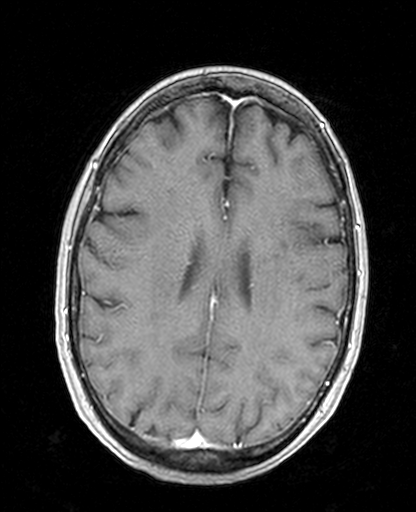
[im 51/64]
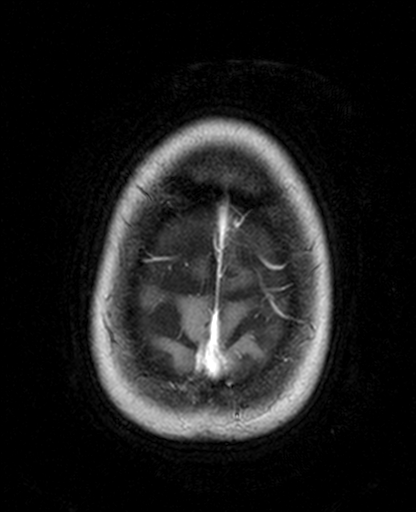
[im 64/64]
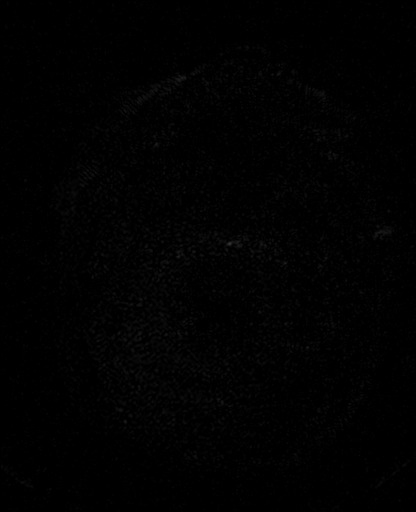

[Series 15: T1 post-contrast · coronal · 5.0mm · 0.45mm/px · 3 of 31 slices shown (2 of 3)]
[im 1/31]
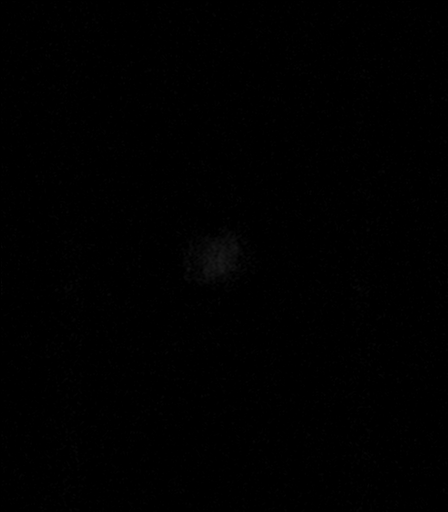
[im 16/31]
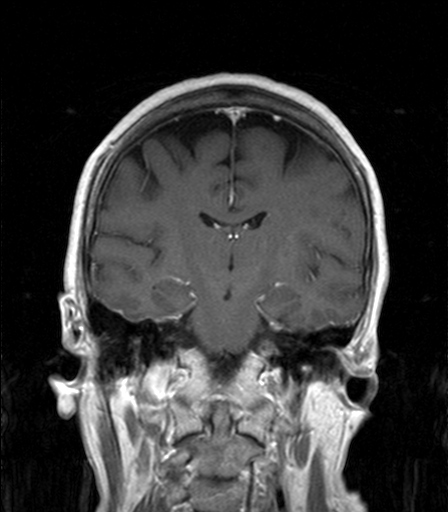
[im 31/31]
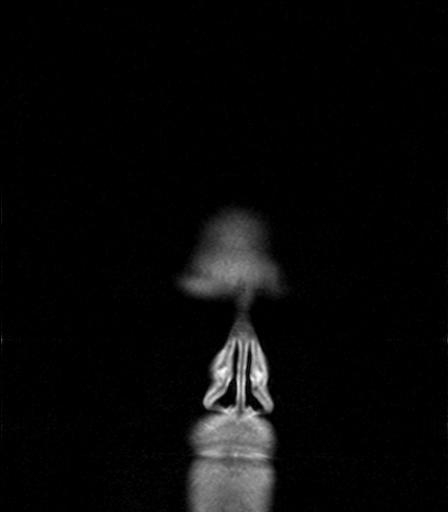

[Series 16: T1 post-contrast · sagittal · 5.0mm · 0.45mm/px · 3 of 27 slices shown (3 of 3)]
[im 1/27]
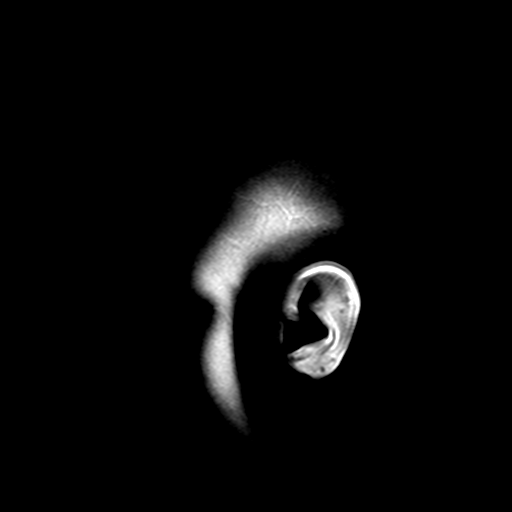
[im 14/27]
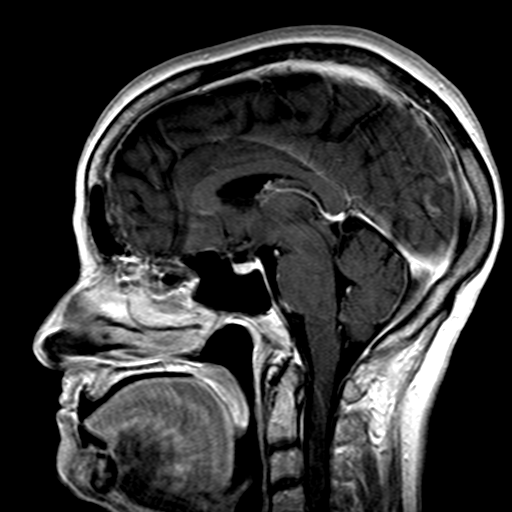
[im 27/27]
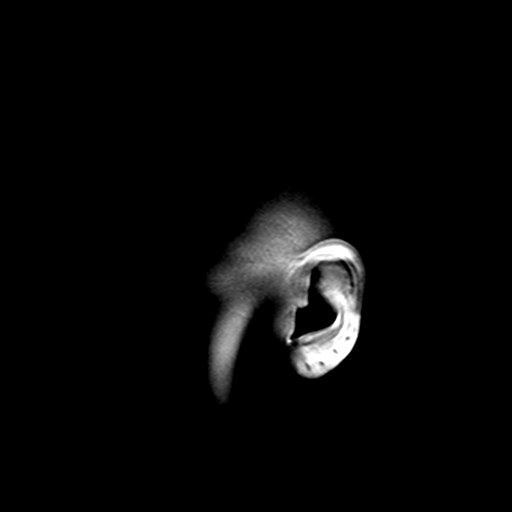

[37 of 48 positions shown; findings below may reference images not displayed]

FINDINGS: There is no evidence of acute infarct, intracranial hemorrhage,
mass, midline shift, or extra-axial fluid collection. Ventricles and
sulci are normal. Patchy T2 hyperintensities are present in the
subcortical and deep cerebral white matter bilaterally, nonspecific
but compatible with moderate chronic small vessel ischemic disease.
A chronic lacunar infarct is noted in the left thalamus. No abnormal
enhancement is identified.

Orbits are unremarkable. Mild left maxillary sinus mucosal
thickening and a small right mastoid effusion are noted. Major
intracranial vascular flow voids are preserved. No suspicious skull
lesions are identified.
IMPRESSION: 1. No evidence of intracranial metastases.
2. Moderate chronic small vessel ischemic disease.

## 2017-04-12 IMAGING — DX DG CHEST 1V PORT
1 series · 1 of 1 positions shown · non-contrast
Comparison: December 26, 2015

CLINICAL DATA: Port-A-Cath placement

EXAM:
CHEST 1 VIEW -intraoperative fluoroscopy

[chest ap]
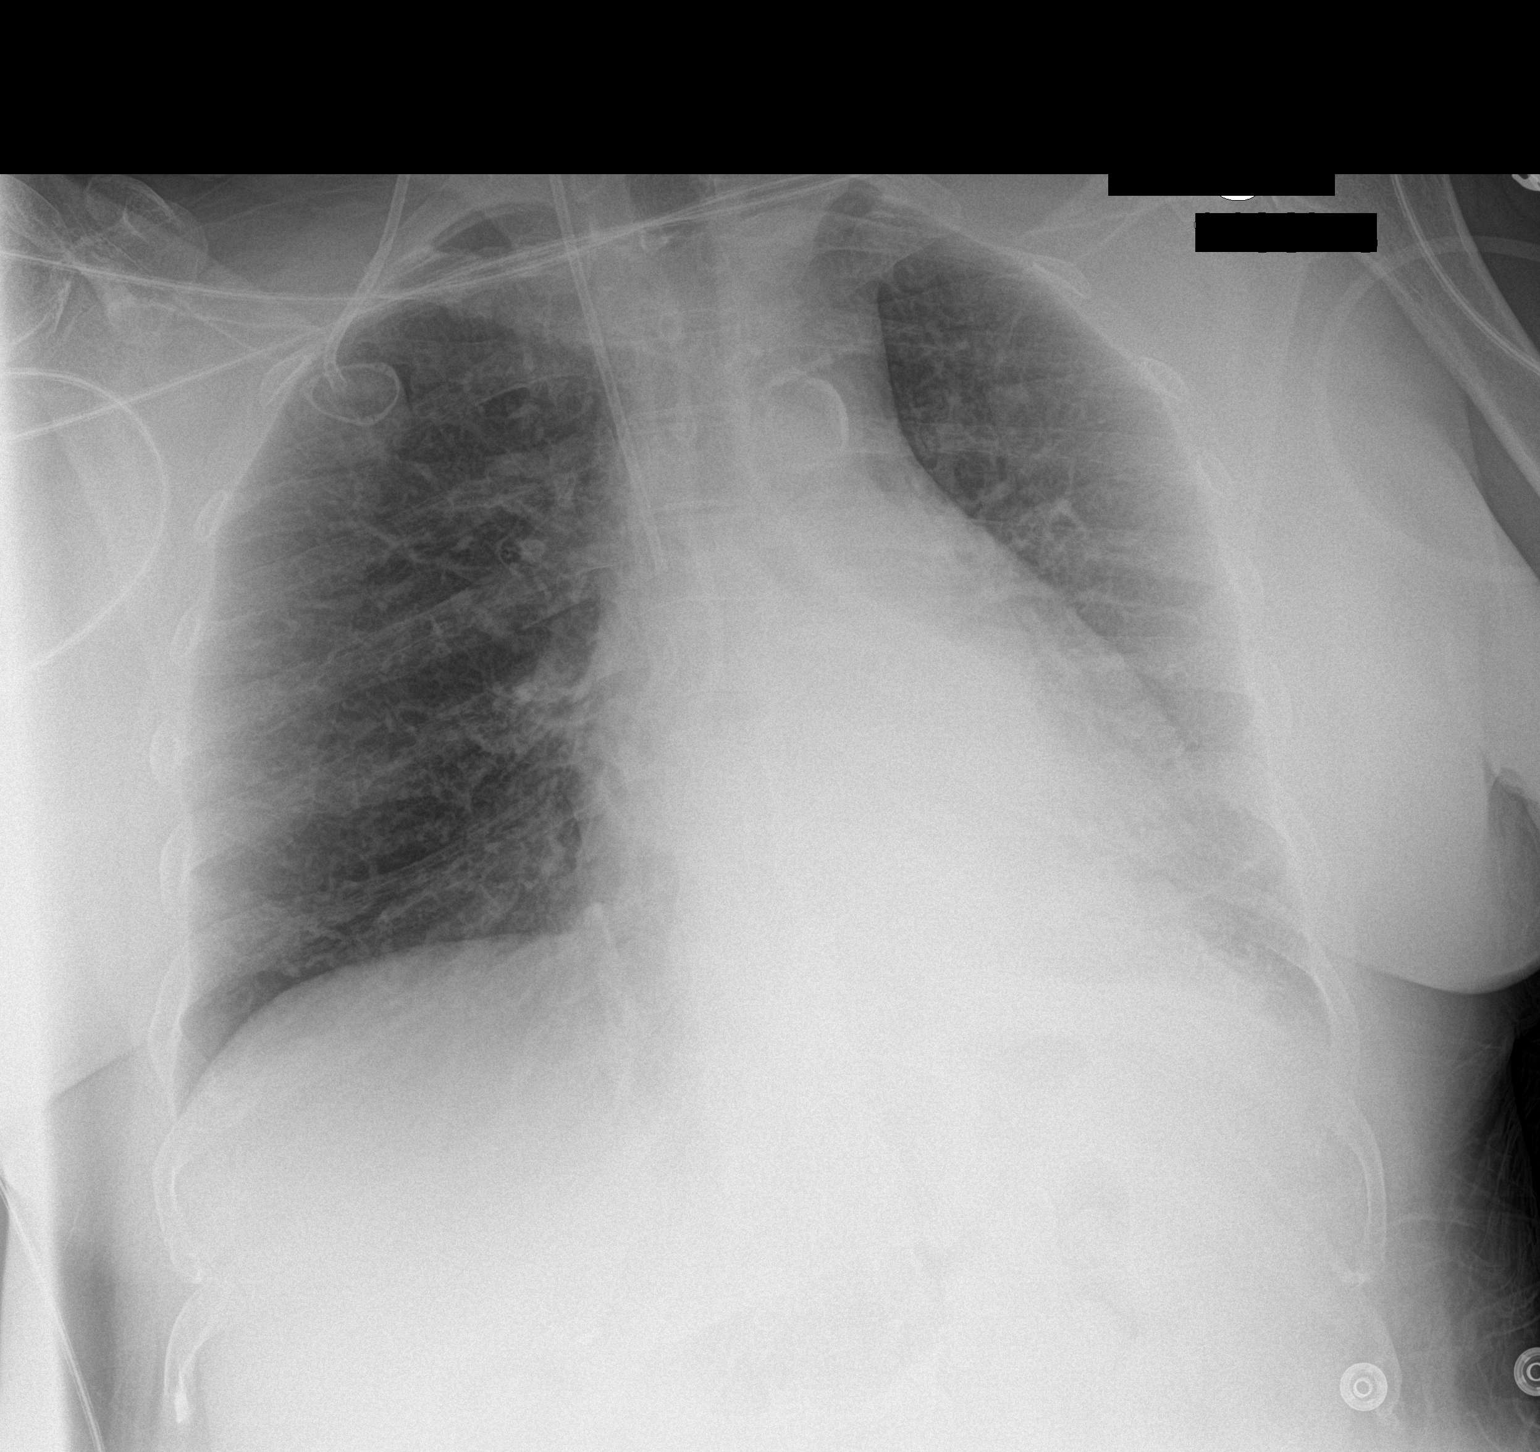

[1 of 1 positions shown; findings below may reference images not displayed]

FINDINGS: Port-A-Cath tip is in the superior vena cava. No pneumothorax. There
is left base atelectasis. There is no edema or consolidation. Heart
is mildly enlarged with pulmonary vascularity within normal limits.
No demonstrable adenopathy. There is atherosclerotic calcification
in the aorta. No bone lesions.
IMPRESSION: Port-A-Cath tip in superior vena cava. No pneumothorax. Stable
cardiac prominence. Left base atelectasis. Lungs elsewhere clear.

## 2017-04-13 ENCOUNTER — Other Ambulatory Visit: Payer: Self-pay | Admitting: Unknown Physician Specialty

## 2017-04-13 MED ORDER — LEVOTHYROXINE SODIUM 125 MCG PO TABS: 125.0000 ug | ORAL_TABLET | Freq: Every day | ORAL | 0 refills | Status: DC

## 2017-04-15 ENCOUNTER — Encounter: Payer: Self-pay | Admitting: Oncology

## 2017-04-16 ENCOUNTER — Other Ambulatory Visit: Payer: Self-pay | Admitting: *Deleted

## 2017-04-16 ENCOUNTER — Telehealth: Payer: Self-pay | Admitting: Unknown Physician Specialty

## 2017-04-16 NOTE — Telephone Encounter (Signed)
Patient called in regards to seeing if he needed to change her thyroid medication. Patient wanted to know if she would be prescribed a new medication or if she could get back on the same medication. Please Advise.  Thank you.

## 2017-04-16 NOTE — Telephone Encounter (Signed)
Jill Gross, patient is supposed to stop taking the 137 mcg levothyroxine and start taking the 125 mcg levothyroxine correct?

## 2017-04-17 NOTE — Telephone Encounter (Signed)
Message relayed to patient. Verbalized understanding and denied questions.   

## 2017-04-17 NOTE — Telephone Encounter (Signed)
yes

## 2017-04-20 ENCOUNTER — Telehealth: Payer: Self-pay | Admitting: Unknown Physician Specialty

## 2017-04-20 MED ORDER — VALACYCLOVIR HCL 1 G PO TABS: 1000.0000 mg | ORAL_TABLET | Freq: Three times a day (TID) | ORAL | 0 refills | Status: DC

## 2017-04-20 NOTE — Telephone Encounter (Signed)
Called and spoke with patient's husband. He states that the patient has some red spots on her left side and left side of back that came up a couple of days ago. He states that the patient has complained of them hurting and have pain under left arm. I told the patient's husband that I was sending a message to Malachy Mood to let her know and that I would give them a call back with what Malachy Mood says.

## 2017-04-20 NOTE — Telephone Encounter (Signed)
Please see other phone note documentation.

## 2017-04-20 NOTE — Telephone Encounter (Signed)
Called and left patient's husband a VM asking for him to please return my call.

## 2017-04-20 NOTE — Telephone Encounter (Signed)
Medication is not showing up in chart. Did you send it in to the pharmacy?

## 2017-04-20 NOTE — Telephone Encounter (Signed)
Sounds like shingles.  I will call in Valtrex

## 2017-04-20 NOTE — Telephone Encounter (Signed)
Patients husband called to speak with [place]   Thanks   Lawrence

## 2017-04-20 NOTE — Telephone Encounter (Signed)
Patients husband returned call and I let him know that [facility] sent in valtrex for the patient to take.

## 2017-04-20 NOTE — Telephone Encounter (Signed)
It's pended

## 2017-04-22 ENCOUNTER — Telehealth: Payer: Self-pay | Admitting: Unknown Physician Specialty

## 2017-04-22 ENCOUNTER — Other Ambulatory Visit: Payer: Self-pay | Admitting: Family Medicine

## 2017-04-22 ENCOUNTER — Other Ambulatory Visit: Payer: Self-pay | Admitting: Unknown Physician Specialty

## 2017-04-22 MED ORDER — VALACYCLOVIR HCL 500 MG PO TABS: 1000.0000 mg | ORAL_TABLET | Freq: Three times a day (TID) | ORAL | 0 refills | Status: DC

## 2017-04-22 NOTE — Telephone Encounter (Signed)
Can the pharmacist let us know, since they know pill size,  what is an acceptable smaller pill alternative?

## 2017-04-22 NOTE — Telephone Encounter (Signed)
Called and let patient know what Dr. Wynetta Emery said about crushing tablets and that a new rx was sent in for her.

## 2017-04-22 NOTE — Telephone Encounter (Signed)
OK to crush pills as well. Rx sent to her pharmacy

## 2017-04-22 NOTE — Telephone Encounter (Signed)
Called and spoke to the pharmacist at [place]. I explained to him what was going on and he stated that 500 mg Valtrex were about half the size of the 1000 mg and acyclovir came in capsules as another alternative.

## 2017-04-22 NOTE — Telephone Encounter (Signed)
Routing to provider. Patient was given Valtrex.

## 2017-04-22 NOTE — Telephone Encounter (Signed)
Patient was given medication for shingles but cannot swallow the pills.  She is hoping she can be given something smaller.  [facility]    Thanks

## 2017-04-30 ENCOUNTER — Telehealth: Payer: Self-pay | Admitting: Unknown Physician Specialty

## 2017-04-30 NOTE — Telephone Encounter (Signed)
Needs to be seen. This does not sound like shingles

## 2017-04-30 NOTE — Telephone Encounter (Signed)
Please see message from front desk staff

## 2017-04-30 NOTE — Telephone Encounter (Signed)
Patient called to see if she could get a prescription for the itching and pain that has moved from the left side of her chest to under her underarm to the back of her shoulder.   Please Advise.   Thank you.

## 2017-04-30 NOTE — Telephone Encounter (Signed)
Please advise 

## 2017-04-30 NOTE — Telephone Encounter (Signed)
Patient called again regarding the itching and pain question to see if someone could prescribe something for this.  She has taken her last pill for the shingles today.  [facility]  [phone] if any questions--Cherrie

## 2017-05-01 MED ORDER — VALACYCLOVIR HCL 500 MG PO TABS: 1000.0000 mg | ORAL_TABLET | Freq: Three times a day (TID) | ORAL | 0 refills | Status: DC

## 2017-05-01 NOTE — Telephone Encounter (Signed)
Called patient and notified her that medication refill was sent to the pharmacy.

## 2017-05-01 NOTE — Telephone Encounter (Signed)
Called patient to inform her that the provider would like to come In and see her for an appointment. Patient denied the request to make an appointment. Patient stated that she has no transportation and husband cannot take her because he has missed to many days at work. Offered patient to go to the ER or hospital to get looked at due to pain in arm also/ Patient denied offer and stated that the medication that provider prescribed is helping ease the pain and itching but the pain and itching is still there. Patient states that her family member who is a Equities trader looked at her arm and informed her it was shingles. Patient was adamant that she is okay and does not need an appointment and wanted to make sure I provided this information to provider.

## 2017-05-01 NOTE — Telephone Encounter (Signed)
Refill of medicine sent to her pharmacy. Please let her know

## 2017-05-13 ENCOUNTER — Ambulatory Visit
Admission: RE | Admit: 2017-05-13 | Discharge: 2017-05-13 | Disposition: A | Discharge status: Home / Self Care | Payer: Managed Care, Other (non HMO) | Source: Ambulatory Visit | Attending: Oncology | Admitting: Oncology

## 2017-05-13 ENCOUNTER — Inpatient Hospital Stay: Payer: Managed Care, Other (non HMO) | Attending: Oncology

## 2017-05-13 DIAGNOSIS — J90 Pleural effusion, not elsewhere classified: Secondary | ICD-10-CM | POA: Insufficient documentation

## 2017-05-13 DIAGNOSIS — R5383 Other fatigue: Secondary | ICD-10-CM | POA: Insufficient documentation

## 2017-05-13 DIAGNOSIS — F329 Major depressive disorder, single episode, unspecified: Secondary | ICD-10-CM | POA: Insufficient documentation

## 2017-05-13 DIAGNOSIS — Z9221 Personal history of antineoplastic chemotherapy: Secondary | ICD-10-CM | POA: Insufficient documentation

## 2017-05-13 DIAGNOSIS — Z86711 Personal history of pulmonary embolism: Secondary | ICD-10-CM | POA: Diagnosis not present

## 2017-05-13 DIAGNOSIS — R531 Weakness: Secondary | ICD-10-CM | POA: Diagnosis not present

## 2017-05-13 DIAGNOSIS — D649 Anemia, unspecified: Secondary | ICD-10-CM | POA: Diagnosis not present

## 2017-05-13 DIAGNOSIS — E538 Deficiency of other specified B group vitamins: Secondary | ICD-10-CM | POA: Diagnosis not present

## 2017-05-13 DIAGNOSIS — F419 Anxiety disorder, unspecified: Secondary | ICD-10-CM | POA: Diagnosis not present

## 2017-05-13 DIAGNOSIS — G629 Polyneuropathy, unspecified: Secondary | ICD-10-CM | POA: Diagnosis not present

## 2017-05-13 DIAGNOSIS — M8580 Other specified disorders of bone density and structure, unspecified site: Secondary | ICD-10-CM | POA: Diagnosis not present

## 2017-05-13 DIAGNOSIS — J449 Chronic obstructive pulmonary disease, unspecified: Secondary | ICD-10-CM | POA: Insufficient documentation

## 2017-05-13 DIAGNOSIS — E785 Hyperlipidemia, unspecified: Secondary | ICD-10-CM | POA: Insufficient documentation

## 2017-05-13 DIAGNOSIS — K219 Gastro-esophageal reflux disease without esophagitis: Secondary | ICD-10-CM | POA: Diagnosis not present

## 2017-05-13 DIAGNOSIS — M545 Low back pain: Secondary | ICD-10-CM | POA: Insufficient documentation

## 2017-05-13 DIAGNOSIS — Z87891 Personal history of nicotine dependence: Secondary | ICD-10-CM | POA: Insufficient documentation

## 2017-05-13 DIAGNOSIS — R0602 Shortness of breath: Secondary | ICD-10-CM | POA: Diagnosis not present

## 2017-05-13 DIAGNOSIS — E876 Hypokalemia: Secondary | ICD-10-CM | POA: Insufficient documentation

## 2017-05-13 DIAGNOSIS — E039 Hypothyroidism, unspecified: Secondary | ICD-10-CM | POA: Diagnosis not present

## 2017-05-13 DIAGNOSIS — G4700 Insomnia, unspecified: Secondary | ICD-10-CM | POA: Insufficient documentation

## 2017-05-13 DIAGNOSIS — Z923 Personal history of irradiation: Secondary | ICD-10-CM | POA: Diagnosis not present

## 2017-05-13 DIAGNOSIS — Z79899 Other long term (current) drug therapy: Secondary | ICD-10-CM | POA: Insufficient documentation

## 2017-05-13 DIAGNOSIS — Z95828 Presence of other vascular implants and grafts: Secondary | ICD-10-CM

## 2017-05-13 DIAGNOSIS — C3432 Malignant neoplasm of lower lobe, left bronchus or lung: Secondary | ICD-10-CM | POA: Insufficient documentation

## 2017-05-13 LAB — CBC WITH DIFFERENTIAL/PLATELET
Basophils Absolute: 0 10*3/uL (ref 0–0.1)
Basophils Relative: 1 %
Eosinophils Absolute: 0.1 10*3/uL (ref 0–0.7)
Eosinophils Relative: 1 %
HEMATOCRIT: 30.6 % — AB (ref 35.0–47.0)
HEMOGLOBIN: 10.5 g/dL — AB (ref 12.0–16.0)
LYMPHS ABS: 1.5 10*3/uL (ref 1.0–3.6)
LYMPHS PCT: 30 %
MCH: 30.9 pg (ref 26.0–34.0)
MCHC: 34.4 g/dL (ref 32.0–36.0)
MCV: 89.8 fL (ref 80.0–100.0)
MONOS PCT: 8 %
Monocytes Absolute: 0.4 10*3/uL (ref 0.2–0.9)
NEUTROS ABS: 3.1 10*3/uL (ref 1.4–6.5)
NEUTROS PCT: 60 %
Platelets: 201 10*3/uL (ref 150–440)
RBC: 3.41 MIL/uL — ABNORMAL LOW (ref 3.80–5.20)
RDW: 17.4 % — ABNORMAL HIGH (ref 11.5–14.5)
WBC: 5.2 10*3/uL (ref 3.6–11.0)

## 2017-05-13 LAB — COMPREHENSIVE METABOLIC PANEL
ALK PHOS: 66 U/L (ref 38–126)
ALT: 10 U/L — ABNORMAL LOW (ref 14–54)
ANION GAP: 7 (ref 5–15)
AST: 23 U/L (ref 15–41)
Albumin: 3.6 g/dL (ref 3.5–5.0)
BILIRUBIN TOTAL: 0.3 mg/dL (ref 0.3–1.2)
BUN: 8 mg/dL (ref 6–20)
CALCIUM: 9.4 mg/dL (ref 8.9–10.3)
CO2: 28 mmol/L (ref 22–32)
CREATININE: 0.7 mg/dL (ref 0.44–1.00)
Chloride: 98 mmol/L — ABNORMAL LOW (ref 101–111)
Glucose, Bld: 129 mg/dL — ABNORMAL HIGH (ref 65–99)
Potassium: 3.7 mmol/L (ref 3.5–5.1)
Sodium: 133 mmol/L — ABNORMAL LOW (ref 135–145)
TOTAL PROTEIN: 7.2 g/dL (ref 6.5–8.1)

## 2017-05-13 MED ORDER — SODIUM CHLORIDE 0.9% FLUSH
10.0000 mL | INTRAVENOUS | Status: DC | PRN
  Administered 2017-05-13: 10 mL via INTRAVENOUS
  Filled 2017-05-13: qty 10

## 2017-05-13 MED ORDER — IOPAMIDOL (ISOVUE-300) INJECTION 61%
75.0000 mL | Freq: Once | INTRAVENOUS | Status: AC | PRN
  Administered 2017-05-13: 75 mL via INTRAVENOUS

## 2017-05-13 MED ORDER — HEPARIN SOD (PORK) LOCK FLUSH 100 UNIT/ML IV SOLN
500.0000 [IU] | Freq: Once | INTRAVENOUS | Status: AC
  Administered 2017-05-13: 500 [IU] via INTRAVENOUS

## 2017-05-18 ENCOUNTER — Ambulatory Visit: Payer: Managed Care, Other (non HMO) | Admitting: Oncology

## 2017-05-20 NOTE — Progress Notes (Signed)
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  INTERVAL HISTORY: Patient returns to clinic today for further evaluation and discussion of her imaging results. Her performance status remains decreased. She continues to have chronic shortness of breath. She has a fair appetite, but no further weight loss.  She also has continued neuropathy particularly in her fingertips. She has no other neurologic complaints. She denies any recent fevers. She denies any chest pain or hemoptysis. She denies any nausea, vomiting, constipation, or diarrhea. She has no urinary complaints. Patient feels generally terrible, but offers no further specific complaints.   REVIEW OF SYSTEMS:   Review of Systems  Constitutional: Positive for malaise/fatigue. Negative for fever and weight loss.  HENT: Negative for sore throat.   Eyes: Negative.   Respiratory: Positive for shortness of breath. Negative for cough and hemoptysis.   Cardiovascular: Negative.  Negative for chest pain and leg swelling.  Gastrointestinal: Negative for nausea and vomiting.  Genitourinary: Negative.   Musculoskeletal: Negative.   Skin: Negative.   Neurological: Positive for sensory change and weakness. Negative for dizziness and tingling.  Psychiatric/Behavioral: Negative.  Negative for depression. The patient is not nervous/anxious.     As per HPI. Otherwise, a complete review of systems is negative.  PAST MEDICAL HISTORY: Past Medical History:  Diagnosis Date  . Allergy     . Anemia    pernicious  . Anxiety   . Arthritis   . Atrophic vaginitis   . COPD (chronic obstructive pulmonary disease) ([place])   . Depression   . Diverticulosis   . Dyspnea   . Fatigue   . GERD (gastroesophageal reflux disease)   . History of chemotherapy 04/2016  . Hyperlipidemia   . Hypothyroid   . Insomnia   . Low back pain   . Lumbago   . Neuropathy due to chemotherapeutic drug ([place])   . Osteopenia   . Small cell carcinoma of left lung ([place]) 01/2016   rad + chemo tx's  . Tobacco use   . Vitamin B12 deficiency     PAST SURGICAL HISTORY: Past Surgical History:  Procedure Laterality Date  . ABDOMINAL HYSTERECTOMY    . ANKLE SURGERY     x4  . CARPAL TUNNEL RELEASE  April 2015  . CHOLECYSTECTOMY    . ENDOBRONCHIAL ULTRASOUND N/A 01/07/2016   Procedure: ENDOBRONCHIAL ULTRASOUND;  Surgeon: Vilinda Boehringer, MD;  Location: [facility] ORS;  Service: Cardiopulmonary;  Laterality: N/A;  . ENDOBRONCHIAL ULTRASOUND N/A 09/16/2016   Procedure: ENDOBRONCHIAL ULTRASOUND;  Surgeon: Vilinda Boehringer, MD;  Location: [facility] ORS;  Service: Cardiopulmonary;  Laterality: N/A;  . HERNIA REPAIR    . PORTACATH PLACEMENT Right 01/23/2016   Procedure: INSERTION PORT-A-CATH;  Surgeon: [name], MD;  Location: [facility] ORS;  Service: General;  Laterality: Right;    FAMILY HISTORY Family History  Problem Relation Age of Onset  . Emphysema Mother   . Diabetes Father        ADVANCED DIRECTIVES:    HEALTH MAINTENANCE: Social History  Substance Use Topics  . Smoking status: Former Smoker    Packs/day: 1.00  Years: 30.00    Types: Cigarettes    Quit date: 01/11/2016  . Smokeless tobacco: Never Used  . Alcohol use No     Allergies  Allergen Reactions  . Macrobid [Nitrofurantoin] Anaphylaxis  . Effexor [Venlafaxine]   . Hctz [Hydrochlorothiazide] Other (See Comments)    cramps  . Neurontin [Gabapentin]   . Paxil [Paroxetine Hcl]   . Pravachol [Pravastatin Sodium] Other (See Comments)     aching  . Prozac [Fluoxetine Hcl]   . Sulfur Other (See Comments)    "blisters in mouth"  . Wellbutrin [Bupropion] Itching  . Zoloft [Sertraline Hcl]     Current Outpatient Prescriptions  Medication Sig Dispense Refill  . ALPRAZolam (XANAX) 0.25 MG tablet Take 1 tablet (0.25 mg total) by mouth 2 (two) times daily as needed for anxiety. 60 tablet 1  . ELIQUIS 5 MG TABS tablet TAKE ONE TABLET BY MOUTH TWICE DAILY. 60 tablet 6  . levothyroxine (SYNTHROID, LEVOTHROID) 125 MCG tablet Take 1 tablet (125 mcg total) by mouth daily. 90 tablet 0  . ranitidine (ZANTAC) 150 MG tablet Take 150 mg by mouth 2 (two) times daily as needed for heartburn.    . simvastatin (ZOCOR) 10 MG tablet TAKE (1) TABLET BY MOUTH EVERY DAY AT 6PM 30 tablet 12  . zolpidem (AMBIEN) 10 MG tablet TAKE (1) TABLET BY MOUTH DAILY AT BEDTIME 30 tablet 2  . amitriptyline (ELAVIL) 10 MG tablet TAKE TWO TABLETS AT BEDTIME. 60 tablet 12  . guaiFENesin-codeine 100-10 MG/5ML syrup Take 10 mLs by mouth 3 (three) times daily as needed for cough. (Patient not taking: Reported on 05/21/2017) 120 mL 0  . ipratropium-albuterol (DUONEB) 0.5-2.5 (3) MG/3ML SOLN Take 3 mLs by nebulization every 6 (six) hours as needed. (Patient not taking: Reported on 05/21/2017) 360 mL 0  . oxyCODONE-acetaminophen (PERCOCET/ROXICET) 5-325 MG tablet Take 1 tablet by mouth every 4 (four) hours as needed for severe pain. 30 tablet 0  . promethazine (PHENERGAN) 25 MG tablet Take 1 tablet (25 mg total) by mouth every 6 (six) hours as needed for nausea or vomiting. 30 tablet 2   Current Facility-Administered Medications  Medication Dose Route Frequency Provider Last Rate Last Dose  . cyanocobalamin ((VITAMIN B-12)) injection 1,000 mcg  1,000 mcg Intramuscular Q30 days [name], NP   1,000 mcg at 04/10/17 1601    OBJECTIVE: Vitals:   05/21/17 1503  BP: 122/79  Pulse: (!) 111  Resp: 16  Temp: 98.2 F (36.8 C)     Body mass index is 24.31 kg/m.    ECOG  FS:1 - Symptomatic but completely ambulatory  General: No acute distress. Sitting in wheelchair Eyes: Pink conjunctiva, anicteric sclera. HEENT: Oropharynx clear without erythema or exudate. Lungs: Clear to auscultation bilaterally. Heart: Tachycardia. No rubs, murmurs, or gallops. Musculoskeletal: No edema, cyanosis, or clubbing. Neuro: Alert, answering all questions appropriately. Cranial nerves grossly intact. Skin: No rashes or petechiae noted. Poor skin turgor. Psych: Normal affect.   LAB RESULTS:  Lab Results  Component Value Date   NA 133 (L) 05/13/2017   K 3.7 05/13/2017   CL 98 (L) 05/13/2017   CO2 28 05/13/2017   GLUCOSE 129 (H) 05/13/2017   BUN 8 05/13/2017   CREATININE 0.70 05/13/2017   CALCIUM 9.4 05/13/2017   PROT 7.2 05/13/2017   ALBUMIN 3.6 05/13/2017   AST 23 05/13/2017   ALT 10 (L) 05/13/2017   ALKPHOS 66 05/13/2017   BILITOT 0.3 05/13/2017   GFRNONAA >60 05/13/2017   GFRAA >  60 05/13/2017    Lab Results  Component Value Date   WBC 5.2 05/13/2017   NEUTROABS 3.1 05/13/2017   HGB 10.5 (L) 05/13/2017   HCT 30.6 (L) 05/13/2017   MCV 89.8 05/13/2017   PLT 201 05/13/2017     STUDIES: Ct Chest W Contrast  Result Date: 05/13/2017 CLINICAL DATA:  Followup LEFT lower lobe lung cancer. EXAM: CT CHEST WITH CONTRAST TECHNIQUE: Multidetector CT imaging of the chest was performed during intravenous contrast administration. CONTRAST:  49mL ISOVUE-300 IOPAMIDOL (ISOVUE-300) INJECTION 61% COMPARISON:  Chest CT 03/03/2017 FINDINGS: Cardiovascular: No significant vascular findings. Normal heart size. No pericardial effusion. Mediastinum/Nodes: No axillary or supraclavicular adenopathy. Port in the RIGHT chest. No mediastinal or hilar adenopathy. Esophagus normal. Lungs/Pleura: There is complete collapse of the LEFT upper lobe and LEFT lower lobe. Endobronchial lesion within the distal main LEFT bronchus (image 64, series 2). Moderate-sized LEFT effusion around collapsed  LEFT lung. RIGHT lung nodules and clear. Mild apical pleural-parenchymal nodular thickening on the RIGHT. Mild reticular pattern in the medial RIGHT upper lobe. No suspicious nodularity. Which Upper Abdomen: Limited view of the liver, kidneys, pancreas are unremarkable. Normal adrenal glands. Musculoskeletal: No aggressive osseous lesion. IMPRESSION: 1. Endobronchial lesion within the distal LEFT main bronchus with complete collapse of the LEFT upper lobe and LEFT lower lobe is similar to comparison exam. 2. Moderate LEFT effusion is also unchanged. 3. No evidence of malignancy in the RIGHT hemithorax. 4. No mediastinal adenopathy. Electronically Signed   By: Suzy Bouchard M.D.   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  PLAN:    1. 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 Pleural effusion is unchanged. Previously, bronchoscopy and FNA results on September 16, 2016 were negative for malignancy. Pleural fluid from thoracentesis on March 25, 2017 was also negative for recurrence. A referral has been sent back to pulmonology for further evaluation. No intervention is needed at this time. Return to clinic in 3 months with repeat imaging and further evaluation.  2. Pulmonary embolism: Likely secondary to increased sedentary lifestyle. Patient has now taken 6 months of Eliquis and has been instructed to discontinue treatment. 3. Poor appetite: Improving, monitor.  4. Hypokalemia: Continue oral supplementation as prescribed. 5. Peripheral neuropathy: Patient is no longer taking gabapentin. 6. Anemia: Patient's hemoglobin remains decreased, but stable. Monitor. 7. Thyroid: Continue current dose of 125 g  Synthroid. Will defer to primary care for adjustment of medications.  8. Pain: Patient was given a refill of her Percocet today. 9. Anxiety: Patient was given a refill of Xanax today.   Patient expressed understanding and was in agreement with this plan. She also understands that She can call clinic at any time with any questions, concerns, or complaints.    Lloyd Huger, MD   05/25/2017 3:31 PM

## 2017-05-21 ENCOUNTER — Inpatient Hospital Stay (HOSPITAL_BASED_OUTPATIENT_CLINIC_OR_DEPARTMENT_OTHER): Payer: Managed Care, Other (non HMO) | Admitting: Oncology

## 2017-05-21 VITALS — BP 122/79 | HR 111 | Temp 98.2°F | Resp 16 | Wt 137.2 lb

## 2017-05-21 DIAGNOSIS — I2699 Other pulmonary embolism without acute cor pulmonale: Secondary | ICD-10-CM

## 2017-05-21 DIAGNOSIS — R5383 Other fatigue: Secondary | ICD-10-CM

## 2017-05-21 DIAGNOSIS — Z87891 Personal history of nicotine dependence: Secondary | ICD-10-CM

## 2017-05-21 DIAGNOSIS — C3432 Malignant neoplasm of lower lobe, left bronchus or lung: Secondary | ICD-10-CM

## 2017-05-21 DIAGNOSIS — Z923 Personal history of irradiation: Secondary | ICD-10-CM

## 2017-05-21 DIAGNOSIS — R531 Weakness: Secondary | ICD-10-CM

## 2017-05-21 DIAGNOSIS — R918 Other nonspecific abnormal finding of lung field: Secondary | ICD-10-CM

## 2017-05-21 DIAGNOSIS — G629 Polyneuropathy, unspecified: Secondary | ICD-10-CM | POA: Diagnosis not present

## 2017-05-21 DIAGNOSIS — R0602 Shortness of breath: Secondary | ICD-10-CM | POA: Diagnosis not present

## 2017-05-21 DIAGNOSIS — Z9221 Personal history of antineoplastic chemotherapy: Secondary | ICD-10-CM

## 2017-05-21 DIAGNOSIS — M545 Low back pain: Secondary | ICD-10-CM

## 2017-05-21 DIAGNOSIS — Z86711 Personal history of pulmonary embolism: Secondary | ICD-10-CM

## 2017-05-21 DIAGNOSIS — D649 Anemia, unspecified: Secondary | ICD-10-CM | POA: Diagnosis not present

## 2017-05-21 DIAGNOSIS — F419 Anxiety disorder, unspecified: Secondary | ICD-10-CM

## 2017-05-21 DIAGNOSIS — F329 Major depressive disorder, single episode, unspecified: Secondary | ICD-10-CM

## 2017-05-21 DIAGNOSIS — K219 Gastro-esophageal reflux disease without esophagitis: Secondary | ICD-10-CM

## 2017-05-21 DIAGNOSIS — E785 Hyperlipidemia, unspecified: Secondary | ICD-10-CM

## 2017-05-21 DIAGNOSIS — G4700 Insomnia, unspecified: Secondary | ICD-10-CM

## 2017-05-21 DIAGNOSIS — Z79899 Other long term (current) drug therapy: Secondary | ICD-10-CM

## 2017-05-21 DIAGNOSIS — M8580 Other specified disorders of bone density and structure, unspecified site: Secondary | ICD-10-CM

## 2017-05-21 DIAGNOSIS — E039 Hypothyroidism, unspecified: Secondary | ICD-10-CM

## 2017-05-21 DIAGNOSIS — J449 Chronic obstructive pulmonary disease, unspecified: Secondary | ICD-10-CM | POA: Diagnosis not present

## 2017-05-21 DIAGNOSIS — E876 Hypokalemia: Secondary | ICD-10-CM | POA: Diagnosis not present

## 2017-05-21 DIAGNOSIS — E538 Deficiency of other specified B group vitamins: Secondary | ICD-10-CM

## 2017-05-21 MED ORDER — PROMETHAZINE HCL 25 MG PO TABS: 25.0000 mg | ORAL_TABLET | Freq: Four times a day (QID) | ORAL | 2 refills | Status: DC | PRN

## 2017-05-21 MED ORDER — OXYCODONE-ACETAMINOPHEN 5-325 MG PO TABS
1.0000 | ORAL_TABLET | ORAL | 0 refills | Status: DC | PRN
Start: 2017-05-21 — End: 2017-06-09

## 2017-05-21 MED ORDER — ALPRAZOLAM 0.25 MG PO TABS: 0.2500 mg | ORAL_TABLET | Freq: Two times a day (BID) | ORAL | 1 refills | Status: DC | PRN

## 2017-05-21 NOTE — Progress Notes (Signed)
Patient here today for follow up.  Patient states no new concerns today  

## 2017-05-22 ENCOUNTER — Other Ambulatory Visit: Payer: Self-pay | Admitting: Unknown Physician Specialty

## 2017-05-22 NOTE — Telephone Encounter (Signed)
Routing to provider. Next appt on 10/14/17.

## 2017-05-25 ENCOUNTER — Other Ambulatory Visit: Payer: Self-pay

## 2017-05-25 DIAGNOSIS — C3432 Malignant neoplasm of lower lobe, left bronchus or lung: Secondary | ICD-10-CM

## 2017-06-08 NOTE — Progress Notes (Signed)
* [facility]     Assessment and Plan:  Small cell lung cancer, initially diagnosed in the right lung, status post chemotherapy and radiation, now with possible recurrence in the left lung. -Possibility of left lung mass/lymphadenopathy adjacent to cut off area of left bronchus intermedius. Uncertain if this represents malignancy versus atelectatic lung. -Discussed with patient will schedule EBUS bronchoscopy.  Complete left lung atelectasis, likely due to external compression. -Her respiratory status appears to be compensated, it appears that her pulmonary physiology has adapted to her left lung atelectasis, therefore, would not recommend any interventional procedure for this at this time.  Left pleural effusion and leftward the style deviation secondary to above. -Status post left thoracentesis, the patient had pain on that side, with a quick reaccumulation likely. Would not perform therapeutic thoracentesis as it would likely recur quickly. Diagnostic thoracentesis could be performed again if needed.  Date: 06/08/2017  MRN# [id number] Jill Gross October 21, 1956   Jill Gross is a 61 y.o. old female seen in follow up for chief complaint of  Chief Complaint  Patient presents with  . Lung Mass    former [facility] patient pt on 2L 02 continuous; She feels as if she is not getting enough oxygen.  . Cough    milky discharge yesterday dark color     HPI:   The patient is a 61 year old female with a history of left lower lobe small cell lung cancer diagnosed in January 2017, treated with radiation and chemotherapy. She she had seen Dr. Stevenson Clinch in October 2017 and undergoing repeat bronchoscopy for left lower lobe atelectasis, this was negative for any recurrence.  Images personally reviewed: CT chest 05/13/17. There is a complete atelectasis of the left lung, the left bronchus intermedius appears to end blindly, likely due to external compression. There appears to be a lung  mass, distally and laterally, there is a moderate pleural effusion, with leftward mediastinal shift.  She had a left thoracentesis on 03/25/17; thoracentesis negative for malignancy.   IMPRESSION: 1. Endobronchial lesion within the distal LEFT main bronchus with complete collapse of the LEFT upper lobe and LEFT lower lobe is similar to comparison exam. 2. Moderate LEFT effusion is also unchanged. 3. No evidence of malignancy in the RIGHT hemithorax. 4. No mediastinal adenopathy.  Medication:    Current Outpatient Prescriptions:  .  ALPRAZolam (XANAX) 0.25 MG tablet, Take 1 tablet (0.25 mg total) by mouth 2 (two) times daily as needed for anxiety., Disp: 60 tablet, Rfl: 1 .  amitriptyline (ELAVIL) 10 MG tablet, TAKE TWO TABLETS AT BEDTIME., Disp: 60 tablet, Rfl: 12 .  ELIQUIS 5 MG TABS tablet, TAKE ONE TABLET BY MOUTH TWICE DAILY., Disp: 60 tablet, Rfl: 6 .  guaiFENesin-codeine 100-10 MG/5ML syrup, Take 10 mLs by mouth 3 (three) times daily as needed for cough. (Patient not taking: Reported on 05/21/2017), Disp: 120 mL, Rfl: 0 .  ipratropium-albuterol (DUONEB) 0.5-2.5 (3) MG/3ML SOLN, Take 3 mLs by nebulization every 6 (six) hours as needed. (Patient not taking: Reported on 05/21/2017), Disp: 360 mL, Rfl: 0 .  levothyroxine (SYNTHROID, LEVOTHROID) 125 MCG tablet, Take 1 tablet (125 mcg total) by mouth daily., Disp: 90 tablet, Rfl: 0 .  oxyCODONE-acetaminophen (PERCOCET/ROXICET) 5-325 MG tablet, Take 1 tablet by mouth every 4 (four) hours as needed for severe pain., Disp: 30 tablet, Rfl: 0 .  promethazine (PHENERGAN) 25 MG tablet, Take 1 tablet (25 mg total) by mouth every 6 (six) hours as needed for nausea or vomiting.,  Disp: 30 tablet, Rfl: 2 .  ranitidine (ZANTAC) 150 MG tablet, Take 150 mg by mouth 2 (two) times daily as needed for heartburn., Disp: , Rfl:  .  simvastatin (ZOCOR) 10 MG tablet, TAKE (1) TABLET BY MOUTH EVERY DAY AT 6PM, Disp: 30 tablet, Rfl: 12 .  zolpidem (AMBIEN) 10 MG  tablet, TAKE (1) TABLET BY MOUTH DAILY AT BEDTIME, Disp: 30 tablet, Rfl: 2  Current Facility-Administered Medications:  .  cyanocobalamin ((VITAMIN B-12)) injection 1,000 mcg, 1,000 mcg, Intramuscular, Q30 days, [name], NP, 1,000 mcg at 04/10/17 1601   Allergies:  Macrobid [nitrofurantoin]; Effexor [venlafaxine]; Hctz [hydrochlorothiazide]; Neurontin [gabapentin]; Paxil [paroxetine hcl]; Pravachol [pravastatin sodium]; Prozac [fluoxetine hcl]; Sulfur; Wellbutrin [bupropion]; and Zoloft [sertraline hcl]  Review of Systems: Gen:  Denies  fever, sweats. HEENT: Denies blurred vision. Cvc:  No dizziness, chest pain or heaviness Resp:   Denies cough or sputum porduction. Gi: Denies swallowing difficulty, stomach pain. constipation, bowel incontinence Gu:  Denies bladder incontinence, burning urine Ext:   No Joint pain, stiffness. Skin: No skin rash, easy bruising. Endoc:  No polyuria, polydipsia. Psych: No depression, insomnia. Other:  All other systems were reviewed and found to be negative other than what is mentioned in the HPI.   Physical Examination:   VS: BP 118/66 (BP Location: Left Arm, Cuff Size: Normal)   Pulse (!) 112   Resp 16   Ht 5\' 3"  (1.6 m)   Wt 137 lb (62.1 kg)   LMP  (LMP Unknown)   SpO2 97%   BMI 24.27 kg/m    General Appearance: No distress  Neuro:without focal findings,  speech normal,  HEENT: PERRLA, EOM intact. Pulmonary: normal breath sounds, decreased air enty left lung.  CardiovascularNormal S1,S2.  No m/r/g.   Abdomen: Benign, Soft, non-tender. Renal:  No costovertebral tenderness  GU:  Not performed at this time. Endoc: No evident thyromegaly, no signs of acromegaly. Skin:   warm, no rash. Extremities: normal, no cyanosis, clubbing.   LABORATORY PANEL:   CBC No results for input(s): WBC, HGB, HCT, PLT in the last 168  hours. ------------------------------------------------------------------------------------------------------------------  Chemistries  No results for input(s): NA, K, CL, CO2, GLUCOSE, BUN, CREATININE, CALCIUM, MG, AST, ALT, ALKPHOS, BILITOT in the last 168 hours.  Invalid input(s): GFRCGP ------------------------------------------------------------------------------------------------------------------  Cardiac Enzymes No results for input(s): TROPONINI in the last 168 hours. ------------------------------------------------------------  RADIOLOGY:    Results for orders placed during the hospital encounter of 11/25/16  DG Chest 2 View   Narrative CLINICAL DATA:  Shortness of breath.  EXAM: CHEST  2 VIEW  COMPARISON:  Radiographs of October 31, 2016.  FINDINGS: Stable cardiomediastinal silhouette. Atherosclerosis of thoracic aorta is noted. Right internal jugular Port-A-Cath is stable in position. Right lung is clear but hyperexpanded. No pneumothorax is noted. Stable left perihilar and basilar opacities are noted which may represent edema, pneumonia or scarring.  IMPRESSION: Stable left lung opacities as described above. Aortic atherosclerosis.   Electronically Signed   By: Marijo Conception, M.D.   On: 11/25/2016 10:24    ------------------------------------------------------------------------------------------------------------------  Thank  you for allowing [facility] to assist in the care of your patient. Our recommendations are noted above.  Please contact us if we can be of further service.   Marda Stalker, MD.  [facility] Office Number: [phone]  Patricia Pesa, M.D.  Merton Border, M.D  06/08/2017

## 2017-06-09 ENCOUNTER — Ambulatory Visit (INDEPENDENT_AMBULATORY_CARE_PROVIDER_SITE_OTHER): Payer: Managed Care, Other (non HMO) | Admitting: Internal Medicine

## 2017-06-09 ENCOUNTER — Encounter: Payer: Self-pay | Admitting: Internal Medicine

## 2017-06-09 ENCOUNTER — Other Ambulatory Visit: Payer: Self-pay | Admitting: *Deleted

## 2017-06-09 VITALS — BP 118/66 | HR 112 | Resp 16 | Ht 63.0 in | Wt 137.0 lb

## 2017-06-09 DIAGNOSIS — R918 Other nonspecific abnormal finding of lung field: Secondary | ICD-10-CM

## 2017-06-09 MED ORDER — OXYCODONE-ACETAMINOPHEN 5-325 MG PO TABS: 1.0000 | ORAL_TABLET | ORAL | 0 refills | Status: DC | PRN

## 2017-06-09 NOTE — Patient Instructions (Addendum)
Will schedule EBUS bronchoscopy.

## 2017-06-12 ENCOUNTER — Other Ambulatory Visit: Payer: Self-pay | Admitting: Unknown Physician Specialty

## 2017-06-22 ENCOUNTER — Encounter
Admission: RE | Admit: 2017-06-22 | Discharge: 2017-06-22 | Disposition: A | Discharge status: Home / Self Care | Payer: Managed Care, Other (non HMO) | Source: Ambulatory Visit | Attending: Internal Medicine | Admitting: Internal Medicine

## 2017-06-22 ENCOUNTER — Telehealth: Payer: Self-pay | Admitting: *Deleted

## 2017-06-22 HISTORY — DX: Pneumonia, unspecified organism: J18.9

## 2017-06-22 HISTORY — DX: Family history of other specified conditions: Z84.89

## 2017-06-22 NOTE — Telephone Encounter (Signed)
Pt advised. Nothing further needed.   

## 2017-06-22 NOTE — Telephone Encounter (Signed)
Patient scheduled for EBUS on 7/26. Does she need to d/c ASA 81 mg?

## 2017-06-22 NOTE — Patient Instructions (Signed)
  Your procedure is scheduled on: 07-02-17  Report to Same Day Surgery 2nd floor medical mall [facility] Entrance-take elevator on left to 2nd floor.  Check in with surgery information desk.) To find out your arrival time please call [phone] between 1PM - 3PM on 07-01-17  Remember: Instructions that are not followed completely may result in serious medical risk, up to and including death, or upon the discretion of your surgeon and anesthesiologist your surgery may need to be rescheduled.    _x___ 1. Do not eat food or drink liquids after midnight. No gum chewing or hard candies.     __x__ 2. No Alcohol for 24 hours before or after surgery.   __x__3. No Smoking for 24 prior to surgery.   ____  4. Bring all medications with you on the day of surgery if instructed.    __x__ 5. Notify your doctor if there is any change in your medical condition     (cold, fever, infections).     Do not wear jewelry, make-up, hairpins, clips or nail polish.  Do not wear lotions, powders, or perfumes. You may wear deodorant.  Do not shave 48 hours prior to surgery. Men may shave face and neck.  Do not bring valuables to the hospital.    [facility] is not responsible for any belongings or valuables.               Contacts, dentures or bridgework may not be worn into surgery.  Leave your suitcase in the car. After surgery it may be brought to your room.  For patients admitted to the hospital, discharge time is determined by your treatment team.   Patients discharged the day of surgery will not be allowed to drive home.  You will need someone to drive you home and stay with you the night of your procedure.    Please read over the following fact sheets that you were given:     _x___ Take anti-hypertensive (unless it includes a diuretic), cardiac, seizure, asthma,     anti-reflux and psychiatric medicines. These include:  1. LEVOTHYROXINE  2. RANITIDINE  3.  4.  5.  6.  ____Fleets enema or  Magnesium Citrate as directed.   ____ Use CHG Soap or sage wipes as directed on instruction sheet   _X___ Use inhalers on the day of surgery and bring to hospital day of surgery-USE NEBULIZER THE MORNING OF SURGERY  ____ Stop Metformin and Janumet 2 days prior to surgery.    ____ Take 1/2 of usual insulin dose the night before surgery and none on the morning surgery.   _x___ Follow recommendations from Cardiologist, Pulmonologist or PCP regarding  stopping Aspirin, Coumadin, Pllavix ,Eliquis, Effient, or Pradaxa, and Pletal-ASK DR RAMACHANDRANS OFFICE ABOUT ASPIRIN-PT JUST HAD RECENT PE DEC 2017  X____Stop Anti-inflammatories such as Advil, Aleve, Ibuprofen, Motrin, Naproxen, Naprosyn, Goodies powders or aspirin products 7 DAYS PRIOR TO SURGERY-OK to take Tylenol    ____ Stop supplements until after surgery.   ____ Bring C-Pap to the hospital.

## 2017-06-22 NOTE — Telephone Encounter (Signed)
One week prior to procedure

## 2017-07-01 ENCOUNTER — Other Ambulatory Visit: Payer: Self-pay | Admitting: *Deleted

## 2017-07-01 ENCOUNTER — Telehealth: Payer: Self-pay | Admitting: Oncology

## 2017-07-01 MED ORDER — OXYCODONE-ACETAMINOPHEN 5-325 MG PO TABS: 1.0000 | ORAL_TABLET | ORAL | 0 refills | Status: DC | PRN

## 2017-07-01 NOTE — Telephone Encounter (Signed)
Patient's husband called to cancel Flush appointment and stated that he does not wish to reschedule at this time. Patient's husband stated that he will call back to reschedule next week.

## 2017-07-02 ENCOUNTER — Inpatient Hospital Stay: Payer: Managed Care, Other (non HMO)

## 2017-07-02 ENCOUNTER — Ambulatory Visit: Payer: Managed Care, Other (non HMO)

## 2017-07-02 ENCOUNTER — Ambulatory Visit: Payer: Managed Care, Other (non HMO) | Admitting: Anesthesiology

## 2017-07-02 ENCOUNTER — Encounter: Payer: Self-pay | Admitting: *Deleted

## 2017-07-02 ENCOUNTER — Other Ambulatory Visit: Payer: Self-pay

## 2017-07-02 ENCOUNTER — Encounter
Admission: RE | Disposition: A | Discharge status: Home / Self Care | Payer: Self-pay | Source: Ambulatory Visit | Attending: Internal Medicine

## 2017-07-02 ENCOUNTER — Ambulatory Visit
Admission: RE | Admit: 2017-07-02 | Discharge: 2017-07-02 | Disposition: A | Discharge status: Home / Self Care | Payer: Managed Care, Other (non HMO) | Source: Ambulatory Visit | Attending: Internal Medicine | Admitting: Internal Medicine

## 2017-07-02 DIAGNOSIS — E039 Hypothyroidism, unspecified: Secondary | ICD-10-CM | POA: Insufficient documentation

## 2017-07-02 DIAGNOSIS — J90 Pleural effusion, not elsewhere classified: Secondary | ICD-10-CM | POA: Insufficient documentation

## 2017-07-02 DIAGNOSIS — J9809 Other diseases of bronchus, not elsewhere classified: Secondary | ICD-10-CM | POA: Insufficient documentation

## 2017-07-02 DIAGNOSIS — R918 Other nonspecific abnormal finding of lung field: Secondary | ICD-10-CM | POA: Diagnosis not present

## 2017-07-02 DIAGNOSIS — Z79899 Other long term (current) drug therapy: Secondary | ICD-10-CM | POA: Diagnosis not present

## 2017-07-02 DIAGNOSIS — F419 Anxiety disorder, unspecified: Secondary | ICD-10-CM | POA: Diagnosis not present

## 2017-07-02 DIAGNOSIS — J9819 Other pulmonary collapse: Secondary | ICD-10-CM | POA: Diagnosis present

## 2017-07-02 DIAGNOSIS — Z923 Personal history of irradiation: Secondary | ICD-10-CM | POA: Diagnosis not present

## 2017-07-02 DIAGNOSIS — Z881 Allergy status to other antibiotic agents status: Secondary | ICD-10-CM | POA: Insufficient documentation

## 2017-07-02 DIAGNOSIS — R000 Tachycardia, unspecified: Secondary | ICD-10-CM | POA: Diagnosis not present

## 2017-07-02 DIAGNOSIS — R05 Cough: Secondary | ICD-10-CM | POA: Diagnosis not present

## 2017-07-02 DIAGNOSIS — J449 Chronic obstructive pulmonary disease, unspecified: Secondary | ICD-10-CM | POA: Insufficient documentation

## 2017-07-02 DIAGNOSIS — K219 Gastro-esophageal reflux disease without esophagitis: Secondary | ICD-10-CM | POA: Insufficient documentation

## 2017-07-02 DIAGNOSIS — Z87891 Personal history of nicotine dependence: Secondary | ICD-10-CM | POA: Insufficient documentation

## 2017-07-02 DIAGNOSIS — Z9221 Personal history of antineoplastic chemotherapy: Secondary | ICD-10-CM | POA: Insufficient documentation

## 2017-07-02 DIAGNOSIS — F329 Major depressive disorder, single episode, unspecified: Secondary | ICD-10-CM | POA: Insufficient documentation

## 2017-07-02 DIAGNOSIS — Z9889 Other specified postprocedural states: Secondary | ICD-10-CM

## 2017-07-02 DIAGNOSIS — Z888 Allergy status to other drugs, medicaments and biological substances status: Secondary | ICD-10-CM | POA: Diagnosis not present

## 2017-07-02 HISTORY — PX: ENDOBRONCHIAL ULTRASOUND: SHX5096

## 2017-07-02 SURGERY — ENDOBRONCHIAL ULTRASOUND (EBUS)
Anesthesia: General

## 2017-07-02 MED ORDER — ONDANSETRON HCL 4 MG/2ML IJ SOLN
INTRAMUSCULAR | Status: DC | PRN
  Administered 2017-07-02: 4 mg via INTRAVENOUS

## 2017-07-02 MED ORDER — ROCURONIUM BROMIDE 50 MG/5ML IV SOLN
INTRAVENOUS | Status: AC
  Filled 2017-07-02: qty 1

## 2017-07-02 MED ORDER — SUCCINYLCHOLINE CHLORIDE 20 MG/ML IJ SOLN
INTRAMUSCULAR | Status: AC
  Filled 2017-07-02: qty 1

## 2017-07-02 MED ORDER — LACTATED RINGERS IV SOLN
INTRAVENOUS | Status: DC
  Administered 2017-07-02: 13:00:00 via INTRAVENOUS

## 2017-07-02 MED ORDER — MIDAZOLAM HCL 2 MG/2ML IJ SOLN
INTRAMUSCULAR | Status: AC
  Filled 2017-07-02: qty 2

## 2017-07-02 MED ORDER — FENTANYL CITRATE (PF) 100 MCG/2ML IJ SOLN: 25.0000 ug | INTRAMUSCULAR | Status: DC | PRN

## 2017-07-02 MED ORDER — ONDANSETRON HCL 4 MG/2ML IJ SOLN
INTRAMUSCULAR | Status: AC
  Filled 2017-07-02: qty 2

## 2017-07-02 MED ORDER — PHENYLEPHRINE HCL 0.25 % NA SOLN
1.0000 | Freq: Four times a day (QID) | NASAL | Status: DC | PRN
  Filled 2017-07-02: qty 15

## 2017-07-02 MED ORDER — SUGAMMADEX SODIUM 500 MG/5ML IV SOLN
INTRAVENOUS | Status: AC
  Filled 2017-07-02: qty 5

## 2017-07-02 MED ORDER — PHENYLEPHRINE HCL 10 MG/ML IJ SOLN
INTRAMUSCULAR | Status: AC
  Filled 2017-07-02: qty 1

## 2017-07-02 MED ORDER — MIDAZOLAM HCL 5 MG/5ML IJ SOLN
INTRAMUSCULAR | Status: DC | PRN
  Administered 2017-07-02: 2 mg via INTRAVENOUS

## 2017-07-02 MED ORDER — LIDOCAINE HCL 2 % EX GEL: 1.0000 "application " | Freq: Once | CUTANEOUS | Status: DC

## 2017-07-02 MED ORDER — ONDANSETRON HCL 4 MG/2ML IJ SOLN
4.0000 mg | Freq: Once | INTRAMUSCULAR | Status: AC | PRN
  Administered 2017-07-02: 4 mg via INTRAVENOUS

## 2017-07-02 MED ORDER — DEXAMETHASONE SODIUM PHOSPHATE 10 MG/ML IJ SOLN
INTRAMUSCULAR | Status: AC
  Filled 2017-07-02: qty 1

## 2017-07-02 MED ORDER — SUGAMMADEX SODIUM 200 MG/2ML IV SOLN
INTRAVENOUS | Status: DC | PRN
  Administered 2017-07-02: 130 mg via INTRAVENOUS

## 2017-07-02 MED ORDER — PHENYLEPHRINE HCL 10 MG/ML IJ SOLN
INTRAMUSCULAR | Status: DC | PRN
  Administered 2017-07-02 (×2): 100 ug via INTRAVENOUS

## 2017-07-02 MED ORDER — FENTANYL CITRATE (PF) 100 MCG/2ML IJ SOLN
INTRAMUSCULAR | Status: AC
  Filled 2017-07-02: qty 2

## 2017-07-02 MED ORDER — SUCCINYLCHOLINE CHLORIDE 20 MG/ML IJ SOLN
INTRAMUSCULAR | Status: DC | PRN
  Administered 2017-07-02: 100 mg via INTRAVENOUS

## 2017-07-02 MED ORDER — DEXAMETHASONE SODIUM PHOSPHATE 10 MG/ML IJ SOLN
INTRAMUSCULAR | Status: DC | PRN
  Administered 2017-07-02: 5 mg via INTRAVENOUS

## 2017-07-02 MED ORDER — BUTAMBEN-TETRACAINE-BENZOCAINE 2-2-14 % EX AERO
1.0000 | INHALATION_SPRAY | Freq: Once | CUTANEOUS | Status: DC
  Filled 2017-07-02: qty 20

## 2017-07-02 MED ORDER — FENTANYL CITRATE (PF) 100 MCG/2ML IJ SOLN
INTRAMUSCULAR | Status: DC | PRN
  Administered 2017-07-02 (×2): 50 ug via INTRAVENOUS

## 2017-07-02 MED ORDER — SEVOFLURANE IN SOLN
RESPIRATORY_TRACT | Status: AC
  Filled 2017-07-02: qty 250

## 2017-07-02 MED ORDER — ONDANSETRON HCL 4 MG/2ML IJ SOLN
INTRAMUSCULAR | Status: DC
Start: 2017-07-02 — End: 2017-07-02
  Filled 2017-07-02: qty 2

## 2017-07-02 MED ORDER — PROPOFOL 10 MG/ML IV BOLUS
INTRAVENOUS | Status: DC | PRN
  Administered 2017-07-02: 30 mg via INTRAVENOUS
  Administered 2017-07-02: 150 mg via INTRAVENOUS

## 2017-07-02 MED ORDER — PROPOFOL 10 MG/ML IV BOLUS
INTRAVENOUS | Status: AC
  Filled 2017-07-02: qty 20

## 2017-07-02 MED ORDER — LIDOCAINE HCL (CARDIAC) 20 MG/ML IV SOLN
INTRAVENOUS | Status: DC | PRN
  Administered 2017-07-02: 60 mg via INTRAVENOUS

## 2017-07-02 MED ORDER — IPRATROPIUM-ALBUTEROL 0.5-2.5 (3) MG/3ML IN SOLN: 3.0000 mL | Freq: Once | RESPIRATORY_TRACT | Status: DC

## 2017-07-02 MED ORDER — ROCURONIUM BROMIDE 100 MG/10ML IV SOLN
INTRAVENOUS | Status: DC | PRN
  Administered 2017-07-02: 5 mg via INTRAVENOUS
  Administered 2017-07-02: 10 mg via INTRAVENOUS

## 2017-07-02 NOTE — H&P (View-Only) (Signed)
* [facility]     Assessment and Plan:  Small cell lung cancer, initially diagnosed in the right lung, status post chemotherapy and radiation, now with possible recurrence in the left lung. -Possibility of left lung mass/lymphadenopathy adjacent to cut off area of left bronchus intermedius. Uncertain if this represents malignancy versus atelectatic lung. -Discussed with patient will schedule EBUS bronchoscopy.  Complete left lung atelectasis, likely due to external compression. -Her respiratory status appears to be compensated, it appears that her pulmonary physiology has adapted to her left lung atelectasis, therefore, would not recommend any interventional procedure for this at this time.  Left pleural effusion and leftward the style deviation secondary to above. -Status post left thoracentesis, the patient had pain on that side, with a quick reaccumulation likely. Would not perform therapeutic thoracentesis as it would likely recur quickly. Diagnostic thoracentesis could be performed again if needed.  Date: 06/08/2017  MRN# [id number] Jill Gross 16-Nov-1956   Jill Gross is a 61 y.o. old female seen in follow up for chief complaint of  Chief Complaint  Patient presents with  . Lung Mass    former [facility] patient pt on 2L 02 continuous; She feels as if she is not getting enough oxygen.  . Cough    milky discharge yesterday dark color     HPI:   The patient is a 61 year old female with a history of left lower lobe small cell lung cancer diagnosed in January 2017, treated with radiation and chemotherapy. She she had seen Dr. Stevenson Clinch in October 2017 and undergoing repeat bronchoscopy for left lower lobe atelectasis, this was negative for any recurrence.  Images personally reviewed: CT chest 05/13/17. There is a complete atelectasis of the left lung, the left bronchus intermedius appears to end blindly, likely due to external compression. There appears to be a lung  mass, distally and laterally, there is a moderate pleural effusion, with leftward mediastinal shift.  She had a left thoracentesis on 03/25/17; thoracentesis negative for malignancy.   IMPRESSION: 1. Endobronchial lesion within the distal LEFT main bronchus with complete collapse of the LEFT upper lobe and LEFT lower lobe is similar to comparison exam. 2. Moderate LEFT effusion is also unchanged. 3. No evidence of malignancy in the RIGHT hemithorax. 4. No mediastinal adenopathy.  Medication:    Current Outpatient Prescriptions:  .  ALPRAZolam (XANAX) 0.25 MG tablet, Take 1 tablet (0.25 mg total) by mouth 2 (two) times daily as needed for anxiety., Disp: 60 tablet, Rfl: 1 .  amitriptyline (ELAVIL) 10 MG tablet, TAKE TWO TABLETS AT BEDTIME., Disp: 60 tablet, Rfl: 12 .  ELIQUIS 5 MG TABS tablet, TAKE ONE TABLET BY MOUTH TWICE DAILY., Disp: 60 tablet, Rfl: 6 .  guaiFENesin-codeine 100-10 MG/5ML syrup, Take 10 mLs by mouth 3 (three) times daily as needed for cough. (Patient not taking: Reported on 05/21/2017), Disp: 120 mL, Rfl: 0 .  ipratropium-albuterol (DUONEB) 0.5-2.5 (3) MG/3ML SOLN, Take 3 mLs by nebulization every 6 (six) hours as needed. (Patient not taking: Reported on 05/21/2017), Disp: 360 mL, Rfl: 0 .  levothyroxine (SYNTHROID, LEVOTHROID) 125 MCG tablet, Take 1 tablet (125 mcg total) by mouth daily., Disp: 90 tablet, Rfl: 0 .  oxyCODONE-acetaminophen (PERCOCET/ROXICET) 5-325 MG tablet, Take 1 tablet by mouth every 4 (four) hours as needed for severe pain., Disp: 30 tablet, Rfl: 0 .  promethazine (PHENERGAN) 25 MG tablet, Take 1 tablet (25 mg total) by mouth every 6 (six) hours as needed for nausea or vomiting.,  Disp: 30 tablet, Rfl: 2 .  ranitidine (ZANTAC) 150 MG tablet, Take 150 mg by mouth 2 (two) times daily as needed for heartburn., Disp: , Rfl:  .  simvastatin (ZOCOR) 10 MG tablet, TAKE (1) TABLET BY MOUTH EVERY DAY AT 6PM, Disp: 30 tablet, Rfl: 12 .  zolpidem (AMBIEN) 10 MG  tablet, TAKE (1) TABLET BY MOUTH DAILY AT BEDTIME, Disp: 30 tablet, Rfl: 2  Current Facility-Administered Medications:  .  cyanocobalamin ((VITAMIN B-12)) injection 1,000 mcg, 1,000 mcg, Intramuscular, Q30 days, [name], NP, 1,000 mcg at 04/10/17 1601   Allergies:  Macrobid [nitrofurantoin]; Effexor [venlafaxine]; Hctz [hydrochlorothiazide]; Neurontin [gabapentin]; Paxil [paroxetine hcl]; Pravachol [pravastatin sodium]; Prozac [fluoxetine hcl]; Sulfur; Wellbutrin [bupropion]; and Zoloft [sertraline hcl]  Review of Systems: Gen:  Denies  fever, sweats. HEENT: Denies blurred vision. Cvc:  No dizziness, chest pain or heaviness Resp:   Denies cough or sputum porduction. Gi: Denies swallowing difficulty, stomach pain. constipation, bowel incontinence Gu:  Denies bladder incontinence, burning urine Ext:   No Joint pain, stiffness. Skin: No skin rash, easy bruising. Endoc:  No polyuria, polydipsia. Psych: No depression, insomnia. Other:  All other systems were reviewed and found to be negative other than what is mentioned in the HPI.   Physical Examination:   VS: BP 118/66 (BP Location: Left Arm, Cuff Size: Normal)   Pulse (!) 112   Resp 16   Ht 5\' 3"  (1.6 m)   Wt 137 lb (62.1 kg)   LMP  (LMP Unknown)   SpO2 97%   BMI 24.27 kg/m    General Appearance: No distress  Neuro:without focal findings,  speech normal,  HEENT: PERRLA, EOM intact. Pulmonary: normal breath sounds, decreased air enty left lung.  CardiovascularNormal S1,S2.  No m/r/g.   Abdomen: Benign, Soft, non-tender. Renal:  No costovertebral tenderness  GU:  Not performed at this time. Endoc: No evident thyromegaly, no signs of acromegaly. Skin:   warm, no rash. Extremities: normal, no cyanosis, clubbing.   LABORATORY PANEL:   CBC No results for input(s): WBC, HGB, HCT, PLT in the last 168  hours. ------------------------------------------------------------------------------------------------------------------  Chemistries  No results for input(s): NA, K, CL, CO2, GLUCOSE, BUN, CREATININE, CALCIUM, MG, AST, ALT, ALKPHOS, BILITOT in the last 168 hours.  Invalid input(s): GFRCGP ------------------------------------------------------------------------------------------------------------------  Cardiac Enzymes No results for input(s): TROPONINI in the last 168 hours. ------------------------------------------------------------  RADIOLOGY:    Results for orders placed during the hospital encounter of 11/25/16  DG Chest 2 View   Narrative CLINICAL DATA:  Shortness of breath.  EXAM: CHEST  2 VIEW  COMPARISON:  Radiographs of October 31, 2016.  FINDINGS: Stable cardiomediastinal silhouette. Atherosclerosis of thoracic aorta is noted. Right internal jugular Port-A-Cath is stable in position. Right lung is clear but hyperexpanded. No pneumothorax is noted. Stable left perihilar and basilar opacities are noted which may represent edema, pneumonia or scarring.  IMPRESSION: Stable left lung opacities as described above. Aortic atherosclerosis.   Electronically Signed   By: Marijo Conception, M.D.   On: 11/25/2016 10:24    ------------------------------------------------------------------------------------------------------------------  Thank  you for allowing [facility] to assist in the care of your patient. Our recommendations are noted above.  Please contact us if we can be of further service.   Marda Stalker, MD.  [facility] Office Number: [phone]  Patricia Pesa, M.D.  Merton Border, M.D  06/08/2017

## 2017-07-02 NOTE — Interval H&P Note (Signed)
History and Physical Interval Note:  07/02/2017 12:24 PM  Jill Gross  has presented today for surgery, with the diagnosis of lung mass  The various methods of treatment have been discussed with the patient and family. After consideration of risks, benefits and other options for treatment, the patient has consented to  Procedure(s): ENDOBRONCHIAL ULTRASOUND (N/A) as a surgical intervention .  The patient's history has been reviewed, patient examined, no change in status, stable for surgery.  I have reviewed the patient's chart and labs.  Questions were answered to the patient's satisfaction.     Laverle Hobby

## 2017-07-02 NOTE — Op Note (Signed)
  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 EBUS guided lymph node mass sampling taken from this area. In addition, transbronchial Wang needle biopsies were taken. -Other specimens taken: Bronchoalveolar lavage, cytology brushing, endobronchial left mainstem sampling at bronchus intermedius at the area of abnormal mucosa. -No endobronchial lesion noted. -Moderate mucosal erythema and copious mucous secretions seen throughout both lungs.  Indication: Left lung collapse with possible lung mass. The patient (or their representative) was informed of the risks (including but not limited to bleeding, infection, respiratory failure, lung injury, tooth/oral injury) and benefits of the procedure and gave consent, see chart.   Pre-op diagnosis: Left lung mass with left lung collapse. Post-op diagnosis: Same Estimated blood loss: Minimal  Medications for procedure: Please see anesthesia notes.  Procedure description: After obtaining informed consent, a timeout was called in from the patient. The procedure. Patient was intubated by anesthesia services. Please see their notes for further anesthetic details. The EBUS bronchoscope was passed via the endotracheal tube to the left mainstem, the patient had copious mucosal secretions seen. The EBUS scope was wedged in the left mainstem at the junction between the left upper lobe and the left lower lobe bronchial openings. EBUS guided lymph node/mass sampling was taken 3 with good returns. Other lymph node stations were checked, there was no significant lymphadenopathy seen in other areas. The EBUS scope was removed, the white light bronchoscope was passed, and anatomical was undertaken, the right lung showed no saline abnormality other than copious secretions which were seen throughout  both lungs. On evaluation of the left lung. The left mainstem bronchus led to a left lower lobe bronchus which was nearly completely occluded from external compression. The scope could not be passed past this area. There was also narrowing of the left upper lobe bronchus due to external compression. No endobronchial lesion was seen. Wang needle was used to biopsy at the secondary carina areas. Cytobrush was then taken in the same areas. Forceps biopsies were then used to take biopsy from the secondary carina near the opening of the left lower lobe bronchus. Neither the forceps nor the brush could be passed her the left lower lobe opening. 2 mL of topical epinephrine were applied to help control bleeding, which was minimal.   Condition post procedure: Stable   Complications: None noted  Patient instructions: Given to husband.   Marda Stalker, MD.  Board Certified in Internal Medicine, Pulmonary Medicine, [place], and Sleep Medicine.  [facility] Office Number: [phone]  Patricia Pesa, M.D.  Vilinda Boehringer, M.D.  Cheral Marker, M.D  07/02/2017

## 2017-07-02 NOTE — Transfer of Care (Signed)
Immediate Anesthesia Transfer of Care Note  Patient: Jill Gross  Procedure(s) Performed: Procedure(s): ENDOBRONCHIAL ULTRASOUND (N/A)  Patient Location: PACU  Anesthesia Type:General  Level of Consciousness: sedated  Airway & Oxygen Therapy: Patient Spontanous Breathing and Patient connected to face mask oxygen  Post-op Assessment: Report given to RN and Post -op Vital signs reviewed and stable  Post vital signs: Reviewed and stable  Last Vitals:  Vitals:   07/02/17 1228 07/02/17 1435  BP: 122/68 (!) 149/60  Pulse: (!) 123 (!) 124  Resp: 18 (!) 37  Temp: 36.7 C 36.4 C    Last Pain:  Vitals:   07/02/17 1435  TempSrc:   PainSc: 0-No pain         Complications: No apparent anesthesia complications

## 2017-07-02 NOTE — Anesthesia Preprocedure Evaluation (Signed)
Anesthesia Evaluation  Patient identified by MRN, date of birth, ID band Patient awake    Reviewed: Allergy & Precautions, NPO status , Patient's Chart, lab work & pertinent test results  Airway Mallampati: II       Dental  (+) Upper Dentures, Lower Dentures   Pulmonary shortness of breath, COPD,  COPD inhaler, former smoker,     + decreased breath sounds      Cardiovascular Exercise Tolerance: Good hypertension,  Rhythm:Regular Rate:Tachycardia     Neuro/Psych Anxiety Depression    GI/Hepatic Neg liver ROS, GERD  Medicated,  Endo/Other  Hypothyroidism   Renal/GU negative Renal ROS     Musculoskeletal   Abdominal Normal abdominal exam  (+)   Peds  Hematology  (+) anemia ,   Anesthesia Other Findings   Reproductive/Obstetrics                             Anesthesia Physical Anesthesia Plan  ASA: III  Anesthesia Plan: General   Post-op Pain Management:  Regional for Post-op pain   Induction:   PONV Risk Score and Plan:   Airway Management Planned: Oral ETT  Additional Equipment:   Intra-op Plan:   Post-operative Plan: Extubation in OR  Informed Consent: I have reviewed the patients History and Physical, chart, labs and discussed the procedure including the risks, benefits and alternatives for the proposed anesthesia with the patient or authorized representative who has indicated his/her understanding and acceptance.     Plan Discussed with: CRNA  Anesthesia Plan Comments:         Anesthesia Quick Evaluation

## 2017-07-02 NOTE — Anesthesia Procedure Notes (Signed)
Procedure Name: Intubation Date/Time: 07/02/2017 1:32 PM Performed by: Dionne Bucy Pre-anesthesia Checklist: Patient identified, Patient being monitored, Timeout performed, Emergency Drugs available and Suction available Patient Re-evaluated:Patient Re-evaluated prior to induction Oxygen Delivery Method: Circle system utilized Preoxygenation: Pre-oxygenation with 100% oxygen Induction Type: IV induction Ventilation: Mask ventilation without difficulty Laryngoscope Size: Mac and 3 Grade View: Grade I Tube type: Oral Tube size: 8.0 mm Number of attempts: 1 Airway Equipment and Method: Stylet Placement Confirmation: ETT inserted through vocal cords under direct vision,  positive ETCO2 and breath sounds checked- equal and bilateral Secured at: 21 cm Tube secured with: Tape Dental Injury: Teeth and Oropharynx as per pre-operative assessment

## 2017-07-02 NOTE — Discharge Instructions (Addendum)
In the first 24 hours after the procedure you may have fevers, chills, left sided chest pain, and may cough up some blood, this is normal in the first 24 hours. You may take tylenol (acetaminophen) for pain, fever or chills. If the fever or the coughing up blood is not improving in 24 hours, please call our office as you may need an antibiotic.  If you develop sudden chest pain or severe breathing difficulty, go to the ER or call 911.      AMBULATORY SURGERY  DISCHARGE INSTRUCTIONS   1) The drugs that you were given will stay in your system until tomorrow so for the next 24 hours you should not:  A) Drive an automobile B) Make any legal decisions C) Drink any alcoholic beverage   2) You may resume regular meals tomorrow.  Today it is better to start with liquids and gradually work up to solid foods.  You may eat anything you prefer, but it is better to start with liquids, then soup and crackers, and gradually work up to solid foods.   3) Please notify your doctor immediately if you have any unusual bleeding, trouble breathing, redness and pain at the surgery site, drainage, fever, or pain not relieved by medication.    4) Additional Instructions:        Please contact your physician with any problems or Same Day Surgery at [phone], Monday through Friday 6 am to 4 pm, or [place] at [facility] number at [phone].

## 2017-07-02 NOTE — Anesthesia Post-op Follow-up Note (Cosign Needed)
Anesthesia QCDR form completed.        

## 2017-07-03 ENCOUNTER — Encounter: Payer: Self-pay | Admitting: Internal Medicine

## 2017-07-03 LAB — ACID FAST SMEAR (AFB)

## 2017-07-03 LAB — ACID FAST SMEAR (AFB, MYCOBACTERIA): Acid Fast Smear: NEGATIVE

## 2017-07-04 LAB — CYTOLOGY - NON PAP

## 2017-07-04 LAB — SURGICAL PATHOLOGY

## 2017-07-05 LAB — CULTURE, BAL-QUANTITATIVE W GRAM STAIN

## 2017-07-05 LAB — CULTURE, BAL-QUANTITATIVE: CULTURE: NO GROWTH

## 2017-07-08 ENCOUNTER — Ambulatory Visit: Payer: Managed Care, Other (non HMO) | Admitting: Radiation Oncology

## 2017-07-09 NOTE — Anesthesia Postprocedure Evaluation (Signed)
Anesthesia Post Note  Patient: Jill Gross  Procedure(s) Performed: Procedure(s) (LRB): ENDOBRONCHIAL ULTRASOUND (N/A)  Patient location during evaluation: PACU Anesthesia Type: General Level of consciousness: awake Pain management: pain level controlled Vital Signs Assessment: post-procedure vital signs reviewed and stable Respiratory status: spontaneous breathing Cardiovascular status: stable Anesthetic complications: no     Last Vitals:  Vitals:   07/02/17 1516 07/02/17 1548  BP: (!) 139/58 (!) 122/46  Pulse: (!) 103 (!) 104  Resp: 18 18  Temp: (!) 35.7 C 36.5 C    Last Pain:  Vitals:   07/03/17 0825  TempSrc:   PainSc: 0-No pain                 VAN STAVEREN,Hatem Cull

## 2017-07-13 ENCOUNTER — Telehealth: Payer: Self-pay | Admitting: Internal Medicine

## 2017-07-13 NOTE — Telephone Encounter (Signed)
Discussed results with the patient which were negative. She was asked to follow up with her oncologist and follow up with is prn for any breathing difficulty.

## 2017-07-13 NOTE — Telephone Encounter (Signed)
Pt spouse calling about biopsy they did in July  Would like results  Please call back

## 2017-07-13 NOTE — Telephone Encounter (Signed)
Pt is requesting results from [place] [facility].

## 2017-07-15 ENCOUNTER — Encounter: Payer: Self-pay | Admitting: Radiation Oncology

## 2017-07-15 ENCOUNTER — Inpatient Hospital Stay: Payer: Managed Care, Other (non HMO) | Attending: Radiation Oncology

## 2017-07-15 ENCOUNTER — Ambulatory Visit
Admission: RE | Admit: 2017-07-15 | Discharge: 2017-07-15 | Disposition: A | Discharge status: Home / Self Care | Payer: Managed Care, Other (non HMO) | Source: Ambulatory Visit | Attending: Radiation Oncology | Admitting: Radiation Oncology

## 2017-07-15 VITALS — Wt 140.0 lb

## 2017-07-15 DIAGNOSIS — Z923 Personal history of irradiation: Secondary | ICD-10-CM | POA: Diagnosis not present

## 2017-07-15 DIAGNOSIS — Z452 Encounter for adjustment and management of vascular access device: Secondary | ICD-10-CM | POA: Insufficient documentation

## 2017-07-15 DIAGNOSIS — C3432 Malignant neoplasm of lower lobe, left bronchus or lung: Secondary | ICD-10-CM | POA: Insufficient documentation

## 2017-07-15 DIAGNOSIS — Z87891 Personal history of nicotine dependence: Secondary | ICD-10-CM | POA: Insufficient documentation

## 2017-07-15 DIAGNOSIS — Z85118 Personal history of other malignant neoplasm of bronchus and lung: Secondary | ICD-10-CM | POA: Diagnosis not present

## 2017-07-15 DIAGNOSIS — Z95828 Presence of other vascular implants and grafts: Secondary | ICD-10-CM

## 2017-07-15 MED ORDER — SODIUM CHLORIDE 0.9% FLUSH
10.0000 mL | INTRAVENOUS | Status: DC | PRN
  Administered 2017-07-15: 10 mL via INTRAVENOUS
  Filled 2017-07-15: qty 10

## 2017-07-15 MED ORDER — HEPARIN SOD (PORK) LOCK FLUSH 100 UNIT/ML IV SOLN
500.0000 [IU] | Freq: Once | INTRAVENOUS | Status: AC
  Administered 2017-07-15: 500 [IU] via INTRAVENOUS

## 2017-07-16 NOTE — Progress Notes (Signed)
Radiation Oncology Follow up Note  Name: Jill Gross   Date:   07/15/2017 MRN:  [id number] DOB: 13-Mar-1956    This 61 y.o. female presents to the clinic today for 1 year follow-up for chest and PCI for limited stage small cell lung cancer.  REFERRING PROVIDER: [name], NP  HPI: patient is a 61 year old female now out close to 15 months having completed both PCI as well as chest radiation with concurrent chemotherapy for limited stage small cell lung cancer. She is seen today in routine follow-up is doing fairly well..she continues to have collapse of the left lower lobe and recently underwent bronchoscopy showing 90% stenosis of the left lower lobe bronchus although all pathology washings and biopsies were negative for malignancy. She had no endobronchial lesion noted. She specifically denies cough hemoptysis or chest tightness.she is fairly wheelchair-bound does continue have some chronic shortness of breath weight is stable with good by mouth intake.  COMPLICATIONS OF TREATMENT: none  FOLLOW UP COMPLIANCE: keeps appointments   PHYSICAL EXAM:  Wt 139 lb 15.9 oz (63.5 kg)   LMP  (LMP Unknown)   BMI 24.80 kg/m  Wheelchair-bound female in NAD.Patient is on nasal oxygen. Well-developed well-nourished patient in NAD. HEENT reveals PERLA, EOMI, discs not visualized.  Oral cavity is clear. No oral mucosal lesions are identified. Neck is clear without evidence of cervical or supraclavicular adenopathy. Lungs are clear to A&P. Cardiac examination is essentially unremarkable with regular rate and rhythm without murmur rub or thrill. Abdomen is benign with no organomegaly or masses noted. Motor sensory and DTR levels are equal and symmetric in the upper and lower extremities. Cranial nerves II through XII are grossly intact. Proprioception is intact. No peripheral adenopathy or edema is identified. No motor or sensory levels are noted. Crude visual fields are within normal range.   RADIOLOGY  RESULTS: recent CT scans reviewed and compatible with the above-stated findings  PLAN: present time patient is doing well she has no evidence of malignancy on repeat bronchoscopy. I'm overall please were overall progress. Not sure the etiology of the stenosis although radiation changes could be responsible. I have asked to see her back in 6 months for follow-up. She continues close follow-up care with medical oncology. Patient is to call with any concerns.  I would like to take this opportunity to thank you for allowing me to participate in the care of your patient.Armstead Peaks., MD

## 2017-08-11 ENCOUNTER — Ambulatory Visit
Admission: RE | Admit: 2017-08-11 | Discharge: 2017-08-11 | Disposition: A | Discharge status: Home / Self Care | Payer: Managed Care, Other (non HMO) | Source: Ambulatory Visit | Attending: Oncology | Admitting: Oncology

## 2017-08-11 DIAGNOSIS — C3432 Malignant neoplasm of lower lobe, left bronchus or lung: Secondary | ICD-10-CM | POA: Diagnosis not present

## 2017-08-11 DIAGNOSIS — J90 Pleural effusion, not elsewhere classified: Secondary | ICD-10-CM | POA: Diagnosis not present

## 2017-08-11 DIAGNOSIS — I700 Atherosclerosis of aorta: Secondary | ICD-10-CM | POA: Diagnosis not present

## 2017-08-11 LAB — POCT I-STAT CREATININE: CREATININE: 0.9 mg/dL (ref 0.44–1.00)

## 2017-08-11 MED ORDER — IOPAMIDOL (ISOVUE-300) INJECTION 61%
75.0000 mL | Freq: Once | INTRAVENOUS | Status: AC | PRN
  Administered 2017-08-11: 75 mL via INTRAVENOUS

## 2017-08-12 NOTE — Progress Notes (Signed)
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  INTERVAL HISTORY: Patient returns to clinic today for further evaluation and discussion of her imaging results. Her performance status remains decreased. She continues to have chronic shortness of breath and requires oxygen. She has a fair appetite, but no further weight loss.  She also has continued neuropathy particularly in her fingertips. She has no other neurologic complaints. She denies any recent fevers. She denies any chest pain or hemoptysis. She denies any nausea, vomiting, constipation, or diarrhea. She has no urinary complaints. Patient offers no further specific complaints today.   REVIEW OF SYSTEMS:   Review of Systems  Constitutional: Positive for malaise/fatigue. Negative for fever and weight loss.  HENT: Negative for sore throat.   Eyes: Negative.   Respiratory: Positive for shortness of breath. Negative for cough and hemoptysis.   Cardiovascular: Negative.  Negative for chest pain and leg swelling.  Gastrointestinal: Negative for nausea and vomiting.  Genitourinary: Negative.   Musculoskeletal: Negative.   Skin: Negative.   Neurological: Positive for sensory change and weakness. Negative for dizziness and tingling.  Psychiatric/Behavioral: Negative.  Negative for depression. The patient is not nervous/anxious.     As per HPI. Otherwise, a complete review of systems is negative.  PAST MEDICAL HISTORY: Past Medical History:  Diagnosis Date  . Allergy   .  Anemia    pernicious  . Anxiety   . Arthritis   . Atrophic vaginitis   . COPD (chronic obstructive pulmonary disease) ([place])   . Depression   . Diverticulosis   . Dyspnea   . Family history of adverse reaction to anesthesia    MOM-BUT UNSURE WHAT REACTION WAS  . Fatigue   . GERD (gastroesophageal reflux disease)   . History of chemotherapy 04/2016  . Hyperlipidemia   . Hypothyroid   . Insomnia   . Low back pain   . Lumbago   . Neuropathy due to chemotherapeutic drug ([place])   . Osteopenia   . Pneumonia 11/2016  . Small cell carcinoma of left lung ([place]) 01/2016   rad + chemo tx's  . Tobacco use   . Vitamin B12 deficiency     PAST SURGICAL HISTORY: Past Surgical History:  Procedure Laterality Date  . ABDOMINAL HYSTERECTOMY    . ANKLE SURGERY     x4  . CARPAL TUNNEL RELEASE  April 2015  . CHOLECYSTECTOMY    . ENDOBRONCHIAL ULTRASOUND N/A 01/07/2016   Procedure: ENDOBRONCHIAL ULTRASOUND;  Surgeon: Vilinda Boehringer, MD;  Location: [facility] ORS;  Service: Cardiopulmonary;  Laterality: N/A;  . ENDOBRONCHIAL ULTRASOUND N/A 09/16/2016   Procedure: ENDOBRONCHIAL ULTRASOUND;  Surgeon: Vilinda Boehringer, MD;  Location: [facility] ORS;  Service: Cardiopulmonary;  Laterality: N/A;  . ENDOBRONCHIAL ULTRASOUND N/A 07/02/2017   Procedure: ENDOBRONCHIAL ULTRASOUND;  Surgeon: Laverle Hobby, MD;  Location: [facility] ORS;  Service: Pulmonary;  Laterality: N/A;  . HERNIA REPAIR    . PORTACATH PLACEMENT Right 01/23/2016   Procedure: INSERTION PORT-A-CATH;  Surgeon: [name], MD;  Location: [facility] ORS;  Service: General;  Laterality: Right;    FAMILY HISTORY Family History  Problem  Relation Age of Onset  . Emphysema Mother   . Diabetes Father        ADVANCED DIRECTIVES:    HEALTH MAINTENANCE: Social History  Substance Use Topics  . Smoking status: Former Smoker    Packs/day: 1.00    Years: 30.00    Types: Cigarettes    Quit date: 01/11/2016  . Smokeless tobacco: Never Used  . Alcohol use No      Allergies  Allergen Reactions  . Macrobid [Nitrofurantoin] Anaphylaxis  . Effexor [Venlafaxine] Other (See Comments)    UNKNOWN  . Hctz [Hydrochlorothiazide] Other (See Comments)    cramps  . Neurontin [Gabapentin] Other (See Comments)    UNKNOWN   . Paxil [Paroxetine Hcl] Other (See Comments)    UNKNOWN   . Pravachol [Pravastatin Sodium] Other (See Comments)    aching  . Prozac [Fluoxetine Hcl] Other (See Comments)    UNKNOWN   . Sulfur Itching and Other (See Comments)    "blisters in mouth"  . Wellbutrin [Bupropion] Itching  . Zoloft [Sertraline Hcl] Other (See Comments)    UNKNOWN     Current Outpatient Prescriptions  Medication Sig Dispense Refill  . ALPRAZolam (XANAX) 0.25 MG tablet Take 1 tablet (0.25 mg total) by mouth 2 (two) times daily as needed for anxiety. 60 tablet 1  . amitriptyline (ELAVIL) 10 MG tablet TAKE TWO TABLETS AT BEDTIME. 60 tablet 12  . aspirin EC 81 MG tablet Take 81 mg by mouth daily.    . Cyanocobalamin (VITAMIN B-12 IJ) Inject 1,000 mcg as directed every 30 (thirty) days.    Marland Kitchen levothyroxine (SYNTHROID, LEVOTHROID) 125 MCG tablet TAKE 1 TABLET ON AN EMPTY STOMACH WITH A GLASS OF WATER AT LEAST 30 TO 60 MINUTES BEFORE BREAKFAST. 90 tablet 1  . oxyCODONE-acetaminophen (PERCOCET/ROXICET) 5-325 MG tablet Take 1 tablet by mouth every 4 (four) hours as needed for severe pain. 30 tablet 0  . promethazine (PHENERGAN) 25 MG tablet Take 1 tablet (25 mg total) by mouth every 6 (six) hours as needed for nausea or vomiting. 30 tablet 2  . ranitidine (ZANTAC) 150 MG tablet Take 150 mg by mouth 2 (two) times daily as needed for heartburn.    . simvastatin (ZOCOR) 10 MG tablet TAKE (1) TABLET BY MOUTH EVERY DAY AT 6PM 30 tablet 12  . zolpidem (AMBIEN) 10 MG tablet TAKE 1 TABLET AT BEDTIME AS NEEDED FOR SLEEP (Patient taking differently: TAKE 1 TABLET BY MOUTH AT BEDTIME) 30 tablet 2  . acetaminophen (TYLENOL) 500 MG tablet Take 1,000 mg by mouth daily as needed  for mild pain.    Marland Kitchen ELIQUIS 5 MG TABS tablet TAKE ONE TABLET BY MOUTH TWICE DAILY. (Patient not taking: Reported on 06/09/2017) 60 tablet 6  . guaiFENesin-codeine 100-10 MG/5ML syrup Take 10 mLs by mouth 3 (three) times daily as needed for cough. (Patient not taking: Reported on 06/22/2017) 120 mL 0  . ipratropium-albuterol (DUONEB) 0.5-2.5 (3) MG/3ML SOLN Take 3 mLs by nebulization every 6 (six) hours as needed. (Patient not taking: Reported on 08/13/2017) 360 mL 0   Current Facility-Administered Medications  Medication Dose Route Frequency Provider Last Rate Last Dose  . cyanocobalamin ((VITAMIN B-12)) injection 1,000 mcg  1,000 mcg Intramuscular Q30 days [name], NP   1,000 mcg at 04/10/17 1601   Facility-Administered Medications Ordered in Other Visits  Medication Dose Route Frequency Provider Last Rate Last Dose  . sodium chloride flush (NS) 0.9 % injection 10 mL  10 mL Intravenous PRN Haylen Shelnutt,  Kathlene November, MD   10 mL at 08/13/17 1508    OBJECTIVE: Vitals:   08/13/17 1550  BP: 113/76  Pulse: (!) 102  Resp: 20  Temp: (!) 96.1 F (35.6 C)     Body mass index is 24.53 kg/m.    ECOG FS:1 - Symptomatic but completely ambulatory  General: No acute distress. Sitting in wheelchair Eyes: Pink conjunctiva, anicteric sclera. HEENT: Oropharynx clear without erythema or exudate. Lungs: Clear to auscultation bilaterally. Heart: Tachycardia. No rubs, murmurs, or gallops. Musculoskeletal: No edema, cyanosis, or clubbing. Neuro: Alert, answering all questions appropriately. Cranial nerves grossly intact. Skin: No rashes or petechiae noted. Poor skin turgor. Psych: Normal affect.   LAB RESULTS:  Lab Results  Component Value Date   NA 133 (L) 08/13/2017   K 3.7 08/13/2017   CL 98 (L) 08/13/2017   CO2 27 08/13/2017   GLUCOSE 115 (H) 08/13/2017   BUN 9 08/13/2017   CREATININE 0.82 08/13/2017   CALCIUM 9.0 08/13/2017   PROT 7.0 08/13/2017   ALBUMIN 3.7 08/13/2017   AST 23 08/13/2017    ALT 15 08/13/2017   ALKPHOS 84 08/13/2017   BILITOT 0.4 08/13/2017   GFRNONAA >60 08/13/2017   GFRAA >60 08/13/2017    Lab Results  Component Value Date   WBC 6.9 08/13/2017   NEUTROABS 3.1 05/13/2017   HGB 10.7 (L) 08/13/2017   HCT 31.1 (L) 08/13/2017   MCV 91.2 08/13/2017   PLT 247 08/13/2017     STUDIES: Ct Chest W Contrast  Result Date: 08/11/2017 CLINICAL DATA:  Followup left lower lobe small cell lung carcinoma. Previous chemotherapy and radiation therapy. Weakness, shortness breath, and hypoxia. EXAM: CT CHEST WITH CONTRAST TECHNIQUE: Multidetector CT imaging of the chest was performed during intravenous contrast administration. CONTRAST:  40mL ISOVUE-300 IOPAMIDOL (ISOVUE-300) INJECTION 61% COMPARISON:  05/13/2017 FINDINGS: Cardiovascular: No acute findings. Aortic and coronary artery atherosclerosis. Mediastinum/Nodes: No masses or pathologically enlarged lymph nodes identified. Lungs/Pleura: Moderate left pleural effusion is stable. Persistent atelectasis of left upper and lower lobes is seen, although there is mildly increased aeration since prior study. Low-attenuation fluid noted in the central left lower lobe bronchus similar previous study, although no definite mass visualized. Right lung remains clear. Upper Abdomen:  Unremarkable. Musculoskeletal:  No suspicious bone lesions. IMPRESSION: Stable moderate left pleural effusion. Persistent low-attenuation fluid in central left lower lobe bronchus. Near complete collapse of left upper and lower lobes noted, with mildly increased aeration since previous study. No new or progressive disease within the thorax. Aortic Atherosclerosis (ICD10-I70.0). Coronary artery calcification. Electronically Signed   By: Earle Gell M.D.   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  PLAN:    1. Clinical stage IIa, T2 N1 M0 small cell lung cancer of the bronchus  of left lower lobe: CT results from July 11, 2017 reviewed independently and reported as above with near collapse of left upper and lower lobes of lung, but no new or progressive disease noted. Pleural effusion is unchanged. Previously, bronchoscopy and FNA results on September 16, 2016 were negative for malignancy. Pleural fluid from thoracentesis on March 25, 2017 was also negative for recurrence. Repeat bronchoscopy on July 02, 2017 revealed a 90% stenosis of her bronchus, but multiple biopsies revealed no evidence of malignancy. No intervention is needed at this time. Return to clinic in 6 months with repeat imaging and further evaluation.  2. Pulmonary  embolism: Likely secondary to increased sedentary lifestyle. Patient has now taken 6 months of Eliquis and has been instructed to discontinue treatment. 3. Poor appetite: Improving, monitor.  4. Hypokalemia:  Resolved.Continue oral supplementation as prescribed. 5. Peripheral neuropathy: Patient is no longer taking gabapentin. 6. Anemia: Patient's hemoglobin remains decreased, but stable. Monitor. 7. Thyroid: Continue current dose of 125 g Synthroid. Will defer to primary care for adjustment of medications.  8. Pain: Patient was given a refill of her Percocet today. 9. Anxiety:  Continue Xanax as needed.   Patient expressed understanding and was in agreement with this plan. She also understands that She can call clinic at any time with any questions, concerns, or complaints.    Lloyd Huger, MD   08/13/2017 4:48 PM

## 2017-08-13 ENCOUNTER — Inpatient Hospital Stay: Payer: Managed Care, Other (non HMO) | Attending: Oncology

## 2017-08-13 ENCOUNTER — Inpatient Hospital Stay (HOSPITAL_BASED_OUTPATIENT_CLINIC_OR_DEPARTMENT_OTHER): Payer: Managed Care, Other (non HMO) | Admitting: Oncology

## 2017-08-13 VITALS — BP 113/76 | HR 102 | Temp 96.1°F | Resp 20 | Wt 138.5 lb

## 2017-08-13 DIAGNOSIS — G629 Polyneuropathy, unspecified: Secondary | ICD-10-CM | POA: Diagnosis not present

## 2017-08-13 DIAGNOSIS — E785 Hyperlipidemia, unspecified: Secondary | ICD-10-CM | POA: Insufficient documentation

## 2017-08-13 DIAGNOSIS — R5383 Other fatigue: Secondary | ICD-10-CM | POA: Diagnosis not present

## 2017-08-13 DIAGNOSIS — Z95828 Presence of other vascular implants and grafts: Secondary | ICD-10-CM

## 2017-08-13 DIAGNOSIS — E538 Deficiency of other specified B group vitamins: Secondary | ICD-10-CM | POA: Diagnosis not present

## 2017-08-13 DIAGNOSIS — M8580 Other specified disorders of bone density and structure, unspecified site: Secondary | ICD-10-CM | POA: Insufficient documentation

## 2017-08-13 DIAGNOSIS — M129 Arthropathy, unspecified: Secondary | ICD-10-CM | POA: Insufficient documentation

## 2017-08-13 DIAGNOSIS — I700 Atherosclerosis of aorta: Secondary | ICD-10-CM

## 2017-08-13 DIAGNOSIS — K219 Gastro-esophageal reflux disease without esophagitis: Secondary | ICD-10-CM | POA: Insufficient documentation

## 2017-08-13 DIAGNOSIS — D510 Vitamin B12 deficiency anemia due to intrinsic factor deficiency: Secondary | ICD-10-CM | POA: Diagnosis not present

## 2017-08-13 DIAGNOSIS — J449 Chronic obstructive pulmonary disease, unspecified: Secondary | ICD-10-CM | POA: Diagnosis not present

## 2017-08-13 DIAGNOSIS — J90 Pleural effusion, not elsewhere classified: Secondary | ICD-10-CM

## 2017-08-13 DIAGNOSIS — D649 Anemia, unspecified: Secondary | ICD-10-CM

## 2017-08-13 DIAGNOSIS — R531 Weakness: Secondary | ICD-10-CM | POA: Insufficient documentation

## 2017-08-13 DIAGNOSIS — F329 Major depressive disorder, single episode, unspecified: Secondary | ICD-10-CM | POA: Diagnosis not present

## 2017-08-13 DIAGNOSIS — Z87891 Personal history of nicotine dependence: Secondary | ICD-10-CM | POA: Diagnosis not present

## 2017-08-13 DIAGNOSIS — Z8701 Personal history of pneumonia (recurrent): Secondary | ICD-10-CM | POA: Insufficient documentation

## 2017-08-13 DIAGNOSIS — Z9221 Personal history of antineoplastic chemotherapy: Secondary | ICD-10-CM | POA: Insufficient documentation

## 2017-08-13 DIAGNOSIS — N952 Postmenopausal atrophic vaginitis: Secondary | ICD-10-CM

## 2017-08-13 DIAGNOSIS — G4700 Insomnia, unspecified: Secondary | ICD-10-CM | POA: Insufficient documentation

## 2017-08-13 DIAGNOSIS — F419 Anxiety disorder, unspecified: Secondary | ICD-10-CM | POA: Insufficient documentation

## 2017-08-13 DIAGNOSIS — Z9981 Dependence on supplemental oxygen: Secondary | ICD-10-CM | POA: Diagnosis not present

## 2017-08-13 DIAGNOSIS — E039 Hypothyroidism, unspecified: Secondary | ICD-10-CM | POA: Diagnosis not present

## 2017-08-13 DIAGNOSIS — R0602 Shortness of breath: Secondary | ICD-10-CM | POA: Diagnosis not present

## 2017-08-13 DIAGNOSIS — Z7901 Long term (current) use of anticoagulants: Secondary | ICD-10-CM

## 2017-08-13 DIAGNOSIS — Z923 Personal history of irradiation: Secondary | ICD-10-CM

## 2017-08-13 DIAGNOSIS — C3432 Malignant neoplasm of lower lobe, left bronchus or lung: Secondary | ICD-10-CM

## 2017-08-13 DIAGNOSIS — Z7982 Long term (current) use of aspirin: Secondary | ICD-10-CM | POA: Insufficient documentation

## 2017-08-13 DIAGNOSIS — Z79899 Other long term (current) drug therapy: Secondary | ICD-10-CM | POA: Insufficient documentation

## 2017-08-13 LAB — CBC
HCT: 31.1 % — ABNORMAL LOW (ref 35.0–47.0)
HEMOGLOBIN: 10.7 g/dL — AB (ref 12.0–16.0)
MCH: 31.2 pg (ref 26.0–34.0)
MCHC: 34.2 g/dL (ref 32.0–36.0)
MCV: 91.2 fL (ref 80.0–100.0)
Platelets: 247 10*3/uL (ref 150–440)
RBC: 3.41 MIL/uL — ABNORMAL LOW (ref 3.80–5.20)
RDW: 14.2 % (ref 11.5–14.5)
WBC: 6.9 10*3/uL (ref 3.6–11.0)

## 2017-08-13 LAB — COMPREHENSIVE METABOLIC PANEL
ALK PHOS: 84 U/L (ref 38–126)
ALT: 15 U/L (ref 14–54)
ANION GAP: 8 (ref 5–15)
AST: 23 U/L (ref 15–41)
Albumin: 3.7 g/dL (ref 3.5–5.0)
BUN: 9 mg/dL (ref 6–20)
CALCIUM: 9 mg/dL (ref 8.9–10.3)
CO2: 27 mmol/L (ref 22–32)
Chloride: 98 mmol/L — ABNORMAL LOW (ref 101–111)
Creatinine, Ser: 0.82 mg/dL (ref 0.44–1.00)
Glucose, Bld: 115 mg/dL — ABNORMAL HIGH (ref 65–99)
Potassium: 3.7 mmol/L (ref 3.5–5.1)
SODIUM: 133 mmol/L — AB (ref 135–145)
TOTAL PROTEIN: 7 g/dL (ref 6.5–8.1)
Total Bilirubin: 0.4 mg/dL (ref 0.3–1.2)

## 2017-08-13 MED ORDER — HEPARIN SOD (PORK) LOCK FLUSH 100 UNIT/ML IV SOLN
500.0000 [IU] | Freq: Once | INTRAVENOUS | Status: AC
  Administered 2017-08-13: 500 [IU] via INTRAVENOUS

## 2017-08-13 MED ORDER — SODIUM CHLORIDE 0.9% FLUSH
10.0000 mL | INTRAVENOUS | Status: DC | PRN
  Administered 2017-08-13: 10 mL via INTRAVENOUS
  Filled 2017-08-13: qty 10

## 2017-08-13 MED ORDER — OXYCODONE-ACETAMINOPHEN 5-325 MG PO TABS: 1.0000 | ORAL_TABLET | ORAL | 0 refills | Status: DC | PRN

## 2017-08-13 MED ORDER — PROMETHAZINE HCL 25 MG PO TABS: 25.0000 mg | ORAL_TABLET | Freq: Four times a day (QID) | ORAL | 2 refills | Status: DC | PRN

## 2017-08-13 NOTE — Progress Notes (Signed)
Patient reports intermittent muscle spasms under both breasts, denies other concerns today.

## 2017-08-15 LAB — ACID FAST CULTURE WITH REFLEXED SENSITIVITIES

## 2017-08-15 LAB — ACID FAST CULTURE WITH REFLEXED SENSITIVITIES (MYCOBACTERIA): Acid Fast Culture: NEGATIVE

## 2017-09-01 ENCOUNTER — Other Ambulatory Visit: Payer: Self-pay | Admitting: *Deleted

## 2017-09-01 ENCOUNTER — Telehealth: Payer: Self-pay | Admitting: *Deleted

## 2017-09-01 DIAGNOSIS — Z95828 Presence of other vascular implants and grafts: Secondary | ICD-10-CM

## 2017-09-01 NOTE — Telephone Encounter (Signed)
Jill Gross called and stated she was to have her port removed and they have not heard anything about an appointment with Dr Genevive Bi to get it removed. Please advise/ arrange this to be done and notify them.

## 2017-09-01 NOTE — Telephone Encounter (Signed)
I will follow up with Dr. Genevive Bi office. Thanks.

## 2017-09-04 ENCOUNTER — Ambulatory Visit (INDEPENDENT_AMBULATORY_CARE_PROVIDER_SITE_OTHER): Payer: Managed Care, Other (non HMO) | Admitting: Cardiothoracic Surgery

## 2017-09-04 ENCOUNTER — Encounter: Payer: Self-pay | Admitting: Cardiothoracic Surgery

## 2017-09-04 ENCOUNTER — Encounter
Admission: RE | Admit: 2017-09-04 | Discharge: 2017-09-04 | Disposition: A | Discharge status: Home / Self Care | Payer: Managed Care, Other (non HMO) | Source: Ambulatory Visit | Attending: Cardiothoracic Surgery | Admitting: Cardiothoracic Surgery

## 2017-09-04 VITALS — BP 113/74 | HR 112 | Temp 97.7°F | Ht 63.0 in | Wt 143.0 lb

## 2017-09-04 DIAGNOSIS — C3402 Malignant neoplasm of left main bronchus: Secondary | ICD-10-CM

## 2017-09-04 DIAGNOSIS — Z01818 Encounter for other preprocedural examination: Secondary | ICD-10-CM | POA: Insufficient documentation

## 2017-09-04 HISTORY — DX: Personal history of other diseases of the musculoskeletal system and connective tissue: Z87.39

## 2017-09-04 HISTORY — DX: Dorsalgia, unspecified: M54.9

## 2017-09-04 HISTORY — DX: Autoimmune thyroiditis: E06.3

## 2017-09-04 LAB — PROTIME-INR
INR: 1.06
Prothrombin Time: 13.7 seconds (ref 11.4–15.2)

## 2017-09-04 LAB — CBC WITH DIFFERENTIAL/PLATELET
Basophils Absolute: 0 10*3/uL (ref 0–0.1)
Basophils Relative: 0 %
Eosinophils Absolute: 0.1 10*3/uL (ref 0–0.7)
Eosinophils Relative: 1 %
HEMATOCRIT: 36.1 % (ref 35.0–47.0)
HEMOGLOBIN: 12.3 g/dL (ref 12.0–16.0)
LYMPHS ABS: 1.5 10*3/uL (ref 1.0–3.6)
LYMPHS PCT: 19 %
MCH: 30.8 pg (ref 26.0–34.0)
MCHC: 34 g/dL (ref 32.0–36.0)
MCV: 90.8 fL (ref 80.0–100.0)
Monocytes Absolute: 0.5 10*3/uL (ref 0.2–0.9)
Monocytes Relative: 6 %
NEUTROS ABS: 5.9 10*3/uL (ref 1.4–6.5)
Neutrophils Relative %: 74 %
Platelets: 277 10*3/uL (ref 150–440)
RBC: 3.97 MIL/uL (ref 3.80–5.20)
RDW: 14.7 % — ABNORMAL HIGH (ref 11.5–14.5)
WBC: 8 10*3/uL (ref 3.6–11.0)

## 2017-09-04 LAB — BASIC METABOLIC PANEL
Anion gap: 10 (ref 5–15)
BUN: 11 mg/dL (ref 6–20)
CHLORIDE: 98 mmol/L — AB (ref 101–111)
CO2: 28 mmol/L (ref 22–32)
Calcium: 9.7 mg/dL (ref 8.9–10.3)
Creatinine, Ser: 0.93 mg/dL (ref 0.44–1.00)
GFR calc Af Amer: >60 mL/min (ref >60)
GFR calc non Af Amer: >60 mL/min (ref >60)
GLUCOSE: 134 mg/dL — AB (ref 65–99)
POTASSIUM: 3.5 mmol/L (ref 3.5–5.1)
Sodium: 136 mmol/L (ref 135–145)

## 2017-09-04 LAB — APTT: aPTT: 34 seconds (ref 24–36)

## 2017-09-04 NOTE — Patient Instructions (Addendum)
We are sending you down to pre-admit testing today. Located on the first floor on the left.  We have scheduled for your port removal surgery to be on Tuesday Oct. 2nd with Dr. Genevive Bi.  Please see your blue sheet for further instructions.

## 2017-09-04 NOTE — Patient Instructions (Signed)
Your procedure is scheduled on: Tuesday, September 08, 2017 Report to Same Day Surgery on the 2nd floor in the [facility]. To find out your arrival time, please call [phone] between 1PM - 3PM on: Monday, September 07, 2017  REMEMBER: Instructions that are not followed completely may result in serious medical risk up to and including death; or upon the discretion of your surgeon and anesthesiologist your surgery may need to be rescheduled.  Do not eat food or drink liquids after midnight. No gum chewing or hard candies.  You may however, drink CLEAR liquids up to 2 hours before you are scheduled to arrive at the hospital for your procedure.  Do not drink clear liquids within 2 hours of your scheduled arrival to the hospital as this may lead to your procedure being delayed or rescheduled.  Clear liquids include: - water  - apple juice without pulp - clear gatorade - black coffee or tea (NO milk, creamers, sugars) DO NOT drink anything not on this list.  Type 1 and Type 2 diabetics should only drink water.  No Alcohol for 24 hours before or after surgery.  No Smoking for 24 hours prior to surgery.  Notify your doctor if there is any change in your medical condition (cold, fever, infection).  Do not wear jewelry, make-up, hairpins, clips or nail polish.  Do not wear lotions, powders, or perfumes.   Do not shave 48 hours prior to surgery. Men may shave face and neck.  Contacts and dentures may not be worn into surgery.  Do not bring valuables to the hospital. [facility] is not responsible for any belongings or valuables.   TAKE THESE MEDICATIONS THE MORNING OF SURGERY WITH A SIP OF WATER:  1.  SYNTHROID 2.  ZANTAC  Use CHG Soap or wipes as directed on instruction sheet.  Follow recommendations from Cardiologist, Pulmonologist or PCP regarding stopping Aspirin. TODAY  NOW!  Stop Anti-inflammatories such as Advil, Aleve, Ibuprofen, Motrin, Naproxen, Naprosyn, Goodie powder,  or aspirin products. (May take Tylenol or Acetaminophen if needed.)  NOW!  Stop supplements until after surgery.   If you are being discharged the day of surgery, you will not be allowed to drive home. You will need someone to drive you home and stay with you that night.   If you are taking public transportation, you will need to have a responsible adult to with you.  Please call the number above if you have any questions about these instructions.

## 2017-09-04 NOTE — Progress Notes (Signed)
  Patient ID: Jill Gross, female   DOB: 1956-11-12, 61 y.o.   MRN: [id number]  HISTORY: This is a 61 year old woman with a history of left-sided lung cancer who comes in today wishing to have her Port-A-Cath removed. This was placed about a year and a half ago she is now completed all therapy. She's been getting her catheter flushed once a month but his had difficulties with that including bruising and bleeding around the Port-A-Cath site. She states that she is not going to have any additional chemotherapy. She would like to be put to sleep for this but I explained her we usually do this with intravenous sedation and local anesthetics. She would like to review that with our preop holding areas. Did independently review her chest CT. There is essentially no ventilation to the left the lung.   Vitals:   09/04/17 1055  BP: 113/74  Pulse: (!) 112  Temp: 97.7 F (36.5 C)     EXAM:    Resp: Lungs Show slight wheezes on the right. There are no breath sounds on the left..  No respiratory distress, normal effort. Heart:  Regular without murmurs though slightly tachycardic Abd:  Abdomen is soft, non distended and non tender. No masses are palpable.  There is no rebound and no guarding.  Neurological: Alert and oriented to person, place, and time. Coordination normal.  Skin: Skin is warm and dry. No rash noted. No diaphoretic. No erythema. No pallor.  Psychiatric: Normal mood and affect. Normal behavior. Judgment and thought content normal.   The Port-A-Cath site is well approximated on the right chest wall. There is no palpable deformities. There may be a small suture sticking up underneath the skin.   ASSESSMENT: Nonfunctioning Port-A-Cath   PLAN:   She has desired to have it removed. We'll go ahead and do that. I did explain to her the risks involved including risks of bleeding, infection, and death.    [name], MD

## 2017-09-07 ENCOUNTER — Telehealth: Payer: Self-pay | Admitting: Cardiothoracic Surgery

## 2017-09-07 MED ORDER — CEFAZOLIN SODIUM-DEXTROSE 2-4 GM/100ML-% IV SOLN
2.0000 g | INTRAVENOUS | Status: AC
  Administered 2017-09-08: 2 g via INTRAVENOUS

## 2017-09-07 NOTE — Telephone Encounter (Signed)
Pt advised of pre op date/time and sx date. Sx: 09/08/17 with Dr Rolley Sims Placement Pre op: 09/04/17-office.   Patient made aware to call [phone], between 1-3:00pm the day before surgery, to find out what time to arrive.

## 2017-09-08 ENCOUNTER — Ambulatory Visit: Payer: Managed Care, Other (non HMO) | Admitting: Anesthesiology

## 2017-09-08 ENCOUNTER — Encounter: Payer: Self-pay | Admitting: *Deleted

## 2017-09-08 ENCOUNTER — Encounter
Admission: RE | Disposition: A | Discharge status: Home / Self Care | Payer: Self-pay | Source: Ambulatory Visit | Attending: Cardiothoracic Surgery

## 2017-09-08 ENCOUNTER — Ambulatory Visit
Admission: RE | Admit: 2017-09-08 | Discharge: 2017-09-08 | Disposition: A | Discharge status: Home / Self Care | Payer: Managed Care, Other (non HMO) | Source: Ambulatory Visit | Attending: Cardiothoracic Surgery | Admitting: Cardiothoracic Surgery

## 2017-09-08 DIAGNOSIS — Z9221 Personal history of antineoplastic chemotherapy: Secondary | ICD-10-CM | POA: Insufficient documentation

## 2017-09-08 DIAGNOSIS — Z452 Encounter for adjustment and management of vascular access device: Secondary | ICD-10-CM | POA: Insufficient documentation

## 2017-09-08 DIAGNOSIS — Z923 Personal history of irradiation: Secondary | ICD-10-CM | POA: Insufficient documentation

## 2017-09-08 DIAGNOSIS — E039 Hypothyroidism, unspecified: Secondary | ICD-10-CM | POA: Insufficient documentation

## 2017-09-08 DIAGNOSIS — M1990 Unspecified osteoarthritis, unspecified site: Secondary | ICD-10-CM | POA: Diagnosis not present

## 2017-09-08 DIAGNOSIS — E785 Hyperlipidemia, unspecified: Secondary | ICD-10-CM | POA: Insufficient documentation

## 2017-09-08 DIAGNOSIS — C3402 Malignant neoplasm of left main bronchus: Secondary | ICD-10-CM | POA: Diagnosis not present

## 2017-09-08 DIAGNOSIS — J449 Chronic obstructive pulmonary disease, unspecified: Secondary | ICD-10-CM | POA: Insufficient documentation

## 2017-09-08 DIAGNOSIS — Z85118 Personal history of other malignant neoplasm of bronchus and lung: Secondary | ICD-10-CM | POA: Diagnosis not present

## 2017-09-08 HISTORY — PX: PORT-A-CATH REMOVAL: SHX5289

## 2017-09-08 SURGERY — REMOVAL PORT-A-CATH
Anesthesia: General | Wound class: Clean

## 2017-09-08 MED ORDER — LIDOCAINE HCL (PF) 1 % IJ SOLN
INTRAMUSCULAR | Status: AC
  Filled 2017-09-08: qty 30

## 2017-09-08 MED ORDER — PROPOFOL 500 MG/50ML IV EMUL
INTRAVENOUS | Status: AC
  Filled 2017-09-08: qty 50

## 2017-09-08 MED ORDER — CHLORHEXIDINE GLUCONATE CLOTH 2 % EX PADS
6.0000 | MEDICATED_PAD | Freq: Once | CUTANEOUS | Status: AC
  Administered 2017-09-08: 6 via TOPICAL

## 2017-09-08 MED ORDER — LIDOCAINE HCL (CARDIAC) 20 MG/ML IV SOLN
INTRAVENOUS | Status: DC | PRN
  Administered 2017-09-08: 60 mg via INTRATRACHEAL

## 2017-09-08 MED ORDER — PROMETHAZINE HCL 25 MG/ML IJ SOLN: 6.2500 mg | INTRAMUSCULAR | Status: DC | PRN

## 2017-09-08 MED ORDER — CHLORHEXIDINE GLUCONATE CLOTH 2 % EX PADS: 6.0000 | MEDICATED_PAD | Freq: Once | CUTANEOUS | Status: DC

## 2017-09-08 MED ORDER — CEFAZOLIN SODIUM-DEXTROSE 2-4 GM/100ML-% IV SOLN
INTRAVENOUS | Status: AC
  Filled 2017-09-08: qty 100

## 2017-09-08 MED ORDER — LIDOCAINE HCL (PF) 2 % IJ SOLN
INTRAMUSCULAR | Status: AC
  Filled 2017-09-08: qty 4

## 2017-09-08 MED ORDER — LACTATED RINGERS IV SOLN
INTRAVENOUS | Status: DC
  Administered 2017-09-08: 07:00:00 via INTRAVENOUS

## 2017-09-08 MED ORDER — DEXAMETHASONE SODIUM PHOSPHATE 10 MG/ML IJ SOLN
INTRAMUSCULAR | Status: DC | PRN
  Administered 2017-09-08: 5 mg via INTRAVENOUS

## 2017-09-08 MED ORDER — FENTANYL CITRATE (PF) 100 MCG/2ML IJ SOLN
INTRAMUSCULAR | Status: AC
  Filled 2017-09-08: qty 2

## 2017-09-08 MED ORDER — PROPOFOL 10 MG/ML IV BOLUS
INTRAVENOUS | Status: DC | PRN
  Administered 2017-09-08: 50 mg via INTRAVENOUS

## 2017-09-08 MED ORDER — MIDAZOLAM HCL 2 MG/2ML IJ SOLN
INTRAMUSCULAR | Status: AC
  Filled 2017-09-08: qty 2

## 2017-09-08 MED ORDER — FENTANYL CITRATE (PF) 100 MCG/2ML IJ SOLN
INTRAMUSCULAR | Status: DC | PRN
  Administered 2017-09-08 (×4): 25 ug via INTRAVENOUS

## 2017-09-08 MED ORDER — FENTANYL CITRATE (PF) 100 MCG/2ML IJ SOLN: 25.0000 ug | INTRAMUSCULAR | Status: DC | PRN

## 2017-09-08 MED ORDER — PROPOFOL 500 MG/50ML IV EMUL
INTRAVENOUS | Status: DC | PRN
  Administered 2017-09-08: 100 ug/kg/min via INTRAVENOUS

## 2017-09-08 MED ORDER — IPRATROPIUM-ALBUTEROL 0.5-2.5 (3) MG/3ML IN SOLN
RESPIRATORY_TRACT | Status: AC
  Filled 2017-09-08: qty 3

## 2017-09-08 MED ORDER — IPRATROPIUM-ALBUTEROL 0.5-2.5 (3) MG/3ML IN SOLN
3.0000 mL | Freq: Four times a day (QID) | RESPIRATORY_TRACT | Status: DC
  Administered 2017-09-08: 3 mL via RESPIRATORY_TRACT

## 2017-09-08 MED ORDER — ONDANSETRON HCL 4 MG/2ML IJ SOLN
INTRAMUSCULAR | Status: AC
  Filled 2017-09-08: qty 2

## 2017-09-08 MED ORDER — DEXAMETHASONE SODIUM PHOSPHATE 10 MG/ML IJ SOLN
INTRAMUSCULAR | Status: AC
  Filled 2017-09-08: qty 1

## 2017-09-08 MED ORDER — MIDAZOLAM HCL 2 MG/2ML IJ SOLN
INTRAMUSCULAR | Status: DC | PRN
  Administered 2017-09-08: 2 mg via INTRAVENOUS

## 2017-09-08 MED ORDER — LIDOCAINE HCL (PF) 1 % IJ SOLN
INTRAMUSCULAR | Status: DC | PRN
  Administered 2017-09-08: 12 mL

## 2017-09-08 MED ORDER — ONDANSETRON HCL 4 MG/2ML IJ SOLN
INTRAMUSCULAR | Status: DC | PRN
  Administered 2017-09-08: 4 mg via INTRAVENOUS

## 2017-09-08 MED ORDER — CEFAZOLIN SODIUM-DEXTROSE 2-3 GM-% IV SOLR: INTRAVENOUS | Status: DC | PRN

## 2017-09-08 SURGICAL SUPPLY — 28 items
BAG BIOHAZARD 6X9 CLR ZIPLOCK (MISCELLANEOUS) ×2 IMPLANT
BAG SPEC THK2 9X6 BHZR CLR (MISCELLANEOUS) ×1
CANISTER SUCT 1200ML W/VALVE (MISCELLANEOUS) ×2 IMPLANT
CHLORAPREP W/TINT 26ML (MISCELLANEOUS) ×2 IMPLANT
COVER LIGHT HANDLE STERIS (MISCELLANEOUS) ×4 IMPLANT
DRAPE INCISE IOBAN 66X45 STRL (DRAPES) ×2 IMPLANT
DRSG TEGADERM 4X4.75 (GAUZE/BANDAGES/DRESSINGS) ×2 IMPLANT
DRSG TELFA 4X3 1S NADH ST (GAUZE/BANDAGES/DRESSINGS) ×2 IMPLANT
ELECT REM PT RETURN 9FT ADLT (ELECTROSURGICAL) ×2
ELECTRODE REM PT RTRN 9FT ADLT (ELECTROSURGICAL) ×1 IMPLANT
GAUZE SPONGE 4X4 12PLY STRL (GAUZE/BANDAGES/DRESSINGS) ×2 IMPLANT
GLOVE SURG SYN 7.5  E (GLOVE) ×1
GLOVE SURG SYN 7.5 E (GLOVE) ×1 IMPLANT
GLOVE SURG SYN 7.5 PF PI (GLOVE) ×1 IMPLANT
GOWN STRL REUS W/ TWL LRG LVL3 (GOWN DISPOSABLE) ×2 IMPLANT
GOWN STRL REUS W/TWL LRG LVL3 (GOWN DISPOSABLE) ×4
KIT RM TURNOVER STRD PROC AR (KITS) ×2 IMPLANT
LABEL OR SOLS (LABEL) ×2 IMPLANT
MARKER SKIN DUAL TIP RULER LAB (MISCELLANEOUS) ×1 IMPLANT
PACK PORT-A-CATH (MISCELLANEOUS) ×2 IMPLANT
STRIP CLOSURE SKIN 1/4X4 (GAUZE/BANDAGES/DRESSINGS) ×2 IMPLANT
SUT ETHILON 4-0 (SUTURE) ×2
SUT ETHILON 4-0 FS2 18XMFL BLK (SUTURE) ×1
SUT VIC AB 3-0 SH 27 (SUTURE) ×2
SUT VIC AB 3-0 SH 27X BRD (SUTURE) ×1 IMPLANT
SUT VIC AB 4-0 FS2 27 (SUTURE) ×1 IMPLANT
SUTURE ETHLN 4-0 FS2 18XMF BLK (SUTURE) ×1 IMPLANT
TAPE TRANSPORE STRL 2 31045 (GAUZE/BANDAGES/DRESSINGS) ×1 IMPLANT

## 2017-09-08 NOTE — Anesthesia Post-op Follow-up Note (Signed)
Anesthesia QCDR form completed.        

## 2017-09-08 NOTE — Anesthesia Postprocedure Evaluation (Signed)
Anesthesia Post Note  Patient: Jill Gross  Procedure(s) Performed: REMOVAL PORT-A-CATH (N/A )  Patient location during evaluation: PACU Anesthesia Type: General Level of consciousness: awake and alert Pain management: pain level controlled Vital Signs Assessment: post-procedure vital signs reviewed and stable Respiratory status: spontaneous breathing, nonlabored ventilation, respiratory function stable and patient connected to nasal cannula oxygen Cardiovascular status: blood pressure returned to baseline and stable Postop Assessment: no apparent nausea or vomiting Anesthetic complications: no     Last Vitals:  Vitals:   09/08/17 0858 09/08/17 0926  BP:  130/72  Pulse:  95  Resp:  14  Temp: (!) 36.2 C   SpO2:  100%    Last Pain:  Vitals:   09/08/17 0858  TempSrc:   PainSc: 0-No pain                 Martha Clan

## 2017-09-08 NOTE — Op Note (Signed)
  09/08/2017  8:41 AM  PATIENT:  Jill Gross  61 y.o. female  PRE-OPERATIVE DIAGNOSIS:  Right internal jugular Port-A-Cath  POST-OPERATIVE DIAGNOSIS:  Same  PROCEDURE:  Removal of Port-A-Cath  SURGEON:  Surgeon(s) and Role:    [name], MD - Primary  ASSISTANTS: None  ANESTHESIA: Local with anesthesia standby  INDICATIONS FOR PROCEDURE this patient is a 61 year old woman who is now completed all her therapy for her lung cancer. She is finding it difficult to have her Port-A-Cath flushed and has recently experienced some extravasation around the port. This was removed at her request. The indications and risks were explained the patient gave her informed consent.  DICTATION: The patient was brought to the operating suite and placed in the supine position. The patient was prepped and draped in usual sterile fashion. Monitored anesthesia care was provided. Using 1% lidocaine A skin well was raised over the prior incision. The incision was deepened down through the subcutaneous tissues until the Port-A-Cath was identified. It was then removed from the vein.  The catheter had been secured with 3 interrupted Prolene sutures and these were removed. The pseudocapsule was also excised. A total of 11 cc of lidocaine was used. The deeper tissues were closed with interrupted Vicryl. The subcutaneous tissues were closed with interrupted Vicryl and finally the skin was closed with nylon. Sterile dressings were applied.  The patient was then transported to the recovery room in stable condition.   [name], MD

## 2017-09-08 NOTE — H&P (View-Only) (Signed)
  Patient ID: Jill Gross, female   DOB: [birth year], 61 y.o.   MRN: [id number]  HISTORY: This is a 61 year old woman with a history of left-sided lung cancer who comes in today wishing to have her Port-A-Cath removed. This was placed about a year and a half ago she is now completed all therapy. She's been getting her catheter flushed once a month but his had difficulties with that including bruising and bleeding around the Port-A-Cath site. She states that she is not going to have any additional chemotherapy. She would like to be put to sleep for this but I explained her we usually do this with intravenous sedation and local anesthetics. She would like to review that with our preop holding areas. Did independently review her chest CT. There is essentially no ventilation to the left the lung.   Vitals:   09/04/17 1055  BP: 113/74  Pulse: (!) 112  Temp: 97.7 F (36.5 C)     EXAM:    Resp: Lungs Show slight wheezes on the right. There are no breath sounds on the left..  No respiratory distress, normal effort. Heart:  Regular without murmurs though slightly tachycardic Abd:  Abdomen is soft, non distended and non tender. No masses are palpable.  There is no rebound and no guarding.  Neurological: Alert and oriented to person, place, and time. Coordination normal.  Skin: Skin is warm and dry. No rash noted. No diaphoretic. No erythema. No pallor.  Psychiatric: Normal mood and affect. Normal behavior. Judgment and thought content normal.   The Port-A-Cath site is well approximated on the right chest wall. There is no palpable deformities. There may be a small suture sticking up underneath the skin.   ASSESSMENT: Nonfunctioning Port-A-Cath   PLAN:   She has desired to have it removed. We'll go ahead and do that. I did explain to her the risks involved including risks of bleeding, infection, and death.    [name], MD

## 2017-09-08 NOTE — Progress Notes (Signed)
Duoneb administered preop, tolerated well.  Right lung clear to auscultation, no breath sounds appreciated on the left side.

## 2017-09-08 NOTE — Anesthesia Preprocedure Evaluation (Signed)
Anesthesia Evaluation  Patient identified by MRN, date of birth, ID band Patient awake    Reviewed: Allergy & Precautions, NPO status , Patient's Chart, lab work & pertinent test results  History of Anesthesia Complications Negative for: history of anesthetic complications  Airway Mallampati: II       Dental  (+) Upper Dentures, Lower Dentures   Pulmonary shortness of breath, COPD,  COPD inhaler, neg recent URI, former smoker,     + decreased breath sounds      Cardiovascular Exercise Tolerance: Good negative cardio ROS       Neuro/Psych PSYCHIATRIC DISORDERS (Depression)    GI/Hepatic Neg liver ROS, GERD  Medicated,  Endo/Other  neg diabetesHypothyroidism   Renal/GU negative Renal ROS     Musculoskeletal   Abdominal Normal abdominal exam  (+)   Peds  Hematology  (+) anemia ,   Anesthesia Other Findings Past Medical History: No date: Allergy No date: Anemia     Comment:  pernicious No date: Anxiety No date: Arthritis No date: Atrophic vaginitis No date: Back pain No date: COPD (chronic obstructive pulmonary disease) ([facility]) No date: Depression No date: Diverticulosis No date: Dyspnea No date: Family history of adverse reaction to anesthesia     Comment:  MOM-BUT UNSURE WHAT REACTION WAS No date: Fatigue No date: GERD (gastroesophageal reflux disease) No date: H/O degenerative disc disease No date: Hashimoto's disease 04/2016: History of chemotherapy No date: Hyperlipidemia No date: Hypothyroid No date: Insomnia No date: Low back pain No date: Lumbago No date: Neuropathy due to chemotherapeutic drug [facility]) No date: Osteopenia 11/2016: Pneumonia 01/2016: Small cell carcinoma of left lung ([facility])     Comment:  rad + chemo tx's No date: Tobacco use No date: Vitamin B12 deficiency'  Reproductive/Obstetrics                             Anesthesia Physical  Anesthesia  Plan  ASA: III  Anesthesia Plan: General   Post-op Pain Management:  Regional for Post-op pain   Induction: Intravenous  PONV Risk Score and Plan: 3 and Ondansetron, Dexamethasone, Propofol infusion and Treatment may vary due to age or medical condition  Airway Management Planned: Natural Airway and Nasal Cannula  Additional Equipment:   Intra-op Plan:   Post-operative Plan: Extubation in OR  Informed Consent: I have reviewed the patients History and Physical, chart, labs and discussed the procedure including the risks, benefits and alternatives for the proposed anesthesia with the patient or authorized representative who has indicated his/her understanding and acceptance.     Plan Discussed with: CRNA  Anesthesia Plan Comments:         Anesthesia Quick Evaluation

## 2017-09-08 NOTE — Discharge Instructions (Signed)

## 2017-09-08 NOTE — Interval H&P Note (Signed)
History and Physical Interval Note:  09/08/2017 7:06 AM  Jill Gross  has presented today for surgery, with the diagnosis of port a cath  The various methods of treatment have been discussed with the patient and family. After consideration of risks, benefits and other options for treatment, the patient has consented to  Procedure(s): REMOVAL PORT-A-CATH (N/A) as a surgical intervention .  The patient's history has been reviewed, patient examined, no change in status, stable for surgery.  I have reviewed the patient's chart and labs.  Questions were answered to the patient's satisfaction.     [name]

## 2017-09-08 NOTE — Transfer of Care (Signed)
Immediate Anesthesia Transfer of Care Note  Patient: Jill Gross  Procedure(s) Performed: REMOVAL PORT-A-CATH (N/A )  Patient Location: PACU  Anesthesia Type:General  Level of Consciousness: sedated  Airway & Oxygen Therapy: Patient connected to nasal cannula oxygen  Post-op Assessment: Post -op Vital signs reviewed and stable  Post vital signs: stable  Last Vitals:  Vitals:   09/08/17 0625 09/08/17 0817  BP: 114/68 (!) 84/49  Pulse: (!) 117 92  Resp: 16 16  Temp: (!) 36.4 C (!) 36.2 C  SpO2: 100% 97%    Last Pain:  Vitals:   09/08/17 0817  TempSrc: Temporal         Complications: No apparent anesthesia complications

## 2017-09-18 ENCOUNTER — Other Ambulatory Visit: Payer: Self-pay | Admitting: Unknown Physician Specialty

## 2017-09-21 ENCOUNTER — Other Ambulatory Visit: Payer: Self-pay | Admitting: Unknown Physician Specialty

## 2017-09-21 NOTE — Telephone Encounter (Signed)
Ambien called into the pharmacy.

## 2017-09-25 ENCOUNTER — Ambulatory Visit (INDEPENDENT_AMBULATORY_CARE_PROVIDER_SITE_OTHER): Payer: Managed Care, Other (non HMO) | Admitting: Cardiothoracic Surgery

## 2017-09-25 ENCOUNTER — Encounter: Payer: Self-pay | Admitting: Cardiothoracic Surgery

## 2017-09-25 VITALS — BP 106/74 | HR 112 | Temp 97.7°F | Ht 63.0 in | Wt 146.6 lb

## 2017-09-25 DIAGNOSIS — R918 Other nonspecific abnormal finding of lung field: Secondary | ICD-10-CM

## 2017-09-25 NOTE — Progress Notes (Signed)
She came back today for suture removal. She has no additional complaints. Her wounds are all healed. We did remove her sutures. No follow-up was made.

## 2017-09-25 NOTE — Patient Instructions (Signed)
Please call our office if you have any questions or concerns.  Continue your follow-up care as scheduled in the [facility].

## 2017-09-30 ENCOUNTER — Telehealth: Payer: Self-pay

## 2017-09-30 MED ORDER — BENZONATATE 200 MG PO CAPS: 200.0000 mg | ORAL_CAPSULE | Freq: Two times a day (BID) | ORAL | 0 refills | Status: DC | PRN

## 2017-09-30 NOTE — Telephone Encounter (Signed)
Called and let patient know about medications. Patient verbalized understanding.

## 2017-09-30 NOTE — Telephone Encounter (Signed)
Copied from [place]. Topic: General - Other >> Sep 30, 2017 10:38 AM Patrice Paradise wrote: Reason for CRM: Patient would like for [name] to call her something in for coughing. She stated that she's being coughing for a few days now. >> Sep 30, 2017  2:31 PM Don Perking M wrote: Duplicate chart, ticket has been placed to have them merged.    Routing to provider.

## 2017-09-30 NOTE — Telephone Encounter (Signed)
Pt needs to be seen.  I will give Tessalon Perles but should be seen for Tussionex

## 2017-10-14 ENCOUNTER — Encounter: Payer: Self-pay | Admitting: Unknown Physician Specialty

## 2017-10-14 ENCOUNTER — Ambulatory Visit (INDEPENDENT_AMBULATORY_CARE_PROVIDER_SITE_OTHER): Payer: Managed Care, Other (non HMO) | Admitting: Unknown Physician Specialty

## 2017-10-14 VITALS — BP 106/70 | HR 106 | Temp 98.4°F | Wt 149.6 lb

## 2017-10-14 DIAGNOSIS — R059 Cough, unspecified: Secondary | ICD-10-CM

## 2017-10-14 DIAGNOSIS — R0902 Hypoxemia: Secondary | ICD-10-CM | POA: Diagnosis not present

## 2017-10-14 DIAGNOSIS — E039 Hypothyroidism, unspecified: Secondary | ICD-10-CM

## 2017-10-14 DIAGNOSIS — E785 Hyperlipidemia, unspecified: Secondary | ICD-10-CM

## 2017-10-14 DIAGNOSIS — Z23 Encounter for immunization: Secondary | ICD-10-CM

## 2017-10-14 DIAGNOSIS — R05 Cough: Secondary | ICD-10-CM

## 2017-10-14 DIAGNOSIS — G4700 Insomnia, unspecified: Secondary | ICD-10-CM | POA: Diagnosis not present

## 2017-10-14 DIAGNOSIS — I10 Essential (primary) hypertension: Secondary | ICD-10-CM | POA: Diagnosis not present

## 2017-10-14 MED ORDER — HYDROCOD POLST-CPM POLST ER 10-8 MG/5ML PO SUER: 5.0000 mL | Freq: Two times a day (BID) | ORAL | 0 refills | Status: DC

## 2017-10-14 NOTE — Assessment & Plan Note (Signed)
Persistent cough.  Taking inhalers.  Will rx Tussionex.  Antibiotics if persistent

## 2017-10-14 NOTE — Assessment & Plan Note (Signed)
Stable, continue present medications.   

## 2017-10-14 NOTE — Assessment & Plan Note (Addendum)
Stable, continue present medications.  Check lipid panel

## 2017-10-14 NOTE — Assessment & Plan Note (Signed)
Using O2 2 Liters most of the time.  Continue present use

## 2017-10-14 NOTE — Progress Notes (Signed)
BP 106/70   Pulse (!) 106   Temp 98.4 F (36.9 C) (Oral)   Wt 149 lb 9.6 oz (67.9 kg)   LMP  (LMP Unknown)   SpO2 98%   BMI 26.50 kg/m    Subjective:    Patient ID: Jill Gross, female    DOB: 07/02/1956, 61 y.o.   MRN: [id number]  HPI: Jill Gross is a 61 y.o. female  Chief Complaint  Patient presents with  . Depression  . Hyperlipidemia  . Hypertension  . Cough    pt states she still has a cough, states the tessaol pearls help some but not a lot   . Flu Vaccine    pt wants to discuss flu vaccine before getting it    Hypertension Using medications without difficulty Average home BPs   No problems or lightheadedness No chest pain with exertion or shortness of breath No Edema   Hyperlipidemia Using medications without problems: No Muscle aches  Diet compliance:Exercise: No exercise        Depression States her mood is good Depression screen [facility] 2/9 10/14/2017 07/15/2017 04/30/2016 01/22/2016  Decreased Interest 0 0 0 0  Down, Depressed, Hopeless 0 0 0 0  PHQ - 2 Score 0 0 0 0  Altered sleeping 0 - - -  Tired, decreased energy 3 - - -  Change in appetite 0 - - -  Feeling bad or failure about yourself  0 - - -  Trouble concentrating 0 - - -  Moving slowly or fidgety/restless 0 - - -  Suicidal thoughts 0 - - -  PHQ-9 Score 3 - - -  Some recent data might be hidden   Having back pain after a procedure in the back in drawing fluid off and muscle spasms in the chest wall.    Cough Rough cough, runny nose, and coughing up purulent phlegm for about 1 week    Insomnia Dependent on Ambien for quite some time.  Takes 10 mg QHS  Hypothyroid TSH low last visit.    Hypoxia On 2 Liters O2. Most of the time    Relevant past medical, surgical, family and social history reviewed and updated as indicated. Interim medical history since our last visit reviewed. Allergies and medications reviewed and updated.  Review of Systems  Per HPI unless specifically  indicated above     Objective:    BP 106/70   Pulse (!) 106   Temp 98.4 F (36.9 C) (Oral)   Wt 149 lb 9.6 oz (67.9 kg)   LMP  (LMP Unknown)   SpO2 98%   BMI 26.50 kg/m   Wt Readings from Last 3 Encounters:  10/14/17 149 lb 9.6 oz (67.9 kg)  09/25/17 146 lb 9.6 oz (66.5 kg)  09/04/17 141 lb 8 oz (64.2 kg)    Physical Exam  Constitutional: She is oriented to person, place, and time. She appears well-developed and well-nourished. No distress.  HENT:  Head: Normocephalic and atraumatic.  Eyes: Conjunctivae and lids are normal. Right eye exhibits no discharge. Left eye exhibits no discharge. No scleral icterus.  Neck: Normal range of motion. Neck supple. No JVD present. Carotid bruit is not present.  Cardiovascular: Normal rate, regular rhythm and normal heart sounds.  Pulmonary/Chest: Effort normal. She has decreased breath sounds in the left lower field.  Abdominal: Normal appearance. There is no splenomegaly or hepatomegaly.  Musculoskeletal: Normal range of motion.  Neurological: She is alert and oriented to person, place, and  time.  Skin: Skin is warm, dry and intact. No rash noted. No pallor.  Psychiatric: She has a normal mood and affect. Her behavior is normal. Judgment and thought content normal.    Results for orders placed or performed during the hospital encounter of 09/04/17  APTT  Result Value Ref Range   aPTT 34 24 - 36 seconds  Basic metabolic panel  Result Value Ref Range   Sodium 136 135 - 145 mmol/L   Potassium 3.5 3.5 - 5.1 mmol/L   Chloride 98 (L) 101 - 111 mmol/L   CO2 28 22 - 32 mmol/L   Glucose, Bld 134 (H) 65 - 99 mg/dL   BUN 11 6 - 20 mg/dL   Creatinine, Ser 0.93 0.44 - 1.00 mg/dL   Calcium 9.7 8.9 - 10.3 mg/dL   GFR calc non Af Amer >60 >60 mL/min   GFR calc Af Amer >60 >60 mL/min   Anion gap 10 5 - 15  CBC WITH DIFFERENTIAL  Result Value Ref Range   WBC 8.0 3.6 - 11.0 K/uL   RBC 3.97 3.80 - 5.20 MIL/uL   Hemoglobin 12.3 12.0 - 16.0 g/dL     HCT 36.1 35.0 - 47.0 %   MCV 90.8 80.0 - 100.0 fL   MCH 30.8 26.0 - 34.0 pg   MCHC 34.0 32.0 - 36.0 g/dL   RDW 14.7 (H) 11.5 - 14.5 %   Platelets 277 150 - 440 K/uL   Neutrophils Relative % 74 %   Neutro Abs 5.9 1.4 - 6.5 K/uL   Lymphocytes Relative 19 %   Lymphs Abs 1.5 1.0 - 3.6 K/uL   Monocytes Relative 6 %   Monocytes Absolute 0.5 0.2 - 0.9 K/uL   Eosinophils Relative 1 %   Eosinophils Absolute 0.1 0 - 0.7 K/uL   Basophils Relative 0 %   Basophils Absolute 0.0 0 - 0.1 K/uL  Protime-INR  Result Value Ref Range   Prothrombin Time 13.7 11.4 - 15.2 seconds   INR 1.06       Assessment & Plan:   Problem List Items Addressed This Visit      Unprioritized   Cough    Persistent cough.  Taking inhalers.  Will rx Tussionex.  Antibiotics if persistent      Hyperlipidemia    Stable, continue present medications.  Check lipid panel       Hypertension - Primary    Stable, continue present medications.        Relevant Orders   Comprehensive metabolic panel   Lipid Panel w/o Chol/HDL Ratio   Hypothyroidism    Check TSH      Relevant Orders   TSH   Hypoxia    Using O2 2 Liters most of the time.  Continue present use      Insomnia    Stable, continue present medications.         Other Visit Diagnoses    Need for influenza vaccination       Relevant Orders   Flu Vaccine QUAD 36+ mos IM       Follow up plan: Return in about 6 months (around 04/13/2018).

## 2017-10-14 NOTE — Assessment & Plan Note (Signed)
Check TSH 

## 2017-10-15 ENCOUNTER — Other Ambulatory Visit: Payer: Self-pay | Admitting: Unknown Physician Specialty

## 2017-10-15 LAB — COMPREHENSIVE METABOLIC PANEL
ALT: 13 IU/L (ref 0–32)
AST: 21 IU/L (ref 0–40)
Albumin/Globulin Ratio: 1.5 (ref 1.2–2.2)
Albumin: 3.9 g/dL (ref 3.6–4.8)
Alkaline Phosphatase: 95 IU/L (ref 39–117)
BUN/Creatinine Ratio: 10 — ABNORMAL LOW (ref 12–28)
BUN: 8 mg/dL (ref 8–27)
Bilirubin Total: <0.2 mg/dL (ref 0.0–1.2)
CALCIUM: 8.8 mg/dL (ref 8.7–10.3)
CO2: 29 mmol/L (ref 20–29)
CREATININE: 0.84 mg/dL (ref 0.57–1.00)
Chloride: 100 mmol/L (ref 96–106)
GFR calc Af Amer: 87 mL/min/{1.73_m2} (ref >59)
GFR, EST NON AFRICAN AMERICAN: 75 mL/min/{1.73_m2} (ref >59)
Globulin, Total: 2.6 g/dL (ref 1.5–4.5)
Glucose: 103 mg/dL — ABNORMAL HIGH (ref 65–99)
POTASSIUM: 3.8 mmol/L (ref 3.5–5.2)
Sodium: 141 mmol/L (ref 134–144)
Total Protein: 6.5 g/dL (ref 6.0–8.5)

## 2017-10-15 LAB — TSH: TSH: 0.049 u[IU]/mL — AB (ref 0.450–4.500)

## 2017-10-15 LAB — LIPID PANEL W/O CHOL/HDL RATIO
Cholesterol, Total: 145 mg/dL (ref 100–199)
HDL: 36 mg/dL — AB (ref >39)
LDL CALC: 74 mg/dL (ref 0–99)
TRIGLYCERIDES: 176 mg/dL — AB (ref 0–149)
VLDL Cholesterol Cal: 35 mg/dL (ref 5–40)

## 2017-10-15 MED ORDER — LEVOTHYROXINE SODIUM 112 MCG PO TABS: 112.0000 ug | ORAL_TABLET | Freq: Every day | ORAL | 0 refills | Status: DC

## 2017-10-15 NOTE — Progress Notes (Signed)
Suppressed TSH.  Decrease Levothyroxine

## 2017-10-15 NOTE — Progress Notes (Signed)
Normal labs.  Pt notified through mychart

## 2017-11-04 ENCOUNTER — Other Ambulatory Visit: Payer: Self-pay | Admitting: Unknown Physician Specialty

## 2017-11-06 ENCOUNTER — Telehealth: Payer: Self-pay | Admitting: Unknown Physician Specialty

## 2017-11-06 MED ORDER — AZITHROMYCIN 250 MG PO TABS: ORAL_TABLET | ORAL | 0 refills | Status: DC

## 2017-11-06 MED ORDER — CYANOCOBALAMIN 1000 MCG/ML IJ SOLN: 1000.0000 ug | INTRAMUSCULAR | 12 refills | Status: DC

## 2017-11-06 NOTE — Telephone Encounter (Signed)
Called and spoke with patient. I let her know about her prescription being called in and I let her know that Malachy Mood wants to see her if she is not better. Patient verbalized understanding. Patient also asked if her B12 could be sent in because she gives those injections to herself.

## 2017-11-06 NOTE — Telephone Encounter (Signed)
I will give her a Zpack.  Check on Monday if no imprvement

## 2017-11-06 NOTE — Telephone Encounter (Signed)
Patient states her congestion is head and throat- she is coughing up mucus- not as bad as when in office. Nose is running,sore throat, sneezing. No fever. Patient thinks she would benefit from antibiotic.(See visit 11/7) Patient wants her meds checked and wants to get her refills.She just got her Ambien( among others) yesterday. She may be needing the B12.

## 2017-11-06 NOTE — Telephone Encounter (Signed)
Copied from [place] [id number]. Topic: Quick Communication - Rx Refill/Question >> Nov 06, 2017  9:50 AM Marin Olp L wrote: Has the patient contacted their pharmacy? No. (Agent: If no, request that the patient contact the pharmacy for the refill.) Preferred Pharmacy (with phone number or street name): [place], [place]: Please be advised that RX refills may take up to 48 hours. We ask that you follow-up with your pharmacy. Still have cough and sore throat. Cough syrup did help from before.

## 2017-11-06 NOTE — Telephone Encounter (Signed)
Routing to provider. I do not see any medications due for refill yet.

## 2017-12-03 ENCOUNTER — Other Ambulatory Visit: Payer: Self-pay | Admitting: Unknown Physician Specialty

## 2017-12-03 ENCOUNTER — Telehealth: Payer: Self-pay | Admitting: Unknown Physician Specialty

## 2017-12-03 NOTE — Telephone Encounter (Signed)
Called and spoke to a tech at the pharmacy. She states the last RX received from [place] was in October so I called in the RX written most recently in the chart. Will call patient and let her know.

## 2017-12-03 NOTE — Telephone Encounter (Signed)
I called this prescription in to the pharmacy this morning because the pharmacy did not have the current RX on file. Please see previous phone encounter and refuse this medication as it has already been taken care of.

## 2017-12-03 NOTE — Telephone Encounter (Signed)
Controlled substance 

## 2017-12-03 NOTE — Telephone Encounter (Signed)
Patient notified

## 2017-12-03 NOTE — Telephone Encounter (Signed)
Copied from [place] [id number]. Topic: Quick Communication - See Telephone Encounter >> Dec 03, 2017 10:01 AM Conception Chancy, NT wrote: CRM for notification. See Telephone encounter for:  12/03/17.  Patient has contacted pharmacy and she is also calling the office to get a refill on her Ambien.   Patient uses pharmacy on [address]

## 2017-12-15 ENCOUNTER — Other Ambulatory Visit: Payer: Self-pay | Admitting: Unknown Physician Specialty

## 2018-01-28 ENCOUNTER — Encounter: Payer: Self-pay | Admitting: Radiation Oncology

## 2018-01-28 ENCOUNTER — Other Ambulatory Visit: Payer: Self-pay

## 2018-01-28 ENCOUNTER — Ambulatory Visit
Admission: RE | Admit: 2018-01-28 | Discharge: 2018-01-28 | Disposition: A | Discharge status: Home / Self Care | Payer: Managed Care, Other (non HMO) | Source: Ambulatory Visit | Attending: Radiation Oncology | Admitting: Radiation Oncology

## 2018-01-28 VITALS — BP 120/75 | HR 83 | Temp 97.1°F | Resp 12 | Ht 63.0 in | Wt 157.2 lb

## 2018-01-28 DIAGNOSIS — Z923 Personal history of irradiation: Secondary | ICD-10-CM | POA: Diagnosis not present

## 2018-01-28 DIAGNOSIS — Z87891 Personal history of nicotine dependence: Secondary | ICD-10-CM | POA: Diagnosis not present

## 2018-01-28 DIAGNOSIS — Z9981 Dependence on supplemental oxygen: Secondary | ICD-10-CM | POA: Diagnosis not present

## 2018-01-28 DIAGNOSIS — J9809 Other diseases of bronchus, not elsewhere classified: Secondary | ICD-10-CM | POA: Diagnosis not present

## 2018-01-28 DIAGNOSIS — Z85118 Personal history of other malignant neoplasm of bronchus and lung: Secondary | ICD-10-CM | POA: Insufficient documentation

## 2018-01-28 DIAGNOSIS — Z993 Dependence on wheelchair: Secondary | ICD-10-CM | POA: Insufficient documentation

## 2018-01-28 DIAGNOSIS — C3432 Malignant neoplasm of lower lobe, left bronchus or lung: Secondary | ICD-10-CM

## 2018-01-28 NOTE — Progress Notes (Signed)
Patient here for follow up. She has complaints of being extremely tired, nerve spasms in her back and a constant cough. Occassionally she produces sputum.

## 2018-01-28 NOTE — Progress Notes (Signed)
Radiation Oncology Follow up Note  Name: Jill Gross   Date:   01/28/2018 MRN:  [id number] DOB: [birth year]    This 62 y.o. 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  REFERRING PROVIDER: [name], NP  HPI: Patient is a 62 year old female now out 1.5 years having completed both chest and whole brain radiation therapy for limited stage small cell lung cancer. She seen today in routine follow-up is doing fair. She is on nasal oxygen at and is wheelchair-bound. According to her most recent CT scan. Performed in September shows stable moderate left pleural effusion and a persistent low-attenuation fluid in the central left lower lobe bronchus with near complete collapse left upper and left lower lobes. There is mild increased aeration since previous study. She specifically denies hemoptysis has a slight productive cough. Her by mouth intake is good and weight is stable. She has had pleurocentesis in the past which was negative for malignancy. She's also had repeat bronchoscopy showing 90% stenosis of bronchus no evidence of malignancy.  COMPLICATIONS OF TREATMENT: none  FOLLOW UP COMPLIANCE: keeps appointments   PHYSICAL EXAM:  BP 120/75 (BP Location: Left Arm, Patient Position: Sitting, Cuff Size: Normal)   Pulse 83   Temp (!) 97.1 F (36.2 C)   Resp 12   Ht 5\' 3"  (1.6 m)   Wt 157 lb 3 oz (71.3 kg)   LMP  (LMP Unknown)   BMI 27.84 kg/m  Wheelchair-bound female on nasal oxygen. She does have decreased breath sounds on the left side. Well-developed well-nourished patient in NAD. HEENT reveals PERLA, EOMI, discs not visualized.  Oral cavity is clear. No oral mucosal lesions are identified. Neck is clear without evidence of cervical or supraclavicular adenopathy. Lungs are clear to A&P. Cardiac examination is essentially unremarkable with regular rate and rhythm without murmur rub  or thrill. Abdomen is benign with no organomegaly or masses noted. Motor sensory and DTR levels are equal and symmetric in the upper and lower extremities. Cranial nerves II through XII are grossly intact. Proprioception is intact. No peripheral adenopathy or edema is identified. No motor or sensory levels are noted. Crude visual fields are within normal range.  RADIOLOGY RESULTS: CT scan is reviewed and compatible with the above-stated findings  PLAN: At the present time patient is stable doing fairly well. Her P when take is good and weight is actually increasing. She has no evidence of malignancy at this time. I have asked to see her back in 6 months for follow-up. At that time will review any previous scans. She or he has follow-up care with medical oncology. Patient and husband know to call with any concerns.  I would like to take this opportunity to thank you for allowing me to participate in the care of your patient.Noreene Filbert, MD

## 2018-02-18 ENCOUNTER — Ambulatory Visit
Admission: RE | Admit: 2018-02-18 | Discharge: 2018-02-18 | Disposition: A | Discharge status: Home / Self Care | Payer: Managed Care, Other (non HMO) | Source: Ambulatory Visit | Attending: Oncology | Admitting: Oncology

## 2018-02-18 DIAGNOSIS — C3432 Malignant neoplasm of lower lobe, left bronchus or lung: Secondary | ICD-10-CM | POA: Diagnosis not present

## 2018-02-18 LAB — POCT I-STAT CREATININE: CREATININE: 0.8 mg/dL (ref 0.44–1.00)

## 2018-02-18 MED ORDER — IOPAMIDOL (ISOVUE-300) INJECTION 61%
75.0000 mL | Freq: Once | INTRAVENOUS | Status: AC | PRN
  Administered 2018-02-18: 75 mL via INTRAVENOUS

## 2018-02-21 NOTE — Progress Notes (Signed)
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  INTERVAL HISTORY: Patient returns to clinic today for further evaluation and discussion of her imaging results. She continues to have chronic shortness of breath and requires oxygen.  She also continues to have a chronic cough.  She has a fair appetite, but no further weight loss.  She also has continued neuropathy particularly in her fingertips. She has no other neurologic complaints. She denies any recent fevers. She denies any chest pain or hemoptysis. She denies any nausea, vomiting, constipation, or diarrhea. She has no urinary complaints. Patient offers no further specific complaints today.   REVIEW OF SYSTEMS:   Review of Systems  Constitutional: Positive for malaise/fatigue. Negative for fever and weight loss.  HENT: Negative for sore throat.   Eyes: Negative.   Respiratory: Positive for cough and shortness of breath. Negative for hemoptysis.   Cardiovascular: Negative.  Negative for chest pain and leg swelling.  Gastrointestinal: Positive for nausea. Negative for vomiting.  Genitourinary: Negative.   Musculoskeletal: Negative.   Skin: Negative.   Neurological: Positive for sensory change and weakness. Negative for dizziness and tingling.  Psychiatric/Behavioral: Negative.  Negative for depression. The patient is not nervous/anxious.     As per HPI. Otherwise, a complete review of systems is negative.  PAST MEDICAL HISTORY: Past Medical History:  Diagnosis Date  .  Allergy   . Anemia    pernicious  . Anxiety   . Arthritis   . Atrophic vaginitis   . Back pain   . COPD (chronic obstructive pulmonary disease) ([place])   . Depression   . Diverticulosis   . Dyspnea   . Family history of adverse reaction to anesthesia    MOM-BUT UNSURE WHAT REACTION WAS  . Fatigue   . GERD (gastroesophageal reflux disease)   . H/O degenerative disc disease   . Hashimoto's disease   . History of chemotherapy 04/2016  . Hyperlipidemia   . Hypothyroid   . Insomnia   . Low back pain   . Lumbago   . Neuropathy due to chemotherapeutic drug ([place])   . Osteopenia   . Pneumonia 11/2016  . Small cell carcinoma of left lung ([place]) 01/2016   rad + chemo tx's  . Tobacco use   . Vitamin B12 deficiency     PAST SURGICAL HISTORY: Past Surgical History:  Procedure Laterality Date  . ABDOMINAL HYSTERECTOMY    . ANKLE SURGERY Left    x4  . CARPAL TUNNEL RELEASE Right 03/2014  . CHOLECYSTECTOMY    . ENDOBRONCHIAL ULTRASOUND N/A 01/07/2016   Procedure: ENDOBRONCHIAL ULTRASOUND;  Surgeon: Vilinda Boehringer, MD;  Location: [facility] OR[facility];  Service: Cardiopulmonary;  Laterality: N/A;  . ENDOBRONCHIAL ULTRASOUND N/A 09/16/2016   Procedure: ENDOBRONCHIAL ULTRASOUND;  Surgeon: Vilinda Boehringer, MD;  Location: [facility];  Service: Cardiopulmonary;  Laterality: N/A;  . ENDOBRONCHIAL ULTRASOUND N/A 07/02/2017   Procedure: ENDOBRONCHIAL ULTRASOUND;  Surgeon: Laverle Hobby, MD;  Location: [facility];  Service: Pulmonary;  Laterality: N/A;  . HERNIA REPAIR    . PORT-A-CATH REMOVAL N/A 09/08/2017   Procedure: REMOVAL PORT-A-CATH;  Surgeon:  [name], MD;  Location: [facility];  Service: General;  Laterality: N/A;  . PORTACATH PLACEMENT Right 01/23/2016   Procedure: INSERTION PORT-A-CATH;  Surgeon: [name], MD;  Location: [facility];  Service: General;  Laterality: Right;    FAMILY HISTORY Family History  Problem Relation Age of Onset  . Emphysema Mother   . Diabetes Father         ADVANCED DIRECTIVES:    HEALTH MAINTENANCE: Social History   Tobacco Use  . Smoking status: Former Smoker    Packs/day: 1.00    Years: 30.00    Pack years: 30.00    Types: Cigarettes    Last attempt to quit: 01/11/2016    Years since quitting: 2.1  . Smokeless tobacco: Never Used  Substance Use Topics  . Alcohol use: No    Alcohol/week: 0.0 oz  . Drug use: No     Allergies  Allergen Reactions  . Macrobid [Nitrofurantoin] Anaphylaxis  . Effexor [Venlafaxine] Other (See Comments)    UNKNOWN  . Hctz [Hydrochlorothiazide] Other (See Comments)    cramps  . Neurontin [Gabapentin] Other (See Comments)    UNKNOWN   . Paxil [Paroxetine Hcl] Other (See Comments)    UNKNOWN   . Pravachol [Pravastatin Sodium] Other (See Comments)    aching  . Prozac [Fluoxetine Hcl] Other (See Comments)    UNKNOWN   . Sulfur Itching and Other (See Comments)    "blisters in mouth"  . Wellbutrin [Bupropion] Itching  . Zoloft [Sertraline Hcl] Other (See Comments)    UNKNOWN     Current Outpatient Medications  Medication Sig Dispense Refill  . amitriptyline (ELAVIL) 10 MG tablet TAKE TWO TABLETS AT BEDTIME. 60 tablet 12  . aspirin EC 81 MG tablet Take 81 mg by mouth daily.    . cyanocobalamin (,VITAMIN B-12,) 1000 MCG/ML injection Inject 1 mL (1,000 mcg total) into the muscle every 30 (thirty) days. 1 mL 12  . ipratropium-albuterol (DUONEB) 0.5-2.5 (3) MG/3ML SOLN Take 3 mLs by nebulization every 6 (six) hours as needed. 360 mL 0  . levothyroxine (SYNTHROID, LEVOTHROID) 112 MCG tablet TAKE 1 TABLET ON AN EMPTY STOMACH WITH A GLASS OF WATER AT LEAST 30 TO 60 MINUTES BEFORE BREAKFAST. 90 tablet 0  . ranitidine (ZANTAC) 150 MG tablet Take 150 mg by mouth 2 (two) times daily as needed for heartburn.    . simvastatin (ZOCOR) 10 MG tablet TAKE (1) TABLET BY MOUTH EVERY DAY AT 6PM 30 tablet 12  . zolpidem (AMBIEN) 10 MG tablet TAKE ONE TABLET BY MOUTH AT BEDTIME AS NEEDED FOR SLEEP 30 tablet 5   . acetaminophen (TYLENOL) 500 MG tablet Take 1,000 mg by mouth daily as needed for mild pain.    Marland Kitchen ALPRAZolam (XANAX) 0.25 MG tablet Take 1 tablet (0.25 mg total) by mouth 2 (two) times daily as needed for anxiety. (Patient not taking: Reported on 02/22/2018) 60 tablet 1  . oxyCODONE-acetaminophen (PERCOCET/ROXICET) 5-325 MG tablet Take 1 tablet by mouth every 6 (six) hours as needed for severe pain. 30 tablet 0  . promethazine (PHENERGAN) 25 MG tablet Take 1 tablet (25 mg total) by mouth every 6 (six) hours as needed for nausea or vomiting. 30 tablet 2   No current facility-administered medications for this visit.     OBJECTIVE: Vitals:   02/22/18 1421  BP: 136/75  Pulse: 98  Resp: 20  Temp: (!) 96.3 F (35.7 C)     Body mass index is 28.02 kg/m.  ECOG FS:1 - Symptomatic but completely ambulatory  General: No acute distress. Sitting in wheelchair Eyes: Pink conjunctiva, anicteric sclera. HEENT: Oropharynx clear without erythema or exudate. Lungs: Clear to auscultation bilaterally. Heart: Tachycardia. No rubs, murmurs, or gallops. Musculoskeletal: No edema, cyanosis, or clubbing. Neuro: Alert, answering all questions appropriately. Cranial nerves grossly intact. Skin: No rashes or petechiae noted. Poor skin turgor. Psych: Normal affect.   LAB RESULTS:  Lab Results  Component Value Date   NA 141 10/14/2017   K 3.8 10/14/2017   CL 100 10/14/2017   CO2 29 10/14/2017   GLUCOSE 103 (H) 10/14/2017   BUN 8 10/14/2017   CREATININE 0.80 02/18/2018   CALCIUM 8.8 10/14/2017   PROT 6.5 10/14/2017   ALBUMIN 3.9 10/14/2017   AST 21 10/14/2017   ALT 13 10/14/2017   ALKPHOS 95 10/14/2017   BILITOT <0.2 10/14/2017   GFRNONAA 75 10/14/2017   GFRAA 87 10/14/2017    Lab Results  Component Value Date   WBC 8.0 09/04/2017   NEUTROABS 5.9 09/04/2017   HGB 12.3 09/04/2017   HCT 36.1 09/04/2017   MCV 90.8 09/04/2017   PLT 277 09/04/2017     STUDIES: Ct Chest W  Contrast  Result Date: 02/18/2018 CLINICAL DATA:  Primary lung cancer.  Follow-up. EXAM: CT CHEST WITH CONTRAST TECHNIQUE: Multidetector CT imaging of the chest was performed during intravenous contrast administration. CONTRAST:  1mL ISOVUE-300 IOPAMIDOL (ISOVUE-300) INJECTION 61% COMPARISON:  08/11/2017 FINDINGS: Cardiovascular: Normal heart size. There is a moderate size pericardial effusion which is increased in volume from previous exam. Aortic atherosclerosis noted. Calcification in the LAD and RCA coronary artery noted. Mediastinum/Nodes: The trachea appears patent and is midline. The esophagus appears normal. No mediastinal adenopathy identified. Right hilar lymph node Measures 9 mm, image 53/2. Unchanged from previous exam. Lungs/Pleura: Moderate to large left pleural effusion is identified. This appears partially loculated and is similar in volume to the previous exam. Volume loss, fibrosis and masslike architectural distortion within the paramediastinal left lung is again noted. Partial atelectasis of the left lower lobe is unchanged. The appearance is stable from the prior exam. Upper Abdomen: No acute abnormality. Musculoskeletal: No chest wall abnormality. No acute or significant osseous findings. IMPRESSION: 1. Stable appearance of large partially loculated left pleural effusion and chronic changes secondary to external beam radiation within the left lung. 2. Increase in volume of pericardial effusion. 3. Aortic Atherosclerosis (ICD10-I70.0) and Emphysema (ICD10-J43.9). 4. LAD and RCA coronary artery calcifications. Electronically Signed   By: Kerby Moors M.D.   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  PLAN:    1. 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  Previously, bronchoscopy and FNA results on September 16, 2016 were negative for malignancy. Pleural fluid from thoracentesis on March 25, 2017 was also negative for recurrence. Repeat bronchoscopy on July 02, 2017 revealed a 90% stenosis of her bronchus, but multiple biopsies revealed no evidence of malignancy. No intervention is needed at this time. Return to clinic in 6 months with repeat imaging and further evaluation.  2. Pulmonary embolism: Likely secondary to increased sedentary lifestyle. Patient has now taken 6 months of Eliquis and has been instructed to discontinue treatment.  3. Peripheral neuropathy: Patient is no longer taking gabapentin.  4. Anemia: Patient's hemoglobin remains decreased, but stable. Monitor. 5. Thyroid: Continue current dose of 125 g Synthroid. Will defer to primary care for adjustment of medications.  6. Pain: Patient was given a refill of her Percocet today. 7. Anxiety:  Continue Xanax as needed. 8.  Nausea: Continue Phenergan as needed.   Patient expressed understanding and was in agreement with this plan. She also understands that She can call clinic at any time with any questions, concerns, or complaints.    Lloyd Huger, MD   02/22/2018 4:38 PM

## 2018-02-22 ENCOUNTER — Other Ambulatory Visit: Payer: Self-pay

## 2018-02-22 ENCOUNTER — Inpatient Hospital Stay: Payer: Managed Care, Other (non HMO) | Attending: Oncology | Admitting: Oncology

## 2018-02-22 VITALS — BP 136/75 | HR 98 | Temp 96.3°F | Resp 20 | Wt 158.2 lb

## 2018-02-22 DIAGNOSIS — Z90710 Acquired absence of both cervix and uterus: Secondary | ICD-10-CM | POA: Diagnosis not present

## 2018-02-22 DIAGNOSIS — Z87891 Personal history of nicotine dependence: Secondary | ICD-10-CM | POA: Diagnosis not present

## 2018-02-22 DIAGNOSIS — J449 Chronic obstructive pulmonary disease, unspecified: Secondary | ICD-10-CM | POA: Insufficient documentation

## 2018-02-22 DIAGNOSIS — Z9889 Other specified postprocedural states: Secondary | ICD-10-CM | POA: Insufficient documentation

## 2018-02-22 DIAGNOSIS — K219 Gastro-esophageal reflux disease without esophagitis: Secondary | ICD-10-CM | POA: Diagnosis not present

## 2018-02-22 DIAGNOSIS — Z881 Allergy status to other antibiotic agents status: Secondary | ICD-10-CM | POA: Diagnosis not present

## 2018-02-22 DIAGNOSIS — R110 Nausea: Secondary | ICD-10-CM | POA: Insufficient documentation

## 2018-02-22 DIAGNOSIS — G629 Polyneuropathy, unspecified: Secondary | ICD-10-CM | POA: Diagnosis not present

## 2018-02-22 DIAGNOSIS — C3432 Malignant neoplasm of lower lobe, left bronchus or lung: Secondary | ICD-10-CM

## 2018-02-22 DIAGNOSIS — C3402 Malignant neoplasm of left main bronchus: Secondary | ICD-10-CM

## 2018-02-22 DIAGNOSIS — Z888 Allergy status to other drugs, medicaments and biological substances status: Secondary | ICD-10-CM | POA: Diagnosis not present

## 2018-02-22 DIAGNOSIS — Z79899 Other long term (current) drug therapy: Secondary | ICD-10-CM | POA: Diagnosis not present

## 2018-02-22 DIAGNOSIS — Z9049 Acquired absence of other specified parts of digestive tract: Secondary | ICD-10-CM | POA: Insufficient documentation

## 2018-02-22 DIAGNOSIS — I2699 Other pulmonary embolism without acute cor pulmonale: Secondary | ICD-10-CM | POA: Diagnosis not present

## 2018-02-22 DIAGNOSIS — D649 Anemia, unspecified: Secondary | ICD-10-CM | POA: Insufficient documentation

## 2018-02-22 DIAGNOSIS — F419 Anxiety disorder, unspecified: Secondary | ICD-10-CM

## 2018-02-22 DIAGNOSIS — Z7982 Long term (current) use of aspirin: Secondary | ICD-10-CM | POA: Diagnosis not present

## 2018-02-22 MED ORDER — PROMETHAZINE HCL 25 MG PO TABS: 25.0000 mg | ORAL_TABLET | Freq: Four times a day (QID) | ORAL | 2 refills | Status: DC | PRN

## 2018-02-22 MED ORDER — OXYCODONE-ACETAMINOPHEN 5-325 MG PO TABS: 1.0000 | ORAL_TABLET | Freq: Four times a day (QID) | ORAL | 0 refills | Status: DC | PRN

## 2018-02-22 NOTE — Progress Notes (Signed)
Here for follow up " doing good " stated has cough that" hits in spells " -no blood coughed up per pt- has" creamy/white phlegm d/c " she stated.  o2 @ 2 L today

## 2018-04-13 ENCOUNTER — Ambulatory Visit (INDEPENDENT_AMBULATORY_CARE_PROVIDER_SITE_OTHER): Payer: Managed Care, Other (non HMO) | Admitting: Unknown Physician Specialty

## 2018-04-13 ENCOUNTER — Encounter: Payer: Self-pay | Admitting: Unknown Physician Specialty

## 2018-04-13 VITALS — BP 105/70 | HR 102 | Temp 97.6°F | Ht 63.0 in | Wt 156.0 lb

## 2018-04-13 DIAGNOSIS — M545 Low back pain: Secondary | ICD-10-CM

## 2018-04-13 DIAGNOSIS — R059 Cough, unspecified: Secondary | ICD-10-CM

## 2018-04-13 DIAGNOSIS — R05 Cough: Secondary | ICD-10-CM

## 2018-04-13 DIAGNOSIS — G6289 Other specified polyneuropathies: Secondary | ICD-10-CM | POA: Diagnosis not present

## 2018-04-13 DIAGNOSIS — R8290 Unspecified abnormal findings in urine: Secondary | ICD-10-CM | POA: Diagnosis not present

## 2018-04-13 DIAGNOSIS — E039 Hypothyroidism, unspecified: Secondary | ICD-10-CM | POA: Diagnosis not present

## 2018-04-13 DIAGNOSIS — G629 Polyneuropathy, unspecified: Secondary | ICD-10-CM | POA: Insufficient documentation

## 2018-04-13 DIAGNOSIS — I10 Essential (primary) hypertension: Secondary | ICD-10-CM | POA: Diagnosis not present

## 2018-04-13 DIAGNOSIS — G4700 Insomnia, unspecified: Secondary | ICD-10-CM

## 2018-04-13 MED ORDER — ALBUTEROL SULFATE HFA 108 (90 BASE) MCG/ACT IN AERS: 2.0000 | INHALATION_SPRAY | Freq: Four times a day (QID) | RESPIRATORY_TRACT | 2 refills | Status: AC | PRN

## 2018-04-13 MED ORDER — HYDROCOD POLST-CPM POLST ER 10-8 MG/5ML PO SUER: 5.0000 mL | Freq: Two times a day (BID) | ORAL | 0 refills | Status: DC | PRN

## 2018-04-13 NOTE — Progress Notes (Signed)
BP 105/70   Pulse (!) 102   Temp 97.6 F (36.4 C) (Oral)   Ht 5\' 3"  (1.6 m)   Wt 156 lb (70.8 kg)   LMP  (LMP Unknown)   SpO2 98%   BMI 27.63 kg/m    Subjective:    Patient ID: Jill Gross, female    DOB: [birth year], 62 y.o.   MRN: [id number]  HPI: Jill Gross is a 62 y.o. female  Chief Complaint  Patient presents with  . Hyperlipidemia  . Hypertension  . Hypothyroidism    Back pain Bilateral pain.  Describes it as a pressure pain.  Has a knot near her spin.  Pt has a significant history of back pain prior to treatment for lung cancer.  States feet get like ice and then they get hot.  Was told it was neuropathy in the past  Hypothyroid Taking thyroid medication.  Hair is growing but falling out.  She has ained some weight  Cough Persistent cough.  States she is coughing up a cream color.  No blood.  Takes Delsum.  Has no inhaler  Insomnia Pt takes Ambien nightly.  States if she does not take, she doesn't sleep at all.    Relevant past medical, surgical, family and social history reviewed and updated as indicated. Interim medical history since our last visit reviewed. Allergies and medications reviewed and updated.  Review of Systems  Genitourinary:       Reports foul smelling urine    Per HPI unless specifically indicated above     Objective:    BP 105/70   Pulse (!) 102   Temp 97.6 F (36.4 C) (Oral)   Ht 5\' 3"  (1.6 m)   Wt 156 lb (70.8 kg)   LMP  (LMP Unknown)   SpO2 98%   BMI 27.63 kg/m   Wt Readings from Last 3 Encounters:  04/13/18 156 lb (70.8 kg)  02/22/18 158 lb 3.2 oz (71.8 kg)  01/28/18 157 lb 3 oz (71.3 kg)    Physical Exam  Constitutional: She is oriented to person, place, and time. She appears well-developed and well-nourished.  HENT:  Head: Normocephalic and atraumatic.  Eyes: Pupils are equal, round, and reactive to light. Right eye exhibits no discharge. Left eye exhibits no discharge. No scleral icterus.  Neck: Normal range  of motion. Neck supple. Carotid bruit is not present. No thyromegaly present.  Cardiovascular: Normal rate, regular rhythm and normal heart sounds. Exam reveals no gallop and no friction rub.  No murmur heard. Pulmonary/Chest: Effort normal and breath sounds normal. No respiratory distress. She has no wheezes. She has no rales. No breast tenderness or discharge.  Abdominal: Soft. Bowel sounds are normal. There is no tenderness. There is no rebound.  Genitourinary: No breast tenderness or discharge.  Musculoskeletal: Normal range of motion.  Lymphadenopathy:    She has no cervical adenopathy.  Neurological: She is alert and oriented to person, place, and time.  Skin: Skin is warm, dry and intact. No rash noted.  Psychiatric: She has a normal mood and affect. Her speech is normal and behavior is normal. Judgment and thought content normal. Cognition and memory are normal.    Results for orders placed or performed during the hospital encounter of 02/18/18  I-STAT creatinine  Result Value Ref Range   Creatinine, Ser 0.80 0.44 - 1.00 mg/dL      Assessment & Plan:   Problem List Items Addressed This Visit  Unprioritized   Cough    Ok for one rx of Tussionex.  Rx for Albuterol to help with cough      Hypertension - Primary   Relevant Orders   Comprehensive metabolic panel   Lipid Panel w/o Chol/HDL Ratio   Hypothyroidism   Relevant Orders   TSH   T4   Insomnia    Taking Ambien nightly.  Has tried to wean off and unable.  Encouraged to try a behvior modification program      Low back pain    Went to pain clinic at one time.  Does not want to go again.  PT won't work with her      Peripheral neuropathy    Continue Amitriptyline 10 mg BID.  This seems to be slowly improving       Other Visit Diagnoses    Foul smelling urine       positive urine.  Send for culture   Relevant Orders   UA/M w/rflx Culture, Routine       Follow up plan: Return in about 6 months  (around 10/14/2018).

## 2018-04-13 NOTE — Assessment & Plan Note (Addendum)
Went to pain clinic at one time.  Does not want to go again.  PT won't work with her

## 2018-04-13 NOTE — Assessment & Plan Note (Signed)
Taking Ambien nightly.  Has tried to wean off and unable.  Encouraged to try a behvior modification program

## 2018-04-13 NOTE — Assessment & Plan Note (Signed)
[place] for one rx of Tussionex.  Rx for Albuterol to help with cough

## 2018-04-13 NOTE — Assessment & Plan Note (Signed)
Continue Amitriptyline 10 mg BID.  This seems to be slowly improving

## 2018-04-14 LAB — COMPREHENSIVE METABOLIC PANEL
A/G RATIO: 1.3 (ref 1.2–2.2)
ALK PHOS: 123 IU/L — AB (ref 39–117)
ALT: 18 IU/L (ref 0–32)
AST: 22 IU/L (ref 0–40)
Albumin: 4.2 g/dL (ref 3.6–4.8)
BUN/Creatinine Ratio: 8 — ABNORMAL LOW (ref 12–28)
BUN: 6 mg/dL — ABNORMAL LOW (ref 8–27)
Bilirubin Total: 0.2 mg/dL (ref 0.0–1.2)
CALCIUM: 9.5 mg/dL (ref 8.7–10.3)
CO2: 24 mmol/L (ref 20–29)
Chloride: 95 mmol/L — ABNORMAL LOW (ref 96–106)
Creatinine, Ser: 0.75 mg/dL (ref 0.57–1.00)
GFR calc Af Amer: 99 mL/min/{1.73_m2} (ref >59)
GFR, EST NON AFRICAN AMERICAN: 86 mL/min/{1.73_m2} (ref >59)
Globulin, Total: 3.2 g/dL (ref 1.5–4.5)
Glucose: 89 mg/dL (ref 65–99)
POTASSIUM: 4.2 mmol/L (ref 3.5–5.2)
Sodium: 137 mmol/L (ref 134–144)
Total Protein: 7.4 g/dL (ref 6.0–8.5)

## 2018-04-14 LAB — LIPID PANEL W/O CHOL/HDL RATIO
CHOLESTEROL TOTAL: 155 mg/dL (ref 100–199)
HDL: 31 mg/dL — AB (ref >39)
LDL Calculated: 79 mg/dL (ref 0–99)
TRIGLYCERIDES: 224 mg/dL — AB (ref 0–149)
VLDL Cholesterol Cal: 45 mg/dL — ABNORMAL HIGH (ref 5–40)

## 2018-04-14 LAB — T4: T4 TOTAL: 11.6 ug/dL (ref 4.5–12.0)

## 2018-04-14 LAB — TSH: TSH: 1.17 u[IU]/mL (ref 0.450–4.500)

## 2018-04-14 NOTE — Progress Notes (Signed)
Notified pt by mychart

## 2018-04-15 LAB — UA/M W/RFLX CULTURE, ROUTINE
Bilirubin, UA: NEGATIVE
Glucose, UA: NEGATIVE
Ketones, UA: NEGATIVE
NITRITE UA: POSITIVE — AB
Protein, UA: NEGATIVE
Specific Gravity, UA: <1.005 — ABNORMAL LOW (ref 1.005–1.030)
Urobilinogen, Ur: 0.2 mg/dL (ref 0.2–1.0)
pH, UA: 6 (ref 5.0–7.5)

## 2018-04-15 LAB — URINE CULTURE, REFLEX

## 2018-04-15 LAB — MICROSCOPIC EXAMINATION

## 2018-04-21 ENCOUNTER — Other Ambulatory Visit: Payer: Self-pay | Admitting: Unknown Physician Specialty

## 2018-04-21 DIAGNOSIS — R350 Frequency of micturition: Secondary | ICD-10-CM

## 2018-04-23 ENCOUNTER — Other Ambulatory Visit: Payer: Self-pay | Admitting: Unknown Physician Specialty

## 2018-04-27 ENCOUNTER — Other Ambulatory Visit: Payer: Managed Care, Other (non HMO)

## 2018-04-27 DIAGNOSIS — R350 Frequency of micturition: Secondary | ICD-10-CM

## 2018-04-30 ENCOUNTER — Other Ambulatory Visit: Payer: Self-pay | Admitting: Unknown Physician Specialty

## 2018-04-30 LAB — UA/M W/RFLX CULTURE, ROUTINE
BILIRUBIN UA: NEGATIVE
KETONES UA: NEGATIVE
NITRITE UA: POSITIVE — AB
SPEC GRAV UA: 1.02 (ref 1.005–1.030)
Urobilinogen, Ur: 1 mg/dL (ref 0.2–1.0)
pH, UA: 5.5 (ref 5.0–7.5)

## 2018-04-30 LAB — MICROSCOPIC EXAMINATION

## 2018-04-30 LAB — URINE CULTURE, REFLEX

## 2018-04-30 MED ORDER — AMOXICILLIN-POT CLAVULANATE 875-125 MG PO TABS: 1.0000 | ORAL_TABLET | Freq: Two times a day (BID) | ORAL | 0 refills | Status: DC

## 2018-04-30 NOTE — Progress Notes (Signed)
Please call pt and let her know I am sending in Augmentin for her UTI.

## 2018-04-30 NOTE — Progress Notes (Signed)
Normal labs.  Pt notified through mychart.  Start Macrobid

## 2018-05-25 ENCOUNTER — Other Ambulatory Visit: Payer: Self-pay | Admitting: Unknown Physician Specialty

## 2018-05-27 NOTE — Telephone Encounter (Signed)
amitriptyline refill Last Refill:05/22/17 # 60 12 RF Last OV: 04/13/18 PCP: [name] NP Pharmacy:[facility]  Zolpidem tartrate refill Last Refill:11/04/17 # 30 tab 5 RF Last OV: 04/13/18

## 2018-07-22 ENCOUNTER — Other Ambulatory Visit: Payer: Self-pay | Admitting: Unknown Physician Specialty

## 2018-08-05 ENCOUNTER — Other Ambulatory Visit: Payer: Self-pay

## 2018-08-05 ENCOUNTER — Encounter: Payer: Self-pay | Admitting: Radiation Oncology

## 2018-08-05 ENCOUNTER — Ambulatory Visit
Admission: RE | Admit: 2018-08-05 | Discharge: 2018-08-05 | Disposition: A | Discharge status: Home / Self Care | Payer: Managed Care, Other (non HMO) | Source: Ambulatory Visit | Attending: Radiation Oncology | Admitting: Radiation Oncology

## 2018-08-05 VITALS — BP 120/81 | HR 112 | Resp 18 | Wt 149.6 lb

## 2018-08-05 DIAGNOSIS — Z85118 Personal history of other malignant neoplasm of bronchus and lung: Secondary | ICD-10-CM | POA: Insufficient documentation

## 2018-08-05 DIAGNOSIS — I313 Pericardial effusion (noninflammatory): Secondary | ICD-10-CM | POA: Insufficient documentation

## 2018-08-05 DIAGNOSIS — Z87891 Personal history of nicotine dependence: Secondary | ICD-10-CM | POA: Insufficient documentation

## 2018-08-05 DIAGNOSIS — J90 Pleural effusion, not elsewhere classified: Secondary | ICD-10-CM | POA: Diagnosis not present

## 2018-08-05 DIAGNOSIS — Z9981 Dependence on supplemental oxygen: Secondary | ICD-10-CM | POA: Insufficient documentation

## 2018-08-05 DIAGNOSIS — C3432 Malignant neoplasm of lower lobe, left bronchus or lung: Secondary | ICD-10-CM

## 2018-08-05 NOTE — Progress Notes (Signed)
Radiation Oncology Follow up Note  Name: Jill Gross   Date:   08/05/2018 MRN:  [id number] DOB: [birth year]    This 62 y.o. female presents to the clinic today for over 2 year follow-up status post concurrent chemoradiation therapy for small cell lung cancer as well as PCI.  REFERRING PROVIDER: [name], NP  HPI: patient is a 62 year old female now seen out over 2 years having completed concurrent chemoradiotherapy for limited stage small cell lung cancer. She is seen today in routine follow-up is doing well. She's currently on nasal oxygen. She is also a wheelchair now she's been having some balance issues..her most recent CT scan which I have reviewed shows stable appearance of large partially loculated left pleural effusion and changes secondary to external beam radiation therapy within the left lung. She also has a pericardial effusion. She specifically denies cough hemoptysis or chest tightness. She's having no headaches change in mental status or focal neurologic deficits.  COMPLICATIONS OF TREATMENT: none  FOLLOW UP COMPLIANCE: keeps appointments   PHYSICAL EXAM:  BP 120/81 (BP Location: Left Arm, Patient Position: Sitting)   Pulse (!) 112   Resp 18   Wt 149 lb 9.3 oz (67.8 kg)   LMP  (LMP Unknown)   BMI 26.50 kg/m  Well-developed female wheelchair-bound on nasal oxygen in NAD. Does have some decreased breath sounds in the left lower lobe. Well-developed well-nourished patient in NAD. HEENT reveals PERLA, EOMI, discs not visualized.  Oral cavity is clear. No oral mucosal lesions are identified. Neck is clear without evidence of cervical or supraclavicular adenopathy. Lungs are clear to A&P. Cardiac examination is essentially unremarkable with regular rate and rhythm without murmur rub or thrill. Abdomen is benign with no organomegaly or masses noted. Motor sensory and DTR levels are equal and symmetric in the upper and lower extremities. Cranial nerves II through XII are grossly  intact. Proprioception is intact. No peripheral adenopathy or edema is identified. No motor or sensory levels are noted. Crude visual fields are within normal range.  RADIOLOGY RESULTS: CT scans reviewed and compatible with the above-stated findings  PLAN: present time patient is doing well. Bronchoscopy in July 2018 showed 90% stenosis of her bronchus but all biopsies negative. She continues to do well with no evidence of disease. I'm please were overall progress. I've asked to see her back in 1 year for follow-up. She continues close follow-up care with medical oncology. Patient knows to call at anytime with any concerns.  I would like to take this opportunity to thank you for allowing me to participate in the care of your patient.Noreene Filbert, MD

## 2018-08-17 ENCOUNTER — Telehealth: Payer: Self-pay | Admitting: Unknown Physician Specialty

## 2018-08-17 MED ORDER — NYSTATIN 100000 UNIT/ML MT SUSP: 5.0000 mL | Freq: Four times a day (QID) | OROMUCOSAL | 0 refills | Status: DC

## 2018-08-17 NOTE — Telephone Encounter (Signed)
Copied from [place] [id number]. Topic: General - Other >> Aug 17, 2018 11:02 AM [name] wrote:  Reason for CRM: pt unable to come in to office . Pt has thrush in her mouth on the tongue also. Pt has seen cheryl in past for this. Pt is now cancer free. Pt having trouble walking and her husband can not get off work. [facility]

## 2018-08-17 NOTE — Telephone Encounter (Signed)
Sent in mouthwash, will need appt if not improving

## 2018-08-18 NOTE — Telephone Encounter (Signed)
Patient notified

## 2018-08-22 NOTE — Progress Notes (Deleted)
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  INTERVAL HISTORY: Patient returns to clinic today for further evaluation and discussion of her imaging results. She continues to have chronic shortness of breath and requires oxygen.  She also continues to have a chronic cough.  She has a fair appetite, but no further weight loss.  She also has continued neuropathy particularly in her fingertips. She has no other neurologic complaints. She denies any recent fevers. She denies any chest pain or hemoptysis. She denies any nausea, vomiting, constipation, or diarrhea. She has no urinary complaints. Patient offers no further specific complaints today.   REVIEW OF SYSTEMS:   Review of Systems  Constitutional: Positive for malaise/fatigue. Negative for fever and weight loss.  HENT: Negative for sore throat.   Eyes: Negative.   Respiratory: Positive for cough and shortness of breath. Negative for hemoptysis.   Cardiovascular: Negative.  Negative for chest pain and leg swelling.  Gastrointestinal: Positive for nausea. Negative for vomiting.  Genitourinary: Negative.   Musculoskeletal: Negative.   Skin: Negative.   Neurological: Positive for sensory change and weakness. Negative for dizziness and tingling.  Psychiatric/Behavioral: Negative.  Negative for depression. The patient is not nervous/anxious.     As per HPI. Otherwise, a complete review of systems is negative.  PAST MEDICAL HISTORY: Past Medical History:  Diagnosis  Date  . Allergy   . Anemia    pernicious  . Anxiety   . Arthritis   . Atrophic vaginitis   . Back pain   . COPD (chronic obstructive pulmonary disease) ([place])   . Depression   . Diverticulosis   . Dyspnea   . Family history of adverse reaction to anesthesia    MOM-BUT UNSURE WHAT REACTION WAS  . Fatigue   . GERD (gastroesophageal reflux disease)   . H/O degenerative disc disease   . Hashimoto's disease   . History of chemotherapy 04/2016  . Hyperlipidemia   . Hypothyroid   . Insomnia   . Low back pain   . Lumbago   . Neuropathy due to chemotherapeutic drug ([place])   . Osteopenia   . Pneumonia 11/2016  . Small cell carcinoma of left lung ([place]) 01/2016   rad + chemo tx's  . Tobacco use   . Vitamin B12 deficiency     PAST SURGICAL HISTORY: Past Surgical History:  Procedure Laterality Date  . ABDOMINAL HYSTERECTOMY    . ANKLE SURGERY Left    x4  . CARPAL TUNNEL RELEASE Right 03/2014  . CHOLECYSTECTOMY    . ENDOBRONCHIAL ULTRASOUND N/A 01/07/2016   Procedure: ENDOBRONCHIAL ULTRASOUND;  Surgeon: Vilinda Boehringer, MD;  Location: [facility];  Service: Cardiopulmonary;  Laterality: N/A;  . ENDOBRONCHIAL ULTRASOUND N/A 09/16/2016   Procedure: ENDOBRONCHIAL ULTRASOUND;  Surgeon: Vilinda Boehringer, MD;  Location: [facility];  Service: Cardiopulmonary;  Laterality: N/A;  . ENDOBRONCHIAL ULTRASOUND N/A 07/02/2017   Procedure: ENDOBRONCHIAL ULTRASOUND;  Surgeon: Laverle Hobby, MD;  Location: [facility];  Service: Pulmonary;  Laterality: N/A;  . HERNIA REPAIR    . PORT-A-CATH REMOVAL N/A 09/08/2017   Procedure: REMOVAL PORT-A-CATH;  Surgeon: [name], MD;  Location: [facility];  Service: General;  Laterality: N/A;  . PORTACATH PLACEMENT Right 01/23/2016   Procedure: INSERTION PORT-A-CATH;  Surgeon: [name], MD;  Location: [facility];  Service: General;  Laterality: Right;    FAMILY HISTORY Family History  Problem Relation Age of Onset  . Emphysema Mother   . Diabetes Father         ADVANCED DIRECTIVES:    HEALTH MAINTENANCE: Social History   Tobacco Use  . Smoking status: Former Smoker    Packs/day: 1.00    Years: 30.00    Pack years: 30.00    Types: Cigarettes    Last attempt to quit: 01/11/2016    Years since quitting: 2.6  . Smokeless tobacco: Never Used  Substance Use Topics  . Alcohol use: No    Alcohol/week: 0.0 standard drinks  . Drug use: No     Allergies  Allergen Reactions  . Macrobid [Nitrofurantoin] Anaphylaxis  . Effexor [Venlafaxine] Other (See Comments)    UNKNOWN  . Hctz [Hydrochlorothiazide] Other (See Comments)    cramps  . Neurontin [Gabapentin] Other (See Comments)    UNKNOWN   . Paxil [Paroxetine Hcl] Other (See Comments)    UNKNOWN   . Pravachol [Pravastatin Sodium] Other (See Comments)    aching  . Prozac [Fluoxetine Hcl] Other (See Comments)    UNKNOWN   . Sulfur Itching and Other (See Comments)    "blisters in mouth"  . Wellbutrin [Bupropion] Itching  . Zoloft [Sertraline Hcl] Other (See Comments)    UNKNOWN     Current Outpatient Medications  Medication Sig Dispense Refill  . acetaminophen (TYLENOL) 500 MG tablet Take 1,000 mg by mouth daily as needed for mild pain.    Marland Kitchen albuterol (PROVENTIL HFA;VENTOLIN HFA) 108 (90 Base) MCG/ACT inhaler Inhale 2 puffs into the lungs every 6 (six) hours as needed for wheezing or shortness of breath. 1 Inhaler 2  . ALPRAZolam (XANAX) 0.25 MG tablet Take 1 tablet (0.25 mg total) by mouth 2 (two) times daily as needed for anxiety. (Patient not taking: Reported on 02/22/2018) 60 tablet 1  . amitriptyline (ELAVIL) 10 MG tablet TAKE 2 TABLETS BY MOUTH AT BEDTIME 60 tablet 12  . aspirin EC 81 MG tablet Take 81 mg by mouth daily.    . cyanocobalamin (,VITAMIN B-12,) 1000 MCG/ML injection Inject 1 mL (1,000 mcg total) into the muscle every 30 (thirty) days. 1 mL 12  . ipratropium-albuterol (DUONEB) 0.5-2.5 (3) MG/3ML SOLN Take 3 mLs by nebulization every 6 (six) hours as needed. 360  mL 0  . levothyroxine (SYNTHROID, LEVOTHROID) 112 MCG tablet TAKE 1 TABLET EVERY MORNING 30 TO 60 MINUTES BEFORE BREAKFAST WITH A FULL GLASS OF WATER 90 tablet 0  . nystatin (MYCOSTATIN) 100000 UNIT/ML suspension Take 5 mLs (500,000 Units total) by mouth 4 (four) times daily. 60 mL 0  . promethazine (PHENERGAN) 25 MG tablet Take 1 tablet (25 mg total) by mouth every 6 (six) hours as needed for nausea or vomiting. 30 tablet 2  . ranitidine (ZANTAC) 150 MG tablet Take 150 mg by mouth 2 (two) times daily as needed for heartburn.    . simvastatin (ZOCOR) 10 MG tablet TAKE (1) TABLET BY MOUTH EVERY DAY AT 6:00 IN THE EVENING 30 tablet 12  . zolpidem (AMBIEN) 10 MG tablet TAKE (1) TABLET BY MOUTH DAILY AT BEDTIME AS NEEDED FOR SLEEP 30 tablet 5   No current facility-administered medications for this visit.  OBJECTIVE: There were no vitals filed for this visit.   There is no height or weight on file to calculate BMI.    ECOG FS:1 - Symptomatic but completely ambulatory  General: No acute distress. Sitting in wheelchair Eyes: Pink conjunctiva, anicteric sclera. HEENT: Oropharynx clear without erythema or exudate. Lungs: Clear to auscultation bilaterally. Heart: Tachycardia. No rubs, murmurs, or gallops. Musculoskeletal: No edema, cyanosis, or clubbing. Neuro: Alert, answering all questions appropriately. Cranial nerves grossly intact. Skin: No rashes or petechiae noted. Poor skin turgor. Psych: Normal affect.   LAB RESULTS:  Lab Results  Component Value Date   NA 137 04/13/2018   K 4.2 04/13/2018   CL 95 (L) 04/13/2018   CO2 24 04/13/2018   GLUCOSE 89 04/13/2018   BUN 6 (L) 04/13/2018   CREATININE 0.75 04/13/2018   CALCIUM 9.5 04/13/2018   PROT 7.4 04/13/2018   ALBUMIN 4.2 04/13/2018   AST 22 04/13/2018   ALT 18 04/13/2018   ALKPHOS 123 (H) 04/13/2018   BILITOT 0.2 04/13/2018   GFRNONAA 86 04/13/2018   GFRAA 99 04/13/2018    Lab Results  Component Value Date   WBC 8.0  09/04/2017   NEUTROABS 5.9 09/04/2017   HGB 12.3 09/04/2017   HCT 36.1 09/04/2017   MCV 90.8 09/04/2017   PLT 277 09/04/2017     STUDIES: No results found.  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  PLAN:    1. 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 Previously, bronchoscopy and FNA results on September 16, 2016 were negative for malignancy. Pleural fluid from thoracentesis on March 25, 2017 was also negative for recurrence. Repeat bronchoscopy on July 02, 2017 revealed a 90% stenosis of her bronchus, but multiple biopsies revealed no evidence of malignancy. No intervention is needed at this time. Return to clinic in 6 months with repeat imaging and further evaluation.  2. Pulmonary embolism: Likely secondary to increased sedentary lifestyle. Patient has now taken 6 months of Eliquis and has been instructed to discontinue treatment.  3. Peripheral neuropathy: Patient is no longer taking gabapentin. 4. Anemia: Patient's hemoglobin remains decreased, but stable. Monitor. 5. Thyroid: Continue current dose of 125 g Synthroid. Will defer to primary care for adjustment of medications.  6. Pain: Patient was given a refill of her Percocet today. 7. Anxiety:  Continue Xanax as needed. 8.  Nausea: Continue Phenergan as needed.   Patient expressed understanding and was in agreement with this plan. She also understands that She can call clinic at any time with any questions, concerns, or complaints.    Lloyd Huger, MD   08/22/2018 3:57 PM

## 2018-08-25 ENCOUNTER — Ambulatory Visit
Admission: RE | Admit: 2018-08-25 | Discharge: 2018-08-25 | Disposition: A | Discharge status: Home / Self Care | Payer: Managed Care, Other (non HMO) | Source: Ambulatory Visit | Attending: Oncology | Admitting: Oncology

## 2018-08-25 ENCOUNTER — Encounter: Payer: Self-pay | Admitting: Oncology

## 2018-08-25 ENCOUNTER — Inpatient Hospital Stay: Payer: Managed Care, Other (non HMO) | Attending: Oncology | Admitting: Oncology

## 2018-08-25 VITALS — BP 112/74 | HR 112 | Temp 97.4°F | Resp 18

## 2018-08-25 DIAGNOSIS — E063 Autoimmune thyroiditis: Secondary | ICD-10-CM

## 2018-08-25 DIAGNOSIS — E785 Hyperlipidemia, unspecified: Secondary | ICD-10-CM | POA: Diagnosis not present

## 2018-08-25 DIAGNOSIS — K219 Gastro-esophageal reflux disease without esophagitis: Secondary | ICD-10-CM | POA: Diagnosis not present

## 2018-08-25 DIAGNOSIS — C3432 Malignant neoplasm of lower lobe, left bronchus or lung: Secondary | ICD-10-CM | POA: Diagnosis present

## 2018-08-25 DIAGNOSIS — I700 Atherosclerosis of aorta: Secondary | ICD-10-CM | POA: Diagnosis not present

## 2018-08-25 DIAGNOSIS — Z79899 Other long term (current) drug therapy: Secondary | ICD-10-CM | POA: Diagnosis not present

## 2018-08-25 DIAGNOSIS — G4700 Insomnia, unspecified: Secondary | ICD-10-CM | POA: Diagnosis not present

## 2018-08-25 DIAGNOSIS — G629 Polyneuropathy, unspecified: Secondary | ICD-10-CM

## 2018-08-25 DIAGNOSIS — R531 Weakness: Secondary | ICD-10-CM

## 2018-08-25 DIAGNOSIS — D649 Anemia, unspecified: Secondary | ICD-10-CM

## 2018-08-25 DIAGNOSIS — J9819 Other pulmonary collapse: Secondary | ICD-10-CM | POA: Diagnosis not present

## 2018-08-25 DIAGNOSIS — F419 Anxiety disorder, unspecified: Secondary | ICD-10-CM | POA: Diagnosis not present

## 2018-08-25 DIAGNOSIS — R590 Localized enlarged lymph nodes: Secondary | ICD-10-CM | POA: Insufficient documentation

## 2018-08-25 DIAGNOSIS — M8580 Other specified disorders of bone density and structure, unspecified site: Secondary | ICD-10-CM

## 2018-08-25 DIAGNOSIS — R5383 Other fatigue: Secondary | ICD-10-CM

## 2018-08-25 DIAGNOSIS — Z7982 Long term (current) use of aspirin: Secondary | ICD-10-CM

## 2018-08-25 DIAGNOSIS — J90 Pleural effusion, not elsewhere classified: Secondary | ICD-10-CM | POA: Diagnosis not present

## 2018-08-25 DIAGNOSIS — J449 Chronic obstructive pulmonary disease, unspecified: Secondary | ICD-10-CM | POA: Diagnosis not present

## 2018-08-25 DIAGNOSIS — Z86711 Personal history of pulmonary embolism: Secondary | ICD-10-CM | POA: Diagnosis not present

## 2018-08-25 DIAGNOSIS — Z87891 Personal history of nicotine dependence: Secondary | ICD-10-CM | POA: Diagnosis not present

## 2018-08-25 DIAGNOSIS — C3490 Malignant neoplasm of unspecified part of unspecified bronchus or lung: Secondary | ICD-10-CM

## 2018-08-25 LAB — POCT I-STAT CREATININE: CREATININE: 0.8 mg/dL (ref 0.44–1.00)

## 2018-08-25 MED ORDER — IOHEXOL 300 MG/ML  SOLN
75.0000 mL | Freq: Once | INTRAMUSCULAR | Status: AC | PRN
  Administered 2018-08-25: 75 mL via INTRAVENOUS

## 2018-08-25 NOTE — Progress Notes (Signed)
Patient here today for follow up regarding CT results.

## 2018-08-25 NOTE — Progress Notes (Signed)
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  INTERVAL HISTORY: Patient returns to clinic today as an acute add-on after routine restaging CT scan revealed a completely obstructed left bronchus and complete collapse of left lung.  Despite this, her chronic shortness of breath has remained unchanged.  She does not complain of increased cough, hemoptysis, or chest pain.  She continues to require oxygen.  She has no neurologic complaints.  She denies any recent fevers or illnesses.  She has a fair appetite and denies weight loss.  She denies any nausea, vomiting, constipation, or diarrhea. She has no urinary complaints.  Patient feels at her baseline offers no further specific complaints today.  REVIEW OF SYSTEMS:   Review of Systems  Constitutional: Positive for malaise/fatigue. Negative for fever and weight loss.  HENT: Negative for sore throat.   Eyes: Negative.   Respiratory: Positive for shortness of breath. Negative for cough and hemoptysis.   Cardiovascular: Negative.  Negative for chest pain and leg swelling.  Gastrointestinal: Negative for nausea and vomiting.  Genitourinary: Negative.   Musculoskeletal: Negative.   Skin: Negative.   Neurological: Positive for sensory change and weakness. Negative for dizziness and tingling.  Psychiatric/Behavioral: Negative.  Negative for depression. The patient is not nervous/anxious.     As per HPI. Otherwise, a complete review of systems is negative.  PAST MEDICAL  HISTORY: Past Medical History:  Diagnosis Date  . Allergy   . Anemia    pernicious  . Anxiety   . Arthritis   . Atrophic vaginitis   . Back pain   . COPD (chronic obstructive pulmonary disease) ([place])   . Depression   . Diverticulosis   . Dyspnea   . Family history of adverse reaction to anesthesia    MOM-BUT UNSURE WHAT REACTION WAS  . Fatigue   . GERD (gastroesophageal reflux disease)   . H/O degenerative disc disease   . Hashimoto's disease   . History of chemotherapy 04/2016  . Hyperlipidemia   . Hypothyroid   . Insomnia   . Low back pain   . Lumbago   . Neuropathy due to chemotherapeutic drug ([place])   . Osteopenia   . Pneumonia 11/2016  . Small cell carcinoma of left lung ([place]) 01/2016   rad + chemo tx's  . Tobacco use   . Vitamin B12 deficiency     PAST SURGICAL HISTORY: Past Surgical History:  Procedure Laterality Date  . ABDOMINAL HYSTERECTOMY    . ANKLE SURGERY Left    x4  . CARPAL TUNNEL RELEASE Right 03/2014  . CHOLECYSTECTOMY    . ENDOBRONCHIAL ULTRASOUND N/A 01/07/2016   Procedure: ENDOBRONCHIAL ULTRASOUND;  Surgeon: Vilinda Boehringer, MD;  Location: [facility];  Service: Cardiopulmonary;  Laterality: N/A;  . ENDOBRONCHIAL ULTRASOUND N/A 09/16/2016   Procedure: ENDOBRONCHIAL ULTRASOUND;  Surgeon: Vilinda Boehringer, MD;  Location: [facility];  Service: Cardiopulmonary;  Laterality: N/A;  . ENDOBRONCHIAL ULTRASOUND N/A 07/02/2017   Procedure: ENDOBRONCHIAL ULTRASOUND;  Surgeon: Laverle Hobby, MD;  Location: [facility];  Service: Pulmonary;  Laterality: N/A;  . HERNIA REPAIR    .  PORT-A-CATH REMOVAL N/A 09/08/2017   Procedure: REMOVAL PORT-A-CATH;  Surgeon: [name], MD;  Location: [facility];  Service: General;  Laterality: N/A;  . PORTACATH PLACEMENT Right 01/23/2016   Procedure: INSERTION PORT-A-CATH;  Surgeon: [name], MD;  Location: [facility];  Service: General;  Laterality: Right;    FAMILY HISTORY Family History  Problem Relation Age of Onset  .  Emphysema Mother   . Diabetes Father        ADVANCED DIRECTIVES:    HEALTH MAINTENANCE: Social History   Tobacco Use  . Smoking status: Former Smoker    Packs/day: 1.00    Years: 30.00    Pack years: 30.00    Types: Cigarettes    Last attempt to quit: 01/11/2016    Years since quitting: 2.6  . Smokeless tobacco: Never Used  Substance Use Topics  . Alcohol use: No    Alcohol/week: 0.0 standard drinks  . Drug use: No     Allergies  Allergen Reactions  . Macrobid [Nitrofurantoin] Anaphylaxis  . Effexor [Venlafaxine] Other (See Comments)    UNKNOWN  . Hctz [Hydrochlorothiazide] Other (See Comments)    cramps  . Neurontin [Gabapentin] Other (See Comments)    UNKNOWN   . Paxil [Paroxetine Hcl] Other (See Comments)    UNKNOWN   . Pravachol [Pravastatin Sodium] Other (See Comments)    aching  . Prozac [Fluoxetine Hcl] Other (See Comments)    UNKNOWN   . Sulfur Itching and Other (See Comments)    "blisters in mouth"  . Wellbutrin [Bupropion] Itching  . Zoloft [Sertraline Hcl] Other (See Comments)    UNKNOWN     Current Outpatient Medications  Medication Sig Dispense Refill  . acetaminophen (TYLENOL) 500 MG tablet Take 1,000 mg by mouth daily as needed for mild pain.    Marland Kitchen albuterol (PROVENTIL HFA;VENTOLIN HFA) 108 (90 Base) MCG/ACT inhaler Inhale 2 puffs into the lungs every 6 (six) hours as needed for wheezing or shortness of breath. 1 Inhaler 2  . ALPRAZolam (XANAX) 0.25 MG tablet Take 1 tablet (0.25 mg total) by mouth 2 (two) times daily as needed for anxiety. 60 tablet 1  . amitriptyline (ELAVIL) 10 MG tablet TAKE 2 TABLETS BY MOUTH AT BEDTIME 60 tablet 12  . aspirin EC 81 MG tablet Take 81 mg by mouth daily.    . cyanocobalamin (,VITAMIN B-12,) 1000 MCG/ML injection Inject 1 mL (1,000 mcg total) into the muscle every 30 (thirty) days. 1 mL 12  . ipratropium-albuterol (DUONEB) 0.5-2.5 (3) MG/3ML SOLN Take 3 mLs by nebulization every 6 (six) hours as needed. 360 mL 0   . levothyroxine (SYNTHROID, LEVOTHROID) 112 MCG tablet TAKE 1 TABLET EVERY MORNING 30 TO 60 MINUTES BEFORE BREAKFAST WITH A FULL GLASS OF WATER 90 tablet 0  . nystatin (MYCOSTATIN) 100000 UNIT/ML suspension Take 5 mLs (500,000 Units total) by mouth 4 (four) times daily. 60 mL 0  . promethazine (PHENERGAN) 25 MG tablet Take 1 tablet (25 mg total) by mouth every 6 (six) hours as needed for nausea or vomiting. 30 tablet 2  . ranitidine (ZANTAC) 150 MG tablet Take 150 mg by mouth 2 (two) times daily as needed for heartburn.    . simvastatin (ZOCOR) 10 MG tablet TAKE (1) TABLET BY MOUTH EVERY DAY AT 6:00 IN THE EVENING 30 tablet 12  . zolpidem (AMBIEN) 10 MG tablet TAKE (1) TABLET BY MOUTH DAILY AT BEDTIME AS NEEDED FOR SLEEP 30 tablet 5   No current facility-administered medications for this visit.  OBJECTIVE: Vitals:   08/25/18 1127  BP: 112/74  Pulse: (!) 112  Resp: 18  Temp: (!) 97.4 F (36.3 C)  SpO2: 99%     There is no height or weight on file to calculate BMI.    ECOG FS:1 - Symptomatic but completely ambulatory  General: Well-developed, well-nourished, no acute distress.  Sitting in a wheelchair. Eyes: Pink conjunctiva, anicteric sclera. HEENT: Normocephalic, moist mucous membranes. Lungs: Clear to auscultation right, no breath sounds on left.   Heart: Regular rate and rhythm. No rubs, murmurs, or gallops. Abdomen: Soft, nontender, nondistended. No organomegaly noted, normoactive bowel sounds. Musculoskeletal: No edema, cyanosis, or clubbing. Neuro: Alert, answering all questions appropriately. Cranial nerves grossly intact. Skin: No rashes or petechiae noted. Psych: Normal affect.  LAB RESULTS:  Lab Results  Component Value Date   NA 137 04/13/2018   K 4.2 04/13/2018   CL 95 (L) 04/13/2018   CO2 24 04/13/2018   GLUCOSE 89 04/13/2018   BUN 6 (L) 04/13/2018   CREATININE 0.80 08/25/2018   CALCIUM 9.5 04/13/2018   PROT 7.4 04/13/2018   ALBUMIN 4.2 04/13/2018   AST  22 04/13/2018   ALT 18 04/13/2018   ALKPHOS 123 (H) 04/13/2018   BILITOT 0.2 04/13/2018   GFRNONAA 86 04/13/2018   GFRAA 99 04/13/2018    Lab Results  Component Value Date   WBC 8.0 09/04/2017   NEUTROABS 5.9 09/04/2017   HGB 12.3 09/04/2017   HCT 36.1 09/04/2017   MCV 90.8 09/04/2017   PLT 277 09/04/2017     STUDIES: Ct Chest W Contrast  Result Date: 08/25/2018 CLINICAL DATA:  Small-cell lung cancer. EXAM: CT CHEST WITH CONTRAST TECHNIQUE: Multidetector CT imaging of the chest was performed during intravenous contrast administration. CONTRAST:  59mL OMNIPAQUE IOHEXOL 300 MG/ML  SOLN COMPARISON:  02/18/2018 FINDINGS: Cardiovascular: The heart size is normal. Small to moderate pericardial effusion is similar. Coronary artery calcification is evident. Atherosclerotic calcification is noted in the wall of the thoracic aorta. Mediastinum/Nodes: No mediastinal lymphadenopathy. The 9 mm right hilar lymph node is stable. Abnormal soft tissue in the left hilum is similar to prior. The esophagus has normal imaging features. There is no axillary lymphadenopathy. Lungs/Pleura: Volume loss left hemithorax. No suspicious nodule or mass in the right lung. Left mainstem bronchus is occluded by fluid, debris, or tumor. There is complete collapse of the left upper and lower lobes. Left pleural effusion fills the left hemithorax. Upper Abdomen: Unremarkable. Musculoskeletal: No worrisome lytic or sclerotic osseous abnormality. IMPRESSION: 1. Left mainstem bronchus is occluded and there is complete collapse of the left upper and lower lobes. Associated volume loss left hemithorax. 2. Large left pleural effusion. 3. Stable right hilar lymph node. 4.  Aortic Atherosclerois (ICD10-170.0) Electronically Signed   By: Misty Stanley M.D.   On: 08/25/2018 10:26    ASSESSMENT: Clinical stage IIa, T2 N1 M0 small cell lung cancer of the bronchus of left lower lobe.  PLAN:    1. Clinical stage IIa, T2 N1 M0 small cell  lung cancer of the bronchus of left lower lobe: CT scan results from August 25, 2018 reviewed independently and reported as above with left mainstem bronchus occlusion and complete collapse of left lung.  Previously, bronchoscopy and FNA results on September 16, 2016 were negative for malignancy. Pleural fluid from thoracentesis on March 25, 2017 was also negative for recurrence. Repeat bronchoscopy on July 02, 2017 revealed a 90% stenosis of her bronchus, but multiple biopsies revealed no evidence of  malignancy.  Have sent an urgent referral to pulmonology for consideration of repeat bronchoscopy and biopsies to assess whether this is recurrent disease or not.  Patient will return to clinic 1 week after her bronchoscopy to discuss the results. 2.  History of pulmonary embolism: Likely secondary to increased sedentary lifestyle.  Patient completed 6 months of Eliquis.   3. Peripheral neuropathy: Patient does not complain of this today.  Patient is no longer taking gabapentin. 4. Anemia: Patient's hemoglobin remains decreased, but stable. Monitor. 5. Thyroid: Continue current dose of 125 g Synthroid. Will defer to primary care for adjustment of medications.  6. Pain: Patient does not complain of pain today.  Continue Percocet as needed. 7. Anxiety:  Continue Xanax as needed. 8.  Nausea: Continue Phenergan as needed.  I spent a total of 30 minutes face-to-face with the patient of which greater than 50% of the visit was spent in counseling and coordination of care as detailed above.    Patient expressed understanding and was in agreement with this plan. She also understands that She can call clinic at any time with any questions, concerns, or complaints.    Lloyd Huger, MD   08/25/2018 1:50 PM

## 2018-08-26 ENCOUNTER — Inpatient Hospital Stay: Payer: Managed Care, Other (non HMO) | Admitting: Oncology

## 2018-08-31 ENCOUNTER — Encounter: Payer: Self-pay | Admitting: Internal Medicine

## 2018-08-31 ENCOUNTER — Ambulatory Visit (INDEPENDENT_AMBULATORY_CARE_PROVIDER_SITE_OTHER): Payer: Managed Care, Other (non HMO) | Admitting: Internal Medicine

## 2018-08-31 VITALS — BP 100/60 | HR 121 | Resp 16 | Ht 63.0 in | Wt 146.0 lb

## 2018-08-31 DIAGNOSIS — C3432 Malignant neoplasm of lower lobe, left bronchus or lung: Secondary | ICD-10-CM | POA: Diagnosis not present

## 2018-08-31 DIAGNOSIS — J90 Pleural effusion, not elsewhere classified: Secondary | ICD-10-CM | POA: Diagnosis not present

## 2018-08-31 NOTE — Progress Notes (Signed)
* [facility]     Assessment and Plan:  Left lung small cell lung cancer. -Progressive left lung atelectasis, uncertain if this represents recurrence of malignancy versus worsening pleural effusion. - If upcoming thoracentesis unrevealing, may consider repeating bronchoscopy versus repeat thoracentesis.  Left lung atelectasis, secondary to enlarging pleural effusion. -Worsening left lung atelectasis secondary to enlarging pleural effusion - We will plan for thoracentesis, repeat chest x-ray to see if lung reexpands.  We will also send for cytology to look for evidence of recurrence of cancer.  History of pulmonary embolism --In December 2017, completed 6 months of anticoagulation.   Orders Placed This Encounter  Procedures  . US THORACENTESIS ASP PLEURAL SPACE W/IMG GUIDE  . DG Chest 2 View   Return in about 1 week (around 09/07/2018) for thoracentesis followup.   Date: 08/31/2018  MRN# [id number] Jill Gross 21-Mar-1956   Jill Gross is a 62 y.o. old female seen in follow up for chief complaint of  Chief Complaint  Patient presents with  . Follow-up    pt referred by for malignant neoplasm by  Dr.Finnegan.  Marland Kitchen Shortness of Breath    pt on 2 liters 24/7//with exertion  . Cough    both productive ( creamy color) and non productive     HPI:  The patient is a 62 year old female with lung cancer, clinical stage IIa, T2 N1 M0, limited stage small cell lung cancer of the bronchus of left lower lobe/left hilar mass diagnosed on bronchoscopy on 01/07/2016.  She underwent radiation chemotherapy with apparent good response.  She had persistent left lower lobe atelectasis, therefore underwent bronchoscopy on 09/16/2016 which was negative for recurrence.  Her left lower lobe atelectasis persisted on subsequent scans, there remained concern about recurrence, she therefore underwent repeat EBUS bronchoscopy on 07/02/2017;  at that time it was noted that she had 90%  stenosis of the left lower lobe bronchus, biopsies were negative for recurrence, she has subsequently had her Port-A-Cath removed on 09/08/2017.  The patient subsequently underwent a restaging which again showed a left lower lobe atelectasis, which is now progressed to complete left lung atelectasis.  In comparison with previous scan it appears that the patient's pleural effusion seen on the previous scan has no enlarged with resulting compression of the complete left lung.  Currently she feels that she is doing well, her breathing is at baseline. She is wearing 2L oxygen.    She is diagnosed with pulmonary embolism in December 2017, subsequently treated with anti-coagulation for 6 months.  **CT chest 08/25/2018; imaging personally reviewed, there continues to be atelectasis of much of the left lung.  In comparison with previous CT scan on 02/18/2018, there is now complete atelectasis of the left lung, as opposed to previously where there was atelectasis of the left lower lobe but the lingula remained open.  Comparison with previous it looks like the pleural effusion/pericardial effusion is now larger which further compressed of the lung including the left upper lobe. **CT chest 05/13/17. There is a complete atelectasis of the left lung, the left bronchus intermedius appears to end blindly, likely due to external compression. There appears to be a lung mass, distally and laterally, there is a moderate pleural effusion, with leftward mediastinal shift.  She had a left thoracentesis on 03/25/17; thoracentesis negative for malignancy.     Medication:    Current Outpatient Medications:  .  acetaminophen (TYLENOL) 500 MG tablet, Take 1,000 mg by mouth daily as needed for  mild pain., Disp: , Rfl:  .  albuterol (PROVENTIL HFA;VENTOLIN HFA) 108 (90 Base) MCG/ACT inhaler, Inhale 2 puffs into the lungs every 6 (six) hours as needed for wheezing or shortness of breath., Disp: 1 Inhaler, Rfl: 2 .  ALPRAZolam  (XANAX) 0.25 MG tablet, Take 1 tablet (0.25 mg total) by mouth 2 (two) times daily as needed for anxiety., Disp: 60 tablet, Rfl: 1 .  amitriptyline (ELAVIL) 10 MG tablet, TAKE 2 TABLETS BY MOUTH AT BEDTIME, Disp: 60 tablet, Rfl: 12 .  aspirin EC 81 MG tablet, Take 81 mg by mouth daily., Disp: , Rfl:  .  cyanocobalamin (,VITAMIN B-12,) 1000 MCG/ML injection, Inject 1 mL (1,000 mcg total) into the muscle every 30 (thirty) days., Disp: 1 mL, Rfl: 12 .  ipratropium-albuterol (DUONEB) 0.5-2.5 (3) MG/3ML SOLN, Take 3 mLs by nebulization every 6 (six) hours as needed., Disp: 360 mL, Rfl: 0 .  levothyroxine (SYNTHROID, LEVOTHROID) 112 MCG tablet, TAKE 1 TABLET EVERY MORNING 30 TO 60 MINUTES BEFORE BREAKFAST WITH A FULL GLASS OF WATER, Disp: 90 tablet, Rfl: 0 .  nystatin (MYCOSTATIN) 100000 UNIT/ML suspension, Take 5 mLs (500,000 Units total) by mouth 4 (four) times daily., Disp: 60 mL, Rfl: 0 .  promethazine (PHENERGAN) 25 MG tablet, Take 1 tablet (25 mg total) by mouth every 6 (six) hours as needed for nausea or vomiting., Disp: 30 tablet, Rfl: 2 .  ranitidine (ZANTAC) 150 MG tablet, Take 150 mg by mouth 2 (two) times daily as needed for heartburn., Disp: , Rfl:  .  simvastatin (ZOCOR) 10 MG tablet, TAKE (1) TABLET BY MOUTH EVERY DAY AT 6:00 IN THE EVENING, Disp: 30 tablet, Rfl: 12 .  zolpidem (AMBIEN) 10 MG tablet, TAKE (1) TABLET BY MOUTH DAILY AT BEDTIME AS NEEDED FOR SLEEP, Disp: 30 tablet, Rfl: 5   Allergies:  Macrobid [nitrofurantoin]; Effexor [venlafaxine]; Hctz [hydrochlorothiazide]; Neurontin [gabapentin]; Paxil [paroxetine hcl]; Pravachol [pravastatin sodium]; Prozac [fluoxetine hcl]; Sulfur; Wellbutrin [bupropion]; and Zoloft [sertraline hcl]  Review of Systems:  Constitutional: Feels well. Cardiovascular: Denies chest pain, exertional chest pain.  Pulmonary: Denies hemoptysis, pleuritic chest pain.   The remainder of systems were reviewed and were found to be negative other than what is  documented in the HPI.    Physical Examination:   VS: BP 100/60 (BP Location: Left Arm, Cuff Size: Normal)   Pulse (!) 121   Resp 16   Ht 5\' 3"  (1.6 m)   Wt 146 lb (66.2 kg)   LMP  (LMP Unknown)   SpO2 97%   BMI 25.86 kg/m   General Appearance: No distress  Neuro:without focal findings, mental status, speech normal, alert and oriented HEENT: PERRLA, EOM intact Pulmonary: No wheezing, No rales decreased air entry left lung, with dullness to percussion. CardiovascularNormal S1,S2.  No m/r/g.  Abdomen: Benign, Soft, non-tender, No masses Renal:  No costovertebral tenderness  GU:  No performed at this time. Endoc: No evident thyromegaly, no signs of acromegaly or Cushing features Skin:   warm, no rashes, no ecchymosis  Extremities: normal, no cyanosis, clubbing.     LABORATORY PANEL:   CBC No results for input(s): WBC, HGB, HCT, PLT in the last 168 hours. ------------------------------------------------------------------------------------------------------------------  Chemistries  Recent Labs  Lab 08/25/18 1003  CREATININE 0.80   ------------------------------------------------------------------------------------------------------------------  Cardiac Enzymes No results for input(s): TROPONINI in the last 168 hours. ------------------------------------------------------------  RADIOLOGY:    Results for orders placed during the hospital encounter of 11/25/16  DG Chest 2 View   Narrative CLINICAL  DATA:  Shortness of breath.  EXAM: CHEST  2 VIEW  COMPARISON:  Radiographs of October 31, 2016.  FINDINGS: Stable cardiomediastinal silhouette. Atherosclerosis of thoracic aorta is noted. Right internal jugular Port-A-Cath is stable in position. Right lung is clear but hyperexpanded. No pneumothorax is noted. Stable left perihilar and basilar opacities are noted which may represent edema, pneumonia or scarring.  IMPRESSION: Stable left lung opacities as  described above. Aortic atherosclerosis.   Electronically Signed   By: Marijo Conception, M.D.   On: 11/25/2016 10:24    ------------------------------------------------------------------------------------------------------------------  Thank  you for allowing [facility] to assist in the care of your patient. Our recommendations are noted above.  Please contact us if we can be of further service.  Marda Stalker, M.D., F.C.C.P.  Board Certified in Internal Medicine, Pulmonary Medicine, [place], and Sleep Medicine.  [facility] Office Number: [phone]   08/31/2018

## 2018-08-31 NOTE — Discharge Instructions (Signed)
Thoracentesis, Care After Refer to this sheet in the next few weeks. These instructions provide you with information about caring for yourself after your procedure. Your health care provider may also give you more specific instructions. Your treatment has been planned according to current medical practices, but problems sometimes occur. Call your health care provider if you have any problems or questions after your procedure. What can I expect after the procedure? After your procedure, it is common to have pain at the puncture site. Follow these instructions at home:  Take medicines only as directed by your health care provider.  You may return to your normal diet and normal activities as directed by your health care provider.  Drink enough fluid to keep your urine clear or pale yellow.  Do not take baths, swim, or use a hot tub until your health care provider approves.  Follow your health care provider's instructions about: ? Puncture site care. ? Bandage (dressing) changes and removal.  Check your puncture site every day for signs of infection. Watch for: ? Redness, swelling, or pain. ? Fluid, blood, or pus.  Keep all follow-up visits as directed by your health care provider. This is important. Contact a health care provider if:  You have redness, swelling, or pain at your puncture site.  You have fluid, blood, or pus coming from your puncture site.  You have a fever.  You have chills.  You have nausea or vomiting.  You have trouble breathing.  You develop a worsening cough. Get help right away if:  You have extreme shortness of breath.  You develop chest pain.  You faint or feel light-headed. This information is not intended to replace advice given to you by your health care provider. Make sure you discuss any questions you have with your health care provider. Document Released: 12/15/2014 Document Revised: 07/26/2016 Document Reviewed: 09/05/2014 Elsevier  Interactive Patient Education  [employer].

## 2018-08-31 NOTE — Patient Instructions (Signed)
Will plan for thoracentesis. Follow up 1 week later.

## 2018-09-01 ENCOUNTER — Ambulatory Visit
Admission: RE | Admit: 2018-09-01 | Discharge: 2018-09-01 | Disposition: A | Discharge status: Home / Self Care | Payer: Managed Care, Other (non HMO) | Source: Ambulatory Visit | Attending: Internal Medicine | Admitting: Internal Medicine

## 2018-09-01 ENCOUNTER — Telehealth: Payer: Self-pay | Admitting: Internal Medicine

## 2018-09-01 NOTE — Telephone Encounter (Signed)
Consulted Dr. Juanell Fairly as the Thoracentesis scheduled for today at 11:00 would not be done with mild sedation as per order request. Verified with Marcie Bal in central scheduling this was true.  Per Marcie Bal, no sedation would be provided.    Dr. Juanell Fairly stated to contact patient and advise of the above. If patient wanted to proceed to let appointment stand, however, let her be aware. If pt wanted mild sedation that he would be able to do the thora in SDS where mild sedation could be given.  Pt has been advised and will discuss with husband and return my call. Rhonda J Cobb

## 2018-09-01 NOTE — Addendum Note (Signed)
Addended by: Laverle Hobby on: 09/01/2018 11:55 AM   Modules accepted: Orders

## 2018-09-01 NOTE — Addendum Note (Signed)
Addended by: Laverle Hobby on: 09/01/2018 11:32 AM   Modules accepted: Orders, SmartSet

## 2018-09-03 ENCOUNTER — Ambulatory Visit
Admission: RE | Admit: 2018-09-03 | Discharge: 2018-09-03 | Disposition: A | Discharge status: Home / Self Care | Payer: Managed Care, Other (non HMO) | Source: Ambulatory Visit | Attending: Internal Medicine | Admitting: Internal Medicine

## 2018-09-03 DIAGNOSIS — Z9889 Other specified postprocedural states: Secondary | ICD-10-CM | POA: Diagnosis not present

## 2018-09-03 DIAGNOSIS — J90 Pleural effusion, not elsewhere classified: Secondary | ICD-10-CM | POA: Insufficient documentation

## 2018-09-03 LAB — BODY FLUID CELL COUNT WITH DIFFERENTIAL
Eos, Fluid: 0 %
Lymphs, Fluid: 77 %
Monocyte-Macrophage-Serous Fluid: 16 %
NEUTROPHIL FLUID: 7 %
OTHER CELLS FL: 0 %
Total Nucleated Cell Count, Fluid: 636 cu mm

## 2018-09-03 LAB — PROTEIN, PLEURAL OR PERITONEAL FLUID: TOTAL PROTEIN, FLUID: 4.8 g/dL

## 2018-09-03 LAB — AMYLASE, PLEURAL OR PERITONEAL FLUID: Amylase, Fluid: 48 U/L

## 2018-09-03 LAB — ALBUMIN, PLEURAL OR PERITONEAL FLUID: Albumin, Fluid: 2.7 g/dL

## 2018-09-03 MED ORDER — FENTANYL CITRATE (PF) 100 MCG/2ML IJ SOLN
INTRAMUSCULAR | Status: AC
  Administered 2018-09-03: 50 ug via INTRAVENOUS
  Filled 2018-09-03: qty 2

## 2018-09-03 MED ORDER — MIDAZOLAM HCL 2 MG/2ML IJ SOLN
4.0000 mg | Freq: Once | INTRAMUSCULAR | Status: AC
  Administered 2018-09-03 (×2): 1 mg via INTRAVENOUS

## 2018-09-03 MED ORDER — MIDAZOLAM HCL 2 MG/2ML IJ SOLN
INTRAMUSCULAR | Status: AC
  Administered 2018-09-03: 1 mg via INTRAVENOUS
  Filled 2018-09-03: qty 2

## 2018-09-03 MED ORDER — FENTANYL CITRATE (PF) 100 MCG/2ML IJ SOLN
100.0000 ug | Freq: Once | INTRAMUSCULAR | Status: AC
  Administered 2018-09-03: 50 ug via INTRAVENOUS
  Administered 2018-09-03 (×2): 25 ug via INTRAVENOUS

## 2018-09-03 MED ORDER — LACTATED RINGERS IV SOLN
INTRAVENOUS | Status: DC
  Administered 2018-09-03: 13:00:00 via INTRAVENOUS

## 2018-09-03 MED ORDER — LIDOCAINE HCL (PF) 1 % IJ SOLN
INTRAMUSCULAR | Status: AC
  Filled 2018-09-03: qty 10

## 2018-09-03 NOTE — Procedures (Signed)
PROCEDURE NOTE: Left THORACENTESIS with ULTRASOUND GUIDANCE with CONSCIOUS SEDATION.     Date: 09/03/2018,MRN# [id number] Jill Gross   Indication: Pleural effusion Conscious sedation was performed using 100 mcg of fentanyl and 4 mg of Versed.  I was present for the duration of conscious sedation of 25 minutes.  A time-out was completed verifying correct patient, procedure, site, positioning, and implant(s) or special equipment if applicable.  Ultrasound guidance was used and appropriate fluid pocket was identified and marked.  Chest ultrasound was performed scanning the left lung from the base to the apex, a large pocket of pleural effusion was found on the left hemithorax.   Patient was positioned, prepped and draped in usual sterile fashion. 1 % Lidocaine was used to anesthetize the area.   Using the safety centesis kit, the introducer trocar was passed over the catheter, into the pleural space after anesthetizing the area with 1% lidocaine.  After pleural fluid was aspirated into the syringe, the catheter was advanced while the needle was removed.  50 cc was immediately drained into a syringe.  This was then hooked up to a negative pressure bottle, and a further 300 cc was drained.  At this point the patient had significant left-sided chest pain and discomfort, which could not be controlled further with IV medications.  Therefore the procedure was stopped.  A chest xray was ordered to evaluate for pneumothorax.  Total Fluid Removed: 350 cc Color of Fluid: Serous straw-colored fluid Procedure stopped due to patient intolerance despite IV pain medication and sedation.    Marda Stalker, M.D., F.C.C.P.  Board Certified in Internal Medicine, Pulmonary Medicine, [place], and Sleep Medicine.  [facility] Office Number: [phone]

## 2018-09-03 NOTE — Discharge Instructions (Signed)
AMBULATORY SURGERY  DISCHARGE INSTRUCTIONS   1) The drugs that you were given will stay in your system until tomorrow so for the next 24 hours you should not:  A) Drive an automobile B) Make any legal decisions C) Drink any alcoholic beverage   2) You may resume regular meals tomorrow.  Today it is better to start with liquids and gradually work up to solid foods.  You may eat anything you prefer, but it is better to start with liquids, then soup and crackers, and gradually work up to solid foods.   3) Please notify your doctor immediately if you have any unusual bleeding, trouble breathing, redness and pain at the surgery site, drainage, fever, or pain not relieved by medication.    4) Additional Instructions:  No shower until tomorrow afternoon.  No heavy lifting, tylenol as needed for pain        Please contact your physician with any problems or Same Day Surgery at [phone], Monday through Friday 6 am to 4 pm, or [place] at [facility] number at [phone].

## 2018-09-03 NOTE — OR Nursing (Signed)
Dr Ashby Dawes notified of chest xray results, and pt stable with normal vs, pt to be discharged home

## 2018-09-03 NOTE — OR Nursing (Signed)
Pt here for Thorocentesis. Time out  For procedure at 1324,  Local injected per MD at 1334.  Procedure complete at 1345.  Pt tolerated well. See MAR for sedation meds and flowsheet for VS.  Chest xray obtained.

## 2018-09-04 LAB — ACID FAST SMEAR (AFB, MYCOBACTERIA): Acid Fast Smear: NEGATIVE

## 2018-09-04 LAB — ACID FAST SMEAR (AFB)

## 2018-09-04 LAB — LD, BODY FLUID (OTHER): LD, BODY FLUID: 84 IU/L

## 2018-09-04 LAB — TRIGLYCERIDES, BODY FLUIDS: Triglycerides, Fluid: 29 mg/dL

## 2018-09-06 ENCOUNTER — Telehealth: Payer: Self-pay | Admitting: Internal Medicine

## 2018-09-06 LAB — COMP PANEL: LEUKEMIA/LYMPHOMA

## 2018-09-06 NOTE — Telephone Encounter (Signed)
Called and spoke to patient's husband (per DPR) and let him know that per Dr. Ashby Dawes, they do not need another x-ray before tomorrow's apt. Husband aware with no further questions at this time.

## 2018-09-06 NOTE — Telephone Encounter (Signed)
Patient spouse calling stating patient had a Xray done on Friday not sure if patient will need another for her appointment tomorrow   Please call back

## 2018-09-06 NOTE — Progress Notes (Signed)
* [facility]     Assessment and Plan:  Left lung small cell lung cancer. -Progressive left lung atelectasis, uncertain if this represents recurrence of malignancy. - If upcoming thoracentesis unrevealing, may consider repeating bronchoscopy versus repeat thoracentesis.  Left lung atelectasis, secondary to enlarging pleural effusion. -Reoccurring left pleural effusion, exudative, appears predominantly lymphocytic.  Thoracentesis x2- for malignancy, flow cytometry, microbiology, AFB testing all negative. --S/p re-bronch x2 since initial diagnosis, negative for cancer.  - Discussed with patient, she may benefit from left pleural biopsy to rule out recurrence of cancer as well as placement of indwelling pleural catheter for drainage, and may help with her left lung atelectasis.  Will refer to thoracic surgery for left VATS pleural biopsy to r/o cancer as cause of recurrent pleural effusion and for placement of left pleurX catheter.   History of pulmonary embolism --In December 2017, completed 6 months of anticoagulation.    Return in about 3 months (around 12/08/2018).   Date: 09/06/2018  MRN# [id number] Jill Gross 10-Sep-1956   Jill Gross is a 63 y.o. old female seen in follow up for chief complaint of  Chief Complaint  Patient presents with  . Acute Visit    f/u thoracentesis... patient says she feels stable.     HPI:  The patient is a 62 year old female with lung cancer, clinical stage IIa, T2 N1 M0, limited stage small cell lung cancer of the bronchus of left lower lobe/left hilar mass diagnosed on bronchoscopy on 01/07/2016.  She underwent radiation chemotherapy with apparent good response.  She had persistent left lower lobe atelectasis, therefore underwent bronchoscopy on 09/16/2016 which was negative for recurrence.  Her left lower lobe atelectasis persisted on subsequent scans, there remained concern about recurrence, she therefore underwent repeat EBUS  bronchoscopy on 07/02/2017;  at that time it was noted that she had 90% stenosis of the left lower lobe bronchus, biopsies were negative for recurrence, she has subsequently had her Port-A-Cath removed on 09/08/2017.   The patient subsequently underwent a restaging chest CT on 08/25/2018 which again showed a left lower lobe atelectasis, which had progressed to complete left lung atelectasis.  In comparison with previous scan it appears that the patient's pleural effusion seen on the previous scan  enlarged with resulting compression of the complete left lung. She had previously a left thoracentesis on 03/25/17; thoracentesis negative for malignancy. She underwent a repeat left thoracentesis on 09/03/18 with drainage of 300 cc under sedation. She had significant pain during drainage despite IV pain medications.   Today she notes that her breathing is better since the drainage, she remains on 2L since the thoracentesis.    She is diagnosed with pulmonary embolism in December 2017, subsequently treated with anti-coagulation for 6 months.  **CT chest 08/25/2018; imaging personally reviewed, there continues to be atelectasis of much of the left lung.  In comparison with previous CT scan on 02/18/2018, there is now complete atelectasis of the left lung, as opposed to previously where there was atelectasis of the left lower lobe but the lingula remained open.  Comparison with previous it looks like the pleural effusion/pericardial effusion is now larger which further compressed of the lung including the left upper lobe. **CT chest 05/13/17. There is a complete atelectasis of the left lung, the left bronchus intermedius appears to end blindly, likely due to external compression. There appears to be a lung mass, distally and laterally, there is a moderate pleural effusion, with leftward mediastinal shift.  She  had a left thoracentesis on 03/25/17; thoracentesis negative for malignancy.     Medication:    Current  Outpatient Medications:  .  acetaminophen (TYLENOL) 500 MG tablet, Take 1,000 mg by mouth daily as needed for mild pain., Disp: , Rfl:  .  albuterol (PROVENTIL HFA;VENTOLIN HFA) 108 (90 Base) MCG/ACT inhaler, Inhale 2 puffs into the lungs every 6 (six) hours as needed for wheezing or shortness of breath., Disp: 1 Inhaler, Rfl: 2 .  amitriptyline (ELAVIL) 10 MG tablet, TAKE 2 TABLETS BY MOUTH AT BEDTIME, Disp: 60 tablet, Rfl: 12 .  aspirin EC 81 MG tablet, Take 81 mg by mouth daily., Disp: , Rfl:  .  cyanocobalamin (,VITAMIN B-12,) 1000 MCG/ML injection, Inject 1 mL (1,000 mcg total) into the muscle every 30 (thirty) days., Disp: 1 mL, Rfl: 12 .  ipratropium-albuterol (DUONEB) 0.5-2.5 (3) MG/3ML SOLN, Take 3 mLs by nebulization every 6 (six) hours as needed., Disp: 360 mL, Rfl: 0 .  levothyroxine (SYNTHROID, LEVOTHROID) 112 MCG tablet, TAKE 1 TABLET EVERY MORNING 30 TO 60 MINUTES BEFORE BREAKFAST WITH A FULL GLASS OF WATER, Disp: 90 tablet, Rfl: 0 .  nystatin (MYCOSTATIN) 100000 UNIT/ML suspension, Take 5 mLs (500,000 Units total) by mouth 4 (four) times daily., Disp: 60 mL, Rfl: 0 .  promethazine (PHENERGAN) 25 MG tablet, Take 1 tablet (25 mg total) by mouth every 6 (six) hours as needed for nausea or vomiting., Disp: 30 tablet, Rfl: 2 .  ranitidine (ZANTAC) 150 MG tablet, Take 150 mg by mouth 2 (two) times daily as needed for heartburn., Disp: , Rfl:  .  simvastatin (ZOCOR) 10 MG tablet, TAKE (1) TABLET BY MOUTH EVERY DAY AT 6:00 IN THE EVENING, Disp: 30 tablet, Rfl: 12 .  zolpidem (AMBIEN) 10 MG tablet, TAKE (1) TABLET BY MOUTH DAILY AT BEDTIME AS NEEDED FOR SLEEP, Disp: 30 tablet, Rfl: 5   Allergies:  Macrobid [nitrofurantoin]; Effexor [venlafaxine]; Hctz [hydrochlorothiazide]; Neurontin [gabapentin]; Paxil [paroxetine hcl]; Pravachol [pravastatin sodium]; Prozac [fluoxetine hcl]; Sulfur; Wellbutrin [bupropion]; and Zoloft [sertraline hcl]   Review of Systems:  Constitutional: Feels  well. Cardiovascular: Denies chest pain, exertional chest pain.  Pulmonary: Denies hemoptysis, pleuritic chest pain.   The remainder of systems were reviewed and were found to be negative other than what is documented in the HPI.    Physical Examination:   VS: BP 90/60 (BP Location: Left Arm, Cuff Size: Normal)   Pulse (!) 103   Resp 16   Ht 5\' 3"  (1.6 m)   Wt 145 lb (65.8 kg)   LMP  (LMP Unknown)   SpO2 94%   BMI 25.69 kg/m   General Appearance: No distress  Neuro:without focal findings, mental status, speech normal, alert and oriented HEENT: PERRLA, EOM intact Pulmonary: No wheezing, No rales  CardiovascularNormal S1,S2.  No m/r/g.  Abdomen: Benign, Soft, non-tender, No masses Renal:  No costovertebral tenderness  GU:  No performed at this time. Endoc: No evident thyromegaly, no signs of acromegaly or Cushing features Skin:   warm, no rashes, no ecchymosis  Extremities: normal, no cyanosis, clubbing.      LABORATORY PANEL:   CBC No results for input(s): WBC, HGB, HCT, PLT in the last 168 hours. ------------------------------------------------------------------------------------------------------------------  Chemistries  No results for input(s): NA, K, CL, CO2, GLUCOSE, BUN, CREATININE, CALCIUM, MG, AST, ALT, ALKPHOS, BILITOT in the last 168 hours.  Invalid input(s): GFRCGP ------------------------------------------------------------------------------------------------------------------  Cardiac Enzymes No results for input(s): TROPONINI in the last 168 hours. ------------------------------------------------------------  RADIOLOGY:  Results for orders placed during the hospital encounter of 11/25/16  DG Chest 2 View   Narrative CLINICAL DATA:  Shortness of breath.  EXAM: CHEST  2 VIEW  COMPARISON:  Radiographs of October 31, 2016.  FINDINGS: Stable cardiomediastinal silhouette. Atherosclerosis of thoracic aorta is noted. Right internal jugular  Port-A-Cath is stable in position. Right lung is clear but hyperexpanded. No pneumothorax is noted. Stable left perihilar and basilar opacities are noted which may represent edema, pneumonia or scarring.  IMPRESSION: Stable left lung opacities as described above. Aortic atherosclerosis.   Electronically Signed   By: Marijo Conception, M.D.   On: 11/25/2016 10:24    ------------------------------------------------------------------------------------------------------------------  Thank  you for allowing [facility] to assist in the care of your patient. Our recommendations are noted above.  Please contact us if we can be of further service.  Marda Stalker, M.D., F.C.C.P.  Board Certified in Internal Medicine, Pulmonary Medicine, [place], and Sleep Medicine.  [facility] Office Number: [phone]   09/06/2018

## 2018-09-07 ENCOUNTER — Ambulatory Visit (INDEPENDENT_AMBULATORY_CARE_PROVIDER_SITE_OTHER): Payer: Managed Care, Other (non HMO) | Admitting: Internal Medicine

## 2018-09-07 ENCOUNTER — Encounter: Payer: Self-pay | Admitting: Internal Medicine

## 2018-09-07 VITALS — BP 90/60 | HR 103 | Resp 16 | Ht 63.0 in | Wt 145.0 lb

## 2018-09-07 DIAGNOSIS — C3432 Malignant neoplasm of lower lobe, left bronchus or lung: Secondary | ICD-10-CM

## 2018-09-07 DIAGNOSIS — J9811 Atelectasis: Secondary | ICD-10-CM

## 2018-09-07 DIAGNOSIS — J90 Pleural effusion, not elsewhere classified: Secondary | ICD-10-CM

## 2018-09-07 DIAGNOSIS — J910 Malignant pleural effusion: Secondary | ICD-10-CM

## 2018-09-07 DIAGNOSIS — R918 Other nonspecific abnormal finding of lung field: Secondary | ICD-10-CM

## 2018-09-07 LAB — BODY FLUID CULTURE: CULTURE: NO GROWTH

## 2018-09-07 LAB — CYTOLOGY - NON PAP

## 2018-09-07 NOTE — Addendum Note (Signed)
Addended by: Stephanie Coup on: 09/07/2018 04:13 PM   Modules accepted: Orders

## 2018-09-07 NOTE — Addendum Note (Signed)
Addended by: Stephanie Coup on: 09/07/2018 04:10 PM   Modules accepted: Orders

## 2018-09-07 NOTE — Patient Instructions (Signed)
Will refer to thoracic surgery for left VATS pleural biopsy to rule out cancer and placement of left pleurX catheter.

## 2018-09-08 LAB — CHOLESTEROL, BODY FLUID: Cholesterol, Fluid: 47 mg/dL

## 2018-09-09 ENCOUNTER — Other Ambulatory Visit: Payer: Self-pay

## 2018-09-09 DIAGNOSIS — J90 Pleural effusion, not elsewhere classified: Secondary | ICD-10-CM

## 2018-09-13 ENCOUNTER — Ambulatory Visit
Admission: RE | Admit: 2018-09-13 | Discharge: 2018-09-13 | Disposition: A | Discharge status: Home / Self Care | Payer: Managed Care, Other (non HMO) | Source: Ambulatory Visit | Attending: Cardiothoracic Surgery | Admitting: Cardiothoracic Surgery

## 2018-09-13 ENCOUNTER — Ambulatory Visit (INDEPENDENT_AMBULATORY_CARE_PROVIDER_SITE_OTHER): Payer: Managed Care, Other (non HMO) | Admitting: Cardiothoracic Surgery

## 2018-09-13 ENCOUNTER — Encounter: Payer: Self-pay | Admitting: Cardiothoracic Surgery

## 2018-09-13 VITALS — BP 108/72 | HR 111 | Temp 97.8°F | Ht 63.0 in | Wt 145.0 lb

## 2018-09-13 DIAGNOSIS — J90 Pleural effusion, not elsewhere classified: Secondary | ICD-10-CM | POA: Diagnosis not present

## 2018-09-13 DIAGNOSIS — I700 Atherosclerosis of aorta: Secondary | ICD-10-CM | POA: Diagnosis not present

## 2018-09-13 NOTE — Patient Instructions (Addendum)
Please five [place] a call once you decide on what you would want to do.  Thoracentesis A thoracentesis is a procedure to remove fluid that has built up in the space between the linings of the chest wall and the lungs (pleural space). It is normal to have a small amount of fluid in the pleural space. Some medical conditions, such as heart failure, pneumonia, kidney problems, or cancer, can create too much fluid. This extra fluid is removed using a needle that is inserted through the skin and tissue and into the pleural space. A thoracentesis may be done to:  Understand why there is extra fluid in the pleural space and create a treatment plan that is right for you.  Help to get rid of shortness of breath, discomfort, or pain that is caused by the extra fluid.  Tell a health care provider about:  Any allergies you have.  All medicines you are taking, including vitamins, herbs, eye drops, creams, and over-the-counter medicines. This includes any use of steroids, either by mouth or in a cream.  Any problems you or family members have had with anesthetic medicines.  Any blood disorders you have, including any history of blood clots.  Any surgeries you have had.  Medical conditions you have, including: ? The possibility of pregnancy, if this applies. ? Have a frequent cough or coughing episodes. What are the risks? Generally, this is a safe procedure. However, problems may occur, including:  Infection.  Injury to the lung.  Lung collapse.  Bleeding.  What happens before the procedure?  You may have a chest X-ray or another imaging test, such as a CT scan or ultrasound, to determine the location and amount of fluid in your pleural space.  Ask your health care provider about: ? Changing or stopping your regular medicines. This is especially important if you are taking diabetes medicines or blood thinners. ? Taking medicines such as aspirin and ibuprofen. These medicines can thin your  blood. Do not take these medicines before your procedure if your health care provider instructs you not to. ? Taking a cough suppressant if you have a frequent cough or coughing episodes.  Plan to have someone take you home after the procedure. What happens during the procedure?  You will be asked to sit upright and lean slightly forward for the procedure.  An area of your back will be cleaned with a germ-killing solution (antiseptic).  You will be given a medicine that numbs the area (local anesthetic).  A needle will be inserted between your ribs and into the pleural space. You may feel pressure or slight pain as the needle is positioned into the pleural space.  Fluid will be removed from the pleural space through the needle. You may feel pressure as the fluid is removed.  The needle will be taken out after the excess fluid has been removed. A sample of the fluid may be sent to be examined.  The needle insertion site (puncture site) will be covered with a bandage (dressing). The procedure may vary among health care providers and hospitals. What happens after the procedure?  A chest X-ray may be done to check the amount of fluid that remains in your pleural space.  Your blood pressure, heart rate, breathing rate, and blood oxygen level will be monitored often until the medicines you were given have worn off.  It is your responsibility to obtain your test results. Ask the lab or department performing the test when and how you will  get your results. Talk with your health care provider if you have any questions about your results. This information is not intended to replace advice given to you by your health care provider. Make sure you discuss any questions you have with your health care provider. Document Released: 06/09/2005 Document Revised: 07/26/2016 Document Reviewed: 09/05/2014 Elsevier Interactive Patient Education  2018 [employer].   Indwelling Pleural Catheter Home  Guide An indwelling pleural catheter is a thin, flexible tube that is inserted under your skin and into your chest. The catheter drains excess fluid that collects in the area between the chest wall and the lungs (pleural space).After the catheter is inserted, it can be attached to a bottle that collects fluid. The pleural catheter will allow you to drain fluid from your chest at home on a regular basis (sometimes daily). This will eliminate the need for frequent visits to the hospital or clinic to drain the fluid. The catheter may be removed after the excess fluid problem is resolved, usually after 2-3 months. It is important to follow instructions from your health care provider about how to drain and care for your catheter. What are the risks? Generally, this is a safe procedure. However, problems may occur, including:  Infection.  Skin damage around the catheter.  Lung damage.  Failure of the chest tube to work properly.  Spreading of cancer cells along the catheter, if you have cancer.  Supplies needed:  Vacuum-sealed drainage bottle with attached drainage line.  Sterile dressing.  Sterile alcohol pads.  Sterile gloves.  Valve cap.  Sterile gauze pads, 4  4 inch (10 cm  10 cm).  Tape.  Adhesive dressing.  Sterile foam catheter pad. How to care for your catheter and insertion site  Wash your hands with soap and warm water before and after touching the catheter or insertion site. If soap and water are not available, use hand sanitizer.  Check your bandage (dressing) daily to make sure it is clean and dry.  Keep the skin around the catheter clean and dry.  Check the catheter regularly for any cracks or kinks in the tubing.  Check your catheter insertion site every day for signs of infection. Check for: ? Skin breakdown. ? Redness, swelling, or pain. ? Fluid or blood. ? Warmth. ? Pus or a bad smell. How to drain your catheter You may need to drain your catheter  every day, or more or less often as told by your health care provider. Follow instructions from your health care provider about how to drain your catheter. You may also refer to instructions that come with the drainage system. To drain the catheter: 1. Wash your hands with soap and warm water. If soap and water are not available, use hand sanitizer. 2. Carefully remove the dressing from around the catheter. 3. Wash your hands again. 4. Put on the gloves provided. 5. Prepare the vacuum-sealed drainage bottle and drainage line. Close the drainage line of the vacuum-sealed drainage bottle by squeezing the pinch clamp or rolling the wheel of the roller clamp toward the bottle. The vacuum in the bottle will be lost if the line is not closed completely. 6. Remove the access tip cover from the drainage line. Do not touch the end. Set it on a sterile surface. 7. Remove the catheter valve cap and throw it away. 8. Use an alcohol pad to clean the end of the catheter. 9. Insert the access tip into the catheter valve. Make sure the valve and access  tip are securely connected. Listen for a click to confirm that they are connected. 10. Insert the T plunger to break the vacuum seal on the drainage bottle. 11. Open the clamp on the drainage line. 12. Allow the catheter to drain. Keep the catheter and the drainage bottle below the level of your chest. There may be a one-way valve on the end of the tubing that will allow liquid and air to flow out of the catheter without letting air inside. 13. Drain the amount of fluid as told by your health care provider. It usually takes 5-15 minutes. Do not drain more than 1000 mL of fluid. You may feel a little discomfort while you are draining. If the pain is severe, stop draining and contact your health care provider. 14. After you finish draining the catheter, remove the drainage bottle tubing from the catheter. 15. Use a clean alcohol pad to wipe the catheter tip. 16. Place a  clean cap on the end of the catheter. 17. Use an alcohol pad to clean the skin around the catheter. 18. Allow the skin to air-dry. 19. Put the catheter pad on your skin. Curl the catheter into loops and place it on the pad. Do not place the catheter on your skin. 20. Replace the dressing over the catheter. 21. Discard the drainage bottle as instructed by your health care provider. Do not reuse the drainage bottle.  How to change your dressing Change your dressing at least once a week, or more often if needed to keep the dressing dry. Be sure to change the dressing whenever it becomes moist. Your health care provider will tell you how often to change your dressing. 1. Wash your hands with soap and warm water. If soap and water are not available, use hand sanitizer. 2. Gently remove the old dressing. Avoid using scissors to remove the dressing. Sharp objects may damage the catheter. 3. Wash the skin around the insertion site with mild, fragrance-free soap and warm water. Rinse well, then pat the area dry with a clean cloth. 4. Check the skin around the catheter for signs of infection. Check for: ? Skin breakdown. ? Redness, swelling, or pain. ? Fluid or blood. ? Warmth. ? Pus or a bad smell. 5. If your catheter was stitched (sutured) to your skin, look at the suture to make sure it is still anchored in your skin. 6. Do not apply creams, ointments, or alcohol to the area. Let your skin air-dry completely before you apply a new dressing. 7. Curl the catheter into loops and place it on the sterile catheter pad. Do not place the catheter on your skin. 8. If you do not have a pad, use a clean dressing. Slide the dressing under the disk that holds the drainage catheter in place. 9. Use gauze to cover the catheter and the catheter pad. The catheter should rest on the pad or dressing, not on your skin. 10. Tape the dressing to your skin. You may be instructed to use an adhesive dressing covering instead  of gauze and tape. 11. Wash your hands with soap and warm water. If soap and water are not available, use hand sanitizer.  General recommendations  Always wash your hands with soap and warm water before and after caring for your catheter and drainage bottle. Use a mild, fragrance-free soap. If soap and water are not available, use hand sanitizer.  Always make sure there are no leaks in the catheter or drainage bottle.  Each time you  drain the catheter, note the color and amount of fluid.  Do not touch the tip of the catheter or the drainage bottle tubing.  Do not reuse drainage bottles.  Do not take baths, swim, or use a hot tub until your health care provider approves. Ask your health care provider if you may take showers. You may only be allowed to take sponge baths.  Take deep breaths regularly, followed by a cough. Doing this can help to prevent lung infection. Contact a health care provider if:  You have any questions about caring for your catheter or drainage bottle.  You still have pain at the catheter insertion site more than 2 days after your procedure.  You have pain while draining your catheter.  Your catheter becomes bent, twisted, or cracked.  The connection between the catheter and the collection bottle becomes loose.  You have any of these around your catheter insertion site or coming from it: ? Skin breakdown. ? Redness, swelling, or pain. ? Fluid or blood. ? Warmth. ? Pus or a bad smell. Get help right away if:  You have a fever or chills.  You have chest pain.  You have dizziness or shortness of breath.  You have severe redness, swelling, or pain at your catheter insertion site.  The catheter comes out.  The catheter is blocked or clogged. Summary  An indwelling pleural catheter is a thin, flexible tube that is inserted under your skin and into your chest. The catheter drains excess fluid that collects in the area between the chest wall and the  lungs (pleural space).  It is important to follow instructions from your health care provider about how to drain and care for your catheter.  Do not touch the tip of the catheter or the drainage bottle tubing.  Always wash your hands with soap and water before and after caring for your catheter and drainage bottle. If soap and water are not available, use hand sanitizer. This information is not intended to replace advice given to you by your health care provider. Make sure you discuss any questions you have with your health care provider. Document Released: 03/19/2017 Document Revised: 03/19/2017 Document Reviewed: 03/19/2017 Elsevier Interactive Patient Education  [employer].

## 2018-09-13 NOTE — Progress Notes (Signed)
  Patient ID: Jill Gross, female   DOB: 04/30/1956, 62 y.o.   MRN: [id number]  HISTORY: Ms. Jill Gross returns today.  She recently saw Dr. Ashby Dawes who performed a left-sided ultrasound-guided thoracentesis.  He was able to remove about 300 cc when she began to experience significant discomfort.  The cytology from that remains negative.  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  The patient has not experienced a significant relief with any of her thoracenteses.  She states that she cannot tell much difference.  For the last several months she has been very weak with limited mobility.  She spends most of her time sedentary.  She does use oxygen continuously.   Vitals:   09/13/18 1002  BP: 108/72  Pulse: (!) 111  Temp: 97.8 F (36.6 C)  SpO2: (!) 89%     EXAM:    Resp: Lungs are clear on the right and absent on the left.  No respiratory distress, normal effort.  She is on nasal cannula oxygen and is sitting in a wheelchair. Heart:  Regular without murmurs Abd:  Abdomen is soft, non distended and non tender. No masses are palpable.  There is no rebound and no guarding.  Neurological: Alert and oriented to person, place, and time. Coordination normal.  Skin: Skin is warm and dry. No rash noted. No diaphoretic. No erythema. No pallor.  Psychiatric: Normal mood and affect. Normal behavior. Judgment and thought content normal.    ASSESSMENT: We did get a chest x-ray today.  There is a large left-sided pleural effusion.  There is complete atelectasis of the left lung.   PLAN:   I had a long discussion with her regarding the options at this point in time.  I did explain to her that we could perform a thoracoscopy with pleural biopsy and Pleurx catheter insertion which would give [place] the best  chance of making a diagnosis.  However I am not sure that the Pleurx catheter will help if the left lung is unable to expand secondary to obstruction of the left mainstem bronchus.  There is no obvious recurrence at this point in time.  The most recent pleural fluid cytology was negative.  She understands that although this is encouraging it certainly does not rule out a malignant pleural effusion.  After an extensive discussion with the patient she would like some information regarding the Pleurx catheter.  We provided her with some educational materials including a DVD and printed material.  She will contact our office if she would like [place] to proceed.    [name], MD

## 2018-09-17 ENCOUNTER — Other Ambulatory Visit: Payer: Self-pay | Admitting: Family Medicine

## 2018-09-17 NOTE — Telephone Encounter (Signed)
Spoke with patient about her request for synthroid refill. Pt states she will need a refill before her next visit.   Call placed to [facility]. They have just refilled 30 day supply for patient.  They will contact her to pick up med.

## 2018-09-17 NOTE — Telephone Encounter (Signed)
Requested Prescriptions  Pending Prescriptions Disp Refills  . levothyroxine (SYNTHROID, LEVOTHROID) 112 MCG tablet [Pharmacy Med Name: LEVOTHYROXINE SODIUM 112 MCG TAB] 30 tablet 0    Sig: TAKE 1 TABLET ON AN EMPTY STOMACH WITH A GLASS OF WATER AT LEAST 30 TO 60 MINUTES BEFORE BREAKFAST.     Endocrinology:  Hypothyroid Agents Failed - 09/17/2018 11:56 AM      Failed - TSH needs to be rechecked within 3 months after an abnormal result. Refill until TSH is due.      Passed - TSH in normal range and within 360 days    TSH  Date Value Ref Range Status  04/13/2018 1.170 0.450 - 4.500 uIU/mL Final         Passed - Valid encounter within last 12 months    Recent Outpatient Visits          5 months ago Essential hypertension   [facility] [name], NP   11 months ago Essential hypertension   [facility] [name], NP   1 year ago Hypothyroidism, unspecified type   [facility] [name], NP   1 year ago Vitamin B12 deficiency   [facility] [name], NP   1 year ago B12 deficiency   [facility] [name], NP      Future Appointments            In 4 weeks Orene Desanctis, Lilia Argue, [place], [place]

## 2018-09-24 NOTE — Telephone Encounter (Signed)
Physician aware. Rhonda J Cobb

## 2018-10-15 ENCOUNTER — Inpatient Hospital Stay: Payer: Managed Care, Other (non HMO) | Attending: Oncology | Admitting: Oncology

## 2018-10-15 ENCOUNTER — Encounter: Payer: Self-pay | Admitting: Family Medicine

## 2018-10-15 ENCOUNTER — Ambulatory Visit: Payer: Managed Care, Other (non HMO) | Admitting: Unknown Physician Specialty

## 2018-10-15 ENCOUNTER — Ambulatory Visit (INDEPENDENT_AMBULATORY_CARE_PROVIDER_SITE_OTHER): Payer: Managed Care, Other (non HMO) | Admitting: Family Medicine

## 2018-10-15 ENCOUNTER — Encounter: Payer: Self-pay | Admitting: Oncology

## 2018-10-15 ENCOUNTER — Other Ambulatory Visit: Payer: Self-pay | Admitting: *Deleted

## 2018-10-15 VITALS — BP 121/86 | HR 105 | Temp 98.2°F | Resp 20

## 2018-10-15 VITALS — BP 116/76 | HR 101 | Temp 98.5°F | Wt 148.0 lb

## 2018-10-15 DIAGNOSIS — Z7982 Long term (current) use of aspirin: Secondary | ICD-10-CM

## 2018-10-15 DIAGNOSIS — G4700 Insomnia, unspecified: Secondary | ICD-10-CM | POA: Diagnosis not present

## 2018-10-15 DIAGNOSIS — F419 Anxiety disorder, unspecified: Secondary | ICD-10-CM | POA: Insufficient documentation

## 2018-10-15 DIAGNOSIS — Z79899 Other long term (current) drug therapy: Secondary | ICD-10-CM | POA: Diagnosis not present

## 2018-10-15 DIAGNOSIS — Z01812 Encounter for preprocedural laboratory examination: Secondary | ICD-10-CM

## 2018-10-15 DIAGNOSIS — Z23 Encounter for immunization: Secondary | ICD-10-CM | POA: Diagnosis not present

## 2018-10-15 DIAGNOSIS — G629 Polyneuropathy, unspecified: Secondary | ICD-10-CM | POA: Diagnosis not present

## 2018-10-15 DIAGNOSIS — Z86711 Personal history of pulmonary embolism: Secondary | ICD-10-CM | POA: Diagnosis not present

## 2018-10-15 DIAGNOSIS — G6289 Other specified polyneuropathies: Secondary | ICD-10-CM

## 2018-10-15 DIAGNOSIS — Z87891 Personal history of nicotine dependence: Secondary | ICD-10-CM | POA: Insufficient documentation

## 2018-10-15 DIAGNOSIS — C3490 Malignant neoplasm of unspecified part of unspecified bronchus or lung: Secondary | ICD-10-CM

## 2018-10-15 DIAGNOSIS — I10 Essential (primary) hypertension: Secondary | ICD-10-CM

## 2018-10-15 DIAGNOSIS — E785 Hyperlipidemia, unspecified: Secondary | ICD-10-CM

## 2018-10-15 DIAGNOSIS — C3432 Malignant neoplasm of lower lobe, left bronchus or lung: Secondary | ICD-10-CM | POA: Insufficient documentation

## 2018-10-15 MED ORDER — AMITRIPTYLINE HCL 25 MG PO TABS: ORAL_TABLET | ORAL | 5 refills | Status: DC

## 2018-10-15 NOTE — Assessment & Plan Note (Signed)
Increase elavil to 25 mg 1-2 tabs nightly, monitor for improvement

## 2018-10-15 NOTE — Assessment & Plan Note (Signed)
Continue ambien, will increase elavil to 25 mg 1-2 tabs nightly to help improve neuropathy and sleep

## 2018-10-15 NOTE — Patient Instructions (Signed)

## 2018-10-15 NOTE — Assessment & Plan Note (Signed)
Stable and WNL, continue current regimen 

## 2018-10-15 NOTE — Progress Notes (Signed)
Patient here today for follow up regarding lung cancer, fluid in lung.  Patient has seen pulmonary as well as Dr. Genevive Bi, is here today to discuss best plan moving forward regarding fluid removal. Patient had thoracentesis and has been evaluated by Dr. Genevive Bi for possible pleurex catheter placement.

## 2018-10-15 NOTE — Progress Notes (Signed)
BP 116/76   Pulse (!) 101   Temp 98.5 F (36.9 C) (Oral)   Wt 148 lb (67.1 kg)   LMP  (LMP Unknown)   SpO2 99%   BMI 26.22 kg/m    Subjective:    Patient ID: Jill Gross, female    DOB: [birth year], 62 y.o.   MRN: [id number]  HPI: Jill Gross is a 62 y.o. female  Chief Complaint  Patient presents with  . Depression  . Hyperlipidemia  . Hypertension   Here today for 6 month f/u. Taking all medications faithfully without side effects.   Does not check home BPs, states they're typically always WNL when checked. Denies CP, HAs, dizziness, SOB. Taking simvastatin for chol management without issue. Tries to watch what she eats but unable to be very active, in a wheelchair.   Currently taking 20 mg elavil for neuropathy at bedtime with mild relief. Still having cold sensation then burning sensations intermittently in feet. Feels this affects her sleep significantly. Taking ambien nightly for years and cannot sleep without it.   Depression screen [facility] 2/9 10/15/2018 10/14/2017 07/15/2017  Decreased Interest 0 0 0  Down, Depressed, Hopeless 0 0 0  PHQ - 2 Score 0 0 0  Altered sleeping 1 0 -  Tired, decreased energy 1 3 -  Change in appetite 0 0 -  Feeling bad or failure about yourself  0 0 -  Trouble concentrating 0 0 -  Moving slowly or fidgety/restless 0 0 -  Suicidal thoughts 0 0 -  PHQ-9 Score 2 3 -  Some recent data might be hidden   Relevant past medical, surgical, family and social history reviewed and updated as indicated. Interim medical history since our last visit reviewed. Allergies and medications reviewed and updated.  Review of Systems  Per HPI unless specifically indicated above     Objective:    BP 116/76   Pulse (!) 101   Temp 98.5 F (36.9 C) (Oral)   Wt 148 lb (67.1 kg)   LMP  (LMP Unknown)   SpO2 99%   BMI 26.22 kg/m   Wt Readings from Last 3 Encounters:  10/15/18 148 lb (67.1 kg)  09/13/18 145 lb (65.8 kg)  09/07/18 145 lb (65.8 kg)      Physical Exam  Constitutional: She is oriented to person, place, and time. She appears well-developed and well-nourished. No distress.  HENT:  Head: Atraumatic.  Eyes: Conjunctivae and EOM are normal.  Neck: Normal range of motion. Neck supple.  Cardiovascular: Normal heart sounds.  Pulmonary/Chest: Effort normal. No respiratory distress.  Musculoskeletal: She exhibits no edema.  In wheelchair  Neurological: She is alert and oriented to person, place, and time.  Skin: Skin is warm and dry.  Psychiatric: She has a normal mood and affect. Her behavior is normal. Thought content normal.  Nursing note and vitals reviewed.   Results for orders placed or performed during the hospital encounter of 08/25/18  I-STAT creatinine  Result Value Ref Range   Creatinine, Ser 0.80 0.44 - 1.00 mg/dL      Assessment & Plan:   Problem List Items Addressed This Visit      Cardiovascular and Mediastinum   Hypertension - Primary    Stable and WNL, continue current regimen      Relevant Orders   Comprehensive metabolic panel     Nervous and Auditory   Peripheral neuropathy    Increase elavil to 25 mg 1-2 tabs nightly, monitor for  improvement      Relevant Medications   amitriptyline (ELAVIL) 25 MG tablet     Other   Insomnia    Continue ambien, will increase elavil to 25 mg 1-2 tabs nightly to help improve neuropathy and sleep      Hyperlipidemia    Recheck lipids, adjust as needed. Work on dietary modifications      Relevant Orders   Comprehensive metabolic panel   Lipid Panel w/o Chol/HDL Ratio    Other Visit Diagnoses    Need for influenza vaccination       Relevant Orders   Flu Vaccine QUAD 36+ mos IM (Completed)       Follow up plan: Return in about 6 months (around 04/15/2019) for CPE.

## 2018-10-15 NOTE — Assessment & Plan Note (Signed)
Recheck lipids, adjust as needed. Work on dietary modifications

## 2018-10-15 NOTE — Progress Notes (Signed)
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  INTERVAL HISTORY: Patient returns to clinic today for further follow-up and evaluation.  She has been evaluated by pulmonary and thoracic surgery in the interim.  She had a thoracentesis of approximately 300 to 350 mL that did not reveal any malignant cells.  Patient states he only mildly symptomatically improved with the fluid removal.  She currently feels well and is at her baseline.  She continues to have chronic shortness of breath requiring oxygen that is unchanged. She does not complain of increased cough, hemoptysis, or chest pain.  She has no neurologic complaints.  She denies any recent fevers or illnesses.  She has a fair appetite and denies weight loss.  She denies any nausea, vomiting, constipation, or diarrhea. She has no urinary complaints.  Patient offers no further specific complaints today.  REVIEW OF SYSTEMS:   Review of Systems  Constitutional: Positive for malaise/fatigue. Negative for fever and weight loss.  HENT: Negative for sore throat.   Eyes: Negative.   Respiratory: Positive for shortness of breath. Negative for cough and hemoptysis.   Cardiovascular: Negative.  Negative for chest pain and leg swelling.  Gastrointestinal: Negative for nausea and vomiting.  Genitourinary: Negative.  Negative for dysuria.  Musculoskeletal: Negative.  Negative for back pain.  Skin: Negative.   Neurological: Positive for sensory change and weakness. Negative for  dizziness, tingling and focal weakness.  Psychiatric/Behavioral: Negative.  Negative for depression. The patient is not nervous/anxious.     As per HPI. Otherwise, a complete review of systems is negative.  PAST MEDICAL HISTORY: Past Medical History:  Diagnosis Date  . Allergy   . Anemia    pernicious  . Anxiety   . Arthritis   . Atrophic vaginitis   . Back pain   . COPD (chronic obstructive pulmonary disease) ([place])   . Depression   . Diverticulosis   . Dyspnea   . Family history of adverse reaction to anesthesia    MOM-BUT UNSURE WHAT REACTION WAS  . Fatigue   . GERD (gastroesophageal reflux disease)   . H/O degenerative disc disease   . Hashimoto's disease   . History of chemotherapy 04/2016  . Hyperlipidemia   . Hypothyroid   . Insomnia   . Low back pain   . Lumbago   . Neuropathy due to chemotherapeutic drug ([place])   . Osteopenia   . Pneumonia 11/2016  . Small cell carcinoma of left lung ([place]) 01/2016   rad + chemo tx's  . Tobacco use   . Vitamin B12 deficiency     PAST SURGICAL HISTORY: Past Surgical History:  Procedure Laterality Date  . ABDOMINAL HYSTERECTOMY    . ANKLE SURGERY Left    x4  . CARPAL TUNNEL RELEASE Right 03/2014  . CHOLECYSTECTOMY    . ENDOBRONCHIAL ULTRASOUND N/A 01/07/2016   Procedure: ENDOBRONCHIAL ULTRASOUND;  Surgeon: Vilinda Boehringer, MD;  Location: [facility] ORS;  Service: Cardiopulmonary;  Laterality: N/A;  . ENDOBRONCHIAL ULTRASOUND N/A 09/16/2016   Procedure: ENDOBRONCHIAL ULTRASOUND;  Surgeon: Vilinda Boehringer, MD;  Location: [facility] ORS;  Service:  Cardiopulmonary;  Laterality: N/A;  . ENDOBRONCHIAL ULTRASOUND N/A 07/02/2017   Procedure: ENDOBRONCHIAL ULTRASOUND;  Surgeon: Laverle Hobby, MD;  Location: [facility] OR[facility];  Service: Pulmonary;  Laterality: N/A;  . HERNIA REPAIR    . PORT-A-CATH REMOVAL N/A 09/08/2017   Procedure: REMOVAL PORT-A-CATH;  Surgeon: [name], MD;  Location: [facility] OR[facility];  Service: General;  Laterality: N/A;  .  PORTACATH PLACEMENT Right 01/23/2016   Procedure: INSERTION PORT-A-CATH;  Surgeon: [name], MD;  Location: [facility];  Service: General;  Laterality: Right;    FAMILY HISTORY Family History  Problem Relation Age of Onset  . Emphysema Mother   . Diabetes Father        ADVANCED DIRECTIVES:    HEALTH MAINTENANCE: Social History   Tobacco Use  . Smoking status: Former Smoker    Packs/day: 1.00    Years: 30.00    Pack years: 30.00    Types: Cigarettes    Last attempt to quit: 01/11/2016    Years since quitting: 2.7  . Smokeless tobacco: Never Used  Substance Use Topics  . Alcohol use: No    Alcohol/week: 0.0 standard drinks  . Drug use: No     Allergies  Allergen Reactions  . Macrobid [Nitrofurantoin] Anaphylaxis  . Effexor [Venlafaxine] Other (See Comments)    UNKNOWN  . Hctz [Hydrochlorothiazide] Other (See Comments)    cramps  . Neurontin [Gabapentin] Other (See Comments)    UNKNOWN   . Paxil [Paroxetine Hcl] Other (See Comments)    UNKNOWN   . Pravachol [Pravastatin Sodium] Other (See Comments)    aching  . Prozac [Fluoxetine Hcl] Other (See Comments)    UNKNOWN   . Sulfur Itching and Other (See Comments)    "blisters in mouth"  . Wellbutrin [Bupropion] Itching  . Zoloft [Sertraline Hcl] Other (See Comments)    UNKNOWN     Current Outpatient Medications  Medication Sig Dispense Refill  . acetaminophen (TYLENOL) 500 MG tablet Take 1,000 mg by mouth daily as needed for mild pain.    Marland Kitchen albuterol (PROVENTIL HFA;VENTOLIN HFA) 108 (90 Base) MCG/ACT inhaler Inhale 2 puffs into the lungs every 6 (six) hours as needed for wheezing or shortness of breath. 1 Inhaler 2  . aspirin EC 81 MG tablet Take 81 mg by mouth daily.    . cyanocobalamin (,VITAMIN B-12,) 1000 MCG/ML injection Inject 1 mL (1,000 mcg total) into the muscle every 30 (thirty) days. 1 mL 12  . ipratropium-albuterol (DUONEB) 0.5-2.5 (3) MG/3ML SOLN Take 3 mLs by nebulization every 6 (six) hours as  needed. 360 mL 0  . levothyroxine (SYNTHROID, LEVOTHROID) 112 MCG tablet TAKE 1 TABLET ON AN EMPTY STOMACH WITH A GLASS OF WATER AT LEAST 30 TO 60 MINUTES BEFORE BREAKFAST. 30 tablet 0  . promethazine (PHENERGAN) 25 MG tablet Take 1 tablet (25 mg total) by mouth every 6 (six) hours as needed for nausea or vomiting. 30 tablet 2  . ranitidine (ZANTAC) 150 MG tablet Take 150 mg by mouth 2 (two) times daily as needed for heartburn.    . simvastatin (ZOCOR) 10 MG tablet TAKE (1) TABLET BY MOUTH EVERY DAY AT 6:00 IN THE EVENING 30 tablet 12  . zolpidem (AMBIEN) 10 MG tablet TAKE (1) TABLET BY MOUTH DAILY AT BEDTIME AS NEEDED FOR SLEEP 30 tablet 5  . amitriptyline (ELAVIL) 25 MG tablet Take 1-2 tablets at bedtime daily 60 tablet 5   No current facility-administered medications for this visit.     OBJECTIVE: Vitals:  10/15/18 1336  BP: 121/86  Pulse: (!) 105  Resp: 20  Temp: 98.2 F (36.8 C)  SpO2: 99%     There is no height or weight on file to calculate BMI.    ECOG FS:2 - Symptomatic, <50% confined to bed  General: Well-developed, well-nourished, no acute distress.  Sitting in a wheelchair. Eyes: Pink conjunctiva, anicteric sclera. HEENT: Normocephalic, moist mucous membranes, clear oropharnyx. Lungs: Minimal left lung breath sounds. Heart: Regular rate and rhythm. No rubs, murmurs, or gallops. Abdomen: Soft, nontender, nondistended. No organomegaly noted, normoactive bowel sounds. Musculoskeletal: No edema, cyanosis, or clubbing. Neuro: Alert, answering all questions appropriately. Cranial nerves grossly intact. Skin: No rashes or petechiae noted. Psych: Normal affect.  LAB RESULTS:  Lab Results  Component Value Date   NA 135 10/15/2018   K 4.4 10/15/2018   CL 96 10/15/2018   CO2 25 10/15/2018   GLUCOSE 92 10/15/2018   BUN 11 10/15/2018   CREATININE 0.71 10/15/2018   CALCIUM 9.1 10/15/2018   PROT 7.1 10/15/2018   ALBUMIN 3.9 10/15/2018   AST 21 10/15/2018   ALT 12  10/15/2018   ALKPHOS 109 10/15/2018   BILITOT 0.2 10/15/2018   GFRNONAA 92 10/15/2018   GFRAA 106 10/15/2018    Lab Results  Component Value Date   WBC 8.0 09/04/2017   NEUTROABS 5.9 09/04/2017   HGB 12.3 09/04/2017   HCT 36.1 09/04/2017   MCV 90.8 09/04/2017   PLT 277 09/04/2017     STUDIES: No results found.  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  PLAN:    1. 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  Previously, bronchoscopy and FNA results on September 16, 2016 were negative for malignancy.  Pleural fluid from her most recent paracentesis on September 13, 2018 did not reveal any malignant cells.  Appreciate thoracic surgery and pulmonology input.  Patient does not wish to pursue Pleurx catheter or pleurodesis.  Patient has follow-up with pulmonology in approximately 4 to 6 weeks and will defer repeat bronchoscopy to them.  Return to clinic in 3 months with repeat imaging and further evaluation.  2.  History of pulmonary embolism: Likely secondary to increased sedentary lifestyle.  Patient completed 6 months of Eliquis.   3. Peripheral neuropathy: Patient does not complain of this today.  Patient is no longer taking gabapentin. 4. Anemia: Resolved.  Patient's most recent hemoglobin was within normal limits.   5. Thyroid: Continue current dose of 125 g Synthroid. Will defer to primary care for adjustment of medications.  6. Pain: Patient does not complain of pain today.  Continue Percocet as needed. 7. Anxiety: Chronic and unchanged.  Continue Xanax as needed. 8.  Nausea: Patient does not complain of this today.  Continue Phenergan as needed.  I spent a total of 30 minutes face-to-face with the patient of which greater than 50% of the visit was  spent in counseling and coordination of care as detailed above.  Patient expressed understanding and was in agreement with this plan. She also understands that She can call clinic at any time with any questions, concerns, or complaints.    Lloyd Huger, MD   10/17/2018 9:13 AM

## 2018-10-16 LAB — COMPREHENSIVE METABOLIC PANEL
A/G RATIO: 1.2 (ref 1.2–2.2)
ALT: 12 IU/L (ref 0–32)
AST: 21 IU/L (ref 0–40)
Albumin: 3.9 g/dL (ref 3.6–4.8)
Alkaline Phosphatase: 109 IU/L (ref 39–117)
BILIRUBIN TOTAL: 0.2 mg/dL (ref 0.0–1.2)
BUN/Creatinine Ratio: 15 (ref 12–28)
BUN: 11 mg/dL (ref 8–27)
CALCIUM: 9.1 mg/dL (ref 8.7–10.3)
CHLORIDE: 96 mmol/L (ref 96–106)
CO2: 25 mmol/L (ref 20–29)
Creatinine, Ser: 0.71 mg/dL (ref 0.57–1.00)
GFR calc non Af Amer: 92 mL/min/{1.73_m2} (ref >59)
GFR, EST AFRICAN AMERICAN: 106 mL/min/{1.73_m2} (ref >59)
Globulin, Total: 3.2 g/dL (ref 1.5–4.5)
Glucose: 92 mg/dL (ref 65–99)
POTASSIUM: 4.4 mmol/L (ref 3.5–5.2)
Sodium: 135 mmol/L (ref 134–144)
TOTAL PROTEIN: 7.1 g/dL (ref 6.0–8.5)

## 2018-10-16 LAB — LIPID PANEL W/O CHOL/HDL RATIO
Cholesterol, Total: 132 mg/dL (ref 100–199)
HDL: 36 mg/dL — AB (ref >39)
LDL Calculated: 71 mg/dL (ref 0–99)
TRIGLYCERIDES: 127 mg/dL (ref 0–149)
VLDL CHOLESTEROL CAL: 25 mg/dL (ref 5–40)

## 2018-10-17 LAB — ACID FAST CULTURE WITH REFLEXED SENSITIVITIES (MYCOBACTERIA): Acid Fast Culture: NEGATIVE

## 2018-11-15 ENCOUNTER — Other Ambulatory Visit: Payer: Self-pay | Admitting: Family Medicine

## 2018-11-15 ENCOUNTER — Other Ambulatory Visit: Payer: Self-pay | Admitting: Unknown Physician Specialty

## 2018-12-06 ENCOUNTER — Ambulatory Visit: Payer: Managed Care, Other (non HMO) | Admitting: Internal Medicine

## 2018-12-13 ENCOUNTER — Other Ambulatory Visit: Payer: Self-pay | Admitting: Unknown Physician Specialty

## 2018-12-14 ENCOUNTER — Ambulatory Visit
Admission: RE | Admit: 2018-12-14 | Discharge: 2018-12-14 | Disposition: A | Discharge status: Home / Self Care | Payer: Managed Care, Other (non HMO) | Source: Ambulatory Visit | Attending: Internal Medicine | Admitting: Internal Medicine

## 2018-12-14 ENCOUNTER — Ambulatory Visit (INDEPENDENT_AMBULATORY_CARE_PROVIDER_SITE_OTHER): Payer: Managed Care, Other (non HMO) | Admitting: Internal Medicine

## 2018-12-14 ENCOUNTER — Encounter: Payer: Self-pay | Admitting: Internal Medicine

## 2018-12-14 VITALS — BP 100/60 | HR 107 | Ht 63.0 in

## 2018-12-14 DIAGNOSIS — J90 Pleural effusion, not elsewhere classified: Secondary | ICD-10-CM

## 2018-12-14 MED ORDER — FUROSEMIDE 20 MG PO TABS: 20.0000 mg | ORAL_TABLET | ORAL | 11 refills | Status: AC

## 2018-12-14 MED ORDER — POTASSIUM CHLORIDE ER 10 MEQ PO TBCR: 10.0000 meq | EXTENDED_RELEASE_TABLET | ORAL | 11 refills | Status: AC

## 2018-12-14 NOTE — Progress Notes (Signed)
* [facility]     Assessment and Plan:  Left lung small cell lung cancer. -Progressive left lung atelectasis, status post bronchoscopy x2.  Suspect that this represents area of fibrosis with effusion.  Left lung atelectasis, secondary to enlarging pleural effusion. -Reoccurring left pleural effusion, exudative, appears predominantly lymphocytic.  Thoracentesis x2- for malignancy, flow cytometry, microbiology, AFB testing all negative. --S/p re-bronch x2 since initial diagnosis, negative for cancer.  - Patient was referred to thoracic surgery for potential left pleural biopsy and placement of Pleurx catheter, however she declined. - We will use Lasix 20 mg every other day along with a potassium supplement.  Chest x-ray today.  History of pulmonary embolism --In December 2017, completed 6 months of anticoagulation.    No follow-ups on file.   Date: 12/14/2018  MRN# [id number] Jill Gross 03/14/1956   Jill Gross is a 63 y.o. old female seen in follow up for chief complaint of  Chief Complaint  Patient presents with  . Follow-up    SOB with exertion with chest tightness, cough with yellow mucus      Synopsis: The patient is a 63 year old female with lung cancer, clinical stage IIa, T2 N1 M0, limited stage small cell lung cancer of the bronchus of left lower lobe/left hilar mass diagnosed on bronchoscopy on 01/07/2016.  She underwent radiation chemotherapy with apparent good response.  She had persistent left lower lobe atelectasis, therefore underwent bronchoscopy on 09/16/2016 which was negative for recurrence.  Her left lower lobe atelectasis persisted on subsequent scans, there remained concern about recurrence, she therefore underwent repeat EBUS bronchoscopy on 07/02/2017;  at that time it was noted that she had 90% stenosis of the left lower lobe bronchus, biopsies were negative for recurrence, she has subsequently had her Port-A-Cath removed on  09/08/2017.  The patient subsequently underwent a restaging chest CT on 08/25/2018 which again showed a left lower lobe atelectasis, which had progressed to complete left lung atelectasis.  In comparison with previous scan it appears that the patient's pleural effusion seen on the previous scan  enlarged with resulting compression of the complete left lung. She had previously a left thoracentesis on 03/25/17; thoracentesis negative for malignancy. She underwent a repeat left thoracentesis on 09/03/18 with drainage of 300 cc under sedation. She had significant pain during drainage despite IV pain medications.  She had little relief after the drainage, subsequently patient was referred to thoracic surgery for placement of a Pleurx catheter.  She is diagnosed with pulmonary embolism in December 2017, subsequently treated with anti-coagulation for 6 months.  Subjective:  Since her last visit she saw Dr. Genevive Bi, and she opted not to go with pleurx catheter. She notes that her breathing has worsened, and she has been having swelling in her feet.   **CT chest 08/25/2018; imaging personally reviewed, there continues to be atelectasis of much of the left lung.  In comparison with previous CT scan on 02/18/2018, there is now complete atelectasis of the left lung, as opposed to previously where there was atelectasis of the left lower lobe but the lingula remained open.  Comparison with previous it looks like the pleural effusion/pericardial effusion is now larger which further compressed of the lung including the left upper lobe. **CT chest 05/13/17. There is a complete atelectasis of the left lung, the left bronchus intermedius appears to end blindly, likely due to external compression. There appears to be a lung mass, distally and laterally, there is a moderate pleural effusion, with  leftward mediastinal shift.  She had a left thoracentesis on 03/25/17; thoracentesis negative for malignancy.     Medication:     Current Outpatient Medications:  .  acetaminophen (TYLENOL) 500 MG tablet, Take 1,000 mg by mouth daily as needed for mild pain., Disp: , Rfl:  .  albuterol (PROVENTIL HFA;VENTOLIN HFA) 108 (90 Base) MCG/ACT inhaler, Inhale 2 puffs into the lungs every 6 (six) hours as needed for wheezing or shortness of breath., Disp: 1 Inhaler, Rfl: 2 .  amitriptyline (ELAVIL) 10 MG tablet, TAKE 2 TABLETS BY MOUTH AT BEDTIME, Disp: 60 tablet, Rfl: 3 .  amitriptyline (ELAVIL) 25 MG tablet, Take 1-2 tablets at bedtime daily, Disp: 60 tablet, Rfl: 5 .  aspirin EC 81 MG tablet, Take 81 mg by mouth daily., Disp: , Rfl:  .  cyanocobalamin (,VITAMIN B-12,) 1000 MCG/ML injection, INJECT 1 MILLILITER INTO THE MUSCLE ONCE, Disp: 10 mL, Rfl: 1 .  ipratropium-albuterol (DUONEB) 0.5-2.5 (3) MG/3ML SOLN, Take 3 mLs by nebulization every 6 (six) hours as needed., Disp: 360 mL, Rfl: 0 .  levothyroxine (SYNTHROID, LEVOTHROID) 112 MCG tablet, TAKE 1 TABLET ON AN EMPTY STOMACH WITH A GLASS OF WATER AT LEAST 30 TO 60 MINUTES BEFORE BREAKFAST., Disp: 30 tablet, Rfl: 5 .  levothyroxine (SYNTHROID, LEVOTHROID) 125 MCG tablet, TAKE 1 TABLET ON AN EMPTY STOMACH WITH A GLASS OF WATER AT LEAST 30 TO 60 MINUTES BEFORE BREAKFAST., Disp: 90 tablet, Rfl: 0 .  promethazine (PHENERGAN) 25 MG tablet, Take 1 tablet (25 mg total) by mouth every 6 (six) hours as needed for nausea or vomiting., Disp: 30 tablet, Rfl: 2 .  ranitidine (ZANTAC) 150 MG tablet, Take 150 mg by mouth 2 (two) times daily as needed for heartburn., Disp: , Rfl:  .  simvastatin (ZOCOR) 10 MG tablet, TAKE (1) TABLET BY MOUTH EVERY DAY AT 6:00 IN THE EVENING, Disp: 30 tablet, Rfl: 12 .  zolpidem (AMBIEN) 10 MG tablet, TAKE ONE TABLET BY MOUTH DAILY AT BEDTIME AS NEEDED FOR SLEEP., Disp: 30 tablet, Rfl: 5   Allergies:  Macrobid [nitrofurantoin]; Effexor [venlafaxine]; Hctz [hydrochlorothiazide]; Neurontin [gabapentin]; Paxil [paroxetine hcl]; Pravachol [pravastatin sodium];  Prozac [fluoxetine hcl]; Sulfur; Wellbutrin [bupropion]; and Zoloft [sertraline hcl]   Review of Systems:  Constitutional: Feels well. Cardiovascular: Denies chest pain, exertional chest pain.  Pulmonary: Denies hemoptysis, pleuritic chest pain.   The remainder of systems were reviewed and were found to be negative other than what is documented in the HPI.    Physical Examination:   VS: BP 100/60 (BP Location: Left Arm, Cuff Size: Normal)   Pulse (!) 107   Ht 5\' 3"  (1.6 m)   LMP  (LMP Unknown)   SpO2 99%   BMI 26.22 kg/m   General Appearance: No distress  Neuro:without focal findings, mental status, speech normal, alert and oriented HEENT: PERRLA, EOM intact Pulmonary: No wheezing, No rales  CardiovascularNormal S1,S2.  No m/r/g.  Abdomen: Benign, Soft, non-tender, No masses Renal:  No costovertebral tenderness  GU:  No performed at this time. Endoc: No evident thyromegaly, no signs of acromegaly or Cushing features Skin:   warm, no rashes, no ecchymosis  Extremities: normal, no cyanosis, clubbing.    LABORATORY PANEL:   CBC No results for input(s): WBC, HGB, HCT, PLT in the last 168 hours. ------------------------------------------------------------------------------------------------------------------  Chemistries  No results for input(s): NA, K, CL, CO2, GLUCOSE, BUN, CREATININE, CALCIUM, MG, AST, ALT, ALKPHOS, BILITOT in the last 168 hours.  Invalid input(s): GFRCGP ------------------------------------------------------------------------------------------------------------------  Cardiac Enzymes  No results for input(s): TROPONINI in the last 168 hours. ------------------------------------------------------------  RADIOLOGY:    Results for orders placed during the hospital encounter of 11/25/16  DG Chest 2 View   Narrative CLINICAL DATA:  Shortness of breath.  EXAM: CHEST  2 VIEW  COMPARISON:  Radiographs of October 31, 2016.  FINDINGS: Stable  cardiomediastinal silhouette. Atherosclerosis of thoracic aorta is noted. Right internal jugular Port-A-Cath is stable in position. Right lung is clear but hyperexpanded. No pneumothorax is noted. Stable left perihilar and basilar opacities are noted which may represent edema, pneumonia or scarring.  IMPRESSION: Stable left lung opacities as described above. Aortic atherosclerosis.   Electronically Signed   By: Marijo Conception, M.D.   On: 11/25/2016 10:24    ------------------------------------------------------------------------------------------------------------------  Thank  you for allowing [facility] to assist in the care of your patient. Our recommendations are noted above.  Please contact us if we can be of further service.  Marda Stalker, M.D., F.C.C.P.  Board Certified in Internal Medicine, Pulmonary Medicine, [place], and Sleep Medicine.  [facility] Office Number: [phone]   12/14/2018

## 2018-12-14 NOTE — Patient Instructions (Signed)
Start lasix and potassium supplement every other day.

## 2019-01-07 NOTE — Progress Notes (Deleted)
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  INTERVAL HISTORY: Patient returns to clinic today for further follow-up and evaluation.  She has been evaluated by pulmonary and thoracic surgery in the interim.  She had a thoracentesis of approximately 300 to 350 mL that did not reveal any malignant cells.  Patient states he only mildly symptomatically improved with the fluid removal.  She currently feels well and is at her baseline.  She continues to have chronic shortness of breath requiring oxygen that is unchanged. She does not complain of increased cough, hemoptysis, or chest pain.  She has no neurologic complaints.  She denies any recent fevers or illnesses.  She has a fair appetite and denies weight loss.  She denies any nausea, vomiting, constipation, or diarrhea. She has no urinary complaints.  Patient offers no further specific complaints today.  REVIEW OF SYSTEMS:   Review of Systems  Constitutional: Positive for malaise/fatigue. Negative for fever and weight loss.  HENT: Negative for sore throat.   Eyes: Negative.   Respiratory: Positive for shortness of breath. Negative for cough and hemoptysis.   Cardiovascular: Negative.  Negative for chest pain and leg swelling.  Gastrointestinal: Negative for nausea and vomiting.  Genitourinary: Negative.  Negative for dysuria.  Musculoskeletal: Negative.  Negative for back pain.  Skin: Negative.   Neurological: Positive for sensory change and weakness. Negative for  dizziness, tingling and focal weakness.  Psychiatric/Behavioral: Negative.  Negative for depression. The patient is not nervous/anxious.     As per HPI. Otherwise, a complete review of systems is negative.  PAST MEDICAL HISTORY: Past Medical History:  Diagnosis Date  . Allergy   . Anemia    pernicious  . Anxiety   . Arthritis   . Atrophic vaginitis   . Back pain   . COPD (chronic obstructive pulmonary disease) ([place])   . Depression   . Diverticulosis   . Dyspnea   . Family history of adverse reaction to anesthesia    MOM-BUT UNSURE WHAT REACTION WAS  . Fatigue   . GERD (gastroesophageal reflux disease)   . H/O degenerative disc disease   . Hashimoto's disease   . History of chemotherapy 04/2016  . Hyperlipidemia   . Hypothyroid   . Insomnia   . Low back pain   . Lumbago   . Neuropathy due to chemotherapeutic drug ([place])   . Osteopenia   . Pneumonia 11/2016  . Small cell carcinoma of left lung ([place]) 01/2016   rad + chemo tx's  . Tobacco use   . Vitamin B12 deficiency     PAST SURGICAL HISTORY: Past Surgical History:  Procedure Laterality Date  . ABDOMINAL HYSTERECTOMY    . ANKLE SURGERY Left    x4  . CARPAL TUNNEL RELEASE Right 03/2014  . CHOLECYSTECTOMY    . ENDOBRONCHIAL ULTRASOUND N/A 01/07/2016   Procedure: ENDOBRONCHIAL ULTRASOUND;  Surgeon: Vilinda Boehringer, MD;  Location: [facility] ORS;  Service: Cardiopulmonary;  Laterality: N/A;  . ENDOBRONCHIAL ULTRASOUND N/A 09/16/2016   Procedure: ENDOBRONCHIAL ULTRASOUND;  Surgeon: Vilinda Boehringer, MD;  Location: [facility];  Service:  Cardiopulmonary;  Laterality: N/A;  . ENDOBRONCHIAL ULTRASOUND N/A 07/02/2017   Procedure: ENDOBRONCHIAL ULTRASOUND;  Surgeon: Laverle Hobby, MD;  Location: [facility];  Service: Pulmonary;  Laterality: N/A;  . HERNIA REPAIR    . PORT-A-CATH REMOVAL N/A 09/08/2017   Procedure: REMOVAL PORT-A-CATH;  Surgeon: [name], MD;  Location: [facility];  Service: General;  Laterality: N/A;  .  PORTACATH PLACEMENT Right 01/23/2016   Procedure: INSERTION PORT-A-CATH;  Surgeon: [name], MD;  Location: [facility];  Service: General;  Laterality: Right;    FAMILY HISTORY Family History  Problem Relation Age of Onset  . Emphysema Mother   . Diabetes Father        ADVANCED DIRECTIVES:    HEALTH MAINTENANCE: Social History   Tobacco Use  . Smoking status: Former Smoker    Packs/day: 1.00    Years: 30.00    Pack years: 30.00    Types: Cigarettes    Last attempt to quit: 01/11/2016    Years since quitting: 2.9  . Smokeless tobacco: Never Used  Substance Use Topics  . Alcohol use: No    Alcohol/week: 0.0 standard drinks  . Drug use: No     Allergies  Allergen Reactions  . Macrobid [Nitrofurantoin] Anaphylaxis  . Effexor [Venlafaxine] Other (See Comments)    UNKNOWN  . Hctz [Hydrochlorothiazide] Other (See Comments)    cramps  . Neurontin [Gabapentin] Other (See Comments)    UNKNOWN   . Paxil [Paroxetine Hcl] Other (See Comments)    UNKNOWN   . Pravachol [Pravastatin Sodium] Other (See Comments)    aching  . Prozac [Fluoxetine Hcl] Other (See Comments)    UNKNOWN   . Sulfur Itching and Other (See Comments)    "blisters in mouth"  . Wellbutrin [Bupropion] Itching  . Zoloft [Sertraline Hcl] Other (See Comments)    UNKNOWN     Current Outpatient Medications  Medication Sig Dispense Refill  . acetaminophen (TYLENOL) 500 MG tablet Take 1,000 mg by mouth daily as needed for mild pain.    Marland Kitchen albuterol (PROVENTIL HFA;VENTOLIN HFA) 108 (90 Base) MCG/ACT inhaler Inhale 2 puffs into the lungs every 6 (six) hours as needed for wheezing or shortness of breath. 1 Inhaler 2  . amitriptyline (ELAVIL) 10 MG tablet TAKE 2 TABLETS BY MOUTH AT BEDTIME 60 tablet 3  . amitriptyline (ELAVIL) 25 MG tablet Take 1-2 tablets at bedtime daily 60 tablet 5  . aspirin EC 81 MG tablet Take 81 mg by mouth daily.    . cyanocobalamin (,VITAMIN B-12,) 1000 MCG/ML injection INJECT 1  MILLILITER INTO THE MUSCLE ONCE 10 mL 1  . furosemide (LASIX) 20 MG tablet Take 1 tablet (20 mg total) by mouth every other day. 30 tablet 11  . ipratropium-albuterol (DUONEB) 0.5-2.5 (3) MG/3ML SOLN Take 3 mLs by nebulization every 6 (six) hours as needed. 360 mL 0  . levothyroxine (SYNTHROID, LEVOTHROID) 112 MCG tablet TAKE 1 TABLET ON AN EMPTY STOMACH WITH A GLASS OF WATER AT LEAST 30 TO 60 MINUTES BEFORE BREAKFAST. 30 tablet 5  . levothyroxine (SYNTHROID, LEVOTHROID) 125 MCG tablet TAKE 1 TABLET ON AN EMPTY STOMACH WITH A GLASS OF WATER AT LEAST 30 TO 60 MINUTES BEFORE BREAKFAST. 90 tablet 0  . potassium chloride (K-DUR) 10 MEQ tablet Take 1 tablet (10 mEq total) by mouth every other day. 30 tablet 11  . promethazine (PHENERGAN) 25 MG tablet Take 1 tablet (25 mg total) by mouth every 6 (six) hours as needed for nausea or vomiting. [place]  tablet 2  . ranitidine (ZANTAC) 150 MG tablet Take 150 mg by mouth 2 (two) times daily as needed for heartburn.    . simvastatin (ZOCOR) 10 MG tablet TAKE (1) TABLET BY MOUTH EVERY DAY AT 6:00 IN THE EVENING 30 tablet 12  . zolpidem (AMBIEN) 10 MG tablet TAKE ONE TABLET BY MOUTH DAILY AT BEDTIME AS NEEDED FOR SLEEP. 30 tablet 5   No current facility-administered medications for this visit.     OBJECTIVE: There were no vitals filed for this visit.   There is no height or weight on file to calculate BMI.    ECOG FS:2 - Symptomatic, <50% confined to bed  General: Well-developed, well-nourished, no acute distress.  Sitting in a wheelchair. Eyes: Pink conjunctiva, anicteric sclera. HEENT: Normocephalic, moist mucous membranes, clear oropharnyx. Lungs: Minimal left lung breath sounds. Heart: Regular rate and rhythm. No rubs, murmurs, or gallops. Abdomen: Soft, nontender, nondistended. No organomegaly noted, normoactive bowel sounds. Musculoskeletal: No edema, cyanosis, or clubbing. Neuro: Alert, answering all questions appropriately. Cranial nerves grossly  intact. Skin: No rashes or petechiae noted. Psych: Normal affect.  LAB RESULTS:  Lab Results  Component Value Date   NA 135 10/15/2018   K 4.4 10/15/2018   CL 96 10/15/2018   CO2 25 10/15/2018   GLUCOSE 92 10/15/2018   BUN 11 10/15/2018   CREATININE 0.71 10/15/2018   CALCIUM 9.1 10/15/2018   PROT 7.1 10/15/2018   ALBUMIN 3.9 10/15/2018   AST 21 10/15/2018   ALT 12 10/15/2018   ALKPHOS 109 10/15/2018   BILITOT 0.2 10/15/2018   GFRNONAA 92 10/15/2018   GFRAA 106 10/15/2018    Lab Results  Component Value Date   WBC 8.0 09/04/2017   NEUTROABS 5.9 09/04/2017   HGB 12.3 09/04/2017   HCT 36.1 09/04/2017   MCV 90.8 09/04/2017   PLT 277 09/04/2017     STUDIES: Dg Chest 2 View  Result Date: 12/15/2018 CLINICAL DATA:  Lung cancer.  Pleural effusion. EXAM: CHEST - 2 VIEW COMPARISON:  09/13/2018 FINDINGS: Left pleural effusion and volume loss in the left lung have improved. There is now some aeration of the left upper lobe. There is continued dense opacity within the left mid and left lower lung zone with shift of the mediastinum to the left. Right lung is clear. No pneumothorax. IMPRESSION: Improved aeration in the left lung.  Improved left pleural effusion. Electronically Signed   By: Marybelle Killings M.D.   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  PLAN:    1. 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  Previously, bronchoscopy and FNA results on September 16, 2016 were negative for malignancy.  Pleural fluid from her most recent paracentesis on September 13, 2018 did not reveal any malignant cells.  Appreciate thoracic surgery and pulmonology input.  Patient does not wish to pursue Pleurx catheter or pleurodesis.  Patient has  follow-up with pulmonology in approximately 4 to 6 weeks and will defer repeat bronchoscopy to them.  Return to clinic in 3 months with repeat imaging and further evaluation.  2.  History of pulmonary embolism: Likely secondary to increased sedentary lifestyle.  Patient completed 6 months of Eliquis.   3. Peripheral neuropathy: Patient does not  complain of this today.  Patient is no longer taking gabapentin. 4. Anemia: Resolved.  Patient's most recent hemoglobin was within normal limits.   5. Thyroid: Continue current dose of 125 g Synthroid. Will defer to primary care for adjustment of medications.  6. Pain: Patient does not complain of pain today.  Continue Percocet as needed. 7. Anxiety: Chronic and unchanged.  Continue Xanax as needed. 8.  Nausea: Patient does not complain of this today.  Continue Phenergan as needed.  I spent a total of 30 minutes face-to-face with the patient of which greater than 50% of the visit was spent in counseling and coordination of care as detailed above.  Patient expressed understanding and was in agreement with this plan. She also understands that She can call clinic at any time with any questions, concerns, or complaints.    Lloyd Huger, MD   01/07/2019 11:52 AM

## 2019-01-11 ENCOUNTER — Ambulatory Visit: Payer: Managed Care, Other (non HMO)

## 2019-01-11 ENCOUNTER — Other Ambulatory Visit: Payer: Managed Care, Other (non HMO)

## 2019-01-13 ENCOUNTER — Inpatient Hospital Stay: Payer: Managed Care, Other (non HMO) | Attending: Oncology

## 2019-01-13 ENCOUNTER — Ambulatory Visit
Admission: RE | Admit: 2019-01-13 | Discharge: 2019-01-13 | Disposition: A | Discharge status: Home / Self Care | Payer: Managed Care, Other (non HMO) | Source: Ambulatory Visit | Attending: Oncology | Admitting: Oncology

## 2019-01-13 DIAGNOSIS — F419 Anxiety disorder, unspecified: Secondary | ICD-10-CM | POA: Insufficient documentation

## 2019-01-13 DIAGNOSIS — Z7982 Long term (current) use of aspirin: Secondary | ICD-10-CM | POA: Diagnosis not present

## 2019-01-13 DIAGNOSIS — Z86711 Personal history of pulmonary embolism: Secondary | ICD-10-CM | POA: Diagnosis not present

## 2019-01-13 DIAGNOSIS — E039 Hypothyroidism, unspecified: Secondary | ICD-10-CM | POA: Insufficient documentation

## 2019-01-13 DIAGNOSIS — C3432 Malignant neoplasm of lower lobe, left bronchus or lung: Secondary | ICD-10-CM | POA: Diagnosis present

## 2019-01-13 DIAGNOSIS — Z79899 Other long term (current) drug therapy: Secondary | ICD-10-CM | POA: Insufficient documentation

## 2019-01-13 DIAGNOSIS — Z87891 Personal history of nicotine dependence: Secondary | ICD-10-CM | POA: Diagnosis not present

## 2019-01-13 DIAGNOSIS — Z01812 Encounter for preprocedural laboratory examination: Secondary | ICD-10-CM

## 2019-01-13 DIAGNOSIS — Z923 Personal history of irradiation: Secondary | ICD-10-CM | POA: Insufficient documentation

## 2019-01-13 DIAGNOSIS — C3490 Malignant neoplasm of unspecified part of unspecified bronchus or lung: Secondary | ICD-10-CM | POA: Diagnosis not present

## 2019-01-13 DIAGNOSIS — Z90710 Acquired absence of both cervix and uterus: Secondary | ICD-10-CM | POA: Insufficient documentation

## 2019-01-13 DIAGNOSIS — Z9221 Personal history of antineoplastic chemotherapy: Secondary | ICD-10-CM | POA: Diagnosis not present

## 2019-01-13 DIAGNOSIS — R937 Abnormal findings on diagnostic imaging of other parts of musculoskeletal system: Secondary | ICD-10-CM | POA: Insufficient documentation

## 2019-01-13 LAB — CREATININE, SERUM
Creatinine, Ser: 0.84 mg/dL (ref 0.44–1.00)
GFR calc Af Amer: >60 mL/min (ref >60)
GFR calc non Af Amer: >60 mL/min (ref >60)

## 2019-01-13 MED ORDER — IOHEXOL 300 MG/ML  SOLN
75.0000 mL | Freq: Once | INTRAMUSCULAR | Status: AC | PRN
  Administered 2019-01-13: 75 mL via INTRAVENOUS

## 2019-01-14 ENCOUNTER — Ambulatory Visit: Payer: Managed Care, Other (non HMO) | Admitting: Oncology

## 2019-01-14 ENCOUNTER — Inpatient Hospital Stay: Payer: Managed Care, Other (non HMO) | Admitting: Oncology

## 2019-01-14 ENCOUNTER — Inpatient Hospital Stay (HOSPITAL_BASED_OUTPATIENT_CLINIC_OR_DEPARTMENT_OTHER): Payer: Managed Care, Other (non HMO) | Admitting: Oncology

## 2019-01-14 ENCOUNTER — Other Ambulatory Visit: Payer: Self-pay

## 2019-01-14 ENCOUNTER — Encounter: Payer: Self-pay | Admitting: Oncology

## 2019-01-14 VITALS — BP 112/70 | HR 112 | Temp 97.6°F

## 2019-01-14 DIAGNOSIS — C3432 Malignant neoplasm of lower lobe, left bronchus or lung: Secondary | ICD-10-CM

## 2019-01-14 DIAGNOSIS — F419 Anxiety disorder, unspecified: Secondary | ICD-10-CM

## 2019-01-14 DIAGNOSIS — Z87891 Personal history of nicotine dependence: Secondary | ICD-10-CM

## 2019-01-14 DIAGNOSIS — Z90710 Acquired absence of both cervix and uterus: Secondary | ICD-10-CM

## 2019-01-14 DIAGNOSIS — Z86711 Personal history of pulmonary embolism: Secondary | ICD-10-CM | POA: Diagnosis not present

## 2019-01-14 DIAGNOSIS — Z7982 Long term (current) use of aspirin: Secondary | ICD-10-CM

## 2019-01-14 DIAGNOSIS — Z9221 Personal history of antineoplastic chemotherapy: Secondary | ICD-10-CM

## 2019-01-14 DIAGNOSIS — Z79899 Other long term (current) drug therapy: Secondary | ICD-10-CM

## 2019-01-14 DIAGNOSIS — Z923 Personal history of irradiation: Secondary | ICD-10-CM

## 2019-01-14 DIAGNOSIS — E039 Hypothyroidism, unspecified: Secondary | ICD-10-CM

## 2019-01-14 MED ORDER — ALPRAZOLAM 0.25 MG PO TABS: 0.2500 mg | ORAL_TABLET | Freq: Every evening | ORAL | 0 refills | Status: AC | PRN

## 2019-01-14 NOTE — Progress Notes (Signed)
Patient here today for follow up regarding lung cancer, CT results.

## 2019-01-14 NOTE — Progress Notes (Signed)
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  INTERVAL HISTORY: Patient returns to clinic today for further evaluation and discussion of her imaging results.  She continues to feel well and is at her baseline.  She has chronic shortness of breath requiring oxygen that is unchanged.  She also has chronic cough.  She has no neurologic complaints.  She denies any chest pain or hemoptysis.  She denies any recent fevers or illnesses.  She has a fair appetite and denies weight loss.  She denies any nausea, vomiting, constipation, or diarrhea. She has no urinary complaints.  Patient offers no further specific complaints today.  REVIEW OF SYSTEMS:   Review of Systems  Constitutional: Positive for malaise/fatigue. Negative for fever and weight loss.  HENT: Negative for sore throat.   Eyes: Negative.   Respiratory: Positive for shortness of breath. Negative for cough and hemoptysis.   Cardiovascular: Negative.  Negative for chest pain and leg swelling.  Gastrointestinal: Negative for nausea and vomiting.  Genitourinary: Negative.  Negative for dysuria.  Musculoskeletal: Negative.  Negative for back pain.  Skin: Negative.   Neurological: Positive for sensory change and weakness. Negative for dizziness, tingling and focal weakness.  Psychiatric/Behavioral: Negative.  Negative for depression. The patient is not nervous/anxious.     As per HPI. Otherwise, a complete review of systems is negative.  PAST MEDICAL HISTORY: Past  Medical History:  Diagnosis Date  . Allergy   . Anemia    pernicious  . Anxiety   . Arthritis   . Atrophic vaginitis   . Back pain   . COPD (chronic obstructive pulmonary disease) ([place])   . Depression   . Diverticulosis   . Dyspnea   . Family history of adverse reaction to anesthesia    MOM-BUT UNSURE WHAT REACTION WAS  . Fatigue   . GERD (gastroesophageal reflux disease)   . H/O degenerative disc disease   . Hashimoto's disease   . History of chemotherapy 04/2016  . Hyperlipidemia   . Hypothyroid   . Insomnia   . Low back pain   . Lumbago   . Neuropathy due to chemotherapeutic drug ([place])   . Osteopenia   . Pneumonia 11/2016  . Small cell carcinoma of left lung ([place]) 01/2016   rad + chemo tx's  . Tobacco use   . Vitamin B12 deficiency     PAST SURGICAL HISTORY: Past Surgical History:  Procedure Laterality Date  . ABDOMINAL HYSTERECTOMY    . ANKLE SURGERY Left    x4  . CARPAL TUNNEL RELEASE Right 03/2014  . CHOLECYSTECTOMY    . ENDOBRONCHIAL ULTRASOUND N/A 01/07/2016   Procedure: ENDOBRONCHIAL ULTRASOUND;  Surgeon: Vilinda Boehringer, MD;  Location: [facility];  Service: Cardiopulmonary;  Laterality: N/A;  . ENDOBRONCHIAL ULTRASOUND N/A 09/16/2016   Procedure: ENDOBRONCHIAL ULTRASOUND;  Surgeon: Vilinda Boehringer, MD;  Location: [facility];  Service: Cardiopulmonary;  Laterality: N/A;  . ENDOBRONCHIAL ULTRASOUND N/A 07/02/2017   Procedure: ENDOBRONCHIAL ULTRASOUND;  Surgeon: Laverle Hobby, MD;  Location: [facility];  Service: Pulmonary;  Laterality: N/A;  . HERNIA REPAIR    .  PORT-A-CATH REMOVAL N/A 09/08/2017   Procedure: REMOVAL PORT-A-CATH;  Surgeon: [name], MD;  Location: [facility];  Service: General;  Laterality: N/A;  . PORTACATH PLACEMENT Right 01/23/2016   Procedure: INSERTION PORT-A-CATH;  Surgeon: [name], MD;  Location: [facility];  Service: General;  Laterality: Right;    FAMILY HISTORY Family History  Problem Relation Age of Onset  . Emphysema  Mother   . Diabetes Father        ADVANCED DIRECTIVES:    HEALTH MAINTENANCE: Social History   Tobacco Use  . Smoking status: Former Smoker    Packs/day: 1.00    Years: 30.00    Pack years: 30.00    Types: Cigarettes    Last attempt to quit: 01/11/2016    Years since quitting: 3.0  . Smokeless tobacco: Never Used  Substance Use Topics  . Alcohol use: No    Alcohol/week: 0.0 standard drinks  . Drug use: No     Allergies  Allergen Reactions  . Macrobid [Nitrofurantoin] Anaphylaxis  . Effexor [Venlafaxine] Other (See Comments)    UNKNOWN  . Hctz [Hydrochlorothiazide] Other (See Comments)    cramps  . Neurontin [Gabapentin] Other (See Comments)    UNKNOWN   . Paxil [Paroxetine Hcl] Other (See Comments)    UNKNOWN   . Pravachol [Pravastatin Sodium] Other (See Comments)    aching  . Prozac [Fluoxetine Hcl] Other (See Comments)    UNKNOWN   . Sulfur Itching and Other (See Comments)    "blisters in mouth"  . Wellbutrin [Bupropion] Itching  . Zoloft [Sertraline Hcl] Other (See Comments)    UNKNOWN     Current Outpatient Medications  Medication Sig Dispense Refill  . acetaminophen (TYLENOL) 500 MG tablet Take 1,000 mg by mouth daily as needed for mild pain.    Marland Kitchen albuterol (PROVENTIL HFA;VENTOLIN HFA) 108 (90 Base) MCG/ACT inhaler Inhale 2 puffs into the lungs every 6 (six) hours as needed for wheezing or shortness of breath. 1 Inhaler 2  . amitriptyline (ELAVIL) 10 MG tablet TAKE 2 TABLETS BY MOUTH AT BEDTIME 60 tablet 3  . amitriptyline (ELAVIL) 25 MG tablet Take 1-2 tablets at bedtime daily 60 tablet 5  . aspirin EC 81 MG tablet Take 81 mg by mouth daily.    . cyanocobalamin (,VITAMIN B-12,) 1000 MCG/ML injection INJECT 1 MILLILITER INTO THE MUSCLE ONCE 10 mL 1  . furosemide (LASIX) 20 MG tablet Take 1 tablet (20 mg total) by mouth every other day. 30 tablet 11  . ipratropium-albuterol (DUONEB) 0.5-2.5 (3) MG/3ML SOLN Take 3 mLs by nebulization every 6 (six) hours as  needed. 360 mL 0  . levothyroxine (SYNTHROID, LEVOTHROID) 112 MCG tablet TAKE 1 TABLET ON AN EMPTY STOMACH WITH A GLASS OF WATER AT LEAST 30 TO 60 MINUTES BEFORE BREAKFAST. 30 tablet 5  . levothyroxine (SYNTHROID, LEVOTHROID) 125 MCG tablet TAKE 1 TABLET ON AN EMPTY STOMACH WITH A GLASS OF WATER AT LEAST 30 TO 60 MINUTES BEFORE BREAKFAST. 90 tablet 0  . potassium chloride (K-DUR) 10 MEQ tablet Take 1 tablet (10 mEq total) by mouth every other day. 30 tablet 11  . promethazine (PHENERGAN) 25 MG tablet Take 1 tablet (25 mg total) by mouth every 6 (six) hours as needed for nausea or vomiting. 30 tablet 2  . ranitidine (ZANTAC) 150 MG tablet Take 150 mg by mouth 2 (two) times daily as needed for heartburn.    . simvastatin (ZOCOR) 10 MG tablet TAKE (1) TABLET BY MOUTH EVERY DAY  AT 6:00 IN THE EVENING 30 tablet 12  . zolpidem (AMBIEN) 10 MG tablet TAKE ONE TABLET BY MOUTH DAILY AT BEDTIME AS NEEDED FOR SLEEP. 30 tablet 5   No current facility-administered medications for this visit.     OBJECTIVE: Vitals:   01/14/19 1320  BP: 112/70  Pulse: (!) 112  Temp: 97.6 F (36.4 C)  SpO2: 99%     There is no height or weight on file to calculate BMI.    ECOG FS:2 - Symptomatic, <50% confined to bed  General: Well-developed, well-nourished, no acute distress.  Sitting in a wheelchair. Eyes: Pink conjunctiva, anicteric sclera. HEENT: Normocephalic, moist mucous membranes. Lungs: Diminished breath sounds on left. Heart: Regular rate and rhythm. No rubs, murmurs, or gallops. Abdomen: Soft, nontender, nondistended. No organomegaly noted, normoactive bowel sounds. Musculoskeletal: No edema, cyanosis, or clubbing. Neuro: Alert, answering all questions appropriately. Cranial nerves grossly intact. Skin: No rashes or petechiae noted. Psych: Normal affect.  LAB RESULTS:  Lab Results  Component Value Date   NA 135 10/15/2018   K 4.4 10/15/2018   CL 96 10/15/2018   CO2 25 10/15/2018   GLUCOSE 92  10/15/2018   BUN 11 10/15/2018   CREATININE 0.84 01/13/2019   CALCIUM 9.1 10/15/2018   PROT 7.1 10/15/2018   ALBUMIN 3.9 10/15/2018   AST 21 10/15/2018   ALT 12 10/15/2018   ALKPHOS 109 10/15/2018   BILITOT 0.2 10/15/2018   GFRNONAA >60 01/13/2019   GFRAA >60 01/13/2019    Lab Results  Component Value Date   WBC 8.0 09/04/2017   NEUTROABS 5.9 09/04/2017   HGB 12.3 09/04/2017   HCT 36.1 09/04/2017   MCV 90.8 09/04/2017   PLT 277 09/04/2017     STUDIES: Ct Chest W Contrast  Result Date: 01/13/2019 CLINICAL DATA:  Small cell lung cancer. EXAM: CT CHEST WITH CONTRAST TECHNIQUE: Multidetector CT imaging of the chest was performed during intravenous contrast administration. CONTRAST:  67mL OMNIPAQUE IOHEXOL 300 MG/ML  SOLN COMPARISON:  08/25/2018 FINDINGS: Cardiovascular: The heart size appears normal. There is a moderate pericardial effusion which appears similar in volume to the previous exam. The effusion appears to communicate with left lung mass, image 58/2 and image 56/5. Aortic atherosclerosis. Calcifications within the LAD and RCA coronary artery noted. Mediastinum/Nodes: No axillary or supraclavicular adenopathy. No mediastinal adenopathy identified. The trachea appears patent and is midline. Normal appearance of the esophagus. Lungs/Pleura: There is diffuse volume loss within the left hemithorax. Previously noted large left pleural effusion now appears loculated and is slightly decreased in volume from previous exam. There is new diffuse pleural enhancement and thickening. Subtle pleural nodularity is identified. For example, image 100/2. Left masslike area of architectural distortion and volume loss appears increased in size from previous exam. On today's study this measures 4.0 by 4.7 by 6.5 cm. There is near complete occlusion of the left upper lobe and lingular branches of the pulmonary artery. The lingular and left lower lobe bronchi appear occluded. The right lung appears well  aerated. A few small nodules are identified within the right upper lobe which are nonspecific. This includes a lateral right upper lobe nodule measuring 3 mm, image 40/3. Upper Abdomen: No acute abnormality. Musculoskeletal: No aggressive lytic or sclerotic bone lesions. IMPRESSION: 1. Large area of masslike architectural distortion within the left upper lobe and left perihilar region is identified and is increased in size from previous exam. There is obstruction of the lingular and right lower lobe bronchus and marked narrowing of  the left upper lobe pulmonary artery. Imaging findings are concerning for progress in of disease. Consider further evaluation with PET-CT. 2. Interval loculation of previous left pleural effusion. There is new mild diffuse left lung pleural enhancement and thickening with subtle nodularity concerning for malignant pleural effusion. This could be better assessed with diagnostic thoracentesis. 3. Aortic atherosclerosis with coronary artery atherosclerotic calcifications. Aortic Atherosclerosis (ICD10-I70.0). 4. Pericardial effusion. Can not rule out malignant pericardial effusion. Electronically Signed   By: Kerby Moors M.D.   On: 01/13/2019 16:25    ASSESSMENT: Clinical stage IIa, T2 N1 M0 small cell lung cancer of the bronchus of left lower lobe.  PLAN:    1. 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  Previously, bronchoscopy and FNA results on September 16, 2016 were negative for malignancy. Pleural fluid from her most recent paracentesis on September 13, 2018 did not reveal any malignant cells.  Appreciate thoracic surgery and pulmonology input.  Patient declined Pleurx catheter or pleurodesis.  We will get a PET scan in the next week to further evaluate.  Return to clinic in 1 week to discuss the results.  Patient has  also been instructed to keep her follow-up with pulmonary as scheduled.  2.  History of pulmonary embolism: Likely secondary to increased sedentary lifestyle.  Patient completed 6 months of Eliquis.   3. Peripheral neuropathy: Patient does not complain of this today.  Patient is no longer taking gabapentin. 4. Anemia: Resolved.  Patient's most recent hemoglobin was within normal limits.   5. Thyroid: Continue current dose of 125 g Synthroid. Will defer to primary care for adjustment of medications.  6. Pain: Patient does not complain of pain today.  Continue Percocet as needed. 7. Anxiety: Chronic and unchanged.  Continue Xanax as needed.  Patient was given a refill today. 8.  Nausea: Patient does not complain of this today.  Continue Phenergan as needed.  I spent a total of 30 minutes face-to-face with the patient of which greater than 50% of the visit was spent in counseling and coordination of care as detailed above.  Patient expressed understanding and was in agreement with this plan. She also understands that She can call clinic at any time with any questions, concerns, or complaints.    Lloyd Huger, MD   01/14/2019 1:43 PM

## 2019-01-18 ENCOUNTER — Ambulatory Visit: Payer: Managed Care, Other (non HMO) | Admitting: Internal Medicine

## 2019-01-20 ENCOUNTER — Ambulatory Visit: Payer: Managed Care, Other (non HMO)

## 2019-01-21 ENCOUNTER — Ambulatory Visit: Payer: Managed Care, Other (non HMO) | Admitting: Oncology

## 2019-01-23 NOTE — Progress Notes (Signed)
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  INTERVAL HISTORY: Patient returns to clinic today for further evaluation and discussion of her PET scan results.  She continues to have chronic shortness of breath and requires oxygen 24 hours a day, but otherwise feels well.  She does not complain of increased cough, hemoptysis, or chest pain.  She has no neurologic complaints.  She denies any recent fevers or illnesses.  She has a fair appetite and denies weight loss.  She denies any nausea, vomiting, constipation, or diarrhea. She has no urinary complaints.  Patient feels at her baseline offers no further specific complaints today.  REVIEW OF SYSTEMS:   Review of Systems  Constitutional: Positive for malaise/fatigue. Negative for fever and weight loss.  HENT: Negative for sore throat.   Eyes: Negative.   Respiratory: Positive for shortness of breath. Negative for cough and hemoptysis.   Cardiovascular: Negative.  Negative for chest pain and leg swelling.  Gastrointestinal: Negative for nausea and vomiting.  Genitourinary: Negative.  Negative for dysuria.  Musculoskeletal: Negative.  Negative for back pain.  Skin: Negative.   Neurological: Positive for weakness. Negative for dizziness, tingling, sensory change and focal weakness.  Psychiatric/Behavioral: Negative.  Negative for depression. The patient is not nervous/anxious.     As per HPI. Otherwise, a complete review of systems is negative.  PAST MEDICAL  HISTORY: Past Medical History:  Diagnosis Date  . Allergy   . Anemia    pernicious  . Anxiety   . Arthritis   . Atrophic vaginitis   . Back pain   . COPD (chronic obstructive pulmonary disease) ([place])   . Depression   . Diverticulosis   . Dyspnea   . Family history of adverse reaction to anesthesia    MOM-BUT UNSURE WHAT REACTION WAS  . Fatigue   . GERD (gastroesophageal reflux disease)   . H/O degenerative disc disease   . Hashimoto's disease   . History of chemotherapy 04/2016  . Hyperlipidemia   . Hypothyroid   . Insomnia   . Low back pain   . Lumbago   . Neuropathy due to chemotherapeutic drug ([place])   . Osteopenia   . Pneumonia 11/2016  . Small cell carcinoma of left lung ([place]) 01/2016   rad + chemo tx's  . Tobacco use   . Vitamin B12 deficiency     PAST SURGICAL HISTORY: Past Surgical History:  Procedure Laterality Date  . ABDOMINAL HYSTERECTOMY    . ANKLE SURGERY Left    x4  . CARPAL TUNNEL RELEASE Right 03/2014  . CHOLECYSTECTOMY    . ENDOBRONCHIAL ULTRASOUND N/A 01/07/2016   Procedure: ENDOBRONCHIAL ULTRASOUND;  Surgeon: Vilinda Boehringer, MD;  Location: [facility] ORS;  Service: Cardiopulmonary;  Laterality: N/A;  . ENDOBRONCHIAL ULTRASOUND N/A 09/16/2016   Procedure: ENDOBRONCHIAL ULTRASOUND;  Surgeon: Vilinda Boehringer, MD;  Location: [facility];  Service: Cardiopulmonary;  Laterality: N/A;  . ENDOBRONCHIAL ULTRASOUND N/A 07/02/2017   Procedure: ENDOBRONCHIAL ULTRASOUND;  Surgeon: Laverle Hobby, MD;  Location: [facility];  Service: Pulmonary;  Laterality: N/A;  . HERNIA REPAIR    .  PORT-A-CATH REMOVAL N/A 09/08/2017   Procedure: REMOVAL PORT-A-CATH;  Surgeon: [name], MD;  Location: [facility];  Service: General;  Laterality: N/A;  . PORTACATH PLACEMENT Right 01/23/2016   Procedure: INSERTION PORT-A-CATH;  Surgeon: [name], MD;  Location: [facility];  Service: General;  Laterality: Right;    FAMILY HISTORY Family History  Problem Relation Age of Onset  .  Emphysema Mother   . Diabetes Father        ADVANCED DIRECTIVES:    HEALTH MAINTENANCE: Social History   Tobacco Use  . Smoking status: Former Smoker    Packs/day: 1.00    Years: 30.00    Pack years: 30.00    Types: Cigarettes    Last attempt to quit: 01/11/2016    Years since quitting: 3.0  . Smokeless tobacco: Never Used  Substance Use Topics  . Alcohol use: No    Alcohol/week: 0.0 standard drinks  . Drug use: No     Allergies  Allergen Reactions  . Macrobid [Nitrofurantoin] Anaphylaxis  . Effexor [Venlafaxine] Other (See Comments)    UNKNOWN  . Hctz [Hydrochlorothiazide] Other (See Comments)    cramps  . Neurontin [Gabapentin] Other (See Comments)    UNKNOWN   . Paxil [Paroxetine Hcl] Other (See Comments)    UNKNOWN   . Pravachol [Pravastatin Sodium] Other (See Comments)    aching  . Prozac [Fluoxetine Hcl] Other (See Comments)    UNKNOWN   . Sulfur Itching and Other (See Comments)    "blisters in mouth"  . Wellbutrin [Bupropion] Itching  . Zoloft [Sertraline Hcl] Other (See Comments)    UNKNOWN     Current Outpatient Medications  Medication Sig Dispense Refill  . acetaminophen (TYLENOL) 500 MG tablet Take 1,000 mg by mouth daily as needed for mild pain.    Marland Kitchen albuterol (PROVENTIL HFA;VENTOLIN HFA) 108 (90 Base) MCG/ACT inhaler Inhale 2 puffs into the lungs every 6 (six) hours as needed for wheezing or shortness of breath. 1 Inhaler 2  . ALPRAZolam (XANAX) 0.25 MG tablet Take 1 tablet (0.25 mg total) by mouth at bedtime as needed for anxiety. 30 tablet 0  . amitriptyline (ELAVIL) 10 MG tablet TAKE 2 TABLETS BY MOUTH AT BEDTIME 60 tablet 3  . amitriptyline (ELAVIL) 25 MG tablet Take 1-2 tablets at bedtime daily 60 tablet 5  . aspirin EC 81 MG tablet Take 81 mg by mouth daily.    . cyanocobalamin (,VITAMIN B-12,) 1000 MCG/ML injection INJECT 1 MILLILITER INTO THE MUSCLE ONCE 10 mL 1  . furosemide (LASIX) 20 MG tablet Take 1 tablet (20 mg total) by mouth every  other day. 30 tablet 11  . ipratropium-albuterol (DUONEB) 0.5-2.5 (3) MG/3ML SOLN Take 3 mLs by nebulization every 6 (six) hours as needed. 360 mL 0  . levothyroxine (SYNTHROID, LEVOTHROID) 112 MCG tablet TAKE 1 TABLET ON AN EMPTY STOMACH WITH A GLASS OF WATER AT LEAST 30 TO 60 MINUTES BEFORE BREAKFAST. 30 tablet 5  . levothyroxine (SYNTHROID, LEVOTHROID) 125 MCG tablet TAKE 1 TABLET ON AN EMPTY STOMACH WITH A GLASS OF WATER AT LEAST 30 TO 60 MINUTES BEFORE BREAKFAST. 90 tablet 0  . potassium chloride (K-DUR) 10 MEQ tablet Take 1 tablet (10 mEq total) by mouth every other day. 30 tablet 11  . promethazine (PHENERGAN) 25 MG tablet Take 1 tablet (25 mg total) by mouth every 6 (six) hours as needed for nausea or vomiting. 30 tablet 2  . ranitidine (ZANTAC) 150 MG tablet Take 150 mg by mouth  2 (two) times daily as needed for heartburn.    . simvastatin (ZOCOR) 10 MG tablet TAKE (1) TABLET BY MOUTH EVERY DAY AT 6:00 IN THE EVENING 30 tablet 12  . zolpidem (AMBIEN) 10 MG tablet TAKE ONE TABLET BY MOUTH DAILY AT BEDTIME AS NEEDED FOR SLEEP. 30 tablet 5   No current facility-administered medications for this visit.     OBJECTIVE: Vitals:   01/27/19 1005  BP: 138/90  Pulse: (!) 115  Temp: 97.8 F (36.6 C)  SpO2: 98%     Body mass index is 27.86 kg/m.    ECOG FS:1 - Symptomatic but completely ambulatory  General: Well-developed, well-nourished, no acute distress.  Sitting in a wheelchair. Eyes: Pink conjunctiva, anicteric sclera. HEENT: Normocephalic, moist mucous membranes, clear oropharnyx. Lungs: Minimal left breath sounds. Heart: Regular rate and rhythm. No rubs, murmurs, or gallops. Abdomen: Soft, nontender, nondistended. No organomegaly noted, normoactive bowel sounds. Musculoskeletal: No edema, cyanosis, or clubbing. Neuro: Alert, answering all questions appropriately. Cranial nerves grossly intact. Skin: No rashes or petechiae noted. Psych: Normal affect.  LAB RESULTS:  Lab Results   Component Value Date   NA 135 10/15/2018   K 4.4 10/15/2018   CL 96 10/15/2018   CO2 25 10/15/2018   GLUCOSE 92 10/15/2018   BUN 11 10/15/2018   CREATININE 0.84 01/13/2019   CALCIUM 9.1 10/15/2018   PROT 7.1 10/15/2018   ALBUMIN 3.9 10/15/2018   AST 21 10/15/2018   ALT 12 10/15/2018   ALKPHOS 109 10/15/2018   BILITOT 0.2 10/15/2018   GFRNONAA >60 01/13/2019   GFRAA >60 01/13/2019    Lab Results  Component Value Date   WBC 8.0 09/04/2017   NEUTROABS 5.9 09/04/2017   HGB 12.3 09/04/2017   HCT 36.1 09/04/2017   MCV 90.8 09/04/2017   PLT 277 09/04/2017     STUDIES: Ct Chest W Contrast  Result Date: 01/13/2019 CLINICAL DATA:  Small cell lung cancer. EXAM: CT CHEST WITH CONTRAST TECHNIQUE: Multidetector CT imaging of the chest was performed during intravenous contrast administration. CONTRAST:  54mL OMNIPAQUE IOHEXOL 300 MG/ML  SOLN COMPARISON:  08/25/2018 FINDINGS: Cardiovascular: The heart size appears normal. There is a moderate pericardial effusion which appears similar in volume to the previous exam. The effusion appears to communicate with left lung mass, image 58/2 and image 56/5. Aortic atherosclerosis. Calcifications within the LAD and RCA coronary artery noted. Mediastinum/Nodes: No axillary or supraclavicular adenopathy. No mediastinal adenopathy identified. The trachea appears patent and is midline. Normal appearance of the esophagus. Lungs/Pleura: There is diffuse volume loss within the left hemithorax. Previously noted large left pleural effusion now appears loculated and is slightly decreased in volume from previous exam. There is new diffuse pleural enhancement and thickening. Subtle pleural nodularity is identified. For example, image 100/2. Left masslike area of architectural distortion and volume loss appears increased in size from previous exam. On today's study this measures 4.0 by 4.7 by 6.5 cm. There is near complete occlusion of the left upper lobe and lingular  branches of the pulmonary artery. The lingular and left lower lobe bronchi appear occluded. The right lung appears well aerated. A few small nodules are identified within the right upper lobe which are nonspecific. This includes a lateral right upper lobe nodule measuring 3 mm, image 40/3. Upper Abdomen: No acute abnormality. Musculoskeletal: No aggressive lytic or sclerotic bone lesions. IMPRESSION: 1. Large area of masslike architectural distortion within the left upper lobe and left perihilar region is identified and is increased in  size from previous exam. There is obstruction of the lingular and right lower lobe bronchus and marked narrowing of the left upper lobe pulmonary artery. Imaging findings are concerning for progress in of disease. Consider further evaluation with PET-CT. 2. Interval loculation of previous left pleural effusion. There is new mild diffuse left lung pleural enhancement and thickening with subtle nodularity concerning for malignant pleural effusion. This could be better assessed with diagnostic thoracentesis. 3. Aortic atherosclerosis with coronary artery atherosclerotic calcifications. Aortic Atherosclerosis (ICD10-I70.0). 4. Pericardial effusion. Can not rule out malignant pericardial effusion. Electronically Signed   By: Kerby Moors M.D.   On: 01/13/2019 16:25   Nm Pet Image Restag (ps) Skull Base To Thigh  Result Date: 01/26/2019 CLINICAL DATA:  Subsequent treatment strategy for lung carcinoma. EXAM: NUCLEAR MEDICINE PET SKULL BASE TO THIGH TECHNIQUE: 8.0 mCi F-18 FDG was injected intravenously. Full-ring PET imaging was performed from the skull base to thigh after the radiotracer. CT data was obtained and used for attenuation correction and anatomic localization. Fasting blood glucose: 74 mg/dl COMPARISON:  CT 01/13/2019, PET-CT 05/26/2016, 01/22/2016 FINDINGS: Mediastinal blood pool activity: SUV max 2.54 NECK: No hypermetabolic lymph nodes in the neck. Incidental CT  findings: none CHEST: There is occlusion of the LEFT mainstem bronchus. The LEFT lung is completely collapsed. There is intense metabolic activity associated the lingual lobe of the lung as well as centrally in the LEFT lower lobe. The left upper lobe lung is not hypermetabolic. The hypermetabolic region of the lingula measures approximately 6.6 x 4.0 cm. Activity is intense with SUV max equal 19.4. No hypermetabolic mediastinal lymph nodes. No hypermetabolic nodules in the RIGHT lung. No hypermetabolic supraclavicular nodes. Hypermetabolic lingular lobe does not extend beyond the chest wall. Incidental CT findings: A small pericardial effusion. ABDOMEN/PELVIS: No abnormal hypermetabolic activity within the liver, pancreas, adrenal glands, or spleen. No hypermetabolic lymph nodes in the abdomen or pelvis. Incidental CT findings: none SKELETON: Focus intense metabolic activity localizing to the RIGHT iliac wing with SUV max equal 12.8. There is a small lytic lesion accompanying this metabolic activity measuring approximately 10 mm (image 203/3). Incidental CT findings: none IMPRESSION: 1. Complete collapse of the LEFT lung secondary to obstructing lesion in the LEFT mainstem bronchus. 2. Hypermetabolic mass in the lingula consistent with tumor recurrence. 3. No evidence of metastatic mediastinal nodal metastasis. 4. 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 5. Small to moderate pericardial effusion again noted. Electronically Signed   By: Suzy Bouchard M.D.   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  PLAN:    1. 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  Bronchoscopy and FNA results on September 16, 2016 were negative for malignancy.  Pleural fluid from her most recent paracentesis on September 13, 2018 did not reveal any malignant cells.  Appreciate thoracic surgery and pulmonology input.  Previously, patient did not wish to pursue Pleurx catheter or pleurodesis.  PET scan results from January 26, 2019 reviewed independently and reported as above with complete collapse of left lung secondary to obstructing lesion.  Hypermetabolic mass in the lingula consistent with tumor recurrence patient also noted to have a solitary skeletal metastasis in right iliac wing.  Case was discussed at cancer conference and plan is to do a CT-guided  biopsy of skeletal metastasis and left ilium.  If this is negative, will pursue a second biopsy of lingular mass.  Patient will return to clinic 1 week after her biopsies. 2.  History of pulmonary embolism: Likely secondary to increased sedentary lifestyle.  Patient completed 6 months of Eliquis.   3. Peripheral neuropathy: Patient does not complain of this today.  Patient is no longer taking gabapentin. 4. Anemia: Resolved.  Patient's most recent hemoglobin was within normal limits.   5. Thyroid: Continue current dose of 125 g Synthroid. Will defer to primary care for adjustment of medications.  6. Pain: Patient does not complain of pain today.  Continue Percocet as needed. 7. Anxiety: Chronic and unchanged.  Continue Xanax as needed. 8.  Nausea: Patient does not complain of this today.  Continue Phenergan as needed. 9.  Shortness of breath: Continue oxygen as prescribed.  Patient expressed understanding and was in agreement with this plan. She also understands that She can call clinic at any time with any questions, concerns, or complaints.    Lloyd Huger, MD   01/27/2019 4:39 PM

## 2019-01-26 ENCOUNTER — Encounter
Admission: RE | Admit: 2019-01-26 | Discharge: 2019-01-26 | Disposition: A | Discharge status: Home / Self Care | Payer: Managed Care, Other (non HMO) | Source: Ambulatory Visit | Attending: Oncology | Admitting: Oncology

## 2019-01-26 DIAGNOSIS — C3432 Malignant neoplasm of lower lobe, left bronchus or lung: Secondary | ICD-10-CM | POA: Diagnosis present

## 2019-01-26 LAB — GLUCOSE, CAPILLARY: Glucose-Capillary: 74 mg/dL (ref 70–99)

## 2019-01-26 MED ORDER — FLUDEOXYGLUCOSE F - 18 (FDG) INJECTION
7.7000 | Freq: Once | INTRAVENOUS | Status: AC | PRN
  Administered 2019-01-26: 8 via INTRAVENOUS

## 2019-01-27 ENCOUNTER — Inpatient Hospital Stay (HOSPITAL_BASED_OUTPATIENT_CLINIC_OR_DEPARTMENT_OTHER): Payer: Managed Care, Other (non HMO) | Admitting: Oncology

## 2019-01-27 ENCOUNTER — Other Ambulatory Visit: Payer: Self-pay

## 2019-01-27 ENCOUNTER — Inpatient Hospital Stay: Payer: Managed Care, Other (non HMO)

## 2019-01-27 VITALS — BP 138/90 | HR 115 | Temp 97.8°F | Ht 63.0 in | Wt 157.3 lb

## 2019-01-27 DIAGNOSIS — Z87891 Personal history of nicotine dependence: Secondary | ICD-10-CM

## 2019-01-27 DIAGNOSIS — Z7982 Long term (current) use of aspirin: Secondary | ICD-10-CM

## 2019-01-27 DIAGNOSIS — E039 Hypothyroidism, unspecified: Secondary | ICD-10-CM

## 2019-01-27 DIAGNOSIS — R937 Abnormal findings on diagnostic imaging of other parts of musculoskeletal system: Secondary | ICD-10-CM

## 2019-01-27 DIAGNOSIS — Z923 Personal history of irradiation: Secondary | ICD-10-CM

## 2019-01-27 DIAGNOSIS — F419 Anxiety disorder, unspecified: Secondary | ICD-10-CM | POA: Diagnosis not present

## 2019-01-27 DIAGNOSIS — C3432 Malignant neoplasm of lower lobe, left bronchus or lung: Secondary | ICD-10-CM | POA: Diagnosis not present

## 2019-01-27 DIAGNOSIS — Z9221 Personal history of antineoplastic chemotherapy: Secondary | ICD-10-CM

## 2019-01-27 DIAGNOSIS — Z86711 Personal history of pulmonary embolism: Secondary | ICD-10-CM

## 2019-01-27 DIAGNOSIS — Z79899 Other long term (current) drug therapy: Secondary | ICD-10-CM

## 2019-01-27 DIAGNOSIS — Z90710 Acquired absence of both cervix and uterus: Secondary | ICD-10-CM

## 2019-01-27 NOTE — Progress Notes (Signed)
Tumor Board Documentation  Jill Gross was presented by Dr [name] at our Tumor Board on 01/27/2019, which included representatives from medical oncology, radiation oncology, surgical, radiology, pathology, navigation, internal medicine, research, palliative care, pulmonology.  Jill Gross currently presents as a current patient, for discussion, for [place], for progression with history of the following treatments: active survellience, surgical intervention(s), neoadjuvant chemoradiation.  Additionally, we reviewed previous medical and familial history, history of present illness, and recent lab results along with all available histopathologic and imaging studies. The tumor board considered available treatment options and made the following recommendations: Biopsy CT biopsy of Iliac lesion, Lung biopsy if that is negative, XRT if it s positive  The following procedures/referrals were also placed: No orders of the defined types were placed in this encounter.   Clinical Trial Status: not discussed   Staging used: AJCC Stage Group  AJCC Staging:       Group: Small Cell Lung Cancer  National site-specific guidelines   were discussed with respect to the case.  Tumor board is a meeting of clinicians from various specialty areas who evaluate and discuss patients for whom a multidisciplinary approach is being considered. Final determinations in the plan of care are those of the provider(s). The responsibility for follow up of recommendations given during tumor board is that of the provider.   Today's extended care, comprehensive team conference, [name] was not present for the discussion and was not examined.   Multidisciplinary Tumor Board is a multidisciplinary case peer review process.  Decisions discussed in the Multidisciplinary Tumor Board reflect the opinions of the specialists present at the conference without having examined the patient.  Ultimately, treatment and diagnostic decisions rest  with the primary provider(s) and the patient.

## 2019-01-27 NOTE — Progress Notes (Signed)
Patient is here today on her cancer of bronchus of left lower lobe. Patient stated that she's had some pain on her left lung at times. Patient stated that she has not had no SOB with her oxygen on.

## 2019-01-31 ENCOUNTER — Other Ambulatory Visit: Payer: Self-pay | Admitting: *Deleted

## 2019-01-31 ENCOUNTER — Telehealth: Payer: Self-pay | Admitting: *Deleted

## 2019-01-31 DIAGNOSIS — C3490 Malignant neoplasm of unspecified part of unspecified bronchus or lung: Secondary | ICD-10-CM

## 2019-01-31 NOTE — Telephone Encounter (Signed)
I reached out to patient and husband as well regarding follow up and treatment planning. Patients case was discussed at tumor conference on 2/20. It was decided to proceed with CT guided biopsy of lesion on right iliac wing. I have discussed with patients husband plan for CT guided biopsy. It was explained that treatment and follow up depend on results of the biopsy There is a chance that lung biopsy may have to be performed depending on final pathology results. P

## 2019-02-08 ENCOUNTER — Other Ambulatory Visit: Payer: Self-pay | Admitting: Student

## 2019-02-10 ENCOUNTER — Ambulatory Visit
Admission: RE | Admit: 2019-02-10 | Discharge: 2019-02-10 | Disposition: A | Discharge status: Home / Self Care | Payer: Managed Care, Other (non HMO) | Source: Ambulatory Visit | Attending: Oncology | Admitting: Oncology

## 2019-02-10 ENCOUNTER — Other Ambulatory Visit: Payer: Self-pay

## 2019-02-10 DIAGNOSIS — C3490 Malignant neoplasm of unspecified part of unspecified bronchus or lung: Secondary | ICD-10-CM | POA: Diagnosis not present

## 2019-02-10 DIAGNOSIS — E785 Hyperlipidemia, unspecified: Secondary | ICD-10-CM | POA: Diagnosis not present

## 2019-02-10 DIAGNOSIS — Z87891 Personal history of nicotine dependence: Secondary | ICD-10-CM | POA: Diagnosis not present

## 2019-02-10 DIAGNOSIS — J449 Chronic obstructive pulmonary disease, unspecified: Secondary | ICD-10-CM | POA: Diagnosis not present

## 2019-02-10 DIAGNOSIS — Z882 Allergy status to sulfonamides status: Secondary | ICD-10-CM | POA: Insufficient documentation

## 2019-02-10 DIAGNOSIS — E039 Hypothyroidism, unspecified: Secondary | ICD-10-CM | POA: Diagnosis not present

## 2019-02-10 DIAGNOSIS — Z881 Allergy status to other antibiotic agents status: Secondary | ICD-10-CM | POA: Insufficient documentation

## 2019-02-10 DIAGNOSIS — Z79899 Other long term (current) drug therapy: Secondary | ICD-10-CM | POA: Diagnosis not present

## 2019-02-10 DIAGNOSIS — Z833 Family history of diabetes mellitus: Secondary | ICD-10-CM | POA: Diagnosis not present

## 2019-02-10 DIAGNOSIS — F419 Anxiety disorder, unspecified: Secondary | ICD-10-CM | POA: Diagnosis not present

## 2019-02-10 DIAGNOSIS — Z79890 Hormone replacement therapy (postmenopausal): Secondary | ICD-10-CM | POA: Insufficient documentation

## 2019-02-10 DIAGNOSIS — Z923 Personal history of irradiation: Secondary | ICD-10-CM | POA: Diagnosis not present

## 2019-02-10 DIAGNOSIS — D510 Vitamin B12 deficiency anemia due to intrinsic factor deficiency: Secondary | ICD-10-CM | POA: Diagnosis not present

## 2019-02-10 DIAGNOSIS — Z9221 Personal history of antineoplastic chemotherapy: Secondary | ICD-10-CM | POA: Insufficient documentation

## 2019-02-10 DIAGNOSIS — M899 Disorder of bone, unspecified: Secondary | ICD-10-CM | POA: Diagnosis present

## 2019-02-10 DIAGNOSIS — Z7982 Long term (current) use of aspirin: Secondary | ICD-10-CM | POA: Insufficient documentation

## 2019-02-10 DIAGNOSIS — Z825 Family history of asthma and other chronic lower respiratory diseases: Secondary | ICD-10-CM | POA: Diagnosis not present

## 2019-02-10 DIAGNOSIS — C7951 Secondary malignant neoplasm of bone: Secondary | ICD-10-CM | POA: Diagnosis not present

## 2019-02-10 DIAGNOSIS — Z888 Allergy status to other drugs, medicaments and biological substances status: Secondary | ICD-10-CM | POA: Insufficient documentation

## 2019-02-10 LAB — APTT: aPTT: 40 seconds — ABNORMAL HIGH (ref 24–36)

## 2019-02-10 LAB — CBC
HCT: 34 % — ABNORMAL LOW (ref 36.0–46.0)
Hemoglobin: 10.4 g/dL — ABNORMAL LOW (ref 12.0–15.0)
MCH: 27.7 pg (ref 26.0–34.0)
MCHC: 30.6 g/dL (ref 30.0–36.0)
MCV: 90.7 fL (ref 80.0–100.0)
Platelets: 303 10*3/uL (ref 150–400)
RBC: 3.75 MIL/uL — ABNORMAL LOW (ref 3.87–5.11)
RDW: 15.1 % (ref 11.5–15.5)
WBC: 9.7 10*3/uL (ref 4.0–10.5)
nRBC: 0 % (ref 0.0–0.2)

## 2019-02-10 LAB — PROTIME-INR
INR: 1.1 (ref 0.8–1.2)
Prothrombin Time: 14.1 seconds (ref 11.4–15.2)

## 2019-02-10 MED ORDER — SODIUM CHLORIDE 0.9 % IV SOLN: INTRAVENOUS | Status: DC

## 2019-02-10 MED ORDER — MIDAZOLAM HCL 5 MG/5ML IJ SOLN
INTRAMUSCULAR | Status: AC
  Filled 2019-02-10: qty 5

## 2019-02-10 MED ORDER — FENTANYL CITRATE (PF) 100 MCG/2ML IJ SOLN
INTRAMUSCULAR | Status: AC
  Filled 2019-02-10: qty 4

## 2019-02-10 MED ORDER — MIDAZOLAM HCL 5 MG/5ML IJ SOLN
INTRAMUSCULAR | Status: AC | PRN
  Administered 2019-02-10 (×2): 1 mg via INTRAVENOUS
  Administered 2019-02-10: 0.5 mg via INTRAVENOUS

## 2019-02-10 MED ORDER — FENTANYL CITRATE (PF) 100 MCG/2ML IJ SOLN
INTRAMUSCULAR | Status: AC | PRN
  Administered 2019-02-10: 50 ug via INTRAVENOUS
  Administered 2019-02-10: 25 ug via INTRAVENOUS

## 2019-02-10 NOTE — Consult Note (Signed)
Chief Complaint: Indeterminate hypermetabolic lesion involving the anterior aspect the right ilium   Referring Physician(s): Finnegan,Timothy J  Patient Status: [facility] - Out-pt  History of Present Illness: Jill Gross is a 63 y.o. female with past medical history significant for left-sided lung cancer, smoking, COPD, pernicious anemia, hyperlipidemia and hypothyroidism who presents today for CT-guided biopsy of indeterminate hypermetabolic lesion involving the anterior aspect of the right ilium.  The patient is accompanied by her husband and serves as her own historian.  Patient has at her baseline state of health and is currently without complaint.  Specifically, no worsening shortness of breath.  No chest pain.  No fever or chills.    Past Medical History:  Diagnosis Date  . Allergy   . Anemia    pernicious  . Anxiety   . Arthritis   . Atrophic vaginitis   . Back pain   . COPD (chronic obstructive pulmonary disease) ([place])   . Depression   . Diverticulosis   . Dyspnea   . Family history of adverse reaction to anesthesia    MOM-BUT UNSURE WHAT REACTION WAS  . Fatigue   . GERD (gastroesophageal reflux disease)   . H/O degenerative disc disease   . Hashimoto's disease   . History of chemotherapy 04/2016  . Hyperlipidemia   . Hypothyroid   . Insomnia   . Low back pain   . Lumbago   . Neuropathy due to chemotherapeutic drug ([place])   . Osteopenia   . Pneumonia 11/2016  . 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 Tobacco use   . Vitamin B12 deficiency     Past Surgical History:  Procedure Laterality Date  . ABDOMINAL HYSTERECTOMY    . ANKLE SURGERY Left    x4  . CARPAL TUNNEL RELEASE Right 03/2014  . CHOLECYSTECTOMY    . ENDOBRONCHIAL ULTRASOUND N/A 01/07/2016   Procedure: ENDOBRONCHIAL ULTRASOUND;  Surgeon: Vilinda Boehringer, MD;  Location: [facility] ORS;  Service: Cardiopulmonary;  Laterality: N/A;  . ENDOBRONCHIAL ULTRASOUND N/A  09/16/2016   Procedure: ENDOBRONCHIAL ULTRASOUND;  Surgeon: Vilinda Boehringer, MD;  Location: [facility] ORS;  Service: Cardiopulmonary;  Laterality: N/A;  . ENDOBRONCHIAL ULTRASOUND N/A 07/02/2017   Procedure: ENDOBRONCHIAL ULTRASOUND;  Surgeon: Laverle Hobby, MD;  Location: [facility] ORS;  Service: Pulmonary;  Laterality: N/A;  . HERNIA REPAIR    . PORT-A-CATH REMOVAL N/A 09/08/2017   Procedure: REMOVAL PORT-A-CATH;  Surgeon: [name], MD;  Location: [facility] ORS;  Service: General;  Laterality: N/A;  . PORTACATH PLACEMENT Right 01/23/2016   Procedure: INSERTION PORT-A-CATH;  Surgeon: [name], MD;  Location: [facility] ORS;  Service: General;  Laterality: Right;    Allergies: Macrobid [nitrofurantoin]; Effexor [venlafaxine]; Hctz [hydrochlorothiazide]; Neurontin [gabapentin]; Paxil [paroxetine hcl]; Pravachol [pravastatin sodium]; Prozac [fluoxetine hcl]; Sulfur; Wellbutrin [bupropion]; and Zoloft [sertraline hcl]  Medications: Prior to Admission medications   Medication Sig Start Date End Date Taking? Authorizing Provider  acetaminophen (TYLENOL) 500 MG tablet Take 1,000 mg by mouth daily as needed for mild pain.   Yes [provider]  albuterol (PROVENTIL HFA;VENTOLIN HFA) 108 (90 Base) MCG/ACT inhaler Inhale 2 puffs into the lungs every 6 (six) hours as needed for wheezing or shortness of breath. 04/13/18  Yes [name], NP  ALPRAZolam Duanne Moron) 0.25 MG tablet Take 1 tablet (0.25 mg total) by mouth at bedtime as needed for anxiety. 01/14/19  Yes Lloyd Huger, MD  amitriptyline (ELAVIL) 10 MG tablet TAKE 2 TABLETS BY  MOUTH AT BEDTIME 12/14/18  Yes Volney American, PA-C  amitriptyline (ELAVIL) 25 MG tablet Take 1-2 tablets at bedtime daily 10/15/18  Yes Volney American, PA-C  aspirin EC 81 MG tablet Take 81 mg by mouth daily.   Yes [provider]  cyanocobalamin (,VITAMIN B-12,) 1000 MCG/ML injection INJECT 1 MILLILITER INTO THE MUSCLE ONCE 12/14/18  Yes Volney American, PA-C  furosemide (LASIX) 20 MG tablet Take 1 tablet (20 mg total) by mouth every other day. 12/14/18 03/14/19 Yes Laverle Hobby, MD  ipratropium-albuterol (DUONEB) 0.5-2.5 (3) MG/3ML SOLN Take 3 mLs by nebulization every 6 (six) hours as needed. 11/28/16  Yes Sainani, Belia Heman, MD  levothyroxine (SYNTHROID, LEVOTHROID) 112 MCG tablet TAKE 1 TABLET ON AN EMPTY STOMACH WITH A GLASS OF WATER AT LEAST 30 TO 60 MINUTES BEFORE BREAKFAST. 11/16/18  Yes Volney American, PA-C  potassium chloride (K-DUR) 10 MEQ tablet Take 1 tablet (10 mEq total) by mouth every other day. 12/14/18  Yes Laverle Hobby, MD  promethazine (PHENERGAN) 25 MG tablet Take 1 tablet (25 mg total) by mouth every 6 (six) hours as needed for nausea or vomiting. 02/22/18  Yes Lloyd Huger, MD  ranitidine (ZANTAC) 150 MG tablet Take 150 mg by mouth 2 (two) times daily as needed for heartburn.   Yes [provider]  simvastatin (ZOCOR) 10 MG tablet TAKE (1) TABLET BY MOUTH EVERY DAY AT 6:00 IN THE EVENING 04/26/18  Yes [name], NP  zolpidem (AMBIEN) 10 MG tablet TAKE ONE TABLET BY MOUTH DAILY AT BEDTIME AS NEEDED FOR SLEEP. 11/16/18  Yes Volney American, PA-C  levothyroxine (SYNTHROID, LEVOTHROID) 125 MCG tablet TAKE 1 TABLET ON AN EMPTY STOMACH WITH A GLASS OF WATER AT LEAST 30 TO 44 MINUTES BEFORE BREAKFAST. 12/14/18   Volney American, PA-C     Family History  Problem Relation Age of Onset  . Emphysema Mother   . Diabetes Father     Social History   Socioeconomic History  . Marital status: Married    Spouse name: Shanon Brow  . Number of children: Not on file  . Years of education: Not on file  . Highest education level: Not on file  Occupational History  . Not on file  Social Needs  . Financial resource strain: Not on file  . Food insecurity:    Worry: Not on file    Inability: Not on file  . Transportation needs:    Medical: Not on file    Non-medical: Not on file    Tobacco Use  . Smoking status: Former Smoker    Packs/day: 1.00    Years: 30.00    Pack years: 30.00    Types: Cigarettes    Last attempt to quit: 01/11/2016    Years since quitting: 3.0  . Smokeless tobacco: Never Used  Substance and Sexual Activity  . Alcohol use: No    Alcohol/week: 0.0 standard drinks  . Drug use: No  . Sexual activity: Not Currently  Lifestyle  . Physical activity:    Days per week: Not on file    Minutes per session: Not on file  . Stress: Not on file  Relationships  . Social connections:    Talks on phone: Not on file    Gets together: Not on file    Attends religious service: Not on file    Active member of club or organization: Not on file    Attends meetings of clubs or organizations: Not  on file    Relationship status: Not on file  Other Topics Concern  . Not on file  Social History Narrative  . Not on file    ECOG Status: 0 - Asymptomatic  Review of Systems: A 12 point ROS discussed and pertinent positives are indicated in the HPI above.  All other systems are negative.  Review of Systems  Constitutional: Negative for activity change, appetite change, chills, fatigue and fever.  Respiratory: Negative.   Cardiovascular: Negative.   All other systems reviewed and are negative.   Vital Signs: BP 121/69   Pulse 96   Temp 98.4 F (36.9 C) (Oral)   Resp (!) 21   Ht 5\' 3"  (1.6 m)   Wt 68 kg   LMP  (LMP Unknown)   BMI 26.57 kg/m   Physical Exam Vitals signs and nursing note reviewed. Exam conducted with a chaperone present.  Constitutional:      Appearance: Normal appearance.  Cardiovascular:     Rate and Rhythm: Normal rate and regular rhythm.  Pulmonary:     Effort: Pulmonary effort is normal.     Comments: Decreased left-sided breath sounds Neurological:     Mental Status: She is alert.  Psychiatric:        Mood and Affect: Mood normal.        Behavior: Behavior normal.     Imaging: Ct Chest W Contrast  Result Date:  01/13/2019 CLINICAL DATA:  Small cell lung cancer. EXAM: CT CHEST WITH CONTRAST TECHNIQUE: Multidetector CT imaging of the chest was performed during intravenous contrast administration. CONTRAST:  6mL OMNIPAQUE IOHEXOL 300 MG/ML  SOLN COMPARISON:  08/25/2018 FINDINGS: Cardiovascular: The heart size appears normal. There is a moderate pericardial effusion which appears similar in volume to the previous exam. The effusion appears to communicate with left lung mass, image 58/2 and image 56/5. Aortic atherosclerosis. Calcifications within the LAD and RCA coronary artery noted. Mediastinum/Nodes: No axillary or supraclavicular adenopathy. No mediastinal adenopathy identified. The trachea appears patent and is midline. Normal appearance of the esophagus. Lungs/Pleura: There is diffuse volume loss within the left hemithorax. Previously noted large left pleural effusion now appears loculated and is slightly decreased in volume from previous exam. There is new diffuse pleural enhancement and thickening. Subtle pleural nodularity is identified. For example, image 100/2. Left masslike area of architectural distortion and volume loss appears increased in size from previous exam. On today's study this measures 4.0 by 4.7 by 6.5 cm. There is near complete occlusion of the left upper lobe and lingular branches of the pulmonary artery. The lingular and left lower lobe bronchi appear occluded. The right lung appears well aerated. A few small nodules are identified within the right upper lobe which are nonspecific. This includes a lateral right upper lobe nodule measuring 3 mm, image 40/3. Upper Abdomen: No acute abnormality. Musculoskeletal: No aggressive lytic or sclerotic bone lesions. IMPRESSION: 1. Large area of masslike architectural distortion within the left upper lobe and left perihilar region is identified and is increased in size from previous exam. There is obstruction of the lingular and right lower lobe bronchus and  marked narrowing of the left upper lobe pulmonary artery. Imaging findings are concerning for progress in of disease. Consider further evaluation with PET-CT. 2. Interval loculation of previous left pleural effusion. There is new mild diffuse left lung pleural enhancement and thickening with subtle nodularity concerning for malignant pleural effusion. This could be better assessed with diagnostic thoracentesis. 3. Aortic atherosclerosis with coronary artery  atherosclerotic calcifications. Aortic Atherosclerosis (ICD10-I70.0). 4. Pericardial effusion. Can not rule out malignant pericardial effusion. Electronically Signed   By: Kerby Moors M.D.   On: 01/13/2019 16:25   Nm Pet Image Restag (ps) Skull Base To Thigh  Result Date: 01/26/2019 CLINICAL DATA:  Subsequent treatment strategy for lung carcinoma. EXAM: NUCLEAR MEDICINE PET SKULL BASE TO THIGH TECHNIQUE: 8.0 mCi F-18 FDG was injected intravenously. Full-ring PET imaging was performed from the skull base to thigh after the radiotracer. CT data was obtained and used for attenuation correction and anatomic localization. Fasting blood glucose: 74 mg/dl COMPARISON:  CT 01/13/2019, PET-CT 05/26/2016, 01/22/2016 FINDINGS: Mediastinal blood pool activity: SUV max 2.54 NECK: No hypermetabolic lymph nodes in the neck. Incidental CT findings: none CHEST: There is occlusion of the LEFT mainstem bronchus. The LEFT lung is completely collapsed. There is intense metabolic activity associated the lingual lobe of the lung as well as centrally in the LEFT lower lobe. The left upper lobe lung is not hypermetabolic. The hypermetabolic region of the lingula measures approximately 6.6 x 4.0 cm. Activity is intense with SUV max equal 19.4. No hypermetabolic mediastinal lymph nodes. No hypermetabolic nodules in the RIGHT lung. No hypermetabolic supraclavicular nodes. Hypermetabolic lingular lobe does not extend beyond the chest wall. Incidental CT findings: A small pericardial  effusion. ABDOMEN/PELVIS: No abnormal hypermetabolic activity within the liver, pancreas, adrenal glands, or spleen. No hypermetabolic lymph nodes in the abdomen or pelvis. Incidental CT findings: none SKELETON: Focus intense metabolic activity localizing to the RIGHT iliac wing with SUV max equal 12.8. There is a small lytic lesion accompanying this metabolic activity measuring approximately 10 mm (image 203/3). Incidental CT findings: none IMPRESSION: 1. Complete collapse of the LEFT lung secondary to obstructing lesion in the LEFT mainstem bronchus. 2. Hypermetabolic mass in the lingula consistent with tumor recurrence. 3. No evidence of metastatic mediastinal nodal metastasis. 4. 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 5. Small to moderate pericardial effusion again noted. Electronically Signed   By: Suzy Bouchard M.D.   On: 01/26/2019 16:35    Labs:  CBC: Recent Labs    02/10/19 0925  WBC 9.7  HGB 10.4*  HCT 34.0*  PLT 303    COAGS: No results for input(s): INR, APTT in the last 8760 hours.  BMP: Recent Labs    04/13/18 1644 08/25/18 1003 10/15/18 1505 01/13/19 1111  NA 137  --  135  --   K 4.2  --  4.4  --   CL 95*  --  96  --   CO2 24  --  25  --   GLUCOSE 89  --  92  --   BUN 6*  --  11  --   CALCIUM 9.5  --  9.1  --   CREATININE 0.75 0.80 0.71 0.84  GFRNONAA 86  --  92 >60  GFRAA 99  --  106 >60    LIVER FUNCTION TESTS: Recent Labs    04/13/18 1644 10/15/18 1505  BILITOT 0.2 0.2  AST 22 21  ALT 18 12  ALKPHOS 123* 109  PROT 7.4 7.1  ALBUMIN 4.2 3.9    TUMOR MARKERS: No results for input(s): AFPTM, CEA, CA199, CHROMGRNA in the last 8760 hours.  Assessment and Plan:  NICOLA HEINEMANN is a 63 y.o. female with past medical history significant for left-sided lung cancer, smoking, COPD, pernicious anemia, hyperlipidemia and hypothyroidism who presents today for CT-guided biopsy of indeterminate hypermetabolic lesion involving the anterior  aspect of the right ilium.    Patient has at her baseline state of health and is currently without complaint.   Risks and benefits of CT-guided biopsy of hypermetabolic lesion involving the anterior aspect of the right ilium was discussed with the patient and/or patient's family including, but not limited to bleeding, infection, damage to adjacent structures or low yield requiring additional tests.  All of the questions were answered and there is agreement to proceed.  Consent signed and in chart.     Thank you for this interesting consult.  I greatly enjoyed meeting CHAISE PASSARELLA and look forward to participating in their care.  A copy of this report was sent to the requesting provider on this date.  Electronically Signed: Sandi Mariscal, MD 02/10/2019, 10:12 AM   I spent a total of 15 Minutes in face to face in clinical consultation, greater than 50% of which was counseling/coordinating care for CT-guided biopsy of hypermetabolic lesion involving the anterior aspect of the right ilium.

## 2019-02-10 NOTE — Procedures (Signed)
Pre-procedure Diagnosis: Hypermetabolic lesion involving the anterior aspect of the right ilium. Post-procedure Diagnosis: Same  Technically successful CT guided biopsy of indeterminate hypermetabolic lesion involving the anterior aspect of the right ilium.   Complications: None Immediate  EBL: None  Signed: Sandi Mariscal Pager: [phone] 02/10/2019, 11:12 AM

## 2019-02-14 ENCOUNTER — Other Ambulatory Visit: Payer: Self-pay | Admitting: Anatomic Pathology & Clinical Pathology

## 2019-02-14 LAB — SURGICAL PATHOLOGY

## 2019-02-17 ENCOUNTER — Other Ambulatory Visit: Payer: Self-pay | Admitting: *Deleted

## 2019-02-17 ENCOUNTER — Other Ambulatory Visit: Payer: Managed Care, Other (non HMO)

## 2019-02-17 DIAGNOSIS — C3490 Malignant neoplasm of unspecified part of unspecified bronchus or lung: Secondary | ICD-10-CM

## 2019-02-17 NOTE — Progress Notes (Signed)
Tumor Board Documentation  Jill Gross was presented by Dr [name] at our Tumor Board on 02/17/2019, which included representatives from medical oncology, radiation oncology, surgical oncology, surgical, radiology, pathology, navigation, nutrition, research.  Jill Gross currently presents as a current patient, for discussion, for [place] with history of the following treatments: active survellience, adjuvant chemotherapy, surgical intervention(s).  Additionally, we reviewed previous medical and familial history, history of present illness, and recent lab results along with all available histopathologic and imaging studies. The tumor board considered available treatment options and made the following recommendations: Additional screening Possible Chemotherapy Brain imaging, Follow up with Dr [name]  The following procedures/referrals were also placed: No orders of the defined types were placed in this encounter.   Clinical Trial Status: not discussed   Staging used: AJCC Stage Group  AJCC Staging:       Group: Stage 4  National site-specific guidelines NCCN were discussed with respect to the case.  Tumor board is a meeting of clinicians from various specialty areas who evaluate and discuss patients for whom a multidisciplinary approach is being considered. Final determinations in the plan of care are those of the provider(s). The responsibility for follow up of recommendations given during tumor board is that of the provider.   Today's extended care, comprehensive team conference, Jill Gross was not present for the discussion and was not examined.   Multidisciplinary Tumor Board is a multidisciplinary case peer review process.  Decisions discussed in the Multidisciplinary Tumor Board reflect the opinions of the specialists present at the conference without having examined the patient.  Ultimately, treatment and diagnostic decisions rest with the primary provider(s) and the patient.

## 2019-02-21 DIAGNOSIS — C7951 Secondary malignant neoplasm of bone: Secondary | ICD-10-CM | POA: Insufficient documentation

## 2019-02-21 NOTE — Progress Notes (Signed)
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  INTERVAL HISTORY: Patient returns to clinic today for further evaluation, discussion of her biopsy results, and treatment planning.  She is anxious, but otherwise feels well.  She continues to have chronic shortness of breath and requires oxygen 24 hours/day. She does not complain of increased cough, hemoptysis, or chest pain.  She has no neurologic complaints.  She denies any recent fevers or illnesses.  She has a fair appetite and denies weight loss.  She denies any nausea, vomiting, constipation, or diarrhea. She has no urinary complaints.  Patient feels at her baseline offers no further specific complaints today.  REVIEW OF SYSTEMS:   Review of Systems  Constitutional: Positive for malaise/fatigue. Negative for fever and weight loss.  HENT: Negative for sore throat.   Eyes: Negative.   Respiratory: Positive for shortness of breath. Negative for cough and hemoptysis.   Cardiovascular: Negative.  Negative for chest pain and leg swelling.  Gastrointestinal: Negative for nausea and vomiting.  Genitourinary: Negative.  Negative for dysuria.  Musculoskeletal: Negative.  Negative for back pain.  Skin: Negative.   Neurological: Positive for weakness. Negative for dizziness, tingling, sensory change and focal weakness.  Psychiatric/Behavioral: Negative.  Negative for depression. The patient is not nervous/anxious.     As per HPI. Otherwise, a complete review of systems is negative.  PAST  MEDICAL HISTORY: Past Medical History:  Diagnosis Date   Allergy    Anemia    pernicious   Anxiety    Arthritis    Atrophic vaginitis    Back pain    COPD (chronic obstructive pulmonary disease) (HCC)    Depression    Diverticulosis    Dyspnea    Family history of adverse reaction to anesthesia    MOM-BUT UNSURE WHAT REACTION WAS   Fatigue    GERD (gastroesophageal reflux disease)    H/O degenerative disc disease    Hashimoto's disease    History of chemotherapy 04/2016   Hyperlipidemia    Hypothyroid    Insomnia    Low back pain    Lumbago    Neuropathy due to chemotherapeutic drug ([place])    Osteopenia    Pneumonia 11/2016   Small cell carcinoma of left lung ([place]) 01/2016   rad + chemo tx's   Tobacco use    Vitamin B12 deficiency     PAST SURGICAL HISTORY: Past Surgical History:  Procedure Laterality Date   ABDOMINAL HYSTERECTOMY     ANKLE SURGERY Left    x4   CARPAL TUNNEL RELEASE Right 03/2014   CHOLECYSTECTOMY     ENDOBRONCHIAL ULTRASOUND N/A 01/07/2016   Procedure: ENDOBRONCHIAL ULTRASOUND;  Surgeon: Vilinda Boehringer, MD;  Location: [facility] OR[facility];  Service: Cardiopulmonary;  Laterality: N/A;   ENDOBRONCHIAL ULTRASOUND N/A 09/16/2016   Procedure: ENDOBRONCHIAL ULTRASOUND;  Surgeon: Vilinda Boehringer, MD;  Location: [facility];  Service: Cardiopulmonary;  Laterality: N/A;   ENDOBRONCHIAL ULTRASOUND N/A 07/02/2017   Procedure: ENDOBRONCHIAL ULTRASOUND;  Surgeon: Laverle Hobby, MD;  Location: [facility];  Service: Pulmonary;  Laterality: N/A;   HERNIA REPAIR     PORT-A-CATH REMOVAL N/A 09/08/2017  Procedure: REMOVAL PORT-A-CATH;  Surgeon: [name], MD;  Location: [facility];  Service: General;  Laterality: N/A;   PORTACATH PLACEMENT Right 01/23/2016   Procedure: INSERTION PORT-A-CATH;  Surgeon: [name], MD;  Location: [facility];  Service: General;  Laterality: Right;    FAMILY HISTORY Family History  Problem Relation Age of  Onset   Emphysema Mother    Diabetes Father        ADVANCED DIRECTIVES:    HEALTH MAINTENANCE: Social History   Tobacco Use   Smoking status: Former Smoker    Packs/day: 1.00    Years: 30.00    Pack years: 30.00    Types: Cigarettes    Last attempt to quit: 01/11/2016    Years since quitting: 3.1   Smokeless tobacco: Never Used  Substance Use Topics   Alcohol use: No    Alcohol/week: 0.0 standard drinks   Drug use: No     Allergies  Allergen Reactions   Macrobid [Nitrofurantoin] Anaphylaxis   Effexor [Venlafaxine] Other (See Comments)    UNKNOWN   Hctz [Hydrochlorothiazide] Other (See Comments)    cramps   Neurontin [Gabapentin] Other (See Comments)    UNKNOWN    Paxil [Paroxetine Hcl] Other (See Comments)    UNKNOWN    Pravachol [Pravastatin Sodium] Other (See Comments)    aching   Prozac [Fluoxetine Hcl] Other (See Comments)    UNKNOWN    Sulfur Itching and Other (See Comments)    "blisters in mouth"   Wellbutrin [Bupropion] Itching   Zoloft [Sertraline Hcl] Other (See Comments)    UNKNOWN     Current Outpatient Medications  Medication Sig Dispense Refill   acetaminophen (TYLENOL) 500 MG tablet Take 1,000 mg by mouth daily as needed for mild pain.     albuterol (PROVENTIL HFA;VENTOLIN HFA) 108 (90 Base) MCG/ACT inhaler Inhale 2 puffs into the lungs every 6 (six) hours as needed for wheezing or shortness of breath. 1 Inhaler 2   ALPRAZolam (XANAX) 0.25 MG tablet Take 1 tablet (0.25 mg total) by mouth at bedtime as needed for anxiety. 30 tablet 0   amitriptyline (ELAVIL) 10 MG tablet TAKE 2 TABLETS BY MOUTH AT BEDTIME 60 tablet 3   amitriptyline (ELAVIL) 25 MG tablet Take 1-2 tablets at bedtime daily 60 tablet 5   aspirin EC 81 MG tablet Take 81 mg by mouth daily.     cyanocobalamin (,VITAMIN B-12,) 1000 MCG/ML injection INJECT 1 MILLILITER INTO THE MUSCLE ONCE 10 mL 1   furosemide (LASIX) 20 MG tablet Take 1 tablet (20 mg total) by  mouth every other day. 30 tablet 11   ipratropium-albuterol (DUONEB) 0.5-2.5 (3) MG/3ML SOLN Take 3 mLs by nebulization every 6 (six) hours as needed. 360 mL 0   levothyroxine (SYNTHROID, LEVOTHROID) 112 MCG tablet TAKE 1 TABLET ON AN EMPTY STOMACH WITH A GLASS OF WATER AT LEAST 30 TO 60 MINUTES BEFORE BREAKFAST. 30 tablet 5   levothyroxine (SYNTHROID, LEVOTHROID) 125 MCG tablet TAKE 1 TABLET ON AN EMPTY STOMACH WITH A GLASS OF WATER AT LEAST 30 TO 60 MINUTES BEFORE BREAKFAST. 90 tablet 0   potassium chloride (K-DUR) 10 MEQ tablet Take 1 tablet (10 mEq total) by mouth every other day. 30 tablet 11   promethazine (PHENERGAN) 25 MG tablet Take 1 tablet (25 mg total) by mouth every 6 (six) hours as needed for nausea or vomiting. 30 tablet 2   ranitidine (ZANTAC) 150 MG tablet Take 150 mg by mouth 2 (two) times daily as needed  for heartburn.     simvastatin (ZOCOR) 10 MG tablet TAKE (1) TABLET BY MOUTH EVERY DAY AT 6:00 IN THE EVENING 30 tablet 12   zolpidem (AMBIEN) 10 MG tablet TAKE ONE TABLET BY MOUTH DAILY AT BEDTIME AS NEEDED FOR SLEEP. 30 tablet 5   lidocaine-prilocaine (EMLA) cream Apply to affected area once 30 g 3   ondansetron (ZOFRAN) 8 MG tablet Take 1 tablet (8 mg total) by mouth 2 (two) times daily as needed for refractory nausea / vomiting. 30 tablet 2   prochlorperazine (COMPAZINE) 10 MG tablet Take 1 tablet (10 mg total) by mouth every 6 (six) hours as needed (Nausea or vomiting). 60 tablet 2   No current facility-administered medications for this visit.     OBJECTIVE: Vitals:   02/22/19 1419  BP: 116/84  Pulse: 100  Resp: 20  Temp: (!) 95.2 F (35.1 C)     Body mass index is 26.45 kg/m.    ECOG FS:1 - Symptomatic but completely ambulatory  General: Well-developed, well-nourished, no acute distress.  Sitting in a wheelchair. Eyes: Pink conjunctiva, anicteric sclera. HEENT: Normocephalic, moist mucous membranes, clear oropharnyx. Lungs: Decreased breath sounds on  left. Heart: Regular rate and rhythm. No rubs, murmurs, or gallops. Abdomen: Soft, nontender, nondistended. No organomegaly noted, normoactive bowel sounds. Musculoskeletal: No edema, cyanosis, or clubbing. Neuro: Alert, answering all questions appropriately. Cranial nerves grossly intact. Skin: No rashes or petechiae noted. Psych: Normal affect.  LAB RESULTS:  Lab Results  Component Value Date   NA 135 10/15/2018   K 4.4 10/15/2018   CL 96 10/15/2018   CO2 25 10/15/2018   GLUCOSE 92 10/15/2018   BUN 11 10/15/2018   CREATININE 0.84 01/13/2019   CALCIUM 9.1 10/15/2018   PROT 7.1 10/15/2018   ALBUMIN 3.9 10/15/2018   AST 21 10/15/2018   ALT 12 10/15/2018   ALKPHOS 109 10/15/2018   BILITOT 0.2 10/15/2018   GFRNONAA >60 01/13/2019   GFRAA >60 01/13/2019    Lab Results  Component Value Date   WBC 9.7 02/10/2019   NEUTROABS 5.9 09/04/2017   HGB 10.4 (L) 02/10/2019   HCT 34.0 (L) 02/10/2019   MCV 90.7 02/10/2019   PLT 303 02/10/2019     STUDIES: Nm Pet Image Restag (ps) Skull Base To Thigh  Result Date: 01/26/2019 CLINICAL DATA:  Subsequent treatment strategy for lung carcinoma. EXAM: NUCLEAR MEDICINE PET SKULL BASE TO THIGH TECHNIQUE: 8.0 mCi F-18 FDG was injected intravenously. Full-ring PET imaging was performed from the skull base to thigh after the radiotracer. CT data was obtained and used for attenuation correction and anatomic localization. Fasting blood glucose: 74 mg/dl COMPARISON:  CT 01/13/2019, PET-CT 05/26/2016, 01/22/2016 FINDINGS: Mediastinal blood pool activity: SUV max 2.54 NECK: No hypermetabolic lymph nodes in the neck. Incidental CT findings: none CHEST: There is occlusion of the LEFT mainstem bronchus. The LEFT lung is completely collapsed. There is intense metabolic activity associated the lingual lobe of the lung as well as centrally in the LEFT lower lobe. The left upper lobe lung is not hypermetabolic. The hypermetabolic region of the lingula measures  approximately 6.6 x 4.0 cm. Activity is intense with SUV max equal 19.4. No hypermetabolic mediastinal lymph nodes. No hypermetabolic nodules in the RIGHT lung. No hypermetabolic supraclavicular nodes. Hypermetabolic lingular lobe does not extend beyond the chest wall. Incidental CT findings: A small pericardial effusion. ABDOMEN/PELVIS: No abnormal hypermetabolic activity within the liver, pancreas, adrenal glands, or spleen. No hypermetabolic lymph nodes in the abdomen or pelvis.  Incidental CT findings: none SKELETON: Focus intense metabolic activity localizing to the RIGHT iliac wing with SUV max equal 12.8. There is a small lytic lesion accompanying this metabolic activity measuring approximately 10 mm (image 203/3). Incidental CT findings: none IMPRESSION: 1. Complete collapse of the LEFT lung secondary to obstructing lesion in the LEFT mainstem bronchus. 2. Hypermetabolic mass in the lingula consistent with tumor recurrence. 3. No evidence of metastatic mediastinal nodal metastasis. 4. 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 5. Small to moderate pericardial effusion again noted. Electronically Signed   By: Suzy Bouchard M.D.   On: 01/26/2019 16:35   Ct Biopsy  Result Date: 02/10/2019 INDICATION: History of lung cancer, now with indeterminate hypermetabolic lesion involving the anterior aspect the right ilium. Please perform CT-guided biopsy for tissue diagnostic purposes. EXAM: CT-GUIDED BONE LESION BIOPSY MEDICATIONS: None ANESTHESIA/SEDATION: Fentanyl 75 mcg IV; Versed 2.5 mg IV Sedation Time: 19 Minutes; The patient was continuously monitored during the procedure by the interventional radiology nurse under my direct supervision. COMPLICATIONS: None immediate. PROCEDURE: Informed consent was obtained from the patient following an explanation of the procedure, risks, benefits and alternatives. The patient understands, agrees and consents for the procedure. All questions were addressed. A  time out was performed prior to the initiation of the procedure. The patient was positioned supine and non-contrast localization CT was performed of the pelvis demonstrating unchanged size and appearance the approximately 1.7 x 1.1 cm lytic lesion involving the anterior aspect of the right ilium (image 27, series 2). The operative site was prepped and draped in the usual sterile fashion. Under sterile conditions and local anesthesia, a 22 gauge spinal needle was utilized for procedural planning. Next, an 11 gauge coaxial bone biopsy needle was advanced into the peripheral aspect of the ill-defined lytic lesion involving the anterior aspect the right ilium. Appropriate position was confirmed and 3 biopsies were obtained with the inner 13 gauge device of the bone biopsy set (representative image 37, series 3). Appropriate positioning was confirmed and a final core needle biopsy was obtained with the outer 11 gauge coaxial bone biopsy device (representative image 46, series 3). The needle was removed intact. Superficial hemostasis was obtained with compression and a dressing was placed. The patient tolerated the procedure well without immediate post procedural complication. IMPRESSION: Successful CT guided biopsy of lytic lesion involving the anterior aspect the right ilium. Electronically Signed   By: Sandi Mariscal M.D.   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  PLAN:    1. 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  PET scan results from January 26, 2019 reviewed independently and report as above with metastatic lesion in the right ilium.  CT-guided biopsy completed on February 10, 2019 confirmed metastatic disease.  After lengthy discussion with the patient, she wishes to pursue aggressive chemotherapy  and will receive carboplatinum and etoposide on day 1 with etoposide on days 2 and 3.  She may require Neulasta in the future if necessary.  Prior to initiating treatment, patient will require port placement.  She will also have an MRI of her brain to complete the restaging work-up.  Return to clinic on March 02, 2019 for further evaluation and consideration of cycle 1, day 1.   2.  History of pulmonary embolism: Likely secondary to increased sedentary lifestyle.  Patient completed 6 months of Eliquis.   3. Peripheral neuropathy: Patient does not complain of this today.  Patient is no longer taking gabapentin. 4. Anemia: Patient's hemoglobin has trended down to 10.4.  Monitor.   5. Thyroid: Continue current dose of 125 g Synthroid. Will defer to primary care for adjustment of medications.  6. Pain: Patient does not complain of pain today.  Continue Percocet as needed. 7. Anxiety: Chronic and unchanged.  Continue Xanax as needed. 8.  Nausea: Patient does not complain of this today.  Continue Phenergan as needed. 9.  Shortness of breath: Chronic and unchanged.  Continue oxygen as prescribed.  Patient expressed understanding and was in agreement with this plan. She also understands that She can call clinic at any time with any questions, concerns, or complaints.    Lloyd Huger, MD   02/23/2019 2:10 PM

## 2019-02-22 ENCOUNTER — Other Ambulatory Visit: Payer: Self-pay

## 2019-02-22 ENCOUNTER — Inpatient Hospital Stay: Payer: Managed Care, Other (non HMO) | Attending: Oncology | Admitting: Oncology

## 2019-02-22 VITALS — BP 116/84 | HR 100 | Temp 95.2°F | Resp 20 | Wt 149.3 lb

## 2019-02-22 DIAGNOSIS — C7951 Secondary malignant neoplasm of bone: Secondary | ICD-10-CM | POA: Diagnosis not present

## 2019-02-22 DIAGNOSIS — G629 Polyneuropathy, unspecified: Secondary | ICD-10-CM | POA: Insufficient documentation

## 2019-02-22 DIAGNOSIS — Z9221 Personal history of antineoplastic chemotherapy: Secondary | ICD-10-CM | POA: Insufficient documentation

## 2019-02-22 DIAGNOSIS — E063 Autoimmune thyroiditis: Secondary | ICD-10-CM | POA: Diagnosis not present

## 2019-02-22 DIAGNOSIS — D649 Anemia, unspecified: Secondary | ICD-10-CM | POA: Insufficient documentation

## 2019-02-22 DIAGNOSIS — D538 Other specified nutritional anemias: Secondary | ICD-10-CM | POA: Insufficient documentation

## 2019-02-22 DIAGNOSIS — Z86711 Personal history of pulmonary embolism: Secondary | ICD-10-CM | POA: Diagnosis not present

## 2019-02-22 DIAGNOSIS — Z87891 Personal history of nicotine dependence: Secondary | ICD-10-CM | POA: Insufficient documentation

## 2019-02-22 DIAGNOSIS — J449 Chronic obstructive pulmonary disease, unspecified: Secondary | ICD-10-CM | POA: Insufficient documentation

## 2019-02-22 DIAGNOSIS — Z79899 Other long term (current) drug therapy: Secondary | ICD-10-CM | POA: Diagnosis not present

## 2019-02-22 DIAGNOSIS — G4700 Insomnia, unspecified: Secondary | ICD-10-CM | POA: Diagnosis not present

## 2019-02-22 DIAGNOSIS — Z7982 Long term (current) use of aspirin: Secondary | ICD-10-CM | POA: Insufficient documentation

## 2019-02-22 DIAGNOSIS — K219 Gastro-esophageal reflux disease without esophagitis: Secondary | ICD-10-CM | POA: Insufficient documentation

## 2019-02-22 DIAGNOSIS — F419 Anxiety disorder, unspecified: Secondary | ICD-10-CM | POA: Diagnosis not present

## 2019-02-22 DIAGNOSIS — Z923 Personal history of irradiation: Secondary | ICD-10-CM | POA: Insufficient documentation

## 2019-02-22 DIAGNOSIS — M545 Low back pain: Secondary | ICD-10-CM | POA: Insufficient documentation

## 2019-02-22 DIAGNOSIS — Z5111 Encounter for antineoplastic chemotherapy: Secondary | ICD-10-CM

## 2019-02-22 DIAGNOSIS — C7931 Secondary malignant neoplasm of brain: Secondary | ICD-10-CM | POA: Insufficient documentation

## 2019-02-22 DIAGNOSIS — Z8701 Personal history of pneumonia (recurrent): Secondary | ICD-10-CM | POA: Diagnosis not present

## 2019-02-22 DIAGNOSIS — I313 Pericardial effusion (noninflammatory): Secondary | ICD-10-CM | POA: Insufficient documentation

## 2019-02-22 DIAGNOSIS — C3432 Malignant neoplasm of lower lobe, left bronchus or lung: Secondary | ICD-10-CM | POA: Diagnosis present

## 2019-02-22 DIAGNOSIS — M8580 Other specified disorders of bone density and structure, unspecified site: Secondary | ICD-10-CM | POA: Insufficient documentation

## 2019-02-22 DIAGNOSIS — E785 Hyperlipidemia, unspecified: Secondary | ICD-10-CM | POA: Insufficient documentation

## 2019-02-22 NOTE — Progress Notes (Signed)
Patient here today for bx results.

## 2019-02-22 NOTE — Progress Notes (Signed)
Patient on plan of care prior to pathways. 

## 2019-02-23 MED ORDER — ONDANSETRON HCL 8 MG PO TABS: 8.0000 mg | ORAL_TABLET | Freq: Two times a day (BID) | ORAL | 2 refills | Status: DC | PRN

## 2019-02-23 MED ORDER — PROCHLORPERAZINE MALEATE 10 MG PO TABS: 10.0000 mg | ORAL_TABLET | Freq: Four times a day (QID) | ORAL | 2 refills | Status: DC | PRN

## 2019-02-23 MED ORDER — LIDOCAINE-PRILOCAINE 2.5-2.5 % EX CREA: TOPICAL_CREAM | CUTANEOUS | 3 refills | Status: DC

## 2019-02-23 NOTE — Progress Notes (Signed)
START ON PATHWAY REGIMEN - Small Cell Lung     A cycle is every 21 days:     Etoposide      Carboplatin   **Always confirm dose/schedule in your pharmacy ordering system**  Patient Characteristics: Relapsed or Progressive Disease, Second Line, Relapse > 6 Months Therapeutic Status: Relapsed or Progressive Disease Line of Therapy: Second Line Time to Relapse: Relapse > 6 Months Intent of Therapy: Non-Curative / Palliative Intent, Discussed with Patient

## 2019-02-28 ENCOUNTER — Other Ambulatory Visit: Payer: Self-pay

## 2019-02-28 ENCOUNTER — Telehealth: Payer: Self-pay

## 2019-02-28 ENCOUNTER — Ambulatory Visit (INDEPENDENT_AMBULATORY_CARE_PROVIDER_SITE_OTHER): Payer: Managed Care, Other (non HMO) | Admitting: Cardiothoracic Surgery

## 2019-02-28 ENCOUNTER — Encounter: Payer: Self-pay | Admitting: Cardiothoracic Surgery

## 2019-02-28 ENCOUNTER — Encounter
Admission: RE | Admit: 2019-02-28 | Discharge: 2019-02-28 | Disposition: A | Discharge status: Home / Self Care | Payer: Managed Care, Other (non HMO) | Source: Ambulatory Visit | Attending: Cardiothoracic Surgery | Admitting: Cardiothoracic Surgery

## 2019-02-28 VITALS — BP 121/76 | HR 105 | Temp 97.2°F | Ht 63.0 in | Wt 145.0 lb

## 2019-02-28 DIAGNOSIS — Z9221 Personal history of antineoplastic chemotherapy: Secondary | ICD-10-CM | POA: Diagnosis not present

## 2019-02-28 DIAGNOSIS — Z01818 Encounter for other preprocedural examination: Secondary | ICD-10-CM

## 2019-02-28 DIAGNOSIS — Z87891 Personal history of nicotine dependence: Secondary | ICD-10-CM

## 2019-02-28 DIAGNOSIS — R918 Other nonspecific abnormal finding of lung field: Secondary | ICD-10-CM | POA: Diagnosis not present

## 2019-02-28 DIAGNOSIS — Z993 Dependence on wheelchair: Secondary | ICD-10-CM | POA: Diagnosis not present

## 2019-02-28 DIAGNOSIS — J449 Chronic obstructive pulmonary disease, unspecified: Secondary | ICD-10-CM | POA: Diagnosis not present

## 2019-02-28 DIAGNOSIS — Z9981 Dependence on supplemental oxygen: Secondary | ICD-10-CM | POA: Diagnosis not present

## 2019-02-28 DIAGNOSIS — C3490 Malignant neoplasm of unspecified part of unspecified bronchus or lung: Secondary | ICD-10-CM | POA: Diagnosis not present

## 2019-02-28 DIAGNOSIS — K219 Gastro-esophageal reflux disease without esophagitis: Secondary | ICD-10-CM | POA: Diagnosis not present

## 2019-02-28 DIAGNOSIS — Z923 Personal history of irradiation: Secondary | ICD-10-CM | POA: Diagnosis not present

## 2019-02-28 DIAGNOSIS — I10 Essential (primary) hypertension: Secondary | ICD-10-CM | POA: Diagnosis not present

## 2019-02-28 DIAGNOSIS — Z7982 Long term (current) use of aspirin: Secondary | ICD-10-CM | POA: Diagnosis not present

## 2019-02-28 LAB — CBC WITH DIFFERENTIAL/PLATELET
Abs Immature Granulocytes: 0.04 10*3/uL (ref 0.00–0.07)
Basophils Absolute: 0 10*3/uL (ref 0.0–0.1)
Basophils Relative: 0 %
Eosinophils Absolute: 0.1 10*3/uL (ref 0.0–0.5)
Eosinophils Relative: 1 %
HCT: 32.8 % — ABNORMAL LOW (ref 36.0–46.0)
HEMOGLOBIN: 10.1 g/dL — AB (ref 12.0–15.0)
Immature Granulocytes: 0 %
Lymphocytes Relative: 16 %
Lymphs Abs: 1.4 10*3/uL (ref 0.7–4.0)
MCH: 28.5 pg (ref 26.0–34.0)
MCHC: 30.8 g/dL (ref 30.0–36.0)
MCV: 92.7 fL (ref 80.0–100.0)
Monocytes Absolute: 0.6 10*3/uL (ref 0.1–1.0)
Monocytes Relative: 6 %
NEUTROS PCT: 77 %
Neutro Abs: 6.8 10*3/uL (ref 1.7–7.7)
Platelets: 283 10*3/uL (ref 150–400)
RBC: 3.54 MIL/uL — ABNORMAL LOW (ref 3.87–5.11)
RDW: 14.9 % (ref 11.5–15.5)
WBC: 8.9 10*3/uL (ref 4.0–10.5)
nRBC: 0 % (ref 0.0–0.2)

## 2019-02-28 LAB — COMPREHENSIVE METABOLIC PANEL
ALK PHOS: 102 U/L (ref 38–126)
ALT: 17 U/L (ref 0–44)
AST: 28 U/L (ref 15–41)
Albumin: 3.7 g/dL (ref 3.5–5.0)
Anion gap: 9 (ref 5–15)
BUN: 10 mg/dL (ref 8–23)
CALCIUM: 8.7 mg/dL — AB (ref 8.9–10.3)
CO2: 28 mmol/L (ref 22–32)
Chloride: 98 mmol/L (ref 98–111)
Creatinine, Ser: 0.69 mg/dL (ref 0.44–1.00)
GFR calc Af Amer: >60 mL/min (ref >60)
GFR calc non Af Amer: >60 mL/min (ref >60)
Glucose, Bld: 112 mg/dL — ABNORMAL HIGH (ref 70–99)
Potassium: 3.8 mmol/L (ref 3.5–5.1)
Sodium: 135 mmol/L (ref 135–145)
Total Bilirubin: 0.3 mg/dL (ref 0.3–1.2)
Total Protein: 7.7 g/dL (ref 6.5–8.1)

## 2019-02-28 LAB — PROTIME-INR
INR: 1.1 (ref 0.8–1.2)
Prothrombin Time: 13.7 seconds (ref 11.4–15.2)

## 2019-02-28 LAB — APTT: aPTT: 38 seconds — ABNORMAL HIGH (ref 24–36)

## 2019-02-28 MED ORDER — CEFAZOLIN SODIUM-DEXTROSE 2-4 GM/100ML-% IV SOLN
2.0000 g | INTRAVENOUS | Status: AC
  Administered 2019-03-01: 2 g via INTRAVENOUS

## 2019-02-28 NOTE — Patient Instructions (Signed)
Implanted Port Insertion Implanted port insertion is a procedure to put in a port and catheter. The port is a device with an injectable disk that can be accessed by your health care provider. The port is connected to a vein in the chest or neck by a small flexible tube (catheter). There are different types of ports. The implanted port may be used as a long-term IV access for:  Medicines, such as chemotherapy.  Fluids.  Liquid nutrition, such as total parenteral nutrition (TPN). When you have a port, this means that your health care provider will not need to use the veins in your arms for these procedures. Tell a health care provider about:  Any allergies you have.  All medicines you are taking, especially blood thinners, as well as any vitamins, herbs, eye drops, creams, over-the-counter medicines, and steroids.  Any problems you or family members have had with anesthetic medicines.  Any blood disorders you have.  Any surgeries you have had.  Any medical conditions you have or have had, including diabetes or kidney problems.  Whether you are pregnant or may be pregnant. What are the risks? Generally, this is a safe procedure. However, problems may occur, including:  Allergic reactions to medicines or dyes.  Damage to other structures or organs.  Infection.  Damage to the blood vessel, bruising, or bleeding at the puncture site.  Blood clot.  Breakdown of the skin over the port.  A collection of air in the chest that can cause one of the lungs to collapse (pneumothorax). This is rare. What happens before the procedure? Medicines  Ask your health care provider about: ? Changing or stopping your regular medicines. This is especially important if you are taking diabetes medicines or blood thinners. ? Taking medicines such as aspirin and ibuprofen. These medicines can thin your blood. Do not take these medicines unless your health care provider tells you to take them. ?  Taking over-the-counter medicines, vitamins, herbs, and supplements. Staying hydrated Follow instructions from your health care provider about hydration, which may include:  Up to 2 hours before the procedure - you may continue to drink clear liquids, such as water, clear fruit juice, black coffee, and plain tea.  Eating and drinking restrictions  Follow instructions from your health care provider about eating and drinking, which may include: ? 8 hours before the procedure - stop eating heavy meals or foods, such as meat, fried foods, or fatty foods. ? 6 hours before the procedure - stop eating light meals or foods, such as toast or cereal. ? 6 hours before the procedure - stop drinking milk or drinks that contain milk. ? 2 hours before the procedure - stop drinking clear liquids. General instructions  Plan to have someone take you home from the hospital or clinic.  If you will be going home right after the procedure, plan to have someone with you for 24 hours.  You may have blood tests.  Do not use any products that contain nicotine or tobacco for at least 4-6 weeks before the procedure. These products include cigarettes, e-cigarettes, and chewing tobacco. If you need help quitting, ask your health care provider.  Ask your health care provider what steps will be taken to help prevent infection. These may include: ? Removing hair at the surgery site. ? Washing skin with a germ-killing soap. ? Taking antibiotic medicine. What happens during the procedure?   An IV will be inserted into one of your veins.  You will be given  one or more of the following: ? A medicine to help you relax (sedative). ? A medicine to numb the area (local anesthetic).  Two small incisions will be made to insert the port. ? One smaller incision will be made in your neck to get access to the vein where the catheter will lie. ? The other incision will be made in the upper chest. This is where the port will  lie.  The procedure may be done using continuous X-ray (fluoroscopy) or other imaging tools for guidance.  The port and catheter will be placed. There may be a small, raised area where the port is.  The port will be flushed with a salt solution (saline), and blood will be drawn to make sure that it is working correctly.  The incisions will be closed.  Bandages (dressings) may be placed over the incisions. The procedure may vary among health care providers and hospitals. What happens after the procedure?  Your blood pressure, heart rate, breathing rate, and blood oxygen level will be monitored until you leave the hospital or clinic.  Do not drive for 24 hours if you were given a sedative during your procedure.  You will be given a manufacturer's information card for the type of port that you have. Keep this with you.  Your port will need to be flushed and checked as told by your health care provider, usually every few weeks.  A chest X-ray will be done to: ? Check the placement of the port. ? Make sure there is no injury to your lung. Summary  Implanted port insertion is a procedure to put in a port and catheter.  The implanted port is used as a long-term IV access.  The port will need to be flushed and checked as told by your health care provider, usually every few weeks.  Keep your manufacturer's information card with you at all times. This information is not intended to replace advice given to you by your health care provider. Make sure you discuss any questions you have with your health care provider. Document Released: 09/14/2013 Document Revised: 06/22/2018 Document Reviewed: 06/22/2018 Elsevier Interactive Patient Education  [employer].

## 2019-02-28 NOTE — H&P (View-Only) (Signed)
  Patient ID: Jill Gross, female   DOB: July 03, 1956, 63 y.o.   MRN: [id number]  HISTORY: She comes in today to discuss placement of a Port-A-Cath.  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  She has extensive disease present in her left chest.  She recently underwent a bronchoscopy and biopsy and met with Dr. Delight Hoh who is recommended she undergo chemotherapy.  She is agreeable to doing that.  She comes in today to discuss Port-A-Cath placement.  Patient is currently in a wheelchair and is wheelchair-bound.  She is unable to walk.  She states that she gets quite short of breath and also loses her balance.  She is on aspirin but no other anticoagulants.   Vitals:   02/28/19 0958  BP: 121/76  Pulse: (!) 105  Temp: (!) 97.2 F (36.2 C)  SpO2: 99%     EXAM:    Resp: Lungs are clear on the right and markedly diminished on the left..  No respiratory distress, normal effort. Heart:  Regular without murmurs Abd:  Abdomen is soft, non distended and non tender. No masses are palpable.  There is no rebound and no guarding.  Neurological: Alert and oriented to person, place, and time. Coordination normal.  Skin: Skin is warm and dry. No rash noted. No diaphoretic. No erythema. No pallor.  Psychiatric: Normal mood and affect. Normal behavior. Judgment and thought content normal.    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  Risks of bleeding, infection, pneumothorax and death were all reviewed.  She would like [place] to proceed.    [name], MD

## 2019-02-28 NOTE — Pre-Procedure Instructions (Signed)
Met B, CBC, and PTT results sent to Dr. Genevive Bi and Anesthesia for review.

## 2019-02-28 NOTE — Patient Instructions (Signed)
Your procedure is scheduled on: March 01, 2019 TUESDAY Report to Day Surgery on the 2nd [facility] AT 6:15 AM    REMEMBER: Instructions that are not followed completely may result in serious medical risk, up to and including death; or upon the discretion of your surgeon and anesthesiologist your surgery may need to be rescheduled.  Do not eat food after midnight the night before surgery.  No gum chewing, lozengers or hard candies.  You may however, drink CLEAR liquids up to 2 hours before you are scheduled to arrive for your surgery. Do not drink anything within 2 hours of the start of your surgery.  Clear liquids include: - water  - apple juice without pulp -CLEAR  gatorade - black coffee or tea (Do NOT add milk or creamers to the coffee or tea) Do NOT drink anything that is not on this list.  Type 1 and Type 2 diabetics should only drink water.  No Alcohol for 24 hours before or after surgery.  No Smoking including e-cigarettes for 24 hours prior to surgery.  No chewable tobacco products for at least 6 hours prior to surgery.  No nicotine patches on the day of surgery.  On the morning of surgery brush your teeth with toothpaste and water, you may rinse your mouth with mouthwash if you wish. Do not swallow any toothpaste or mouthwash.  Notify your doctor if there is any change in your medical condition (cold, fever, infection).  Do not wear jewelry, make-up, hairpins, clips or nail polish.  Do not wear lotions, powders, or perfumes OR DEODORANT   Do not shave 48 hours prior to surgery.   Contacts and dentures may not be worn into surgery.  Do not bring valuables to the hospital, including drivers license, insurance or credit cards.  [facility] is not responsible for any belongings or valuables.   TAKE THESE MEDICATIONS THE MORNING OF SURGERY: LEVOTHYROXINE ZANTAC (take one the night before and one on the morning of surgery - helps to prevent nausea after  surgery.)  Use CHG  wipes as directed on instruction sheet.  Use inhalers on the day of surgery  OR BREATHING TREATMENT   Follow recommendations from Cardiologist, Pulmonologist or PCP regarding stopping Aspirin, Coumadin, Plavix, Eliquis, Pradaxa, or Pletal.  Stop Anti-inflammatories (NSAIDS) such as Advil, Aleve, Ibuprofen, Motrin, Naproxen, Naprosyn and Aspirin based products such as Excedrin, Goodys Powder, BC Powder. (May take Tylenol or Acetaminophen if needed.)  Stop ANY OVER THE COUNTER supplements until after surgery. (May continue Vitamin D, Vitamin B, and multivitamin.)  Wear comfortable clothing (specific to your surgery type) to the hospital.  Plan for stool softeners for home use.  If you are being discharged the day of surgery, you will not be allowed to drive home. You will need a responsible adult to drive you home and stay with you that night.   If you are taking public transportation, you will need to have a responsible adult with you. Please confirm with your physician that it is acceptable to use public transportation.   Please call [phone] if you have any questions about these instructions.

## 2019-02-28 NOTE — Telephone Encounter (Signed)
Patient's surgery has been scheduled for 03/01/19 at [facility] with Dr. Genevive Bi  The patient is aware to Pre-Admit on 02/28/19 at 12:15 pm. Patient will check in at the [place], [address] (first floor).   The patient is aware to call the office should he/she have further questions.

## 2019-02-28 NOTE — Progress Notes (Signed)
  Patient ID: Jill Gross, female   DOB: [birth year], 63 y.o.   MRN: [id number]  HISTORY: She comes in today to discuss placement of a Port-A-Cath.  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  She has extensive disease present in her left chest.  She recently underwent a bronchoscopy and biopsy and met with Dr. Delight Hoh who is recommended she undergo chemotherapy.  She is agreeable to doing that.  She comes in today to discuss Port-A-Cath placement.  Patient is currently in a wheelchair and is wheelchair-bound.  She is unable to walk.  She states that she gets quite short of breath and also loses her balance.  She is on aspirin but no other anticoagulants.   Vitals:   02/28/19 0958  BP: 121/76  Pulse: (!) 105  Temp: (!) 97.2 F (36.2 C)  SpO2: 99%     EXAM:    Resp: Lungs are clear on the right and markedly diminished on the left..  No respiratory distress, normal effort. Heart:  Regular without murmurs Abd:  Abdomen is soft, non distended and non tender. No masses are palpable.  There is no rebound and no guarding.  Neurological: Alert and oriented to person, place, and time. Coordination normal.  Skin: Skin is warm and dry. No rash noted. No diaphoretic. No erythema. No pallor.  Psychiatric: Normal mood and affect. Normal behavior. Judgment and thought content normal.    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  Risks of bleeding, infection, pneumothorax and death were all reviewed.  She would like [place] to proceed.    [name], MD

## 2019-03-01 ENCOUNTER — Ambulatory Visit: Payer: Managed Care, Other (non HMO)

## 2019-03-01 ENCOUNTER — Ambulatory Visit: Payer: Managed Care, Other (non HMO) | Admitting: Anesthesiology

## 2019-03-01 ENCOUNTER — Other Ambulatory Visit: Payer: Self-pay

## 2019-03-01 ENCOUNTER — Ambulatory Visit
Admission: RE | Admit: 2019-03-01 | Discharge: 2019-03-01 | Disposition: A | Discharge status: Home / Self Care | Payer: Managed Care, Other (non HMO) | Attending: Cardiothoracic Surgery | Admitting: Cardiothoracic Surgery

## 2019-03-01 ENCOUNTER — Encounter
Admission: RE | Disposition: A | Discharge status: Home / Self Care | Payer: Self-pay | Source: Home / Self Care | Attending: Cardiothoracic Surgery

## 2019-03-01 DIAGNOSIS — Z87891 Personal history of nicotine dependence: Secondary | ICD-10-CM | POA: Insufficient documentation

## 2019-03-01 DIAGNOSIS — J449 Chronic obstructive pulmonary disease, unspecified: Secondary | ICD-10-CM | POA: Insufficient documentation

## 2019-03-01 DIAGNOSIS — R918 Other nonspecific abnormal finding of lung field: Secondary | ICD-10-CM | POA: Diagnosis not present

## 2019-03-01 DIAGNOSIS — C3490 Malignant neoplasm of unspecified part of unspecified bronchus or lung: Secondary | ICD-10-CM | POA: Insufficient documentation

## 2019-03-01 DIAGNOSIS — K219 Gastro-esophageal reflux disease without esophagitis: Secondary | ICD-10-CM | POA: Insufficient documentation

## 2019-03-01 DIAGNOSIS — Z993 Dependence on wheelchair: Secondary | ICD-10-CM | POA: Insufficient documentation

## 2019-03-01 DIAGNOSIS — Z09 Encounter for follow-up examination after completed treatment for conditions other than malignant neoplasm: Secondary | ICD-10-CM

## 2019-03-01 DIAGNOSIS — Z9981 Dependence on supplemental oxygen: Secondary | ICD-10-CM | POA: Insufficient documentation

## 2019-03-01 DIAGNOSIS — I10 Essential (primary) hypertension: Secondary | ICD-10-CM | POA: Insufficient documentation

## 2019-03-01 DIAGNOSIS — Z7982 Long term (current) use of aspirin: Secondary | ICD-10-CM | POA: Insufficient documentation

## 2019-03-01 DIAGNOSIS — Z9221 Personal history of antineoplastic chemotherapy: Secondary | ICD-10-CM | POA: Insufficient documentation

## 2019-03-01 DIAGNOSIS — Z923 Personal history of irradiation: Secondary | ICD-10-CM | POA: Insufficient documentation

## 2019-03-01 HISTORY — PX: PORTACATH PLACEMENT: SHX2246

## 2019-03-01 SURGERY — INSERTION, TUNNELED CENTRAL VENOUS DEVICE, WITH PORT
Anesthesia: General

## 2019-03-01 MED ORDER — CHLORHEXIDINE GLUCONATE CLOTH 2 % EX PADS: 6.0000 | MEDICATED_PAD | Freq: Once | CUTANEOUS | Status: DC

## 2019-03-01 MED ORDER — LIDOCAINE HCL (PF) 1 % IJ SOLN
INTRAMUSCULAR | Status: AC
  Filled 2019-03-01: qty 30

## 2019-03-01 MED ORDER — LIDOCAINE HCL 1 % IJ SOLN
INTRAMUSCULAR | Status: DC | PRN
  Administered 2019-03-01: 8 mL

## 2019-03-01 MED ORDER — FENTANYL CITRATE (PF) 100 MCG/2ML IJ SOLN
INTRAMUSCULAR | Status: DC | PRN
  Administered 2019-03-01 (×4): 25 ug via INTRAVENOUS

## 2019-03-01 MED ORDER — ONDANSETRON HCL 4 MG/2ML IJ SOLN
4.0000 mg | Freq: Once | INTRAMUSCULAR | Status: AC | PRN
  Administered 2019-03-01: 4 mg via INTRAVENOUS

## 2019-03-01 MED ORDER — ONDANSETRON HCL 4 MG/2ML IJ SOLN
INTRAMUSCULAR | Status: AC
  Filled 2019-03-01: qty 2

## 2019-03-01 MED ORDER — MIDAZOLAM HCL 2 MG/2ML IJ SOLN
INTRAMUSCULAR | Status: DC | PRN
  Administered 2019-03-01: 1 mg via INTRAVENOUS

## 2019-03-01 MED ORDER — HEPARIN SODIUM (PORCINE) 5000 UNIT/ML IJ SOLN
INTRAMUSCULAR | Status: AC
  Filled 2019-03-01: qty 1

## 2019-03-01 MED ORDER — FENTANYL CITRATE (PF) 100 MCG/2ML IJ SOLN
INTRAMUSCULAR | Status: AC
  Filled 2019-03-01: qty 2

## 2019-03-01 MED ORDER — LIDOCAINE HCL (CARDIAC) PF 100 MG/5ML IV SOSY
PREFILLED_SYRINGE | INTRAVENOUS | Status: DC | PRN
  Administered 2019-03-01: 60 mg via INTRAVENOUS

## 2019-03-01 MED ORDER — LIDOCAINE HCL (PF) 2 % IJ SOLN
INTRAMUSCULAR | Status: AC
  Filled 2019-03-01: qty 10

## 2019-03-01 MED ORDER — PHENYLEPHRINE HCL 10 MG/ML IJ SOLN
INTRAMUSCULAR | Status: DC | PRN
  Administered 2019-03-01: 100 ug via INTRAVENOUS
  Administered 2019-03-01: 200 ug via INTRAVENOUS
  Administered 2019-03-01: 100 ug via INTRAVENOUS
  Administered 2019-03-01 (×2): 200 ug via INTRAVENOUS

## 2019-03-01 MED ORDER — CEFAZOLIN SODIUM-DEXTROSE 2-4 GM/100ML-% IV SOLN
INTRAVENOUS | Status: AC
  Filled 2019-03-01: qty 100

## 2019-03-01 MED ORDER — PROPOFOL 10 MG/ML IV BOLUS
INTRAVENOUS | Status: DC | PRN
  Administered 2019-03-01: 110 mg via INTRAVENOUS

## 2019-03-01 MED ORDER — PROPOFOL 10 MG/ML IV BOLUS
INTRAVENOUS | Status: AC
  Filled 2019-03-01: qty 20

## 2019-03-01 MED ORDER — ONDANSETRON HCL 4 MG/2ML IJ SOLN
INTRAMUSCULAR | Status: DC | PRN
  Administered 2019-03-01: 4 mg via INTRAVENOUS

## 2019-03-01 MED ORDER — DEXAMETHASONE SODIUM PHOSPHATE 10 MG/ML IJ SOLN
INTRAMUSCULAR | Status: AC
  Filled 2019-03-01: qty 1

## 2019-03-01 MED ORDER — MIDAZOLAM HCL 2 MG/2ML IJ SOLN
INTRAMUSCULAR | Status: AC
  Filled 2019-03-01: qty 2

## 2019-03-01 MED ORDER — SODIUM CHLORIDE 0.9 % IV SOLN
INTRAVENOUS | Status: DC | PRN
  Administered 2019-03-01: 8 mL via INTRAMUSCULAR

## 2019-03-01 MED ORDER — LACTATED RINGERS IV SOLN
INTRAVENOUS | Status: DC
  Administered 2019-03-01: 07:00:00 via INTRAVENOUS

## 2019-03-01 MED ORDER — FENTANYL CITRATE (PF) 100 MCG/2ML IJ SOLN: 25.0000 ug | INTRAMUSCULAR | Status: DC | PRN

## 2019-03-01 MED ORDER — DEXAMETHASONE SODIUM PHOSPHATE 10 MG/ML IJ SOLN
INTRAMUSCULAR | Status: DC | PRN
  Administered 2019-03-01: 5 mg via INTRAVENOUS

## 2019-03-01 SURGICAL SUPPLY — 47 items
APL PRP STRL LF DISP 70% ISPRP (MISCELLANEOUS) ×1
BAG DECANTER FOR FLEXI CONT (MISCELLANEOUS) ×2 IMPLANT
BLADE SURG SZ11 CARB STEEL (BLADE) ×2 IMPLANT
CANISTER SUCT 1200ML W/VALVE (MISCELLANEOUS) ×2 IMPLANT
CHLORAPREP W/TINT 26 (MISCELLANEOUS) ×2 IMPLANT
COVER LIGHT HANDLE STERIS (MISCELLANEOUS) ×4 IMPLANT
DRAPE C-ARM XRAY 36X54 (DRAPES) ×2 IMPLANT
DRSG TEGADERM 2-3/8X2-3/4 SM (GAUZE/BANDAGES/DRESSINGS) ×2 IMPLANT
DRSG TEGADERM 4X4.75 (GAUZE/BANDAGES/DRESSINGS) ×2 IMPLANT
DRSG TEGADERM 6X8 (GAUZE/BANDAGES/DRESSINGS) ×2 IMPLANT
DRSG TELFA 4X3 1S NADH ST (GAUZE/BANDAGES/DRESSINGS) ×2 IMPLANT
ELECT CAUTERY BLADE TIP 2.5 (TIP) ×2
ELECT REM PT RETURN 9FT ADLT (ELECTROSURGICAL) ×2
ELECTRODE CAUTERY BLDE TIP 2.5 (TIP) ×1 IMPLANT
ELECTRODE REM PT RTRN 9FT ADLT (ELECTROSURGICAL) ×1 IMPLANT
GAUZE SPONGE 4X4 12PLY STRL (GAUZE/BANDAGES/DRESSINGS) ×1 IMPLANT
GLOVE SURG SYN 7.5  E (GLOVE) ×1
GLOVE SURG SYN 7.5 E (GLOVE) ×1 IMPLANT
GLOVE SURG SYN 7.5 PF PI (GLOVE) ×1 IMPLANT
GOWN STRL REUS W/ TWL LRG LVL3 (GOWN DISPOSABLE) ×2 IMPLANT
GOWN STRL REUS W/TWL LRG LVL3 (GOWN DISPOSABLE) ×4
IV NS 500ML (IV SOLUTION) ×2
IV NS 500ML BAXH (IV SOLUTION) ×1 IMPLANT
KIT PORT POWER 8FR ISP CVUE (Port) ×2 IMPLANT
KIT TURNOVER KIT A (KITS) ×2 IMPLANT
LABEL OR SOLS (LABEL) ×2 IMPLANT
MARKER SKIN DUAL TIP RULER LAB (MISCELLANEOUS) ×2 IMPLANT
NDL FILTER BLUNT 18X1 1/2 (NEEDLE) ×1 IMPLANT
NEEDLE FILTER BLUNT 18X 1/2SAF (NEEDLE) ×1
NEEDLE FILTER BLUNT 18X1 1/2 (NEEDLE) ×1 IMPLANT
NEEDLE HYPO 22GX1.5 SAFETY (NEEDLE) ×4 IMPLANT
NS IRRIG 500ML POUR BTL (IV SOLUTION) ×2 IMPLANT
PACK PORT-A-CATH (MISCELLANEOUS) ×2 IMPLANT
STRIP CLOSURE SKIN 1/4X4 (GAUZE/BANDAGES/DRESSINGS) ×1 IMPLANT
SUT ETHILON 4-0 (SUTURE) ×4
SUT ETHILON 4-0 FS2 18XMFL BLK (SUTURE) ×2
SUT PROLENE 2 0 SH DA (SUTURE) ×4 IMPLANT
SUT VIC AB 2-0 SH 27 (SUTURE) ×2
SUT VIC AB 2-0 SH 27XBRD (SUTURE) ×1 IMPLANT
SUT VIC AB 3-0 SH 27 (SUTURE) ×2
SUT VIC AB 3-0 SH 27X BRD (SUTURE) ×1 IMPLANT
SUTURE ETHLN 4-0 FS2 18XMF BLK (SUTURE) ×2 IMPLANT
SYR 10ML LL (SYRINGE) ×1 IMPLANT
SYR 10ML SLIP (SYRINGE) ×2 IMPLANT
SYR 3ML LL SCALE MARK (SYRINGE) ×2 IMPLANT
TAPE CLOTH 3X10 WHT NS LF (GAUZE/BANDAGES/DRESSINGS) IMPLANT
TAPE TRANSPORE STRL 2 31045 (GAUZE/BANDAGES/DRESSINGS) ×2 IMPLANT

## 2019-03-01 NOTE — Interval H&P Note (Signed)
History and Physical Interval Note:  03/01/2019 7:10 AM  Jill Gross  has presented today for surgery, with the diagnosis of LUNG CANCER.  The various methods of treatment have been discussed with the patient and family. After consideration of risks, benefits and other options for treatment, the patient has consented to  Procedure(s): INSERTION PORT-A-CATH (N/A) as a surgical intervention.  The patient's history has been reviewed, patient examined, no change in status, stable for surgery.  I have reviewed the patient's chart and labs.  Questions were answered to the patient's satisfaction.     [name]

## 2019-03-01 NOTE — Anesthesia Post-op Follow-up Note (Signed)
Anesthesia QCDR form completed.        

## 2019-03-01 NOTE — Anesthesia Procedure Notes (Signed)
Procedure Name: LMA Insertion Date/Time: 03/01/2019 7:41 AM Performed by: Hedda Slade, CRNA Pre-anesthesia Checklist: Patient identified, Patient being monitored, Timeout performed, Emergency Drugs available and Suction available Patient Re-evaluated:Patient Re-evaluated prior to induction Oxygen Delivery Method: Circle system utilized Preoxygenation: Pre-oxygenation with 100% oxygen Induction Type: IV induction Ventilation: Mask ventilation without difficulty LMA: LMA inserted LMA Size: 3.5 Tube type: Oral Number of attempts: 1 Placement Confirmation: positive ETCO2 and breath sounds checked- equal and bilateral Tube secured with: Tape Dental Injury: Teeth and Oropharynx as per pre-operative assessment

## 2019-03-01 NOTE — Transfer of Care (Signed)
Immediate Anesthesia Transfer of Care Note  Patient: Jill Gross  Procedure(s) Performed: INSERTION PORT-A-CATH (N/A )  Patient Location: PACU  Anesthesia Type:General  Level of Consciousness: sedated  Airway & Oxygen Therapy: Patient Spontanous Breathing and Patient connected to face mask oxygen  Post-op Assessment: Report given to RN and Post -op Vital signs reviewed and stable  Post vital signs: Reviewed and stable  Last Vitals:  Vitals Value Taken Time  BP 118/51 03/01/2019  8:49 AM  Temp 36.3 C 03/01/2019  8:49 AM  Pulse 77 03/01/2019  8:50 AM  Resp 12 03/01/2019  8:50 AM  SpO2 100 % 03/01/2019  8:50 AM  Vitals shown include unvalidated device data.  Last Pain:  Vitals:   03/01/19 0849  PainSc: 0-No pain         Complications: No apparent anesthesia complications

## 2019-03-01 NOTE — Anesthesia Postprocedure Evaluation (Signed)
Anesthesia Post Note  Patient: Jill Gross  Procedure(s) Performed: INSERTION PORT-A-CATH (N/A )  Patient location during evaluation: PACU Anesthesia Type: General Level of consciousness: awake and alert Pain management: pain level controlled Vital Signs Assessment: post-procedure vital signs reviewed and stable Respiratory status: spontaneous breathing and respiratory function stable Cardiovascular status: stable Anesthetic complications: no     Last Vitals:  Vitals:   03/01/19 0919 03/01/19 0935  BP: (!) 110/52 115/65  Pulse: 81 95  Resp: (!) 22 17  Temp:  (!) 36.1 C  SpO2: 100% 100%    Last Pain:  Vitals:   03/01/19 0935  PainSc: 0-No pain                 KEPHART,WILLIAM K

## 2019-03-01 NOTE — Anesthesia Preprocedure Evaluation (Signed)
Anesthesia Evaluation  Patient identified by MRN, date of birth, ID band Patient awake    Reviewed: Allergy & Precautions, NPO status , Patient's Chart, lab work & pertinent test results  History of Anesthesia Complications (+) Family history of anesthesia reactionNegative for: history of anesthetic complications  Airway Mallampati: II       Dental   Pulmonary neg sleep apnea, COPD (2 L O2 all the time),  COPD inhaler and oxygen dependent, former smoker,           Cardiovascular hypertension, Pt. on medications (-) Past MI and (-) CHF (-) dysrhythmias (-) Valvular Problems/Murmurs     Neuro/Psych neg Seizures Anxiety Depression    GI/Hepatic Neg liver ROS, GERD  Medicated and Controlled,  Endo/Other  neg diabetesHypothyroidism   Renal/GU negative Renal ROS     Musculoskeletal   Abdominal   Peds  Hematology  (+) anemia ,   Anesthesia Other Findings   Reproductive/Obstetrics                             Anesthesia Physical Anesthesia Plan  ASA: III  Anesthesia Plan: General   Post-op Pain Management:    Induction: Intravenous  PONV Risk Score and Plan: 3 and Ondansetron, Dexamethasone and Midazolam  Airway Management Planned: LMA  Additional Equipment:   Intra-op Plan:   Post-operative Plan:   Informed Consent: I have reviewed the patients History and Physical, chart, labs and discussed the procedure including the risks, benefits and alternatives for the proposed anesthesia with the patient or authorized representative who has indicated his/her understanding and acceptance.       Plan Discussed with:   Anesthesia Plan Comments:         Anesthesia Quick Evaluation

## 2019-03-01 NOTE — Discharge Instructions (Signed)

## 2019-03-01 NOTE — Op Note (Signed)
03/01/2019  9:08 AM  PATIENT:  Jill Gross  63 y.o. female  PRE-OPERATIVE DIAGNOSIS:  LUNG CANCER  POST-OPERATIVE DIAGNOSIS:  LUNG CANCER  PROCEDURE:  Procedure(s): INSERTION PORT-A-CATH (N/A)  SURGEON:  Surgeon(s) and Role:    * [name], MD - Primary  ASSISTANTS:    ANESTHESIA:     DICTATION:   The patient was brought to the operating suite and placed in the supine position.  Laryngeal mask airway was established.  The patient was then prepped and draped in usual sterile fashion. The left subclavian vein was percutaneously catheterized. A wire was placed into the venous system under fluoroscopic guidance. An appropriate site was selected on the chest wall and a Port-A-Cath pocket was created. The catheter was tunneled from the port site up to the insertion site. The catheter was then inserted through a peel-away sheath and positioned at the appropriate level in the superior vena cava. The catheter was then assembled and aspirated and flushed easily. It was then secured to the anterior chest wall with interrupted Prolene sutures. The catheter was flushed one last time and the wounds were then closed. The subcutaneous tissues were closed with running absorbable sutures and the skin with nylon.  The catheter was accessed with the [facility] needle which was then placed under the dressings.  Sterile dressings were applied. Patient was then transported to the recovery room in stable condition.   [name], MD

## 2019-03-02 ENCOUNTER — Other Ambulatory Visit: Payer: Self-pay

## 2019-03-02 ENCOUNTER — Encounter: Payer: Self-pay | Admitting: Oncology

## 2019-03-02 ENCOUNTER — Inpatient Hospital Stay: Payer: Managed Care, Other (non HMO)

## 2019-03-02 ENCOUNTER — Inpatient Hospital Stay (HOSPITAL_BASED_OUTPATIENT_CLINIC_OR_DEPARTMENT_OTHER): Payer: Managed Care, Other (non HMO) | Admitting: Oncology

## 2019-03-02 VITALS — Resp 18

## 2019-03-02 VITALS — BP 115/56 | HR 93 | Temp 95.9°F | Wt 143.8 lb

## 2019-03-02 DIAGNOSIS — Z923 Personal history of irradiation: Secondary | ICD-10-CM

## 2019-03-02 DIAGNOSIS — G629 Polyneuropathy, unspecified: Secondary | ICD-10-CM

## 2019-03-02 DIAGNOSIS — M545 Low back pain: Secondary | ICD-10-CM

## 2019-03-02 DIAGNOSIS — C3432 Malignant neoplasm of lower lobe, left bronchus or lung: Secondary | ICD-10-CM

## 2019-03-02 DIAGNOSIS — D649 Anemia, unspecified: Secondary | ICD-10-CM

## 2019-03-02 DIAGNOSIS — C7951 Secondary malignant neoplasm of bone: Secondary | ICD-10-CM | POA: Diagnosis not present

## 2019-03-02 DIAGNOSIS — E063 Autoimmune thyroiditis: Secondary | ICD-10-CM

## 2019-03-02 DIAGNOSIS — F419 Anxiety disorder, unspecified: Secondary | ICD-10-CM

## 2019-03-02 DIAGNOSIS — E785 Hyperlipidemia, unspecified: Secondary | ICD-10-CM

## 2019-03-02 DIAGNOSIS — Z8701 Personal history of pneumonia (recurrent): Secondary | ICD-10-CM

## 2019-03-02 DIAGNOSIS — Z95828 Presence of other vascular implants and grafts: Secondary | ICD-10-CM

## 2019-03-02 DIAGNOSIS — J449 Chronic obstructive pulmonary disease, unspecified: Secondary | ICD-10-CM

## 2019-03-02 DIAGNOSIS — D538 Other specified nutritional anemias: Secondary | ICD-10-CM | POA: Diagnosis not present

## 2019-03-02 DIAGNOSIS — Z5111 Encounter for antineoplastic chemotherapy: Secondary | ICD-10-CM | POA: Diagnosis not present

## 2019-03-02 DIAGNOSIS — M8580 Other specified disorders of bone density and structure, unspecified site: Secondary | ICD-10-CM

## 2019-03-02 DIAGNOSIS — Z86711 Personal history of pulmonary embolism: Secondary | ICD-10-CM

## 2019-03-02 DIAGNOSIS — G4700 Insomnia, unspecified: Secondary | ICD-10-CM

## 2019-03-02 DIAGNOSIS — K219 Gastro-esophageal reflux disease without esophagitis: Secondary | ICD-10-CM

## 2019-03-02 DIAGNOSIS — Z87891 Personal history of nicotine dependence: Secondary | ICD-10-CM

## 2019-03-02 DIAGNOSIS — Z79899 Other long term (current) drug therapy: Secondary | ICD-10-CM

## 2019-03-02 DIAGNOSIS — Z7982 Long term (current) use of aspirin: Secondary | ICD-10-CM

## 2019-03-02 DIAGNOSIS — I313 Pericardial effusion (noninflammatory): Secondary | ICD-10-CM

## 2019-03-02 DIAGNOSIS — Z9221 Personal history of antineoplastic chemotherapy: Secondary | ICD-10-CM

## 2019-03-02 LAB — CBC WITH DIFFERENTIAL/PLATELET
ABS IMMATURE GRANULOCYTES: 0.06 10*3/uL (ref 0.00–0.07)
Basophils Absolute: 0 10*3/uL (ref 0.0–0.1)
Basophils Relative: 0 %
Eosinophils Absolute: 0 10*3/uL (ref 0.0–0.5)
Eosinophils Relative: 0 %
HCT: 28.2 % — ABNORMAL LOW (ref 36.0–46.0)
Hemoglobin: 8.9 g/dL — ABNORMAL LOW (ref 12.0–15.0)
Immature Granulocytes: 1 %
LYMPHS ABS: 1 10*3/uL (ref 0.7–4.0)
Lymphocytes Relative: 9 %
MCH: 28.2 pg (ref 26.0–34.0)
MCHC: 31.6 g/dL (ref 30.0–36.0)
MCV: 89.2 fL (ref 80.0–100.0)
Monocytes Absolute: 0.4 10*3/uL (ref 0.1–1.0)
Monocytes Relative: 4 %
Neutro Abs: 9.4 10*3/uL — ABNORMAL HIGH (ref 1.7–7.7)
Neutrophils Relative %: 86 %
Platelets: 238 10*3/uL (ref 150–400)
RBC: 3.16 MIL/uL — ABNORMAL LOW (ref 3.87–5.11)
RDW: 14.8 % (ref 11.5–15.5)
WBC: 10.9 10*3/uL — ABNORMAL HIGH (ref 4.0–10.5)
nRBC: 0 % (ref 0.0–0.2)

## 2019-03-02 LAB — COMPREHENSIVE METABOLIC PANEL
ALBUMIN: 3.3 g/dL — AB (ref 3.5–5.0)
ALT: 14 U/L (ref 0–44)
AST: 27 U/L (ref 15–41)
Alkaline Phosphatase: 90 U/L (ref 38–126)
Anion gap: 9 (ref 5–15)
BUN: 9 mg/dL (ref 8–23)
CO2: 25 mmol/L (ref 22–32)
Calcium: 9 mg/dL (ref 8.9–10.3)
Chloride: 99 mmol/L (ref 98–111)
Creatinine, Ser: 0.76 mg/dL (ref 0.44–1.00)
GFR calc Af Amer: >60 mL/min (ref >60)
GFR calc non Af Amer: >60 mL/min (ref >60)
GLUCOSE: 165 mg/dL — AB (ref 70–99)
Potassium: 3.6 mmol/L (ref 3.5–5.1)
SODIUM: 133 mmol/L — AB (ref 135–145)
Total Bilirubin: 0.2 mg/dL — ABNORMAL LOW (ref 0.3–1.2)
Total Protein: 7.5 g/dL (ref 6.5–8.1)

## 2019-03-02 MED ORDER — SODIUM CHLORIDE 0.9% FLUSH
10.0000 mL | INTRAVENOUS | Status: DC | PRN
  Administered 2019-03-02: 10 mL
  Filled 2019-03-02: qty 10

## 2019-03-02 MED ORDER — SODIUM CHLORIDE 0.9 % IV SOLN
10.0000 mg | Freq: Once | INTRAVENOUS | Status: DC
  Filled 2019-03-02: qty 1

## 2019-03-02 MED ORDER — HEPARIN SOD (PORK) LOCK FLUSH 100 UNIT/ML IV SOLN
500.0000 [IU] | Freq: Once | INTRAVENOUS | Status: AC | PRN
  Administered 2019-03-02: 500 [IU]
  Filled 2019-03-02: qty 5

## 2019-03-02 MED ORDER — SODIUM CHLORIDE 0.9% FLUSH
10.0000 mL | Freq: Once | INTRAVENOUS | Status: AC
  Administered 2019-03-02: 10 mL via INTRAVENOUS
  Filled 2019-03-02: qty 10

## 2019-03-02 MED ORDER — PALONOSETRON HCL INJECTION 0.25 MG/5ML
0.2500 mg | Freq: Once | INTRAVENOUS | Status: AC
  Administered 2019-03-02: 0.25 mg via INTRAVENOUS
  Filled 2019-03-02: qty 5

## 2019-03-02 MED ORDER — SODIUM CHLORIDE 0.9 % IV SOLN
80.0000 mg/m2 | Freq: Once | INTRAVENOUS | Status: AC
  Administered 2019-03-02: 140 mg via INTRAVENOUS
  Filled 2019-03-02: qty 7

## 2019-03-02 MED ORDER — SODIUM CHLORIDE 0.9 % IV SOLN
Freq: Once | INTRAVENOUS | Status: AC
  Administered 2019-03-02: 10:00:00 via INTRAVENOUS
  Filled 2019-03-02: qty 250

## 2019-03-02 MED ORDER — DEXAMETHASONE SODIUM PHOSPHATE 10 MG/ML IJ SOLN
10.0000 mg | Freq: Once | INTRAMUSCULAR | Status: AC
  Administered 2019-03-02: 10 mg via INTRAVENOUS
  Filled 2019-03-02: qty 1

## 2019-03-02 MED ORDER — SODIUM CHLORIDE 0.9 % IV SOLN
510.0000 mg | Freq: Once | INTRAVENOUS | Status: AC
  Administered 2019-03-02: 510 mg via INTRAVENOUS
  Filled 2019-03-02: qty 51

## 2019-03-02 NOTE — Progress Notes (Signed)
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  INTERVAL HISTORY: Patient returns to clinic today for further evaluation and initiation of cycle 1, day 1 of carboplatinum and etoposide.  She continues to be anxious, but otherwise feels well.  She continues to have chronic shortness of breath and requires oxygen 24 hours/day. She does not complain of increased cough, hemoptysis, or chest pain.  She has no neurologic complaints.  She denies any recent fevers or illnesses.  She has a fair appetite and denies weight loss.  She denies any nausea, vomiting, constipation, or diarrhea. She has no urinary complaints.  Patient offers no further specific complaints today.  REVIEW OF SYSTEMS:   Review of Systems  Constitutional: Positive for malaise/fatigue. Negative for fever and weight loss.  HENT: Negative for sore throat.   Eyes: Negative.   Respiratory: Positive for shortness of breath. Negative for cough and hemoptysis.   Cardiovascular: Negative.  Negative for chest pain and leg swelling.  Gastrointestinal: Negative for nausea and vomiting.  Genitourinary: Negative.  Negative for dysuria.  Musculoskeletal: Negative.  Negative for back pain.  Skin: Negative.   Neurological: Positive for weakness. Negative for dizziness, tingling, sensory change and focal weakness.  Psychiatric/Behavioral: Negative for depression. The patient is nervous/anxious.     As per HPI. Otherwise, a complete review of systems is negative.  PAST MEDICAL  HISTORY: Past Medical History:  Diagnosis Date   Allergy    Anemia    pernicious   Anxiety    Arthritis    Atrophic vaginitis    Back pain    COPD (chronic obstructive pulmonary disease) (HCC)    Depression    Diverticulosis    Dyspnea    Family history of adverse reaction to anesthesia    MOM-BUT UNSURE WHAT REACTION WAS   Fatigue    GERD (gastroesophageal reflux disease)    H/O degenerative disc disease    Hashimoto's disease    History of chemotherapy 04/2016   Hyperlipidemia    Hypothyroid    Insomnia    Low back pain    Lumbago    Neuropathy due to chemotherapeutic drug ([place])    Osteopenia    Pneumonia 11/2016   Small cell carcinoma of left lung ([place]) 01/2016   rad + chemo tx's   Tobacco use    Vitamin B12 deficiency     PAST SURGICAL HISTORY: Past Surgical History:  Procedure Laterality Date   ABDOMINAL HYSTERECTOMY     ANKLE SURGERY Left    x4   CARPAL TUNNEL RELEASE Right 03/2014   CHOLECYSTECTOMY     ENDOBRONCHIAL ULTRASOUND N/A 01/07/2016   Procedure: ENDOBRONCHIAL ULTRASOUND;  Surgeon: Vilinda Boehringer, MD;  Location: [facility];  Service: Cardiopulmonary;  Laterality: N/A;   ENDOBRONCHIAL ULTRASOUND N/A 09/16/2016   Procedure: ENDOBRONCHIAL ULTRASOUND;  Surgeon: Vilinda Boehringer, MD;  Location: [facility];  Service: Cardiopulmonary;  Laterality: N/A;   ENDOBRONCHIAL ULTRASOUND N/A 07/02/2017   Procedure: ENDOBRONCHIAL ULTRASOUND;  Surgeon: Laverle Hobby, MD;  Location: [facility];  Service: Pulmonary;  Laterality: N/A;   HERNIA REPAIR Left    inguinal   PORT-A-CATH REMOVAL N/A  09/08/2017   Procedure: REMOVAL PORT-A-CATH;  Surgeon: [name], MD;  Location: [facility];  Service: General;  Laterality: N/A;   PORTACATH PLACEMENT Right 01/23/2016   Procedure: INSERTION PORT-A-CATH;  Surgeon: [name], MD;  Location: [facility];  Service: General;  Laterality: Right;   PORTACATH PLACEMENT N/A 03/01/2019   Procedure:  INSERTION PORT-A-CATH;  Surgeon: [name], MD;  Location: [facility];  Service: General;  Laterality: N/A;    FAMILY HISTORY Family History  Problem Relation Age of Onset   Emphysema Mother    Diabetes Father        ADVANCED DIRECTIVES:    HEALTH MAINTENANCE: Social History   Tobacco Use   Smoking status: Former Smoker    Packs/day: 1.00    Years: 30.00    Pack years: 30.00    Types: Cigarettes    Last attempt to quit: 01/11/2016    Years since quitting: 3.1   Smokeless tobacco: Never Used  Substance Use Topics   Alcohol use: No    Alcohol/week: 0.0 standard drinks   Drug use: No     Allergies  Allergen Reactions   Macrobid [Nitrofurantoin] Anaphylaxis   Effexor [Venlafaxine] Other (See Comments)    UNKNOWN   Hctz [Hydrochlorothiazide] Other (See Comments)    cramps   Neurontin [Gabapentin] Other (See Comments)    UNKNOWN    Paxil [Paroxetine Hcl] Other (See Comments)    UNKNOWN    Pravachol [Pravastatin Sodium] Other (See Comments)    aching   Prozac [Fluoxetine Hcl] Other (See Comments)    UNKNOWN    Sulfur Itching and Other (See Comments)    "blisters in mouth"   Wellbutrin [Bupropion] Itching   Zoloft [Sertraline Hcl] Other (See Comments)    UNKNOWN     Current Outpatient Medications  Medication Sig Dispense Refill   acetaminophen (TYLENOL) 500 MG tablet Take 1,000 mg by mouth daily as needed for mild pain.     albuterol (PROVENTIL HFA;VENTOLIN HFA) 108 (90 Base) MCG/ACT inhaler Inhale 2 puffs into the lungs every 6 (six) hours as needed for wheezing or shortness of breath. 1 Inhaler 2   amitriptyline (ELAVIL) 10 MG tablet TAKE 2 TABLETS BY MOUTH AT BEDTIME 60 tablet 3   aspirin EC 81 MG tablet Take 81 mg by mouth daily.     cyanocobalamin (,VITAMIN B-12,) 1000 MCG/ML injection INJECT 1 MILLILITER INTO THE MUSCLE ONCE 10 mL 1   furosemide (LASIX) 20 MG tablet Take 1 tablet (20 mg total) by mouth every other day. 30 tablet 11    ipratropium-albuterol (DUONEB) 0.5-2.5 (3) MG/3ML SOLN Take 3 mLs by nebulization every 6 (six) hours as needed. 360 mL 0   levothyroxine (SYNTHROID, LEVOTHROID) 112 MCG tablet TAKE 1 TABLET ON AN EMPTY STOMACH WITH A GLASS OF WATER AT LEAST 30 TO 60 MINUTES BEFORE BREAKFAST. 30 tablet 5   lidocaine-prilocaine (EMLA) cream Apply to affected area once 30 g 3   ondansetron (ZOFRAN) 8 MG tablet Take 1 tablet (8 mg total) by mouth 2 (two) times daily as needed for refractory nausea / vomiting. 30 tablet 2   potassium chloride (K-DUR) 10 MEQ tablet Take 1 tablet (10 mEq total) by mouth every other day. 30 tablet 11   prochlorperazine (COMPAZINE) 10 MG tablet Take 1 tablet (10 mg total) by mouth every 6 (six) hours as needed (Nausea or vomiting). 60 tablet 2   promethazine (PHENERGAN) 25 MG tablet Take 1 tablet (25 mg total) by mouth every 6 (six) hours  as needed for nausea or vomiting. 30 tablet 2   ranitidine (ZANTAC) 150 MG tablet Take 150 mg by mouth 2 (two) times daily as needed for heartburn.     simvastatin (ZOCOR) 10 MG tablet TAKE (1) TABLET BY MOUTH EVERY DAY AT 6:00 IN THE EVENING 30 tablet 12   zolpidem (AMBIEN) 10 MG tablet TAKE ONE TABLET BY MOUTH DAILY AT BEDTIME AS NEEDED FOR SLEEP. 30 tablet 5   ALPRAZolam (XANAX) 0.25 MG tablet Take 1 tablet (0.25 mg total) by mouth at bedtime as needed for anxiety. (Patient not taking: Reported on 03/01/2019) 30 tablet 0   amitriptyline (ELAVIL) 25 MG tablet Take 1-2 tablets at bedtime daily (Patient not taking: Reported on 03/02/2019) 60 tablet 5   levothyroxine (SYNTHROID, LEVOTHROID) 125 MCG tablet TAKE 1 TABLET ON AN EMPTY STOMACH WITH A GLASS OF WATER AT LEAST 30 TO 60 MINUTES BEFORE BREAKFAST. (Patient not taking: Reported on 03/02/2019) 90 tablet 0   No current facility-administered medications for this visit.    Facility-Administered Medications Ordered in Other Visits  Medication Dose Route Frequency Provider Last Rate Last Dose    CARBOplatin (PARAPLATIN) 510 mg in sodium chloride 0.9 % 250 mL chemo infusion  510 mg Intravenous Once Lloyd Huger, MD       etoposide (VEPESID) 140 mg in sodium chloride 0.9 % 500 mL chemo infusion  80 mg/m2 (Treatment Plan Recorded) Intravenous Once Lloyd Huger, MD 507 mL/hr at 03/02/19 1106 140 mg at 03/02/19 1106   heparin lock flush 100 unit/mL  500 Units Intracatheter Once PRN Lloyd Huger, MD       sodium chloride flush (NS) 0.9 % injection 10 mL  10 mL Intracatheter PRN Lloyd Huger, MD   10 mL at 03/02/19 1029    OBJECTIVE: Vitals:   03/02/19 0918  BP: (!) 115/56  Pulse: 93  Temp: (!) 95.9 F (35.5 C)     Body mass index is 25.47 kg/m.    ECOG FS:1 - Symptomatic but completely ambulatory  General: Well-developed, well-nourished, no acute distress.  Sitting in a wheelchair. Eyes: Pink conjunctiva, anicteric sclera. HEENT: Normocephalic, moist mucous membranes, clear oropharnyx. Lungs: Decreased breath sounds on left. Heart: Regular rate and rhythm. No rubs, murmurs, or gallops. Abdomen: Soft, nontender, nondistended. No organomegaly noted, normoactive bowel sounds. Musculoskeletal: No edema, cyanosis, or clubbing. Neuro: Alert, answering all questions appropriately. Cranial nerves grossly intact. Skin: No rashes or petechiae noted. Psych: Normal affect.  LAB RESULTS:  Lab Results  Component Value Date   NA 133 (L) 03/02/2019   K 3.6 03/02/2019   CL 99 03/02/2019   CO2 25 03/02/2019   GLUCOSE 165 (H) 03/02/2019   BUN 9 03/02/2019   CREATININE 0.76 03/02/2019   CALCIUM 9.0 03/02/2019   PROT 7.5 03/02/2019   ALBUMIN 3.3 (L) 03/02/2019   AST 27 03/02/2019   ALT 14 03/02/2019   ALKPHOS 90 03/02/2019   BILITOT 0.2 (L) 03/02/2019   GFRNONAA >60 03/02/2019   GFRAA >60 03/02/2019    Lab Results  Component Value Date   WBC 10.9 (H) 03/02/2019   NEUTROABS 9.4 (H) 03/02/2019   HGB 8.9 (L) 03/02/2019   HCT 28.2 (L) 03/02/2019   MCV  89.2 03/02/2019   PLT 238 03/02/2019     STUDIES: Ct Biopsy  Result Date: 03/07/2019 INDICATION: History of lung cancer, now with indeterminate hypermetabolic lesion involving the anterior aspect the right ilium. Please perform CT-guided biopsy for tissue diagnostic purposes. EXAM: CT-GUIDED  BONE LESION BIOPSY MEDICATIONS: None ANESTHESIA/SEDATION: Fentanyl 75 mcg IV; Versed 2.5 mg IV Sedation Time: 19 Minutes; The patient was continuously monitored during the procedure by the interventional radiology nurse under my direct supervision. COMPLICATIONS: None immediate. PROCEDURE: Informed consent was obtained from the patient following an explanation of the procedure, risks, benefits and alternatives. The patient understands, agrees and consents for the procedure. All questions were addressed. A time out was performed prior to the initiation of the procedure. The patient was positioned supine and non-contrast localization CT was performed of the pelvis demonstrating unchanged size and appearance the approximately 1.7 x 1.1 cm lytic lesion involving the anterior aspect of the right ilium (image 27, series 2). The operative site was prepped and draped in the usual sterile fashion. Under sterile conditions and local anesthesia, a 22 gauge spinal needle was utilized for procedural planning. Next, an 11 gauge coaxial bone biopsy needle was advanced into the peripheral aspect of the ill-defined lytic lesion involving the anterior aspect the right ilium. Appropriate position was confirmed and 3 biopsies were obtained with the inner 13 gauge device of the bone biopsy set (representative image 37, series 3). Appropriate positioning was confirmed and a final core needle biopsy was obtained with the outer 11 gauge coaxial bone biopsy device (representative image 46, series 3). The needle was removed intact. Superficial hemostasis was obtained with compression and a dressing was placed. The patient tolerated the procedure  well without immediate post procedural complication. IMPRESSION: Successful CT guided biopsy of lytic lesion involving the anterior aspect the right ilium. Electronically Signed   By: Sandi Mariscal M.D.   On: 02/10/2019 11:50   Dg Chest Port 1 View  Result Date: 03/01/2019 CLINICAL DATA:  Status post port placement EXAM: PORTABLE CHEST 1 VIEW COMPARISON:  01/26/2019 FINDINGS: Cardiac shadow is stable. Left chest wall port is noted with the catheter tip at the cavoatrial junction. No pneumothorax is seen. Opacification of left hemithorax is noted stable from the prior PET-CT. No acute bony abnormality is noted. IMPRESSION: No pneumothorax following chest wall port placement. Stable left hemithorax opacification Electronically Signed   By: Inez Catalina M.D.   On: 03/01/2019 09:23   Dg C-arm 1-60 Min-no Report  Result Date: 03/01/2019 Fluoroscopy was utilized by the requesting physician.  No radiographic interpretation.    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  PLAN:    1. 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  PET scan results from January 26, 2019 reviewed independently with recurrent disease in her chest and a isolated metastatic lesion in the right ilium.  CT-guided biopsy of the iliac lesion completed on February 10, 2019 confirmed metastatic disease.  After lengthy discussion with the patient, she wishes to pursue aggressive chemotherapy and will receive carboplatinum and etoposide on day 1 with etoposide on days 2 and 3.  She may require Neulasta in the future if necessary.  Patient has had her port replaced.  MRI of the brain has not yet been completed.  Proceed with cycle 1, day 1 of carboplatinum and etoposide today.  Return to clinic in 1 and 2 days for etoposide only.  She will return to clinic in 1 week  for laboratory work and further evaluation and then again in 3 weeks for laboratory work, further evaluation, consideration of cycle 2, day 1.  2.  History of pulmonary embolism: Likely secondary to increased sedentary lifestyle.  Patient completed 6 months of Eliquis.   3. Peripheral neuropathy: Patient does not complain of this today.  Patient is no longer taking gabapentin. 4. Anemia: Patient's hemoglobin has trended down to 8.9, monitor.   5. Thyroid: Continue current dose of 125 g Synthroid. Will defer to primary care for adjustment of medications.  6. Pain: Patient does not complain of pain today.  Continue Percocet as needed. 7. Anxiety: Chronic and unchanged.  Continue Xanax as needed. 8.  Nausea: Patient does not complain of this today.  Continue Phenergan as needed. 9.  Shortness of breath: Chronic and unchanged.  Continue oxygen as prescribed.  Patient expressed understanding and was in agreement with this plan. She also understands that She can call clinic at any time with any questions, concerns, or complaints.    Lloyd Huger, MD   03/02/2019 11:28 AM

## 2019-03-02 NOTE — Progress Notes (Signed)
Patient does not offer any problems today.  

## 2019-03-03 ENCOUNTER — Inpatient Hospital Stay: Payer: Managed Care, Other (non HMO)

## 2019-03-03 ENCOUNTER — Other Ambulatory Visit: Payer: Self-pay

## 2019-03-03 DIAGNOSIS — C3432 Malignant neoplasm of lower lobe, left bronchus or lung: Secondary | ICD-10-CM

## 2019-03-03 DIAGNOSIS — C7951 Secondary malignant neoplasm of bone: Secondary | ICD-10-CM

## 2019-03-03 MED ORDER — DEXAMETHASONE SODIUM PHOSPHATE 10 MG/ML IJ SOLN
10.0000 mg | Freq: Once | INTRAMUSCULAR | Status: AC
  Administered 2019-03-03: 10 mg via INTRAVENOUS
  Filled 2019-03-03: qty 1

## 2019-03-03 MED ORDER — SODIUM CHLORIDE 0.9 % IV SOLN
80.0000 mg/m2 | Freq: Once | INTRAVENOUS | Status: AC
  Administered 2019-03-03: 140 mg via INTRAVENOUS
  Filled 2019-03-03: qty 7

## 2019-03-03 MED ORDER — HEPARIN SOD (PORK) LOCK FLUSH 100 UNIT/ML IV SOLN
500.0000 [IU] | Freq: Once | INTRAVENOUS | Status: AC | PRN
  Administered 2019-03-03: 500 [IU]
  Filled 2019-03-03: qty 5

## 2019-03-03 MED ORDER — SODIUM CHLORIDE 0.9 % IV SOLN
Freq: Once | INTRAVENOUS | Status: AC
  Administered 2019-03-03: 14:00:00 via INTRAVENOUS
  Filled 2019-03-03: qty 250

## 2019-03-04 ENCOUNTER — Inpatient Hospital Stay: Payer: Managed Care, Other (non HMO)

## 2019-03-04 ENCOUNTER — Ambulatory Visit
Admission: RE | Admit: 2019-03-04 | Discharge: 2019-03-04 | Disposition: A | Discharge status: Home / Self Care | Payer: Managed Care, Other (non HMO) | Source: Ambulatory Visit | Attending: Oncology | Admitting: Oncology

## 2019-03-04 ENCOUNTER — Ambulatory Visit: Payer: Managed Care, Other (non HMO)

## 2019-03-04 ENCOUNTER — Other Ambulatory Visit: Payer: Self-pay

## 2019-03-04 VITALS — BP 138/76 | HR 91 | Temp 97.6°F | Resp 18

## 2019-03-04 DIAGNOSIS — C7951 Secondary malignant neoplasm of bone: Secondary | ICD-10-CM | POA: Insufficient documentation

## 2019-03-04 DIAGNOSIS — C3432 Malignant neoplasm of lower lobe, left bronchus or lung: Secondary | ICD-10-CM | POA: Diagnosis not present

## 2019-03-04 MED ORDER — HEPARIN SOD (PORK) LOCK FLUSH 100 UNIT/ML IV SOLN
500.0000 [IU] | Freq: Once | INTRAVENOUS | Status: AC | PRN
  Administered 2019-03-04: 500 [IU]
  Filled 2019-03-04: qty 5

## 2019-03-04 MED ORDER — SODIUM CHLORIDE 0.9 % IV SOLN
80.0000 mg/m2 | Freq: Once | INTRAVENOUS | Status: AC
  Administered 2019-03-04: 140 mg via INTRAVENOUS
  Filled 2019-03-04: qty 7

## 2019-03-04 MED ORDER — SODIUM CHLORIDE 0.9 % IV SOLN
Freq: Once | INTRAVENOUS | Status: AC
  Administered 2019-03-04: 14:00:00 via INTRAVENOUS
  Filled 2019-03-04: qty 250

## 2019-03-04 MED ORDER — SODIUM CHLORIDE 0.9 % IV SOLN: 10.0000 mg | Freq: Once | INTRAVENOUS | Status: DC

## 2019-03-04 MED ORDER — GADOBUTROL 1 MMOL/ML IV SOLN
6.0000 mL | Freq: Once | INTRAVENOUS | Status: AC | PRN
  Administered 2019-03-04: 6 mL via INTRAVENOUS

## 2019-03-04 MED ORDER — SODIUM CHLORIDE 0.9% FLUSH
10.0000 mL | INTRAVENOUS | Status: DC | PRN
  Administered 2019-03-04: 10 mL
  Filled 2019-03-04: qty 10

## 2019-03-04 MED ORDER — DEXAMETHASONE SODIUM PHOSPHATE 10 MG/ML IJ SOLN
10.0000 mg | Freq: Once | INTRAMUSCULAR | Status: AC
  Administered 2019-03-04: 10 mg via INTRAVENOUS
  Filled 2019-03-04: qty 1

## 2019-03-05 ENCOUNTER — Other Ambulatory Visit: Payer: Self-pay | Admitting: Family Medicine

## 2019-03-07 ENCOUNTER — Other Ambulatory Visit: Payer: Self-pay

## 2019-03-08 ENCOUNTER — Other Ambulatory Visit: Payer: Self-pay

## 2019-03-08 ENCOUNTER — Encounter: Payer: Self-pay | Admitting: Radiation Oncology

## 2019-03-08 ENCOUNTER — Inpatient Hospital Stay (HOSPITAL_BASED_OUTPATIENT_CLINIC_OR_DEPARTMENT_OTHER): Payer: Managed Care, Other (non HMO) | Admitting: Oncology

## 2019-03-08 ENCOUNTER — Encounter: Payer: Self-pay | Admitting: Oncology

## 2019-03-08 ENCOUNTER — Other Ambulatory Visit: Payer: Self-pay | Admitting: *Deleted

## 2019-03-08 ENCOUNTER — Ambulatory Visit
Admission: RE | Admit: 2019-03-08 | Discharge: 2019-03-08 | Disposition: A | Discharge status: Home / Self Care | Payer: Managed Care, Other (non HMO) | Source: Ambulatory Visit | Attending: Radiation Oncology | Admitting: Radiation Oncology

## 2019-03-08 ENCOUNTER — Inpatient Hospital Stay: Payer: Managed Care, Other (non HMO)

## 2019-03-08 VITALS — BP 106/58 | HR 104 | Temp 96.3°F | Resp 18

## 2019-03-08 VITALS — BP 106/58 | HR 104 | Temp 96.3°F | Resp 18 | Wt 141.9 lb

## 2019-03-08 DIAGNOSIS — Z87891 Personal history of nicotine dependence: Secondary | ICD-10-CM | POA: Diagnosis not present

## 2019-03-08 DIAGNOSIS — Z7982 Long term (current) use of aspirin: Secondary | ICD-10-CM | POA: Diagnosis not present

## 2019-03-08 DIAGNOSIS — Z8701 Personal history of pneumonia (recurrent): Secondary | ICD-10-CM

## 2019-03-08 DIAGNOSIS — C3432 Malignant neoplasm of lower lobe, left bronchus or lung: Secondary | ICD-10-CM | POA: Diagnosis not present

## 2019-03-08 DIAGNOSIS — M8580 Other specified disorders of bone density and structure, unspecified site: Secondary | ICD-10-CM | POA: Diagnosis not present

## 2019-03-08 DIAGNOSIS — Z923 Personal history of irradiation: Secondary | ICD-10-CM

## 2019-03-08 DIAGNOSIS — C7931 Secondary malignant neoplasm of brain: Secondary | ICD-10-CM | POA: Insufficient documentation

## 2019-03-08 DIAGNOSIS — Z79899 Other long term (current) drug therapy: Secondary | ICD-10-CM | POA: Diagnosis not present

## 2019-03-08 DIAGNOSIS — J449 Chronic obstructive pulmonary disease, unspecified: Secondary | ICD-10-CM | POA: Insufficient documentation

## 2019-03-08 DIAGNOSIS — R531 Weakness: Secondary | ICD-10-CM | POA: Insufficient documentation

## 2019-03-08 DIAGNOSIS — M545 Low back pain: Secondary | ICD-10-CM | POA: Insufficient documentation

## 2019-03-08 DIAGNOSIS — D538 Other specified nutritional anemias: Secondary | ICD-10-CM | POA: Diagnosis not present

## 2019-03-08 DIAGNOSIS — Z86711 Personal history of pulmonary embolism: Secondary | ICD-10-CM

## 2019-03-08 DIAGNOSIS — Z9221 Personal history of antineoplastic chemotherapy: Secondary | ICD-10-CM

## 2019-03-08 DIAGNOSIS — C7951 Secondary malignant neoplasm of bone: Secondary | ICD-10-CM

## 2019-03-08 DIAGNOSIS — E039 Hypothyroidism, unspecified: Secondary | ICD-10-CM | POA: Insufficient documentation

## 2019-03-08 DIAGNOSIS — G4700 Insomnia, unspecified: Secondary | ICD-10-CM

## 2019-03-08 DIAGNOSIS — E785 Hyperlipidemia, unspecified: Secondary | ICD-10-CM

## 2019-03-08 DIAGNOSIS — G629 Polyneuropathy, unspecified: Secondary | ICD-10-CM

## 2019-03-08 DIAGNOSIS — D649 Anemia, unspecified: Secondary | ICD-10-CM

## 2019-03-08 DIAGNOSIS — E063 Autoimmune thyroiditis: Secondary | ICD-10-CM

## 2019-03-08 DIAGNOSIS — I313 Pericardial effusion (noninflammatory): Secondary | ICD-10-CM

## 2019-03-08 DIAGNOSIS — F329 Major depressive disorder, single episode, unspecified: Secondary | ICD-10-CM | POA: Insufficient documentation

## 2019-03-08 DIAGNOSIS — Z5111 Encounter for antineoplastic chemotherapy: Secondary | ICD-10-CM

## 2019-03-08 DIAGNOSIS — F419 Anxiety disorder, unspecified: Secondary | ICD-10-CM

## 2019-03-08 DIAGNOSIS — K219 Gastro-esophageal reflux disease without esophagitis: Secondary | ICD-10-CM | POA: Insufficient documentation

## 2019-03-08 DIAGNOSIS — R5383 Other fatigue: Secondary | ICD-10-CM | POA: Diagnosis not present

## 2019-03-08 DIAGNOSIS — M549 Dorsalgia, unspecified: Secondary | ICD-10-CM | POA: Diagnosis not present

## 2019-03-08 LAB — CBC WITH DIFFERENTIAL/PLATELET
Abs Immature Granulocytes: 0 10*3/uL (ref 0.00–0.07)
Basophils Absolute: 0 10*3/uL (ref 0.0–0.1)
Basophils Relative: 0 %
Eosinophils Absolute: 0 10*3/uL (ref 0.0–0.5)
Eosinophils Relative: 0 %
HCT: 30.4 % — ABNORMAL LOW (ref 36.0–46.0)
Hemoglobin: 9.6 g/dL — ABNORMAL LOW (ref 12.0–15.0)
LYMPHS PCT: 19 %
Lymphs Abs: 1.4 10*3/uL (ref 0.7–4.0)
MCH: 28 pg (ref 26.0–34.0)
MCHC: 31.6 g/dL (ref 30.0–36.0)
MCV: 88.6 fL (ref 80.0–100.0)
Monocytes Absolute: 0 10*3/uL — ABNORMAL LOW (ref 0.1–1.0)
Monocytes Relative: 0 %
Neutro Abs: 5.8 10*3/uL (ref 1.7–7.7)
Neutrophils Relative %: 81 %
Platelets: 209 10*3/uL (ref 150–400)
RBC: 3.43 MIL/uL — AB (ref 3.87–5.11)
RDW: 14.6 % (ref 11.5–15.5)
SMEAR REVIEW: ADEQUATE
WBC: 7.2 10*3/uL (ref 4.0–10.5)
nRBC: 0 % (ref 0.0–0.2)

## 2019-03-08 LAB — COMPREHENSIVE METABOLIC PANEL
ALT: 26 U/L (ref 0–44)
AST: 21 U/L (ref 15–41)
Albumin: 3.3 g/dL — ABNORMAL LOW (ref 3.5–5.0)
Alkaline Phosphatase: 88 U/L (ref 38–126)
Anion gap: 8 (ref 5–15)
BUN: 18 mg/dL (ref 8–23)
CO2: 26 mmol/L (ref 22–32)
Calcium: 8.6 mg/dL — ABNORMAL LOW (ref 8.9–10.3)
Chloride: 97 mmol/L — ABNORMAL LOW (ref 98–111)
Creatinine, Ser: 0.74 mg/dL (ref 0.44–1.00)
GFR calc Af Amer: >60 mL/min (ref >60)
GFR calc non Af Amer: >60 mL/min (ref >60)
Glucose, Bld: 99 mg/dL (ref 70–99)
Potassium: 3.6 mmol/L (ref 3.5–5.1)
Sodium: 131 mmol/L — ABNORMAL LOW (ref 135–145)
Total Bilirubin: 0.4 mg/dL (ref 0.3–1.2)
Total Protein: 7.1 g/dL (ref 6.5–8.1)

## 2019-03-08 NOTE — Consult Note (Signed)
NEW PATIENT EVALUATION  Name: Jill Gross  MRN: [id number]  Date:   03/08/2019     DOB: 03-30-1956   This 63 y.o. 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  REFERRING PHYSICIAN: Volney American,*  CHIEF COMPLAINT:  Chief Complaint  Patient presents with  . Cancer    OPNA for brain metastasis    DIAGNOSIS: The encounter diagnosis was Brain metastasis ([place]).   PREVIOUS INVESTIGATIONS:  PET CT scan and MRI of brain reviewed Clinical notes reviewed  HPI: Patient is a 63 year old female now out over 3 years having completed concurrent chemoradiation therapy for limited stage small cell lung cancer.  Patient also received prophylactic cranial irradiation at that time.  She has on recent PET CT scan left mainstem obstruction by recurrent tumor with hypermetabolic activity in the left lingula.  She has been started on carboplatinum etoposide.  She is on chronic oxygen therapy.  Also as part of her work-up had an MRI scan of her brain showing interval development of 4 small enhancing metastatic deposits in the brain.  She is really having no focal neurologic deficits.  She does have difficulty ambulating for over a year now secondary to leg weakness.  She specifically denies headaches or change in visual fields.  Seen today for consideration of palliative radiation therapy to her whole brain.  PLANNED TREATMENT REGIMEN: Whole brain radiation  PAST MEDICAL HISTORY:  has a past medical history of Allergy, Anemia, Anxiety, Arthritis, Atrophic vaginitis, Back pain, COPD (chronic obstructive pulmonary disease) ([place]), Depression, Diverticulosis, Dyspnea, Family history of adverse reaction to anesthesia, Fatigue, GERD (gastroesophageal reflux disease), H/O degenerative disc disease, Hashimoto's disease, History of chemotherapy (04/2016), Hyperlipidemia, Hypothyroid, Insomnia, Low  back pain, Lumbago, Neuropathy due to chemotherapeutic drug ([place]), Osteopenia, Pneumonia (11/2016), Small cell carcinoma of left lung ([place]) (01/2016), Tobacco use, and Vitamin B12 deficiency.    PAST SURGICAL HISTORY:  Past Surgical History:  Procedure Laterality Date  . ABDOMINAL HYSTERECTOMY    . ANKLE SURGERY Left    x4  . CARPAL TUNNEL RELEASE Right 03/2014  . CHOLECYSTECTOMY    . ENDOBRONCHIAL ULTRASOUND N/A 01/07/2016   Procedure: ENDOBRONCHIAL ULTRASOUND;  Surgeon: Vilinda Boehringer, MD;  Location: [facility];  Service: Cardiopulmonary;  Laterality: N/A;  . ENDOBRONCHIAL ULTRASOUND N/A 09/16/2016   Procedure: ENDOBRONCHIAL ULTRASOUND;  Surgeon: Vilinda Boehringer, MD;  Location: [facility];  Service: Cardiopulmonary;  Laterality: N/A;  . ENDOBRONCHIAL ULTRASOUND N/A 07/02/2017   Procedure: ENDOBRONCHIAL ULTRASOUND;  Surgeon: Laverle Hobby, MD;  Location: [facility];  Service: Pulmonary;  Laterality: N/A;  . HERNIA REPAIR Left    inguinal  . PORT-A-CATH REMOVAL N/A 09/08/2017   Procedure: REMOVAL PORT-A-CATH;  Surgeon: [name], MD;  Location: [facility];  Service: General;  Laterality: N/A;  . PORTACATH PLACEMENT Right 01/23/2016   Procedure: INSERTION PORT-A-CATH;  Surgeon: [name], MD;  Location: [facility];  Service: General;  Laterality: Right;  . PORTACATH PLACEMENT N/A 03/01/2019   Procedure: INSERTION PORT-A-CATH;  Surgeon: [name], MD;  Location: [facility];  Service: General;  Laterality: N/A;    FAMILY HISTORY: family history includes Diabetes in her father; Emphysema in her mother.  SOCIAL HISTORY:  reports that she quit smoking about 3 years ago. Her smoking use included cigarettes. She has a 30.00 pack-year smoking history. She has never used smokeless tobacco. She reports that she does not drink alcohol or use drugs.  ALLERGIES:  Macrobid [nitrofurantoin]; Effexor [venlafaxine]; Hctz [hydrochlorothiazide]; Neurontin [gabapentin]; Paxil [paroxetine hcl]; Pravachol  [pravastatin sodium]; Prozac [fluoxetine hcl]; Sulfur; Wellbutrin [bupropion]; and Zoloft [sertraline hcl]  MEDICATIONS:  Current Outpatient Medications  Medication Sig Dispense Refill  . acetaminophen (TYLENOL) 500 MG tablet Take 1,000 mg by mouth daily as needed for mild pain.    Marland Kitchen albuterol (PROVENTIL HFA;VENTOLIN HFA) 108 (90 Base) MCG/ACT inhaler Inhale 2 puffs into the lungs every 6 (six) hours as needed for wheezing or shortness of breath. 1 Inhaler 2  . amitriptyline (ELAVIL) 25 MG tablet TAKE 1-2 TABLETS BY MOUTH AT BEDTIME. 60 tablet 5  . aspirin EC 81 MG tablet Take 81 mg by mouth daily.    . cyanocobalamin (,VITAMIN B-12,) 1000 MCG/ML injection INJECT 1 MILLILITER INTO THE MUSCLE ONCE 10 mL 1  . furosemide (LASIX) 20 MG tablet Take 1 tablet (20 mg total) by mouth every other day. (Patient not taking: Reported on 03/08/2019) 30 tablet 11  . ipratropium-albuterol (DUONEB) 0.5-2.5 (3) MG/3ML SOLN Take 3 mLs by nebulization every 6 (six) hours as needed. 360 mL 0  . levothyroxine (SYNTHROID, LEVOTHROID) 112 MCG tablet TAKE 1 TABLET ON AN EMPTY STOMACH WITH A GLASS OF WATER AT LEAST 30 TO 60 MINUTES BEFORE BREAKFAST. 30 tablet 5  . lidocaine-prilocaine (EMLA) cream Apply to affected area once 30 g 3  . ondansetron (ZOFRAN) 8 MG tablet Take 1 tablet (8 mg total) by mouth 2 (two) times daily as needed for refractory nausea / vomiting. 30 tablet 2  . potassium chloride (K-DUR) 10 MEQ tablet Take 1 tablet (10 mEq total) by mouth every other day. 30 tablet 11  . prochlorperazine (COMPAZINE) 10 MG tablet Take 1 tablet (10 mg total) by mouth every 6 (six) hours as needed (Nausea or vomiting). 60 tablet 2  . promethazine (PHENERGAN) 25 MG tablet Take 1 tablet (25 mg total) by mouth every 6 (six) hours as needed for nausea or vomiting. 30 tablet 2  . ranitidine (ZANTAC) 150 MG tablet Take 150 mg by mouth 2 (two) times daily as needed for heartburn.    . simvastatin (ZOCOR) 10 MG tablet TAKE (1)  TABLET BY MOUTH EVERY DAY AT 6:00 IN THE EVENING 30 tablet 12  . zolpidem (AMBIEN) 10 MG tablet TAKE ONE TABLET BY MOUTH DAILY AT BEDTIME AS NEEDED FOR SLEEP. 30 tablet 5  . ALPRAZolam (XANAX) 0.25 MG tablet Take 1 tablet (0.25 mg total) by mouth at bedtime as needed for anxiety. 30 tablet 0  . omeprazole (PRILOSEC) 20 MG capsule Take 20 mg by mouth daily.     No current facility-administered medications for this encounter.     ECOG PERFORMANCE STATUS:  0 - Asymptomatic  REVIEW OF SYSTEMS: Except for the weakness in the lower extremities cough and oxygen dependent. Patient denies any weight loss, fatigue, weakness, fever, chills or night sweats. Patient denies any loss of vision, blurred vision. Patient denies any ringing  of the ears or hearing loss. No irregular heartbeat. Patient denies heart murmur or history of fainting. Patient denies any chest pain or pain radiating to her upper extremities. Patient denies any shortness of breath, difficulty breathing at night, cough or hemoptysis. Patient denies any swelling in the lower legs. Patient denies any nausea vomiting, vomiting of blood, or coffee ground material in the vomitus. Patient denies any stomach pain. Patient states has had normal bowel movements no significant constipation or diarrhea. Patient denies any dysuria, hematuria or significant nocturia. Patient denies any problems walking, swelling  in the joints or loss of balance. Patient denies any skin changes, loss of hair or loss of weight. Patient denies any excessive worrying or anxiety or significant depression. Patient denies any problems with insomnia. Patient denies excessive thirst, polyuria, polydipsia. Patient denies any swollen glands, patient denies easy bruising or easy bleeding. Patient denies any recent infections, allergies or URI. Patient "s visual fields have not changed significantly in recent time.   PHYSICAL EXAM: BP (!) 106/58 (BP Location: Left Arm, Patient Position:  Sitting)   Pulse (!) 104   Temp (!) 96.3 F (35.7 C) (Tympanic)   Resp 18   LMP  (LMP Unknown)  Wheelchair-bound female on continuous nasal oxygen.  No change in visual fields motor or sensory and DTR levels are equal and symmetric in the upper lower extremities.  Well-developed well-nourished patient in NAD. HEENT reveals PERLA, EOMI, discs not visualized.  Oral cavity is clear. No oral mucosal lesions are identified. Neck is clear without evidence of cervical or supraclavicular adenopathy. Lungs are clear to A&P. Cardiac examination is essentially unremarkable with regular rate and rhythm without murmur rub or thrill. Abdomen is benign with no organomegaly or masses noted. Motor sensory and DTR levels are equal and symmetric in the upper and lower extremities. Cranial nerves II through XII are grossly intact. Proprioception is intact. No peripheral adenopathy or edema is identified. No motor or sensory levels are noted. Crude visual fields are within normal range.  LABORATORY DATA: Previous pathology report reviewed    RADIOLOGY RESULTS: Brain MRI and PET CT scans reviewed   IMPRESSION: Recurrent small cell lung cancer 3 years after concurrent chemoradiation therapy in 63 year old female with recurrent lung and brain disease.  PLAN: At this time elected ahead with palliative radiation therapy to her whole brain.  We will plan on delivering 3000 cGy in 12 fractions.  Risks and benefits of treatment occluding hair loss fatigue possible cognitive decline skin reaction all were described in detail to the patient and her husband.  After brain radiation therapy will talk to Dr. [name] about possible palliative radiation therapy in a hypofractionated course of treatment to her left mainstem and area of hypermetabolic activity on PET CT scan to try to open up the left lung.  I have personally set up and ordered CT simulation for tomorrow.  There will be extra effort by both professional staff as well as  technical staff to coordinate and manage concurrent chemoradiation and ensuing side effects during his treatments.  Patient seems to comprehend my treatment plan well.  I would like to take this opportunity to thank you for allowing me to participate in the care of your patient.Noreene Filbert, MD

## 2019-03-08 NOTE — Progress Notes (Signed)
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  INTERVAL HISTORY: Patient returns to clinic today for further evaluation, assess her toleration of cycle 1 of carboplatinum etoposide as well as to discuss her brain MRI.  She tolerated her first treatment well without significant side effects.  She is anxious, but otherwise feels well.  She continues to have chronic weakness and fatigue.  She continues to have chronic shortness of breath and requires oxygen 24 hours/day. She does not complain of increased cough, hemoptysis, or chest pain.  She has no neurologic complaints.  She denies any recent fevers or illnesses.  She has a fair appetite and denies weight loss.  She denies any nausea, vomiting, constipation, or diarrhea. She has no urinary complaints.  Patient offers no further specific complaints today.  REVIEW OF SYSTEMS:   Review of Systems  Constitutional: Positive for malaise/fatigue. Negative for fever and weight loss.  HENT: Negative for sore throat.   Eyes: Negative.   Respiratory: Positive for shortness of breath. Negative for cough and hemoptysis.   Cardiovascular: Negative.  Negative for chest pain and leg swelling.  Gastrointestinal: Negative for nausea and vomiting.  Genitourinary: Negative.  Negative for dysuria.  Musculoskeletal: Negative.  Negative for back pain.  Skin: Negative.   Neurological: Positive for weakness. Negative for dizziness, tingling, sensory change and focal weakness.  Psychiatric/Behavioral:  Negative for depression. The patient is nervous/anxious.     As per HPI. Otherwise, a complete review of systems is negative.  PAST MEDICAL HISTORY: Past Medical History:  Diagnosis Date   Allergy    Anemia    pernicious   Anxiety    Arthritis    Atrophic vaginitis    Back pain    COPD (chronic obstructive pulmonary disease) (HCC)    Depression    Diverticulosis    Dyspnea    Family history of adverse reaction to anesthesia    MOM-BUT UNSURE WHAT REACTION WAS   Fatigue    GERD (gastroesophageal reflux disease)    H/O degenerative disc disease    Hashimoto's disease    History of chemotherapy 04/2016   Hyperlipidemia    Hypothyroid    Insomnia    Low back pain    Lumbago    Neuropathy due to chemotherapeutic drug ([place])    Osteopenia    Pneumonia 11/2016   Small cell carcinoma of left lung ([place]) 01/2016   rad + chemo tx's   Tobacco use    Vitamin B12 deficiency     PAST SURGICAL HISTORY: Past Surgical History:  Procedure Laterality Date   ABDOMINAL HYSTERECTOMY     ANKLE SURGERY Left    x4   CARPAL TUNNEL RELEASE Right 03/2014   CHOLECYSTECTOMY     ENDOBRONCHIAL ULTRASOUND N/A 01/07/2016   Procedure: ENDOBRONCHIAL ULTRASOUND;  Surgeon: Vilinda Boehringer, MD;  Location: [facility];  Service: Cardiopulmonary;  Laterality: N/A;   ENDOBRONCHIAL ULTRASOUND N/A 09/16/2016   Procedure: ENDOBRONCHIAL ULTRASOUND;  Surgeon: Vilinda Boehringer, MD;  Location: [facility];  Service: Cardiopulmonary;  Laterality: N/A;   ENDOBRONCHIAL ULTRASOUND N/A 07/02/2017   Procedure: ENDOBRONCHIAL ULTRASOUND;  Surgeon: Ashby Dawes,  Caffie Damme, MD;  Location: [facility];  Service: Pulmonary;  Laterality: N/A;   HERNIA REPAIR Left    inguinal   PORT-A-CATH REMOVAL N/A 09/08/2017   Procedure: REMOVAL PORT-A-CATH;  Surgeon: [name], MD;  Location: [facility];  Service: General;  Laterality: N/A;   PORTACATH PLACEMENT Right 01/23/2016   Procedure: INSERTION PORT-A-CATH;   Surgeon: [name], MD;  Location: [facility];  Service: General;  Laterality: Right;   PORTACATH PLACEMENT N/A 03/01/2019   Procedure: INSERTION PORT-A-CATH;  Surgeon: [name], MD;  Location: [facility];  Service: General;  Laterality: N/A;    FAMILY HISTORY Family History  Problem Relation Age of Onset   Emphysema Mother    Diabetes Father        ADVANCED DIRECTIVES:    HEALTH MAINTENANCE: Social History   Tobacco Use   Smoking status: Former Smoker    Packs/day: 1.00    Years: 30.00    Pack years: 30.00    Types: Cigarettes    Last attempt to quit: 01/11/2016    Years since quitting: 3.1   Smokeless tobacco: Never Used  Substance Use Topics   Alcohol use: No    Alcohol/week: 0.0 standard drinks   Drug use: No     Allergies  Allergen Reactions   Macrobid [Nitrofurantoin] Anaphylaxis   Effexor [Venlafaxine] Other (See Comments)    UNKNOWN   Hctz [Hydrochlorothiazide] Other (See Comments)    cramps   Neurontin [Gabapentin] Other (See Comments)    UNKNOWN    Paxil [Paroxetine Hcl] Other (See Comments)    UNKNOWN    Pravachol [Pravastatin Sodium] Other (See Comments)    aching   Prozac [Fluoxetine Hcl] Other (See Comments)    UNKNOWN    Sulfur Itching and Other (See Comments)    "blisters in mouth"   Wellbutrin [Bupropion] Itching   Zoloft [Sertraline Hcl] Other (See Comments)    UNKNOWN     Current Outpatient Medications  Medication Sig Dispense Refill   acetaminophen (TYLENOL) 500 MG tablet Take 1,000 mg by mouth daily as needed for mild pain.     albuterol (PROVENTIL HFA;VENTOLIN HFA) 108 (90 Base) MCG/ACT inhaler Inhale 2 puffs into the lungs every 6 (six) hours as needed for wheezing or shortness of breath. 1 Inhaler 2   ALPRAZolam (XANAX) 0.25 MG tablet Take 1 tablet (0.25 mg total) by mouth at bedtime as needed for anxiety. 30 tablet 0   amitriptyline (ELAVIL) 25 MG tablet TAKE 1-2 TABLETS BY MOUTH AT BEDTIME. 60 tablet 5    aspirin EC 81 MG tablet Take 81 mg by mouth daily.     cyanocobalamin (,VITAMIN B-12,) 1000 MCG/ML injection INJECT 1 MILLILITER INTO THE MUSCLE ONCE 10 mL 1   ipratropium-albuterol (DUONEB) 0.5-2.5 (3) MG/3ML SOLN Take 3 mLs by nebulization every 6 (six) hours as needed. 360 mL 0   levothyroxine (SYNTHROID, LEVOTHROID) 112 MCG tablet TAKE 1 TABLET ON AN EMPTY STOMACH WITH A GLASS OF WATER AT LEAST 30 TO 60 MINUTES BEFORE BREAKFAST. 30 tablet 5   lidocaine-prilocaine (EMLA) cream Apply to affected area once 30 g 3   omeprazole (PRILOSEC) 20 MG capsule Take 20 mg by mouth daily.     ondansetron (ZOFRAN) 8 MG tablet Take 1 tablet (8 mg total) by mouth 2 (two) times daily as needed for refractory nausea / vomiting. 30 tablet 2   potassium chloride (K-DUR) 10 MEQ tablet Take 1 tablet (10 mEq total) by mouth every other day. 30 tablet 11  prochlorperazine (COMPAZINE) 10 MG tablet Take 1 tablet (10 mg total) by mouth every 6 (six) hours as needed (Nausea or vomiting). 60 tablet 2   promethazine (PHENERGAN) 25 MG tablet Take 1 tablet (25 mg total) by mouth every 6 (six) hours as needed for nausea or vomiting. 30 tablet 2   simvastatin (ZOCOR) 10 MG tablet TAKE (1) TABLET BY MOUTH EVERY DAY AT 6:00 IN THE EVENING 30 tablet 12   zolpidem (AMBIEN) 10 MG tablet TAKE ONE TABLET BY MOUTH DAILY AT BEDTIME AS NEEDED FOR SLEEP. 30 tablet 5   furosemide (LASIX) 20 MG tablet Take 1 tablet (20 mg total) by mouth every other day. (Patient not taking: Reported on 03/08/2019) 30 tablet 11   ranitidine (ZANTAC) 150 MG tablet Take 150 mg by mouth 2 (two) times daily as needed for heartburn.     No current facility-administered medications for this visit.     OBJECTIVE: Vitals:   03/08/19 1103  BP: (!) 106/58  Pulse: (!) 104  Resp: 18  Temp: (!) 96.3 F (35.7 C)     Body mass index is 25.14 kg/m.    ECOG FS:1 - Symptomatic but completely ambulatory  General: Well-developed, well-nourished, no acute  distress.  Sitting in a wheelchair. Eyes: Pink conjunctiva, anicteric sclera. HEENT: Normocephalic, moist mucous membranes, clear oropharnyx. Lungs: Clear to auscultation bilaterally. Heart: Regular rate and rhythm. No rubs, murmurs, or gallops. Abdomen: Soft, nontender, nondistended. No organomegaly noted, normoactive bowel sounds. Musculoskeletal: No edema, cyanosis, or clubbing. Neuro: Alert, answering all questions appropriately. Cranial nerves grossly intact. Skin: No rashes or petechiae noted. Psych: Normal affect.  LAB RESULTS:  Lab Results  Component Value Date   NA 131 (L) 03/08/2019   K 3.6 03/08/2019   CL 97 (L) 03/08/2019   CO2 26 03/08/2019   GLUCOSE 99 03/08/2019   BUN 18 03/08/2019   CREATININE 0.74 03/08/2019   CALCIUM 8.6 (L) 03/08/2019   PROT 7.1 03/08/2019   ALBUMIN 3.3 (L) 03/08/2019   AST 21 03/08/2019   ALT 26 03/08/2019   ALKPHOS 88 03/08/2019   BILITOT 0.4 03/08/2019   GFRNONAA >60 03/08/2019   GFRAA >60 03/08/2019    Lab Results  Component Value Date   WBC 7.2 03/08/2019   NEUTROABS 5.8 03/08/2019   HGB 9.6 (L) 03/08/2019   HCT 30.4 (L) 03/08/2019   MCV 88.6 03/08/2019   PLT 209 03/08/2019     STUDIES: Mr [name] QJ Contrast  Result Date: 03/04/2019 CLINICAL DATA:  Metastatic small cell lung cancer staging EXAM: MRI HEAD WITHOUT AND WITH CONTRAST TECHNIQUE: Multiplanar, multiecho pulse sequences of the brain and surrounding structures were obtained without and with intravenous contrast. CONTRAST:  6 mL Gadovist IV COMPARISON:  MRI head 01/22/2016 FINDINGS: Brain: Multiple enhancing lesions the brain compatible with metastatic disease. 6 mm ring-enhancing lesion in the right brachium pontis 4 mm enhancing lesion left posterior temporal lobe 5 mm rim enhancing lesion left frontal cortex anteriorly 4 mm enhancing lesion left posterior central gyrus over the left convexity. Ventricle size normal. Patchy and confluent white matter hyperintensity  bilaterally have progressed in the interval which may be treatment related. No acute infarct or hemorrhage. Mild atrophy without hydrocephalus. Vascular: Normal arterial flow voids Skull and upper cervical spine: Negative Sinuses/Orbits: Paranasal sinuses clear. Bilateral mastoid effusion. Normal orbit Other: None IMPRESSION: Interval development of 4 small enhancing metastatic deposits in the brain as described above. Progressive white matter changes bilaterally likely due to treatment effect. Electronically Signed  By: Franchot Gallo M.D.   On: 03/04/2019 11:38   Ct Biopsy  Result Date: 02/10/2019 INDICATION: History of lung cancer, now with indeterminate hypermetabolic lesion involving the anterior aspect the right ilium. Please perform CT-guided biopsy for tissue diagnostic purposes. EXAM: CT-GUIDED BONE LESION BIOPSY MEDICATIONS: None ANESTHESIA/SEDATION: Fentanyl 75 mcg IV; Versed 2.5 mg IV Sedation Time: 19 Minutes; The patient was continuously monitored during the procedure by the interventional radiology nurse under my direct supervision. COMPLICATIONS: None immediate. PROCEDURE: Informed consent was obtained from the patient following an explanation of the procedure, risks, benefits and alternatives. The patient understands, agrees and consents for the procedure. All questions were addressed. A time out was performed prior to the initiation of the procedure. The patient was positioned supine and non-contrast localization CT was performed of the pelvis demonstrating unchanged size and appearance the approximately 1.7 x 1.1 cm lytic lesion involving the anterior aspect of the right ilium (image 27, series 2). The operative site was prepped and draped in the usual sterile fashion. Under sterile conditions and local anesthesia, a 22 gauge spinal needle was utilized for procedural planning. Next, an 11 gauge coaxial bone biopsy needle was advanced into the peripheral aspect of the ill-defined lytic lesion  involving the anterior aspect the right ilium. Appropriate position was confirmed and 3 biopsies were obtained with the inner 13 gauge device of the bone biopsy set (representative image 37, series 3). Appropriate positioning was confirmed and a final core needle biopsy was obtained with the outer 11 gauge coaxial bone biopsy device (representative image 46, series 3). The needle was removed intact. Superficial hemostasis was obtained with compression and a dressing was placed. The patient tolerated the procedure well without immediate post procedural complication. IMPRESSION: Successful CT guided biopsy of lytic lesion involving the anterior aspect the right ilium. Electronically Signed   By: Sandi Mariscal M.D.   On: 02/10/2019 11:50   Dg Chest Port 1 View  Result Date: 03/01/2019 CLINICAL DATA:  Status post port placement EXAM: PORTABLE CHEST 1 VIEW COMPARISON:  01/26/2019 FINDINGS: Cardiac shadow is stable. Left chest wall port is noted with the catheter tip at the cavoatrial junction. No pneumothorax is seen. Opacification of left hemithorax is noted stable from the prior PET-CT. No acute bony abnormality is noted. IMPRESSION: No pneumothorax following chest wall port placement. Stable left hemithorax opacification Electronically Signed   By: Inez Catalina M.D.   On: 03/01/2019 09:23   Dg C-arm 1-60 Min-no Report  Result Date: 03/01/2019 Fluoroscopy was utilized by the requesting physician.  No radiographic interpretation.    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  PLAN:    1. 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  PET scan results from January 26, 2019 reviewed independently with recurrent disease in her chest and a isolated metastatic lesion in the right ilium.  CT-guided biopsy of the iliac  lesion completed on February 10, 2019 confirmed metastatic disease.  After lengthy discussion with the patient, she wishes to pursue aggressive chemotherapy and will receive carboplatinum and etoposide on day 1 with etoposide on days 2 and 3.  She may require Neulasta in the future if necessary.  Patient has had her port replaced.  Patient tolerated cycle 1 of carboplatinum and etoposide last week.  Despite brain metastasis seen on MRI, will proceed with treatment  as planned.  Return to clinic in 2 weeks for further evaluation and consideration of cycle 2, day 1.   2.  Brain metastasis: MRI on March 04, 2019 reviewed independently and reported as above with 4 separate brain metastasis largest measuring 6 mm.  Patient had consultation with radiation oncology earlier today and will initiate whole brain radiation in the near future.  Continue chemotherapy as above.    3.  History of pulmonary embolism: Likely secondary to increased sedentary lifestyle.  Patient completed 6 months of Eliquis.   4. Peripheral neuropathy: Patient does not complain of this today.  Patient is no longer taking gabapentin. 5. Anemia: Patient hemoglobin slightly improved to 9.6.  Monitor. 6. Pain: Patient does not complain of pain today.  Continue Percocet as needed. 7. Anxiety: Chronic and unchanged.  Continue Xanax as needed. 8.  Nausea: Patient does not complain of this today.  Continue Phenergan as needed. 9.  Shortness of breath: Chronic and unchanged.  Continue oxygen as prescribed.  I spent a total of 30 minutes face-to-face with the patient of which greater than 50% of the visit was spent in counseling and coordination of care as detailed above.   Patient expressed understanding and was in agreement with this plan. She also understands that She can call clinic at any time with any questions, concerns, or complaints.    Lloyd Huger, MD   03/09/2019 6:51 AM

## 2019-03-08 NOTE — Progress Notes (Signed)
Patient here for follow up. Pt feeling a little nauseated today.

## 2019-03-09 ENCOUNTER — Ambulatory Visit
Admission: RE | Admit: 2019-03-09 | Discharge: 2019-03-09 | Disposition: A | Discharge status: Home / Self Care | Payer: Managed Care, Other (non HMO) | Source: Ambulatory Visit | Attending: Radiation Oncology | Admitting: Radiation Oncology

## 2019-03-09 ENCOUNTER — Other Ambulatory Visit: Payer: Self-pay

## 2019-03-09 DIAGNOSIS — Z79899 Other long term (current) drug therapy: Secondary | ICD-10-CM | POA: Diagnosis not present

## 2019-03-09 DIAGNOSIS — C7951 Secondary malignant neoplasm of bone: Secondary | ICD-10-CM | POA: Diagnosis not present

## 2019-03-09 DIAGNOSIS — Z5111 Encounter for antineoplastic chemotherapy: Secondary | ICD-10-CM | POA: Diagnosis not present

## 2019-03-09 DIAGNOSIS — R531 Weakness: Secondary | ICD-10-CM | POA: Diagnosis not present

## 2019-03-09 DIAGNOSIS — C3490 Malignant neoplasm of unspecified part of unspecified bronchus or lung: Secondary | ICD-10-CM | POA: Diagnosis not present

## 2019-03-09 DIAGNOSIS — Z7982 Long term (current) use of aspirin: Secondary | ICD-10-CM | POA: Diagnosis not present

## 2019-03-09 DIAGNOSIS — C7931 Secondary malignant neoplasm of brain: Secondary | ICD-10-CM | POA: Diagnosis not present

## 2019-03-09 DIAGNOSIS — J449 Chronic obstructive pulmonary disease, unspecified: Secondary | ICD-10-CM | POA: Diagnosis not present

## 2019-03-09 DIAGNOSIS — R5383 Other fatigue: Secondary | ICD-10-CM | POA: Diagnosis not present

## 2019-03-09 DIAGNOSIS — M545 Low back pain: Secondary | ICD-10-CM | POA: Diagnosis not present

## 2019-03-09 DIAGNOSIS — Z86711 Personal history of pulmonary embolism: Secondary | ICD-10-CM | POA: Diagnosis not present

## 2019-03-09 DIAGNOSIS — F329 Major depressive disorder, single episode, unspecified: Secondary | ICD-10-CM | POA: Diagnosis not present

## 2019-03-09 DIAGNOSIS — K219 Gastro-esophageal reflux disease without esophagitis: Secondary | ICD-10-CM | POA: Diagnosis not present

## 2019-03-09 DIAGNOSIS — E785 Hyperlipidemia, unspecified: Secondary | ICD-10-CM | POA: Diagnosis not present

## 2019-03-09 DIAGNOSIS — D649 Anemia, unspecified: Secondary | ICD-10-CM | POA: Diagnosis not present

## 2019-03-09 DIAGNOSIS — G4700 Insomnia, unspecified: Secondary | ICD-10-CM | POA: Diagnosis not present

## 2019-03-09 DIAGNOSIS — R110 Nausea: Secondary | ICD-10-CM | POA: Diagnosis not present

## 2019-03-09 DIAGNOSIS — F419 Anxiety disorder, unspecified: Secondary | ICD-10-CM | POA: Diagnosis not present

## 2019-03-09 DIAGNOSIS — M8580 Other specified disorders of bone density and structure, unspecified site: Secondary | ICD-10-CM | POA: Diagnosis not present

## 2019-03-09 DIAGNOSIS — E063 Autoimmune thyroiditis: Secondary | ICD-10-CM | POA: Diagnosis not present

## 2019-03-09 DIAGNOSIS — G629 Polyneuropathy, unspecified: Secondary | ICD-10-CM | POA: Diagnosis not present

## 2019-03-09 DIAGNOSIS — Z87891 Personal history of nicotine dependence: Secondary | ICD-10-CM | POA: Diagnosis not present

## 2019-03-09 MED ORDER — PROMETHAZINE HCL 25 MG PO TABS: 25.0000 mg | ORAL_TABLET | Freq: Four times a day (QID) | ORAL | 2 refills | Status: AC | PRN

## 2019-03-16 ENCOUNTER — Other Ambulatory Visit: Payer: Self-pay

## 2019-03-16 ENCOUNTER — Ambulatory Visit
Admission: RE | Admit: 2019-03-16 | Discharge: 2019-03-16 | Disposition: A | Discharge status: Home / Self Care | Payer: Managed Care, Other (non HMO) | Source: Ambulatory Visit | Attending: Radiation Oncology | Admitting: Radiation Oncology

## 2019-03-16 DIAGNOSIS — C7951 Secondary malignant neoplasm of bone: Secondary | ICD-10-CM | POA: Diagnosis not present

## 2019-03-17 ENCOUNTER — Other Ambulatory Visit: Payer: Self-pay

## 2019-03-17 ENCOUNTER — Ambulatory Visit
Admission: RE | Admit: 2019-03-17 | Discharge: 2019-03-17 | Disposition: A | Discharge status: Home / Self Care | Payer: Managed Care, Other (non HMO) | Source: Ambulatory Visit | Attending: Radiation Oncology | Admitting: Radiation Oncology

## 2019-03-17 DIAGNOSIS — C7951 Secondary malignant neoplasm of bone: Secondary | ICD-10-CM | POA: Diagnosis not present

## 2019-03-18 ENCOUNTER — Ambulatory Visit: Payer: Managed Care, Other (non HMO)

## 2019-03-18 ENCOUNTER — Other Ambulatory Visit: Payer: Self-pay | Admitting: *Deleted

## 2019-03-18 MED ORDER — DEXAMETHASONE 4 MG PO TABS: 4.0000 mg | ORAL_TABLET | Freq: Two times a day (BID) | ORAL | 0 refills | Status: AC

## 2019-03-21 ENCOUNTER — Ambulatory Visit
Admission: RE | Admit: 2019-03-21 | Discharge: 2019-03-21 | Disposition: A | Discharge status: Home / Self Care | Payer: Managed Care, Other (non HMO) | Source: Ambulatory Visit | Attending: Radiation Oncology | Admitting: Radiation Oncology

## 2019-03-21 ENCOUNTER — Other Ambulatory Visit: Payer: Self-pay

## 2019-03-21 DIAGNOSIS — C7951 Secondary malignant neoplasm of bone: Secondary | ICD-10-CM | POA: Diagnosis not present

## 2019-03-22 ENCOUNTER — Ambulatory Visit
Admission: RE | Admit: 2019-03-22 | Discharge: 2019-03-22 | Disposition: A | Discharge status: Home / Self Care | Payer: Managed Care, Other (non HMO) | Source: Ambulatory Visit | Attending: Radiation Oncology | Admitting: Radiation Oncology

## 2019-03-22 ENCOUNTER — Other Ambulatory Visit: Payer: Self-pay

## 2019-03-22 DIAGNOSIS — C7951 Secondary malignant neoplasm of bone: Secondary | ICD-10-CM | POA: Diagnosis not present

## 2019-03-23 ENCOUNTER — Inpatient Hospital Stay (HOSPITAL_BASED_OUTPATIENT_CLINIC_OR_DEPARTMENT_OTHER): Payer: Managed Care, Other (non HMO) | Admitting: Oncology

## 2019-03-23 ENCOUNTER — Other Ambulatory Visit: Payer: Self-pay

## 2019-03-23 ENCOUNTER — Inpatient Hospital Stay: Payer: Managed Care, Other (non HMO) | Attending: Oncology

## 2019-03-23 ENCOUNTER — Encounter: Payer: Self-pay | Admitting: Oncology

## 2019-03-23 ENCOUNTER — Inpatient Hospital Stay: Payer: Managed Care, Other (non HMO)

## 2019-03-23 ENCOUNTER — Ambulatory Visit
Admission: RE | Admit: 2019-03-23 | Discharge: 2019-03-23 | Disposition: A | Discharge status: Home / Self Care | Payer: Managed Care, Other (non HMO) | Source: Ambulatory Visit | Attending: Radiation Oncology | Admitting: Radiation Oncology

## 2019-03-23 VITALS — BP 144/79 | HR 88 | Temp 97.6°F | Resp 20

## 2019-03-23 VITALS — BP 120/74 | HR 89 | Temp 96.7°F | Wt 141.0 lb

## 2019-03-23 DIAGNOSIS — G4700 Insomnia, unspecified: Secondary | ICD-10-CM | POA: Insufficient documentation

## 2019-03-23 DIAGNOSIS — M8580 Other specified disorders of bone density and structure, unspecified site: Secondary | ICD-10-CM

## 2019-03-23 DIAGNOSIS — Z7982 Long term (current) use of aspirin: Secondary | ICD-10-CM

## 2019-03-23 DIAGNOSIS — R531 Weakness: Secondary | ICD-10-CM

## 2019-03-23 DIAGNOSIS — C3490 Malignant neoplasm of unspecified part of unspecified bronchus or lung: Secondary | ICD-10-CM | POA: Diagnosis not present

## 2019-03-23 DIAGNOSIS — C7931 Secondary malignant neoplasm of brain: Secondary | ICD-10-CM | POA: Diagnosis not present

## 2019-03-23 DIAGNOSIS — E785 Hyperlipidemia, unspecified: Secondary | ICD-10-CM

## 2019-03-23 DIAGNOSIS — F329 Major depressive disorder, single episode, unspecified: Secondary | ICD-10-CM

## 2019-03-23 DIAGNOSIS — Z5111 Encounter for antineoplastic chemotherapy: Secondary | ICD-10-CM | POA: Diagnosis not present

## 2019-03-23 DIAGNOSIS — G629 Polyneuropathy, unspecified: Secondary | ICD-10-CM

## 2019-03-23 DIAGNOSIS — C3432 Malignant neoplasm of lower lobe, left bronchus or lung: Secondary | ICD-10-CM

## 2019-03-23 DIAGNOSIS — C7951 Secondary malignant neoplasm of bone: Secondary | ICD-10-CM

## 2019-03-23 DIAGNOSIS — J449 Chronic obstructive pulmonary disease, unspecified: Secondary | ICD-10-CM

## 2019-03-23 DIAGNOSIS — E063 Autoimmune thyroiditis: Secondary | ICD-10-CM | POA: Insufficient documentation

## 2019-03-23 DIAGNOSIS — D649 Anemia, unspecified: Secondary | ICD-10-CM

## 2019-03-23 DIAGNOSIS — K219 Gastro-esophageal reflux disease without esophagitis: Secondary | ICD-10-CM

## 2019-03-23 DIAGNOSIS — R5383 Other fatigue: Secondary | ICD-10-CM | POA: Insufficient documentation

## 2019-03-23 DIAGNOSIS — Z87891 Personal history of nicotine dependence: Secondary | ICD-10-CM | POA: Insufficient documentation

## 2019-03-23 DIAGNOSIS — Z95828 Presence of other vascular implants and grafts: Secondary | ICD-10-CM

## 2019-03-23 DIAGNOSIS — Z86711 Personal history of pulmonary embolism: Secondary | ICD-10-CM

## 2019-03-23 DIAGNOSIS — R110 Nausea: Secondary | ICD-10-CM | POA: Insufficient documentation

## 2019-03-23 DIAGNOSIS — M545 Low back pain: Secondary | ICD-10-CM

## 2019-03-23 DIAGNOSIS — Z79899 Other long term (current) drug therapy: Secondary | ICD-10-CM | POA: Insufficient documentation

## 2019-03-23 DIAGNOSIS — F419 Anxiety disorder, unspecified: Secondary | ICD-10-CM

## 2019-03-23 LAB — COMPREHENSIVE METABOLIC PANEL
ALT: 29 U/L (ref 0–44)
AST: 31 U/L (ref 15–41)
Albumin: 2.9 g/dL — ABNORMAL LOW (ref 3.5–5.0)
Alkaline Phosphatase: 104 U/L (ref 38–126)
Anion gap: 7 (ref 5–15)
BUN: 12 mg/dL (ref 8–23)
CO2: 28 mmol/L (ref 22–32)
Calcium: 8.4 mg/dL — ABNORMAL LOW (ref 8.9–10.3)
Chloride: 98 mmol/L (ref 98–111)
Creatinine, Ser: 0.65 mg/dL (ref 0.44–1.00)
GFR calc Af Amer: >60 mL/min (ref >60)
GFR calc non Af Amer: >60 mL/min (ref >60)
Glucose, Bld: 179 mg/dL — ABNORMAL HIGH (ref 70–99)
Potassium: 3.3 mmol/L — ABNORMAL LOW (ref 3.5–5.1)
Sodium: 133 mmol/L — ABNORMAL LOW (ref 135–145)
Total Bilirubin: 0.2 mg/dL — ABNORMAL LOW (ref 0.3–1.2)
Total Protein: 6.3 g/dL — ABNORMAL LOW (ref 6.5–8.1)

## 2019-03-23 LAB — CBC WITH DIFFERENTIAL/PLATELET
Abs Immature Granulocytes: 0.44 10*3/uL — ABNORMAL HIGH (ref 0.00–0.07)
Basophils Absolute: 0 10*3/uL (ref 0.0–0.1)
Basophils Relative: 0 %
Eosinophils Absolute: 0 10*3/uL (ref 0.0–0.5)
Eosinophils Relative: 0 %
HCT: 23 % — ABNORMAL LOW (ref 36.0–46.0)
Hemoglobin: 7.3 g/dL — ABNORMAL LOW (ref 12.0–15.0)
Immature Granulocytes: 6 %
Lymphocytes Relative: 12 %
Lymphs Abs: 0.8 10*3/uL (ref 0.7–4.0)
MCH: 28.3 pg (ref 26.0–34.0)
MCHC: 31.7 g/dL (ref 30.0–36.0)
MCV: 89.1 fL (ref 80.0–100.0)
Monocytes Absolute: 0.7 10*3/uL (ref 0.1–1.0)
Monocytes Relative: 10 %
Neutro Abs: 5.2 10*3/uL (ref 1.7–7.7)
Neutrophils Relative %: 72 %
Platelets: 248 10*3/uL (ref 150–400)
RBC: 2.58 MIL/uL — ABNORMAL LOW (ref 3.87–5.11)
RDW: 14.8 % (ref 11.5–15.5)
Smear Review: NORMAL
WBC: 7.2 10*3/uL (ref 4.0–10.5)
nRBC: 0.6 % — ABNORMAL HIGH (ref 0.0–0.2)

## 2019-03-23 MED ORDER — SODIUM CHLORIDE 0.9 % IV SOLN
514.5000 mg | Freq: Once | INTRAVENOUS | Status: AC
  Administered 2019-03-23: 510 mg via INTRAVENOUS
  Filled 2019-03-23: qty 51

## 2019-03-23 MED ORDER — SODIUM CHLORIDE 0.9 % IV SOLN
Freq: Once | INTRAVENOUS | Status: AC
  Administered 2019-03-23: 10:00:00 via INTRAVENOUS
  Filled 2019-03-23: qty 250

## 2019-03-23 MED ORDER — DEXAMETHASONE SODIUM PHOSPHATE 10 MG/ML IJ SOLN
10.0000 mg | Freq: Once | INTRAMUSCULAR | Status: AC
  Administered 2019-03-23: 10 mg via INTRAVENOUS
  Filled 2019-03-23: qty 1

## 2019-03-23 MED ORDER — PALONOSETRON HCL INJECTION 0.25 MG/5ML
0.2500 mg | Freq: Once | INTRAVENOUS | Status: AC
  Administered 2019-03-23: 0.25 mg via INTRAVENOUS
  Filled 2019-03-23: qty 5

## 2019-03-23 MED ORDER — HEPARIN SOD (PORK) LOCK FLUSH 100 UNIT/ML IV SOLN
500.0000 [IU] | Freq: Once | INTRAVENOUS | Status: AC | PRN
  Administered 2019-03-23: 500 [IU]
  Filled 2019-03-23: qty 5

## 2019-03-23 MED ORDER — SODIUM CHLORIDE 0.9 % IV SOLN
80.0000 mg/m2 | Freq: Once | INTRAVENOUS | Status: AC
  Administered 2019-03-23: 140 mg via INTRAVENOUS
  Filled 2019-03-23: qty 7

## 2019-03-23 MED ORDER — SODIUM CHLORIDE 0.9% FLUSH
10.0000 mL | Freq: Once | INTRAVENOUS | Status: AC
  Administered 2019-03-23: 09:00:00 10 mL via INTRAVENOUS
  Filled 2019-03-23: qty 10

## 2019-03-23 NOTE — Progress Notes (Signed)
hgb 7.3, ok to proceed per MD

## 2019-03-23 NOTE — Progress Notes (Signed)
Patient stated that she had been doing well with no complaints. 

## 2019-03-23 NOTE — Progress Notes (Signed)
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  INTERVAL HISTORY: Patient returns to clinic today for further evaluation and consideration of cycle 2, day 1 of carboplatin and etoposide. She is anxious, but otherwise feels well.  She continues to have chronic weakness and fatigue.  She continues to have chronic shortness of breath and requires oxygen 24 hours/day. She does not complain of increased cough, hemoptysis, or chest pain.  She has no neurologic complaints.  She denies any recent fevers or illnesses.  She has a fair appetite and denies weight loss.  She denies any nausea, vomiting, constipation, or diarrhea. She has no urinary complaints. Patient offers no further specific complaints.  REVIEW OF SYSTEMS:   Review of Systems  Constitutional: Positive for malaise/fatigue. Negative for fever and weight loss.  HENT: Negative for sore throat.   Eyes: Negative.   Respiratory: Positive for shortness of breath. Negative for cough and hemoptysis.   Cardiovascular: Negative.  Negative for chest pain and leg swelling.  Gastrointestinal: Negative for nausea and vomiting.  Genitourinary: Negative.  Negative for dysuria.  Musculoskeletal: Negative.  Negative for back pain.  Skin: Negative.   Neurological: Positive for weakness. Negative for dizziness, tingling, sensory change and focal weakness.  Psychiatric/Behavioral: Negative for depression. The patient is nervous/anxious.     As per HPI. Otherwise, a complete review of  systems is negative.  PAST MEDICAL HISTORY: Past Medical History:  Diagnosis Date   Allergy    Anemia    pernicious   Anxiety    Arthritis    Atrophic vaginitis    Back pain    COPD (chronic obstructive pulmonary disease) (HCC)    Depression    Diverticulosis    Dyspnea    Family history of adverse reaction to anesthesia    MOM-BUT UNSURE WHAT REACTION WAS   Fatigue    GERD (gastroesophageal reflux disease)    H/O degenerative disc disease    Hashimoto's disease    History of chemotherapy 04/2016   Hyperlipidemia    Hypothyroid    Insomnia    Low back pain    Lumbago    Neuropathy due to chemotherapeutic drug ([place])    Osteopenia    Pneumonia 11/2016   Small cell carcinoma of left lung ([place]) 01/2016   rad + chemo tx's   Tobacco use    Vitamin B12 deficiency     PAST SURGICAL HISTORY: Past Surgical History:  Procedure Laterality Date   ABDOMINAL HYSTERECTOMY     ANKLE SURGERY Left    x4   CARPAL TUNNEL RELEASE Right 03/2014   CHOLECYSTECTOMY     ENDOBRONCHIAL ULTRASOUND N/A 01/07/2016   Procedure: ENDOBRONCHIAL ULTRASOUND;  Surgeon: Vilinda Boehringer, MD;  Location: [facility];  Service: Cardiopulmonary;  Laterality: N/A;   ENDOBRONCHIAL ULTRASOUND N/A 09/16/2016   Procedure: ENDOBRONCHIAL ULTRASOUND;  Surgeon: Vilinda Boehringer, MD;  Location: [facility];  Service: Cardiopulmonary;  Laterality: N/A;   ENDOBRONCHIAL ULTRASOUND N/A 07/02/2017   Procedure: ENDOBRONCHIAL ULTRASOUND;  Surgeon: Laverle Hobby, MD;  Location: [facility];  Service: Pulmonary;  Laterality: N/A;   HERNIA REPAIR Left  inguinal   PORT-A-CATH REMOVAL N/A 09/08/2017   Procedure: REMOVAL PORT-A-CATH;  Surgeon: [name], MD;  Location: [facility];  Service: General;  Laterality: N/A;   PORTACATH PLACEMENT Right 01/23/2016   Procedure: INSERTION PORT-A-CATH;  Surgeon: [name], MD;  Location: [facility];  Service: General;  Laterality: Right;   PORTACATH  PLACEMENT N/A 03/01/2019   Procedure: INSERTION PORT-A-CATH;  Surgeon: [name], MD;  Location: [facility];  Service: General;  Laterality: N/A;    FAMILY HISTORY Family History  Problem Relation Age of Onset   Emphysema Mother    Diabetes Father        ADVANCED DIRECTIVES:    HEALTH MAINTENANCE: Social History   Tobacco Use   Smoking status: Former Smoker    Packs/day: 1.00    Years: 30.00    Pack years: 30.00    Types: Cigarettes    Last attempt to quit: 01/11/2016    Years since quitting: 3.1   Smokeless tobacco: Never Used  Substance Use Topics   Alcohol use: No    Alcohol/week: 0.0 standard drinks   Drug use: No     Allergies  Allergen Reactions   Macrobid [Nitrofurantoin] Anaphylaxis   Effexor [Venlafaxine] Other (See Comments)    UNKNOWN   Hctz [Hydrochlorothiazide] Other (See Comments)    cramps   Neurontin [Gabapentin] Other (See Comments)    UNKNOWN    Paxil [Paroxetine Hcl] Other (See Comments)    UNKNOWN    Pravachol [Pravastatin Sodium] Other (See Comments)    aching   Prozac [Fluoxetine Hcl] Other (See Comments)    UNKNOWN    Sulfur Itching and Other (See Comments)    "blisters in mouth"   Wellbutrin [Bupropion] Itching   Zoloft [Sertraline Hcl] Other (See Comments)    UNKNOWN     Current Outpatient Medications  Medication Sig Dispense Refill   acetaminophen (TYLENOL) 500 MG tablet Take 1,000 mg by mouth daily as needed for mild pain.     albuterol (PROVENTIL HFA;VENTOLIN HFA) 108 (90 Base) MCG/ACT inhaler Inhale 2 puffs into the lungs every 6 (six) hours as needed for wheezing or shortness of breath. 1 Inhaler 2   ALPRAZolam (XANAX) 0.25 MG tablet Take 1 tablet (0.25 mg total) by mouth at bedtime as needed for anxiety. 30 tablet 0   amitriptyline (ELAVIL) 25 MG tablet TAKE 1-2 TABLETS BY MOUTH AT BEDTIME. 60 tablet 5   aspirin EC 81 MG tablet Take 81 mg by mouth daily.     cyanocobalamin (,VITAMIN B-12,) 1000 MCG/ML  injection INJECT 1 MILLILITER INTO THE MUSCLE ONCE 10 mL 1   dexamethasone (DECADRON) 4 MG tablet Take 1 tablet (4 mg total) by mouth 2 (two) times daily with a meal. 30 tablet 0   furosemide (LASIX) 20 MG tablet Take 1 tablet (20 mg total) by mouth every other day. 30 tablet 11   ipratropium-albuterol (DUONEB) 0.5-2.5 (3) MG/3ML SOLN Take 3 mLs by nebulization every 6 (six) hours as needed. 360 mL 0   levothyroxine (SYNTHROID, LEVOTHROID) 112 MCG tablet TAKE 1 TABLET ON AN EMPTY STOMACH WITH A GLASS OF WATER AT LEAST 30 TO 60 MINUTES BEFORE BREAKFAST. 30 tablet 5   lidocaine-prilocaine (EMLA) cream Apply to affected area once 30 g 3   omeprazole (PRILOSEC) 20 MG capsule Take 20 mg by mouth daily.     ondansetron (ZOFRAN) 8 MG tablet Take 1 tablet (8 mg total) by mouth 2 (two) times daily as needed for refractory nausea / vomiting. [place]  tablet 2   potassium chloride (K-DUR) 10 MEQ tablet Take 1 tablet (10 mEq total) by mouth every other day. 30 tablet 11   prochlorperazine (COMPAZINE) 10 MG tablet Take 1 tablet (10 mg total) by mouth every 6 (six) hours as needed (Nausea or vomiting). 60 tablet 2   promethazine (PHENERGAN) 25 MG tablet Take 1 tablet (25 mg total) by mouth every 6 (six) hours as needed for nausea or vomiting. 60 tablet 2   ranitidine (ZANTAC) 150 MG tablet Take 150 mg by mouth 2 (two) times daily as needed for heartburn.     simvastatin (ZOCOR) 10 MG tablet TAKE (1) TABLET BY MOUTH EVERY DAY AT 6:00 IN THE EVENING 30 tablet 12   zolpidem (AMBIEN) 10 MG tablet TAKE ONE TABLET BY MOUTH DAILY AT BEDTIME AS NEEDED FOR SLEEP. 30 tablet 5   No current facility-administered medications for this visit.     OBJECTIVE: Vitals:   03/23/19 0920  BP: 120/74  Pulse: 89  Temp: (!) 96.7 F (35.9 C)     Body mass index is 24.98 kg/m.    ECOG FS:1 - Symptomatic but completely ambulatory  General: Well-developed, well-nourished, no acute distress. Sitting in a wheelchair. Eyes:  Pink conjunctiva, anicteric sclera. HEENT: Normocephalic, moist mucous membranes. Lungs: Diminished breath sounds on left. Heart: Regular rate and rhythm. No rubs, murmurs, or gallops. Abdomen: Soft, nontender, nondistended. No organomegaly noted, normoactive bowel sounds. Musculoskeletal: No edema, cyanosis, or clubbing. Neuro: Alert, answering all questions appropriately. Cranial nerves grossly intact. Skin: No rashes or petechiae noted. Psych: Normal affect.  LAB RESULTS:  Lab Results  Component Value Date   NA 133 (L) 03/23/2019   K 3.3 (L) 03/23/2019   CL 98 03/23/2019   CO2 28 03/23/2019   GLUCOSE 179 (H) 03/23/2019   BUN 12 03/23/2019   CREATININE 0.65 03/23/2019   CALCIUM 8.4 (L) 03/23/2019   PROT 6.3 (L) 03/23/2019   ALBUMIN 2.9 (L) 03/23/2019   AST 31 03/23/2019   ALT 29 03/23/2019   ALKPHOS 104 03/23/2019   BILITOT 0.2 (L) 03/23/2019   GFRNONAA >60 03/23/2019   GFRAA >60 03/23/2019    Lab Results  Component Value Date   WBC 7.2 03/23/2019   NEUTROABS 5.2 03/23/2019   HGB 7.3 (L) 03/23/2019   HCT 23.0 (L) 03/23/2019   MCV 89.1 03/23/2019   PLT 248 03/23/2019     STUDIES: Mr [name] KG Contrast  Result Date: 03/04/2019 CLINICAL DATA:  Metastatic small cell lung cancer staging EXAM: MRI HEAD WITHOUT AND WITH CONTRAST TECHNIQUE: Multiplanar, multiecho pulse sequences of the brain and surrounding structures were obtained without and with intravenous contrast. CONTRAST:  6 mL Gadovist IV COMPARISON:  MRI head 01/22/2016 FINDINGS: Brain: Multiple enhancing lesions the brain compatible with metastatic disease. 6 mm ring-enhancing lesion in the right brachium pontis 4 mm enhancing lesion left posterior temporal lobe 5 mm rim enhancing lesion left frontal cortex anteriorly 4 mm enhancing lesion left posterior central gyrus over the left convexity. Ventricle size normal. Patchy and confluent white matter hyperintensity bilaterally have progressed in the interval which  may be treatment related. No acute infarct or hemorrhage. Mild atrophy without hydrocephalus. Vascular: Normal arterial flow voids Skull and upper cervical spine: Negative Sinuses/Orbits: Paranasal sinuses clear. Bilateral mastoid effusion. Normal orbit Other: None IMPRESSION: Interval development of 4 small enhancing metastatic deposits in the brain as described above. Progressive white matter changes bilaterally likely due to treatment effect. Electronically Signed   By: Juanda Crumble  Carlis Abbott M.D.   On: 03/04/2019 11:38   Dg Chest Port 1 View  Result Date: 03/01/2019 CLINICAL DATA:  Status post port placement EXAM: PORTABLE CHEST 1 VIEW COMPARISON:  01/26/2019 FINDINGS: Cardiac shadow is stable. Left chest wall port is noted with the catheter tip at the cavoatrial junction. No pneumothorax is seen. Opacification of left hemithorax is noted stable from the prior PET-CT. No acute bony abnormality is noted. IMPRESSION: No pneumothorax following chest wall port placement. Stable left hemithorax opacification Electronically Signed   By: Inez Catalina M.D.   On: 03/01/2019 09:23   Dg C-arm 1-60 Min-no Report  Result Date: 03/01/2019 Fluoroscopy was utilized by the requesting physician.  No radiographic interpretation.    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  PLAN:    1. 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  PET scan results from January 26, 2019 reviewed independently with recurrent disease in her chest and a isolated metastatic lesion in the right ilium.  CT-guided biopsy of the iliac lesion completed on February 10, 2019 confirmed metastatic disease.  After lengthy discussion with the patient, she wishes to pursue aggressive chemotherapy and will receive carboplatinum and etoposide on day 1 with etoposide on  days 2 and 3. Plan to add 1200 mg IV atezolizumab for cycle 3. She may require Neulasta in the future if necessary.  Patient has had her port replaced.  Proceed with cycle 2, day 1 of treatment today.  Return to clinic in 1 and 2 days for etoposide only and then in 3 weeks for consideration of cycle 3.  2.  Brain metastasis: MRI on March 04, 2019 reviewed independently and reported as above with 4 separate brain metastasis largest measuring 6 mm.  Continue daily XRT as scheduled completing on April 04, 2019.  Continue chemotherapy as above.    3.  History of pulmonary embolism: Likely secondary to increased sedentary lifestyle.  Patient completed 6 months of Eliquis.   4. Peripheral neuropathy: Patient does not complain of this today.  Patient is no longer taking gabapentin. 5. Anemia: Hemoglobin is trending sown and is now 7.3, return to clinic in 1 week for lab work and consideration of blood transfusion if needed. 6. Pain: Patient does not complain of pain today.  Continue Percocet as needed. 7. Anxiety: Chronic and unchanged.  Continue Xanax as needed. 8.  Nausea: Patient does not complain of this today.  Continue Phenergan as needed. 9.  Shortness of breath: Chronic and unchanged.  Continue oxygen as prescribed.  Patient expressed understanding and was in agreement with this plan. She also understands that She can call clinic at any time with any questions, concerns, or complaints.    Lloyd Huger, MD   03/23/2019 4:09 PM

## 2019-03-24 ENCOUNTER — Inpatient Hospital Stay: Payer: Managed Care, Other (non HMO)

## 2019-03-24 ENCOUNTER — Ambulatory Visit
Admission: RE | Admit: 2019-03-24 | Discharge: 2019-03-24 | Disposition: A | Discharge status: Home / Self Care | Payer: Managed Care, Other (non HMO) | Source: Ambulatory Visit | Attending: Radiation Oncology | Admitting: Radiation Oncology

## 2019-03-24 ENCOUNTER — Other Ambulatory Visit: Payer: Self-pay

## 2019-03-24 VITALS — BP 143/73 | HR 81 | Temp 98.2°F | Resp 20

## 2019-03-24 DIAGNOSIS — C3432 Malignant neoplasm of lower lobe, left bronchus or lung: Secondary | ICD-10-CM

## 2019-03-24 DIAGNOSIS — C7951 Secondary malignant neoplasm of bone: Secondary | ICD-10-CM

## 2019-03-24 MED ORDER — DEXAMETHASONE SODIUM PHOSPHATE 10 MG/ML IJ SOLN
10.0000 mg | Freq: Once | INTRAMUSCULAR | Status: AC
  Administered 2019-03-24: 10 mg via INTRAVENOUS
  Filled 2019-03-24: qty 1

## 2019-03-24 MED ORDER — SODIUM CHLORIDE 0.9 % IV SOLN
Freq: Once | INTRAVENOUS | Status: AC
  Administered 2019-03-24: 14:00:00 via INTRAVENOUS
  Filled 2019-03-24: qty 250

## 2019-03-24 MED ORDER — HEPARIN SOD (PORK) LOCK FLUSH 100 UNIT/ML IV SOLN
500.0000 [IU] | Freq: Once | INTRAVENOUS | Status: AC
  Administered 2019-03-24: 500 [IU] via INTRAVENOUS
  Filled 2019-03-24: qty 5

## 2019-03-24 MED ORDER — SODIUM CHLORIDE 0.9% FLUSH
10.0000 mL | INTRAVENOUS | Status: DC | PRN
  Administered 2019-03-24: 10 mL via INTRAVENOUS
  Filled 2019-03-24: qty 10

## 2019-03-24 MED ORDER — SODIUM CHLORIDE 0.9 % IV SOLN
80.0000 mg/m2 | Freq: Once | INTRAVENOUS | Status: AC
  Administered 2019-03-24: 140 mg via INTRAVENOUS
  Filled 2019-03-24: qty 7

## 2019-03-25 ENCOUNTER — Ambulatory Visit
Admission: RE | Admit: 2019-03-25 | Discharge: 2019-03-25 | Disposition: A | Discharge status: Home / Self Care | Payer: Managed Care, Other (non HMO) | Source: Ambulatory Visit | Attending: Radiation Oncology | Admitting: Radiation Oncology

## 2019-03-25 ENCOUNTER — Other Ambulatory Visit: Payer: Self-pay | Admitting: Oncology

## 2019-03-25 ENCOUNTER — Other Ambulatory Visit: Payer: Self-pay

## 2019-03-25 ENCOUNTER — Inpatient Hospital Stay: Payer: Managed Care, Other (non HMO)

## 2019-03-25 VITALS — BP 108/67 | HR 92 | Temp 96.7°F | Resp 20

## 2019-03-25 DIAGNOSIS — C7951 Secondary malignant neoplasm of bone: Secondary | ICD-10-CM | POA: Diagnosis not present

## 2019-03-25 DIAGNOSIS — C3432 Malignant neoplasm of lower lobe, left bronchus or lung: Secondary | ICD-10-CM

## 2019-03-25 MED ORDER — HEPARIN SOD (PORK) LOCK FLUSH 100 UNIT/ML IV SOLN
500.0000 [IU] | Freq: Once | INTRAVENOUS | Status: AC | PRN
  Administered 2019-03-25: 500 [IU]
  Filled 2019-03-25: qty 5

## 2019-03-25 MED ORDER — SODIUM CHLORIDE 0.9 % IV SOLN
80.0000 mg/m2 | Freq: Once | INTRAVENOUS | Status: AC
  Administered 2019-03-25: 140 mg via INTRAVENOUS
  Filled 2019-03-25: qty 7

## 2019-03-25 MED ORDER — DEXAMETHASONE SODIUM PHOSPHATE 10 MG/ML IJ SOLN
10.0000 mg | Freq: Once | INTRAMUSCULAR | Status: AC
  Administered 2019-03-25: 10 mg via INTRAVENOUS
  Filled 2019-03-25: qty 1

## 2019-03-25 MED ORDER — SODIUM CHLORIDE 0.9% FLUSH
10.0000 mL | INTRAVENOUS | Status: DC | PRN
  Administered 2019-03-25: 10 mL
  Filled 2019-03-25: qty 10

## 2019-03-25 MED ORDER — SODIUM CHLORIDE 0.9 % IV SOLN
Freq: Once | INTRAVENOUS | Status: AC
  Administered 2019-03-25: 14:00:00 via INTRAVENOUS
  Filled 2019-03-25: qty 250

## 2019-03-28 ENCOUNTER — Other Ambulatory Visit: Payer: Self-pay

## 2019-03-28 ENCOUNTER — Ambulatory Visit: Payer: Managed Care, Other (non HMO)

## 2019-03-28 ENCOUNTER — Ambulatory Visit
Admission: RE | Admit: 2019-03-28 | Discharge: 2019-03-28 | Disposition: A | Discharge status: Home / Self Care | Payer: Managed Care, Other (non HMO) | Source: Ambulatory Visit | Attending: Radiation Oncology | Admitting: Radiation Oncology

## 2019-03-28 DIAGNOSIS — C7951 Secondary malignant neoplasm of bone: Secondary | ICD-10-CM | POA: Diagnosis not present

## 2019-03-29 ENCOUNTER — Ambulatory Visit: Payer: Managed Care, Other (non HMO)

## 2019-03-29 ENCOUNTER — Telehealth: Payer: Self-pay | Admitting: *Deleted

## 2019-03-29 NOTE — Telephone Encounter (Signed)
Patients husband called stating that patient is dizzy and weak. Requesting a call back.  Secure chat message also sent.

## 2019-03-29 NOTE — Telephone Encounter (Signed)
Spoke to patient's husband. Patient reporting UTI-like symptoms. She is scheduled to have labs, blood transfusion, and brain radiation tomorrow. Will send inbasket for patient to be seen in [facility] tomorrow for evaluation & management. Patient's husband available by phone to participate in visit given COVID 19 visitor restrictions and his phone number verified as correct.

## 2019-03-30 ENCOUNTER — Ambulatory Visit: Payer: Managed Care, Other (non HMO)

## 2019-03-30 ENCOUNTER — Inpatient Hospital Stay: Payer: Managed Care, Other (non HMO) | Admitting: Oncology

## 2019-03-30 ENCOUNTER — Inpatient Hospital Stay: Payer: Managed Care, Other (non HMO)

## 2019-03-31 ENCOUNTER — Ambulatory Visit: Payer: Managed Care, Other (non HMO)

## 2019-04-01 ENCOUNTER — Ambulatory Visit: Payer: Managed Care, Other (non HMO)

## 2019-04-01 ENCOUNTER — Other Ambulatory Visit: Payer: Self-pay | Admitting: Oncology

## 2019-04-04 ENCOUNTER — Ambulatory Visit: Payer: Managed Care, Other (non HMO)

## 2019-04-05 ENCOUNTER — Ambulatory Visit: Payer: Managed Care, Other (non HMO)

## 2019-04-06 ENCOUNTER — Ambulatory Visit: Payer: Managed Care, Other (non HMO)

## 2019-04-08 DEATH — deceased

## 2019-04-13 ENCOUNTER — Other Ambulatory Visit: Payer: Managed Care, Other (non HMO)

## 2019-04-13 ENCOUNTER — Ambulatory Visit: Payer: Managed Care, Other (non HMO)

## 2019-04-13 ENCOUNTER — Ambulatory Visit: Payer: Managed Care, Other (non HMO) | Admitting: Oncology

## 2019-04-14 ENCOUNTER — Ambulatory Visit: Payer: Managed Care, Other (non HMO)

## 2019-04-15 ENCOUNTER — Ambulatory Visit: Payer: Managed Care, Other (non HMO)

## 2019-04-20 ENCOUNTER — Encounter: Payer: Managed Care, Other (non HMO) | Admitting: Family Medicine

## 2019-08-04 ENCOUNTER — Ambulatory Visit: Payer: Managed Care, Other (non HMO) | Admitting: Radiation Oncology
